# Patient Record
Sex: Female | Born: 1975 | Race: White | Hispanic: No | Marital: Single | Smoking: Never smoker
Health system: Southern US, Community
[De-identification: ages and names within clinical notes are randomized; demographics above are authoritative.]

## PROBLEM LIST (undated history)

## (undated) ENCOUNTER — Inpatient Hospital Stay (HOSPITAL_COMMUNITY): Payer: Self-pay

## (undated) DIAGNOSIS — N200 Calculus of kidney: Secondary | ICD-10-CM

## (undated) DIAGNOSIS — R3915 Urgency of urination: Secondary | ICD-10-CM

## (undated) DIAGNOSIS — E876 Hypokalemia: Secondary | ICD-10-CM

## (undated) DIAGNOSIS — Z8619 Personal history of other infectious and parasitic diseases: Secondary | ICD-10-CM

## (undated) DIAGNOSIS — R112 Nausea with vomiting, unspecified: Secondary | ICD-10-CM

## (undated) DIAGNOSIS — D649 Anemia, unspecified: Secondary | ICD-10-CM

## (undated) DIAGNOSIS — E282 Polycystic ovarian syndrome: Secondary | ICD-10-CM

## (undated) DIAGNOSIS — G43909 Migraine, unspecified, not intractable, without status migrainosus: Secondary | ICD-10-CM

## (undated) DIAGNOSIS — N201 Calculus of ureter: Secondary | ICD-10-CM

## (undated) DIAGNOSIS — F419 Anxiety disorder, unspecified: Secondary | ICD-10-CM

## (undated) DIAGNOSIS — N2589 Other disorders resulting from impaired renal tubular function: Secondary | ICD-10-CM

## (undated) DIAGNOSIS — Z87442 Personal history of urinary calculi: Secondary | ICD-10-CM

## (undated) DIAGNOSIS — R319 Hematuria, unspecified: Secondary | ICD-10-CM

## (undated) DIAGNOSIS — J189 Pneumonia, unspecified organism: Secondary | ICD-10-CM

## (undated) DIAGNOSIS — L309 Dermatitis, unspecified: Secondary | ICD-10-CM

## (undated) DIAGNOSIS — Z9889 Other specified postprocedural states: Secondary | ICD-10-CM

## (undated) DIAGNOSIS — J302 Other seasonal allergic rhinitis: Secondary | ICD-10-CM

## (undated) DIAGNOSIS — F32A Depression, unspecified: Secondary | ICD-10-CM

## (undated) HISTORY — PX: TONSILLECTOMY: SUR1361

## (undated) HISTORY — PX: CYSTOSCOPY/RETROGRADE/URETEROSCOPY/STONE EXTRACTION WITH BASKET: SHX5317

## (undated) HISTORY — DX: Depression, unspecified: F32.A

## (undated) HISTORY — DX: Personal history of other infectious and parasitic diseases: Z86.19

## (undated) HISTORY — PX: URETEROSCOPY: SHX842

---
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
## 1997-10-28 ENCOUNTER — Other Ambulatory Visit: Admission: RE | Admit: 1997-10-28 | Discharge: 1997-10-28 | Payer: Self-pay | Admitting: Obstetrics and Gynecology

## 2000-12-04 ENCOUNTER — Encounter: Admission: RE | Admit: 2000-12-04 | Discharge: 2000-12-04 | Payer: Self-pay | Admitting: Family Medicine

## 2000-12-04 ENCOUNTER — Encounter: Payer: Self-pay | Admitting: Family Medicine

## 2004-03-13 ENCOUNTER — Emergency Department (HOSPITAL_COMMUNITY): Admission: EM | Admit: 2004-03-13 | Discharge: 2004-03-13 | Payer: Self-pay | Admitting: *Deleted

## 2005-08-31 ENCOUNTER — Emergency Department (HOSPITAL_COMMUNITY): Admission: EM | Admit: 2005-08-31 | Discharge: 2005-08-31 | Payer: Self-pay | Admitting: Emergency Medicine

## 2005-09-23 ENCOUNTER — Emergency Department (HOSPITAL_COMMUNITY): Admission: EM | Admit: 2005-09-23 | Discharge: 2005-09-24 | Payer: Self-pay | Admitting: Emergency Medicine

## 2006-04-08 ENCOUNTER — Emergency Department (HOSPITAL_COMMUNITY): Admission: EM | Admit: 2006-04-08 | Discharge: 2006-04-09 | Payer: Self-pay | Admitting: Emergency Medicine

## 2006-10-29 ENCOUNTER — Encounter: Admission: RE | Admit: 2006-10-29 | Discharge: 2006-10-29 | Payer: Self-pay | Admitting: Family Medicine

## 2007-07-24 ENCOUNTER — Emergency Department (HOSPITAL_COMMUNITY): Admission: EM | Admit: 2007-07-24 | Discharge: 2007-07-25 | Payer: Self-pay | Admitting: Emergency Medicine

## 2007-08-10 ENCOUNTER — Emergency Department (HOSPITAL_COMMUNITY): Admission: EM | Admit: 2007-08-10 | Discharge: 2007-08-10 | Payer: Self-pay | Admitting: Family Medicine

## 2009-09-17 ENCOUNTER — Ambulatory Visit (HOSPITAL_COMMUNITY): Admission: RE | Admit: 2009-09-17 | Discharge: 2009-09-17 | Payer: Self-pay | Admitting: Obstetrics

## 2010-11-02 ENCOUNTER — Emergency Department (HOSPITAL_COMMUNITY)
Admission: EM | Admit: 2010-11-02 | Discharge: 2010-11-02 | Disposition: A | Discharge status: Home / Self Care | Payer: Commercial Managed Care - PPO | Attending: Emergency Medicine | Admitting: Emergency Medicine

## 2010-11-02 ENCOUNTER — Emergency Department (HOSPITAL_COMMUNITY): Payer: Commercial Managed Care - PPO

## 2010-11-02 DIAGNOSIS — R3911 Hesitancy of micturition: Secondary | ICD-10-CM | POA: Insufficient documentation

## 2010-11-02 DIAGNOSIS — R109 Unspecified abdominal pain: Secondary | ICD-10-CM | POA: Insufficient documentation

## 2010-11-02 DIAGNOSIS — N201 Calculus of ureter: Secondary | ICD-10-CM | POA: Insufficient documentation

## 2010-11-02 DIAGNOSIS — N200 Calculus of kidney: Secondary | ICD-10-CM | POA: Insufficient documentation

## 2010-11-02 DIAGNOSIS — R112 Nausea with vomiting, unspecified: Secondary | ICD-10-CM | POA: Insufficient documentation

## 2010-11-02 DIAGNOSIS — R319 Hematuria, unspecified: Secondary | ICD-10-CM | POA: Insufficient documentation

## 2010-11-02 DIAGNOSIS — R3915 Urgency of urination: Secondary | ICD-10-CM | POA: Insufficient documentation

## 2010-11-02 LAB — CBC
HCT: 37.9 % (ref 36.0–46.0)
MCV: 80 fL (ref 78.0–100.0)
Platelets: 117 10*3/uL — ABNORMAL LOW (ref 150–400)
RBC: 4.74 MIL/uL (ref 3.87–5.11)
WBC: 3 10*3/uL — ABNORMAL LOW (ref 4.0–10.5)

## 2010-11-02 LAB — URINALYSIS, ROUTINE W REFLEX MICROSCOPIC
Bilirubin Urine: NEGATIVE
Protein, ur: NEGATIVE mg/dL
Urobilinogen, UA: 0.2 mg/dL (ref 0.0–1.0)

## 2010-11-02 LAB — URINE MICROSCOPIC-ADD ON

## 2010-11-02 LAB — DIFFERENTIAL
Basophils Absolute: 0.1 10*3/uL (ref 0.0–0.1)
Eosinophils Absolute: 0.1 10*3/uL (ref 0.0–0.7)
Lymphocytes Relative: 32 % (ref 12–46)
Lymphs Abs: 1 10*3/uL (ref 0.7–4.0)
Neutrophils Relative %: 55 % (ref 43–77)

## 2010-11-02 LAB — BASIC METABOLIC PANEL
CO2: 21 mEq/L (ref 19–32)
Chloride: 110 mEq/L (ref 96–112)
Creatinine, Ser: 0.81 mg/dL (ref 0.50–1.10)
GFR calc Af Amer: >60 mL/min (ref >60)
Potassium: 3.5 mEq/L (ref 3.5–5.1)

## 2011-01-17 ENCOUNTER — Emergency Department (HOSPITAL_COMMUNITY)
Admission: EM | Admit: 2011-01-17 | Discharge: 2011-01-17 | Disposition: A | Discharge status: Home / Self Care | Payer: Commercial Managed Care - PPO | Attending: Emergency Medicine | Admitting: Emergency Medicine

## 2011-01-17 DIAGNOSIS — R319 Hematuria, unspecified: Secondary | ICD-10-CM | POA: Insufficient documentation

## 2011-01-17 DIAGNOSIS — Z87442 Personal history of urinary calculi: Secondary | ICD-10-CM | POA: Insufficient documentation

## 2011-01-17 DIAGNOSIS — N390 Urinary tract infection, site not specified: Secondary | ICD-10-CM | POA: Insufficient documentation

## 2011-01-17 DIAGNOSIS — R109 Unspecified abdominal pain: Secondary | ICD-10-CM | POA: Insufficient documentation

## 2011-01-17 LAB — URINALYSIS, ROUTINE W REFLEX MICROSCOPIC
Glucose, UA: NEGATIVE mg/dL
Ketones, ur: NEGATIVE mg/dL
Nitrite: NEGATIVE
Specific Gravity, Urine: 1.02 (ref 1.005–1.030)
pH: 6.5 (ref 5.0–8.0)

## 2011-01-17 LAB — URINE MICROSCOPIC-ADD ON

## 2011-05-02 NOTE — L&D Delivery Note (Addendum)
Delivery Note At 6:58 PM a viable and healthy female was delivered via Vaginal, Spontaneous Delivery (Presentation: Left Occiput Anterior).  APGAR: 8, 9; weight 7 lbs 9 oz .   Placenta status: Intact, Spontaneous.  Cord: 3 vessels.  Anesthesia: Epidural  Episiotomy: None Lacerations: 1st degree, no sutures necessary Est. Blood Loss (mL): 400  Mom to postpartum.  Baby to nursery-stable.  Phyllis House D 02/12/2012, 7:32 PM

## 2011-06-08 ENCOUNTER — Encounter (HOSPITAL_COMMUNITY): Payer: Self-pay | Admitting: *Deleted

## 2011-06-08 ENCOUNTER — Inpatient Hospital Stay (HOSPITAL_COMMUNITY)
Admission: AD | Admit: 2011-06-08 | Discharge: 2011-06-08 | Disposition: A | Discharge status: Home / Self Care | Payer: Medicaid Other | Source: Ambulatory Visit | Attending: Obstetrics & Gynecology | Admitting: Obstetrics & Gynecology

## 2011-06-08 DIAGNOSIS — R030 Elevated blood-pressure reading, without diagnosis of hypertension: Secondary | ICD-10-CM | POA: Insufficient documentation

## 2011-06-08 DIAGNOSIS — O99891 Other specified diseases and conditions complicating pregnancy: Secondary | ICD-10-CM | POA: Insufficient documentation

## 2011-06-08 NOTE — Progress Notes (Signed)
Needs confirmation of pregnancy, no problems.

## 2011-06-08 NOTE — ED Provider Notes (Signed)
Phyllis House is a 36 y.o. female who presents to MAU for pregnancy verification. She denies vaginal bleeding, discharge or pain.   Assessment: Elevated blood pressure   Pregnancy  Plan:     Patient to start prenatal care   Return here as needed for GYN problems.  [place], [place] 06/08/11 [phone]

## 2011-06-12 NOTE — ED Provider Notes (Signed)
Attestation of Attending Supervision of Advanced Practitioner: Evaluation and management procedures were performed by the PA/NP/CNM/OB Fellow under my supervision/collaboration. Chart reviewed, and agree with management and plan.  Jaynie Collins, M.D. 06/12/2011 1:41 PM

## 2011-06-22 LAB — OB RESULTS CONSOLE HEPATITIS B SURFACE ANTIGEN: Hepatitis B Surface Ag: NEGATIVE

## 2011-06-22 LAB — OB RESULTS CONSOLE ANTIBODY SCREEN: Antibody Screen: NEGATIVE

## 2011-06-22 LAB — OB RESULTS CONSOLE RUBELLA ANTIBODY, IGM: Rubella: IMMUNE

## 2011-06-22 LAB — OB RESULTS CONSOLE ABO/RH: RH Type: POSITIVE

## 2011-08-08 ENCOUNTER — Encounter (HOSPITAL_COMMUNITY): Payer: Self-pay | Admitting: Emergency Medicine

## 2011-08-08 ENCOUNTER — Emergency Department (HOSPITAL_COMMUNITY)
Admission: EM | Admit: 2011-08-08 | Discharge: 2011-08-08 | Disposition: A | Discharge status: Home / Self Care | Payer: Medicaid Other | Attending: Emergency Medicine | Admitting: Emergency Medicine

## 2011-08-08 DIAGNOSIS — R109 Unspecified abdominal pain: Secondary | ICD-10-CM | POA: Insufficient documentation

## 2011-08-08 DIAGNOSIS — R319 Hematuria, unspecified: Secondary | ICD-10-CM | POA: Insufficient documentation

## 2011-08-08 DIAGNOSIS — R112 Nausea with vomiting, unspecified: Secondary | ICD-10-CM | POA: Insufficient documentation

## 2011-08-08 DIAGNOSIS — O9989 Other specified diseases and conditions complicating pregnancy, childbirth and the puerperium: Secondary | ICD-10-CM | POA: Insufficient documentation

## 2011-08-08 LAB — URINALYSIS, ROUTINE W REFLEX MICROSCOPIC
Glucose, UA: NEGATIVE mg/dL
Nitrite: NEGATIVE
Protein, ur: NEGATIVE mg/dL

## 2011-08-08 LAB — CBC
HCT: 35.7 % — ABNORMAL LOW (ref 36.0–46.0)
Hemoglobin: 12.5 g/dL (ref 12.0–15.0)
MCH: 28.3 pg (ref 26.0–34.0)
MCHC: 35 g/dL (ref 30.0–36.0)
MCV: 80.8 fL (ref 78.0–100.0)
RBC: 4.42 MIL/uL (ref 3.87–5.11)

## 2011-08-08 LAB — BASIC METABOLIC PANEL
CO2: 19 mEq/L (ref 19–32)
Calcium: 9.2 mg/dL (ref 8.4–10.5)
Chloride: 105 mEq/L (ref 96–112)
Sodium: 135 mEq/L (ref 135–145)

## 2011-08-08 LAB — DIFFERENTIAL
Basophils Absolute: 0.1 10*3/uL (ref 0.0–0.1)
Eosinophils Relative: 2 % (ref 0–5)
Neutrophils Relative %: 56 % (ref 43–77)

## 2011-08-08 LAB — URINE MICROSCOPIC-ADD ON

## 2011-08-08 MED ORDER — MORPHINE SULFATE 4 MG/ML IJ SOLN
INTRAMUSCULAR | Status: AC
  Administered 2011-08-08: 4 mg via INTRAVENOUS
  Filled 2011-08-08: qty 1

## 2011-08-08 MED ORDER — ONDANSETRON HCL 4 MG/2ML IJ SOLN
INTRAMUSCULAR | Status: AC
  Administered 2011-08-08: 4 mg via INTRAVENOUS
  Filled 2011-08-08: qty 2

## 2011-08-08 MED ORDER — MORPHINE SULFATE 4 MG/ML IJ SOLN
4.0000 mg | Freq: Once | INTRAMUSCULAR | Status: AC
  Administered 2011-08-08: 4 mg via INTRAVENOUS
  Filled 2011-08-08: qty 1

## 2011-08-08 MED ORDER — MORPHINE SULFATE 4 MG/ML IJ SOLN
4.0000 mg | Freq: Once | INTRAMUSCULAR | Status: AC
  Administered 2011-08-08: 4 mg via INTRAVENOUS

## 2011-08-08 MED ORDER — SODIUM CHLORIDE 0.9 % IV SOLN
INTRAVENOUS | Status: DC
  Administered 2011-08-08: 21:00:00 via INTRAVENOUS

## 2011-08-08 MED ORDER — ONDANSETRON HCL 4 MG/2ML IJ SOLN
4.0000 mg | Freq: Once | INTRAMUSCULAR | Status: AC
  Administered 2011-08-08: 4 mg via INTRAVENOUS

## 2011-08-08 MED ORDER — PROMETHAZINE HCL 25 MG PO TABS: 25.0000 mg | ORAL_TABLET | Freq: Four times a day (QID) | ORAL | Status: DC | PRN

## 2011-08-08 MED ORDER — OXYCODONE-ACETAMINOPHEN 5-325 MG PO TABS: 2.0000 | ORAL_TABLET | ORAL | Status: AC | PRN

## 2011-08-08 NOTE — Discharge Instructions (Signed)
Use Percocet for pain, and Phenergan for nausea and vomiting.  Followup with your obstetrician, for reevaluation.  Return for worse or uncontrolled symptoms

## 2011-08-08 NOTE — ED Notes (Signed)
[redacted] weeks pregnant, G2, P1.  Pt reports significant h/o kidney stones, states right flank pain began 1 week ago, can usually pass stone but pain and nausea became untolerable tonight. Pt with several episodes of emesis prior to and after arrival to ED.

## 2011-08-08 NOTE — ED Notes (Signed)
Pt aware of need for urine specimen.  Unable to obtain at this time. 

## 2011-08-08 NOTE — ED Provider Notes (Signed)
History     CSN: [id number]  Arrival date & time 08/08/11  2011   First MD Initiated Contact with Patient 08/08/11 2112      Chief Complaint  Patient presents with  . Flank Pain    (Consider location/radiation/quality/duration/timing/severity/associated sxs/prior treatment) Patient is a 36 y.o. female presenting with flank pain. The history is provided by the patient.  Flank Pain Associated symptoms include abdominal pain. Pertinent negatives include no headaches.   the patient is a 31, female, with a history of multiple kidney stones, who presents to emergency department with right flank pain, radiating into her right lower quadrant, associated with nausea and vomiting.  She is [redacted] weeks pregnant.  She denies fever, dysuria, or hematuria.  She denies any other medical problems.  She called her obstetrician, who told her to the emergency department for further evaluation.  Level V caveat applies for urgent need for intervention, due to severe pain  Past Medical History  Diagnosis Date  . Kidney stones     History reviewed. No pertinent past surgical history.  History reviewed. No pertinent family history.  History  Substance Use Topics  . Smoking status: Never Smoker   . Smokeless tobacco: Not on file  . Alcohol Use: No    OB History    Grav Para Term Preterm Abortions TAB SAB Ect Mult Living   1               Review of Systems  Constitutional: Negative for fever.  Gastrointestinal: Positive for nausea, vomiting and abdominal pain.  Genitourinary: Positive for flank pain. Negative for dysuria and hematuria.  Neurological: Negative for headaches.  Psychiatric/Behavioral: Negative for confusion.  All other systems reviewed and are negative.    Allergies  Review of patient's allergies indicates no known allergies.  Home Medications   Current Outpatient Rx  Name Route Sig Dispense Refill  . DOCUSATE SODIUM 100 MG PO CAPS Oral Take 100 mg by mouth as needed.  Constipation    . ONDANSETRON HCL 4 MG PO TABS Oral Take 4 mg by mouth every 8 (eight) hours as needed. Morning sickness.    Marland Kitchen PRENATAL MULTIVITAMIN CH Oral Take 1 tablet by mouth daily.      BP 143/80  Pulse 70  Temp(Src) 98.3 F (36.8 C) (Oral)  Resp 20  SpO2 100%  LMP 05/04/2011  Physical Exam  Vitals reviewed. Constitutional: She is oriented to person, place, and time. She appears well-developed and well-nourished.       Examination performed after the patient had been medicated with morphine and Zofran  HENT:  Head: Normocephalic and atraumatic.  Eyes: EOM are normal.  Neck: Normal range of motion. Neck supple.  Cardiovascular: Normal rate.   Pulmonary/Chest: Effort normal. No respiratory distress.  Abdominal: Soft. There is tenderness. There is no rebound and no guarding.       Mild right lower quadrant tenderness, with no peritoneal signs  Musculoskeletal: Normal range of motion.  Neurological: She is alert and oriented to person, place, and time.  Skin: Skin is warm and dry.  Psychiatric: She has a normal mood and affect. Thought content normal.    ED Course  Procedures (including critical care time) 36 year old pregnant female, with history of kidney stones, presents to emergency department with symptoms consistent with a recurrent kidney stone.  We will establish an IV treat her symptoms and perform blood and urine tests.  Course a CAT scan cannot be performed because of her pregnancy.  Labs  Reviewed  CBC - Abnormal; Notable for the following:    HCT 35.7 (*)    Platelets 142 (*)    All other components within normal limits  DIFFERENTIAL  BASIC METABOLIC PANEL  URINALYSIS, ROUTINE W REFLEX MICROSCOPIC   No results found.   No diagnosis found.  11:20 PM Pain resolved  MDM  Right flank pain, with nausea and vomiting in the setting of pregnancy, and history of kidney stones prob kidney stone, but can't do ct since pregnant. sxs resolved in  ed        Cheri Guppy, MD 08/08/11 2320

## 2011-08-08 NOTE — ED Notes (Signed)
Pt passed large brown kidney stone, states relief.

## 2012-01-08 LAB — OB RESULTS CONSOLE GBS: GBS: NEGATIVE

## 2012-01-19 ENCOUNTER — Telehealth (HOSPITAL_COMMUNITY): Payer: Self-pay | Admitting: *Deleted

## 2012-01-19 ENCOUNTER — Encounter (HOSPITAL_COMMUNITY): Payer: Self-pay | Admitting: *Deleted

## 2012-01-19 NOTE — Telephone Encounter (Signed)
Preadmission screen  

## 2012-02-03 ENCOUNTER — Inpatient Hospital Stay (HOSPITAL_COMMUNITY)
Admission: AD | Admit: 2012-02-03 | Discharge: 2012-02-04 | DRG: 782 | Disposition: A | Discharge status: Home / Self Care | Payer: Medicaid Other | Source: Ambulatory Visit | Attending: Obstetrics and Gynecology | Admitting: Obstetrics and Gynecology

## 2012-02-03 ENCOUNTER — Encounter (HOSPITAL_COMMUNITY): Payer: Self-pay | Admitting: *Deleted

## 2012-02-03 ENCOUNTER — Inpatient Hospital Stay (HOSPITAL_COMMUNITY): Payer: Medicaid Other

## 2012-02-03 DIAGNOSIS — O09529 Supervision of elderly multigravida, unspecified trimester: Secondary | ICD-10-CM | POA: Diagnosis present

## 2012-02-03 DIAGNOSIS — O368190 Decreased fetal movements, unspecified trimester, not applicable or unspecified: Principal | ICD-10-CM | POA: Diagnosis present

## 2012-02-03 DIAGNOSIS — O479 False labor, unspecified: Secondary | ICD-10-CM | POA: Diagnosis present

## 2012-02-03 NOTE — MAU Note (Signed)
Pt presents for decreased fetal movement throughout the day.  She has been having some "belly tightening" throughout the day, and not felt much movement but when she went to lay down for bed she noticed she hadn't felt the baby move today and could not do anything to stimulate movement.

## 2012-02-04 ENCOUNTER — Encounter (HOSPITAL_COMMUNITY): Payer: Self-pay | Admitting: *Deleted

## 2012-02-04 LAB — COMPREHENSIVE METABOLIC PANEL
ALT: 13 U/L (ref 0–35)
Alkaline Phosphatase: 107 U/L (ref 39–117)
BUN: 11 mg/dL (ref 6–23)
CO2: 11 mEq/L — ABNORMAL LOW (ref 19–32)
Calcium: 9 mg/dL (ref 8.4–10.5)
GFR calc Af Amer: >90 mL/min (ref >90)
GFR calc non Af Amer: >90 mL/min (ref >90)
Glucose, Bld: 88 mg/dL (ref 70–99)
Sodium: 133 mEq/L — ABNORMAL LOW (ref 135–145)

## 2012-02-04 LAB — CBC
HCT: 39.3 % (ref 36.0–46.0)
Hemoglobin: 13.1 g/dL (ref 12.0–15.0)
MCH: 25.7 pg — ABNORMAL LOW (ref 26.0–34.0)
MCV: 77.2 fL — ABNORMAL LOW (ref 78.0–100.0)
MCV: 77 fL — ABNORMAL LOW (ref 78.0–100.0)
Platelets: 139 10*3/uL — ABNORMAL LOW (ref 150–400)
RBC: 5.09 MIL/uL (ref 3.87–5.11)
RBC: 5.17 MIL/uL — ABNORMAL HIGH (ref 3.87–5.11)
WBC: 5.9 10*3/uL (ref 4.0–10.5)

## 2012-02-04 LAB — URINALYSIS, ROUTINE W REFLEX MICROSCOPIC
Bilirubin Urine: NEGATIVE
Ketones, ur: NEGATIVE mg/dL
Nitrite: NEGATIVE
Nitrite: NEGATIVE
Protein, ur: 30 mg/dL — AB
Specific Gravity, Urine: 1.01 (ref 1.005–1.030)
Urobilinogen, UA: 0.2 mg/dL (ref 0.0–1.0)
Urobilinogen, UA: 0.2 mg/dL (ref 0.0–1.0)

## 2012-02-04 LAB — URINE MICROSCOPIC-ADD ON

## 2012-02-04 LAB — RPR: RPR Ser Ql: NONREACTIVE

## 2012-02-04 MED ORDER — OXYTOCIN BOLUS FROM INFUSION
500.0000 mL | Freq: Once | INTRAVENOUS | Status: DC
  Filled 2012-02-04: qty 500

## 2012-02-04 MED ORDER — ACETAMINOPHEN 325 MG PO TABS: 650.0000 mg | ORAL_TABLET | ORAL | Status: DC | PRN

## 2012-02-04 MED ORDER — CITRIC ACID-SODIUM CITRATE 334-500 MG/5ML PO SOLN: 30.0000 mL | ORAL | Status: DC | PRN

## 2012-02-04 MED ORDER — OXYTOCIN 40 UNITS IN LACTATED RINGERS INFUSION - SIMPLE MED: 62.5000 mL/h | Freq: Once | INTRAVENOUS | Status: DC

## 2012-02-04 MED ORDER — OXYCODONE-ACETAMINOPHEN 5-325 MG PO TABS: 1.0000 | ORAL_TABLET | ORAL | Status: DC | PRN

## 2012-02-04 MED ORDER — LACTATED RINGERS IV SOLN: INTRAVENOUS | Status: DC

## 2012-02-04 MED ORDER — ONDANSETRON HCL 4 MG/2ML IJ SOLN: 4.0000 mg | Freq: Four times a day (QID) | INTRAMUSCULAR | Status: DC | PRN

## 2012-02-04 MED ORDER — LIDOCAINE HCL (PF) 1 % IJ SOLN: 30.0000 mL | INTRAMUSCULAR | Status: DC | PRN

## 2012-02-04 MED ORDER — LACTATED RINGERS IV SOLN: 500.0000 mL | INTRAVENOUS | Status: DC | PRN

## 2012-02-04 MED ORDER — IBUPROFEN 600 MG PO TABS: 600.0000 mg | ORAL_TABLET | Freq: Four times a day (QID) | ORAL | Status: DC | PRN

## 2012-02-04 NOTE — MAU Note (Signed)
Informed Dr. Henderson Cloud of lab results, cervix 4/60/-2, received admit orders.

## 2012-02-04 NOTE — Progress Notes (Signed)
Pt comfortable o/n.  Filed Vitals:   02/03/12 2254 02/03/12 2300 02/04/12 0141 02/04/12 0307  BP: 138/92 136/93 120/84 130/79  Pulse: 78 89 94 73  Temp:    97.8 F (36.6 C)  TempSrc:    Oral  Resp:   18 20  Height:    5\' 2"  (1.575 m)  Weight:    85.276 kg (188 lb)   FHTs 120s gstv, NST R  Toco spaced out  UA proteinuria 30- clean catch, appears contaminated.  Labs otherwise normal- plts 139  A/p  Pt admitted at [redacted]w[redacted]d for presumed labor and mild preeclampsia.  BPS have been mostly below threshold.  Will recheck urine with cath specimen since pt currently does not appear to be in labor.

## 2012-02-04 NOTE — ED Provider Notes (Signed)
Seen by Dr Henderson Cloud.  BPP 8/8.  AFI normal.  Plan per Dr Henderson Cloud.

## 2012-02-04 NOTE — Progress Notes (Signed)
Spoke with Dr. Henderson Cloud regarding urine results. Order given to dc patient home.

## 2012-02-04 NOTE — Progress Notes (Signed)
Baby active 

## 2012-02-04 NOTE — MAU Note (Signed)
Notified MD of BP readings received order for [facility]

## 2012-02-04 NOTE — H&P (Signed)
36 y.o. [redacted]w[redacted]d  G2P1001 comes in c/o decreased FM- was unable to get adequate counts earlier.  Initially did not c/o any CTXes but appears to be having them now.  Did not c/o bleeding but bleeding is now occuring after spec exam.  Pt also has BPs elevated to 130s/90s.  She c/o some wetness of her panties on way back to MAU.  Past Medical History  Diagnosis Date  . Kidney stones   . Trichomonas   . Herpesvirus 2   . H/O varicella   . Depression     PP  . Kidney stones   . AMA (advanced maternal age) multigravida 35+     Past Surgical History  Procedure Date  . Kidney stone surgery     OB History    Grav Para Term Preterm Abortions TAB SAB Ect Mult Living   2 1 1       1      # Outc Date GA Lbr Len/2nd Wgt Sex Del Anes PTL Lv   1 TRM 1995 [redacted]w[redacted]d 09:00 7lb1oz(3.204kg) F SVD EPI  Yes   2 CUR               History   Social History  . Marital Status: Divorced    Spouse Name: N/A    Number of Children: N/A  . Years of Education: N/A   Occupational History  . Not on file.   Social History Main Topics  . Smoking status: Never Smoker   . Smokeless tobacco: Never Used  . Alcohol Use: No  . Drug Use: No  . Sexually Active: Yes   Other Topics Concern  . Not on file   Social History Narrative  . No narrative on file   Review of patient's allergies indicates no known allergies.   Prenatal Course:  Uncomplicated except for hx HSV; pt has been on Valtrex for 2 weeks and has no c/o sx or aura.  Filed Vitals:   02/03/12 2300  BP: 136/93  Pulse: 89  Temp:   Resp:      Lungs/Cor:  NAD Abdomen:  soft, gravid Ex:  no cords, erythema SVE:  3/50/-2, VTX, friable cervix with some bleeding from cervix not uterus, Ballotable SSE unable to eval fern, no pooling except for some blood, no lesions of HSV seen in or out of vagina. FHTs:  120s, good STV, NST R Toco:  q3-5 but now irreg  U/S  BPP 8/8, AFI 15.3, VTX   A/P   G2P1001 [redacted]w[redacted]d  1.  Decrease FM- BPP and NST reassuring for  fetal well being.  Pt feels movement now. 2.  No s/s ROM- baby is still ballotable w/o fluid leakage.  AFI was excellent. 3.  Increased BPs- waiting on urine for protein analysis.  Will do labs if proteinuria. 4.  Possible labor- No s/s HSV outbreak, ok for vaginal delivery if changes cervix.  GBS neg.  Deberah Adolf A

## 2012-02-11 ENCOUNTER — Encounter (HOSPITAL_COMMUNITY): Payer: Self-pay | Admitting: *Deleted

## 2012-02-11 ENCOUNTER — Inpatient Hospital Stay (HOSPITAL_COMMUNITY)
Admission: AD | Admit: 2012-02-11 | Discharge: 2012-02-12 | Disposition: A | Discharge status: Hospice | Payer: Medicaid Other | Source: Ambulatory Visit | Attending: Obstetrics and Gynecology | Admitting: Obstetrics and Gynecology

## 2012-02-11 DIAGNOSIS — O479 False labor, unspecified: Secondary | ICD-10-CM | POA: Insufficient documentation

## 2012-02-11 NOTE — Progress Notes (Signed)
Dr Claiborne Billings notified of patients vag exam, contraction pattern, FHR pattern, orders received

## 2012-02-11 NOTE — MAU Note (Signed)
cotractions 

## 2012-02-11 NOTE — MAU Note (Signed)
Pt given the option to ambulate for an hour and recheck cervix or go home until contractions become stronger. Pt chooses to ambulate and recheck cervix.

## 2012-02-12 ENCOUNTER — Inpatient Hospital Stay (HOSPITAL_COMMUNITY)
Admission: AD | Admit: 2012-02-12 | Discharge: 2012-02-14 | DRG: 775 | Disposition: A | Discharge status: Home / Self Care | Payer: Medicaid Other | Source: Ambulatory Visit | Attending: Obstetrics and Gynecology | Admitting: Obstetrics and Gynecology

## 2012-02-12 ENCOUNTER — Inpatient Hospital Stay (HOSPITAL_COMMUNITY): Payer: Medicaid Other | Admitting: Anesthesiology

## 2012-02-12 ENCOUNTER — Encounter (HOSPITAL_COMMUNITY): Payer: Self-pay | Admitting: Anesthesiology

## 2012-02-12 ENCOUNTER — Encounter (HOSPITAL_COMMUNITY): Payer: Self-pay | Admitting: *Deleted

## 2012-02-12 DIAGNOSIS — O09529 Supervision of elderly multigravida, unspecified trimester: Principal | ICD-10-CM | POA: Diagnosis present

## 2012-02-12 LAB — CBC
Hemoglobin: 13.8 g/dL (ref 12.0–15.0)
MCH: 26.1 pg (ref 26.0–34.0)
MCH: 26.2 pg (ref 26.0–34.0)
MCHC: 33.5 g/dL (ref 30.0–36.0)
MCV: 78.1 fL (ref 78.0–100.0)
MCV: 78.3 fL (ref 78.0–100.0)
Platelets: 109 10*3/uL — ABNORMAL LOW (ref 150–400)
RBC: 5.29 MIL/uL — ABNORMAL HIGH (ref 3.87–5.11)
RDW: 15.8 % — ABNORMAL HIGH (ref 11.5–15.5)
WBC: 7.4 10*3/uL (ref 4.0–10.5)

## 2012-02-12 MED ORDER — LIDOCAINE HCL (PF) 1 % IJ SOLN
INTRAMUSCULAR | Status: DC | PRN
  Administered 2012-02-12 (×2): 9 mL

## 2012-02-12 MED ORDER — OXYTOCIN 40 UNITS IN LACTATED RINGERS INFUSION - SIMPLE MED
62.5000 mL/h | Freq: Once | INTRAVENOUS | Status: DC
Start: 2012-02-12 — End: 2012-02-12
  Filled 2012-02-12: qty 1000

## 2012-02-12 MED ORDER — ONDANSETRON HCL 4 MG/2ML IJ SOLN: 4.0000 mg | INTRAMUSCULAR | Status: DC | PRN

## 2012-02-12 MED ORDER — LACTATED RINGERS IV SOLN
INTRAVENOUS | Status: DC
  Administered 2012-02-12: 16:00:00 via INTRAVENOUS

## 2012-02-12 MED ORDER — DIBUCAINE 1 % RE OINT: 1.0000 "application " | TOPICAL_OINTMENT | RECTAL | Status: DC | PRN

## 2012-02-12 MED ORDER — DIPHENHYDRAMINE HCL 25 MG PO CAPS: 25.0000 mg | ORAL_CAPSULE | Freq: Four times a day (QID) | ORAL | Status: DC | PRN

## 2012-02-12 MED ORDER — DIPHENHYDRAMINE HCL 50 MG/ML IJ SOLN: 12.5000 mg | INTRAMUSCULAR | Status: DC | PRN

## 2012-02-12 MED ORDER — IBUPROFEN 600 MG PO TABS
600.0000 mg | ORAL_TABLET | Freq: Four times a day (QID) | ORAL | Status: DC
  Administered 2012-02-13 – 2012-02-14 (×7): 600 mg via ORAL
  Filled 2012-02-12 (×7): qty 1

## 2012-02-12 MED ORDER — FENTANYL 2.5 MCG/ML BUPIVACAINE 1/10 % EPIDURAL INFUSION (WH - ANES)
INTRAMUSCULAR | Status: AC
  Filled 2012-02-12: qty 125

## 2012-02-12 MED ORDER — ONDANSETRON HCL 4 MG/2ML IJ SOLN: 4.0000 mg | Freq: Four times a day (QID) | INTRAMUSCULAR | Status: DC | PRN

## 2012-02-12 MED ORDER — BUTORPHANOL TARTRATE 1 MG/ML IJ SOLN: 1.0000 mg | INTRAMUSCULAR | Status: DC | PRN

## 2012-02-12 MED ORDER — OXYTOCIN BOLUS FROM INFUSION
500.0000 mL | Freq: Once | INTRAVENOUS | Status: AC
  Administered 2012-02-12: 500 mL via INTRAVENOUS
  Filled 2012-02-12: qty 500

## 2012-02-12 MED ORDER — LACTATED RINGERS IV SOLN
500.0000 mL | INTRAVENOUS | Status: DC | PRN
  Administered 2012-02-12: 500 mL via INTRAVENOUS

## 2012-02-12 MED ORDER — SIMETHICONE 80 MG PO CHEW: 80.0000 mg | CHEWABLE_TABLET | ORAL | Status: DC | PRN

## 2012-02-12 MED ORDER — BENZOCAINE-MENTHOL 20-0.5 % EX AERO
1.0000 "application " | INHALATION_SPRAY | CUTANEOUS | Status: DC | PRN
  Administered 2012-02-12: 1 via TOPICAL
  Filled 2012-02-12: qty 56

## 2012-02-12 MED ORDER — EPHEDRINE 5 MG/ML INJ
10.0000 mg | INTRAVENOUS | Status: DC | PRN
  Filled 2012-02-12: qty 4

## 2012-02-12 MED ORDER — EPHEDRINE 5 MG/ML INJ: 10.0000 mg | INTRAVENOUS | Status: DC | PRN

## 2012-02-12 MED ORDER — FENTANYL 2.5 MCG/ML BUPIVACAINE 1/10 % EPIDURAL INFUSION (WH - ANES)
INTRAMUSCULAR | Status: DC | PRN
  Administered 2012-02-12: 14 mL/h via EPIDURAL

## 2012-02-12 MED ORDER — PHENYLEPHRINE 40 MCG/ML (10ML) SYRINGE FOR IV PUSH (FOR BLOOD PRESSURE SUPPORT): 80.0000 ug | PREFILLED_SYRINGE | INTRAVENOUS | Status: DC | PRN

## 2012-02-12 MED ORDER — TETANUS-DIPHTH-ACELL PERTUSSIS 5-2.5-18.5 LF-MCG/0.5 IM SUSP
0.5000 mL | Freq: Once | INTRAMUSCULAR | Status: AC
  Administered 2012-02-13: 0.5 mL via INTRAMUSCULAR
  Filled 2012-02-12: qty 0.5

## 2012-02-12 MED ORDER — FENTANYL 2.5 MCG/ML BUPIVACAINE 1/10 % EPIDURAL INFUSION (WH - ANES): 14.0000 mL/h | INTRAMUSCULAR | Status: DC

## 2012-02-12 MED ORDER — WITCH HAZEL-GLYCERIN EX PADS: 1.0000 "application " | MEDICATED_PAD | CUTANEOUS | Status: DC | PRN

## 2012-02-12 MED ORDER — LACTATED RINGERS IV SOLN: 500.0000 mL | Freq: Once | INTRAVENOUS | Status: DC

## 2012-02-12 MED ORDER — ONDANSETRON HCL 4 MG PO TABS: 4.0000 mg | ORAL_TABLET | ORAL | Status: DC | PRN

## 2012-02-12 MED ORDER — ZOLPIDEM TARTRATE 5 MG PO TABS: 5.0000 mg | ORAL_TABLET | Freq: Every evening | ORAL | Status: DC | PRN

## 2012-02-12 MED ORDER — ACETAMINOPHEN 325 MG PO TABS
650.0000 mg | ORAL_TABLET | ORAL | Status: DC | PRN
Start: 2012-02-12 — End: 2012-02-12

## 2012-02-12 MED ORDER — LANOLIN HYDROUS EX OINT: TOPICAL_OINTMENT | CUTANEOUS | Status: DC | PRN

## 2012-02-12 MED ORDER — SENNOSIDES-DOCUSATE SODIUM 8.6-50 MG PO TABS
2.0000 | ORAL_TABLET | Freq: Every day | ORAL | Status: DC
  Administered 2012-02-12 – 2012-02-13 (×2): 2 via ORAL

## 2012-02-12 MED ORDER — IBUPROFEN 600 MG PO TABS: 600.0000 mg | ORAL_TABLET | Freq: Four times a day (QID) | ORAL | Status: DC | PRN

## 2012-02-12 MED ORDER — OXYCODONE-ACETAMINOPHEN 5-325 MG PO TABS: 1.0000 | ORAL_TABLET | ORAL | Status: DC | PRN

## 2012-02-12 MED ORDER — PRENATAL MULTIVITAMIN CH
1.0000 | ORAL_TABLET | Freq: Every day | ORAL | Status: DC
  Administered 2012-02-13 – 2012-02-14 (×2): 1 via ORAL
  Filled 2012-02-12 (×3): qty 1

## 2012-02-12 MED ORDER — ZOLPIDEM TARTRATE 5 MG PO TABS
5.0000 mg | ORAL_TABLET | Freq: Once | ORAL | Status: AC
  Administered 2012-02-12: 5 mg via ORAL
  Filled 2012-02-12: qty 1

## 2012-02-12 MED ORDER — LIDOCAINE HCL (PF) 1 % IJ SOLN: 30.0000 mL | INTRAMUSCULAR | Status: DC | PRN

## 2012-02-12 MED ORDER — OXYCODONE-ACETAMINOPHEN 5-325 MG PO TABS
1.0000 | ORAL_TABLET | ORAL | Status: DC | PRN
  Administered 2012-02-12 – 2012-02-13 (×4): 1 via ORAL
  Filled 2012-02-12 (×4): qty 1

## 2012-02-12 MED ORDER — CITRIC ACID-SODIUM CITRATE 334-500 MG/5ML PO SOLN: 30.0000 mL | ORAL | Status: DC | PRN

## 2012-02-12 MED ORDER — PHENYLEPHRINE 40 MCG/ML (10ML) SYRINGE FOR IV PUSH (FOR BLOOD PRESSURE SUPPORT)
PREFILLED_SYRINGE | INTRAVENOUS | Status: AC
  Filled 2012-02-12: qty 5

## 2012-02-12 NOTE — H&P (Signed)
36 y.o. G2P1001  Estimated Date of Delivery: 02/16/12 admitted at 39/[redacted] weeks gestation in labor.  Prenatal Transfer Tool  Maternal Diabetes: No Genetic Screening: Normal Maternal Ultrasounds/Referrals: Normal Fetal Ultrasounds or other Referrals:  None Maternal Substance Abuse:  No Significant Maternal Medications: Valtrex for HSV 2 prophylaxis Significant Maternal Lab Results: None Other Significant Pregnancy Complications:  None  Afebrile, VSS Heart and Lungs: No active disease Abdomen: soft, gravid, EFW AGA. Cervical exam:  5/80, vtx -2  Impression: Term pregnancy, early labor  Plan:  TOL

## 2012-02-12 NOTE — Anesthesia Procedure Notes (Signed)
Epidural Patient location during procedure: OB Start time: 02/12/2012 3:57 PM End time: 02/12/2012 4:01 PM  Staffing Anesthesiologist: Sandrea Hughs Performed by: anesthesiologist   Preanesthetic Checklist Completed: patient identified, site marked, surgical consent, pre-op evaluation, timeout performed, IV checked, risks and benefits discussed and monitors and equipment checked  Epidural Patient position: sitting Prep: site prepped and draped and DuraPrep Patient monitoring: continuous pulse ox and blood pressure Approach: midline Injection technique: LOR air  Needle:  Needle type: Tuohy  Needle gauge: 17 G Needle length: 9 cm and 9 Needle insertion depth: 6 cm Catheter type: closed end flexible Catheter size: 19 Gauge Catheter at skin depth: 11 cm Test dose: negative and Other  Assessment Sensory level: T8 Events: blood not aspirated, injection not painful, no injection resistance, negative IV test and no paresthesia  Additional Notes Reason for block:procedure for pain

## 2012-02-12 NOTE — Anesthesia Preprocedure Evaluation (Signed)
Anesthesia Evaluation  Patient identified by MRN, date of birth, ID band Patient awake    Reviewed: Allergy & Precautions, H&P , NPO status , Patient's Chart, lab work & pertinent test results  Airway Mallampati: II TM Distance: >3 FB Neck ROM: full    Dental No notable dental hx.    Pulmonary neg pulmonary ROS,    Pulmonary exam normal       Cardiovascular negative cardio ROS      Neuro/Psych Depression negative neurological ROS     GI/Hepatic Neg liver ROS,   Endo/Other  Morbid obesity  Renal/GU Renal disease  negative genitourinary   Musculoskeletal negative musculoskeletal ROS (+)   Abdominal Normal abdominal exam  (+)   Peds negative pediatric ROS (+)  Hematology negative hematology ROS (+)   Anesthesia Other Findings   Reproductive/Obstetrics (+) Pregnancy                           Anesthesia Physical Anesthesia Plan  ASA: III  Anesthesia Plan: Epidural   Post-op Pain Management:    Induction:   Airway Management Planned:   Additional Equipment:   Intra-op Plan:   Post-operative Plan:   Informed Consent: I have reviewed the patients History and Physical, chart, labs and discussed the procedure including the risks, benefits and alternatives for the proposed anesthesia with the patient or authorized representative who has indicated his/her understanding and acceptance.     Plan Discussed with:   Anesthesia Plan Comments:         Anesthesia Quick Evaluation

## 2012-02-13 LAB — CBC
HCT: 32.9 % — ABNORMAL LOW (ref 36.0–46.0)
MCV: 78.3 fL (ref 78.0–100.0)
RBC: 4.2 MIL/uL (ref 3.87–5.11)
WBC: 7.8 10*3/uL (ref 4.0–10.5)

## 2012-02-13 MED ORDER — INFLUENZA VIRUS VACC SPLIT PF IM SUSP
0.5000 mL | INTRAMUSCULAR | Status: AC
  Administered 2012-02-14: 0.5 mL via INTRAMUSCULAR
  Filled 2012-02-13: qty 0.5

## 2012-02-13 NOTE — Anesthesia Postprocedure Evaluation (Signed)
  Anesthesia Post-op Note  Patient: Phyllis House  Procedure(s) Performed: * No procedures listed *  Patient Location: Mother/Baby  Anesthesia Type: Epidural  Level of Consciousness: awake  Airway and Oxygen Therapy: Patient Spontanous Breathing  Post-op Pain: none  Post-op Assessment: Patient's Cardiovascular Status Stable, Respiratory Function Stable, Patent Airway, No signs of Nausea or vomiting, Adequate PO intake, Pain level controlled, No headache, No backache, No residual numbness and No residual motor weakness  Post-op Vital Signs: Reviewed and stable  Complications: No apparent anesthesia complications

## 2012-02-13 NOTE — Progress Notes (Signed)
Patient is eating, ambulating, voiding.  Pain control is good. Appropriate lochia.  Filed Vitals:   02/12/12 2157 02/13/12 0207 02/13/12 0550 02/13/12 0945  BP: 119/72 108/58 97/63 108/67  Pulse: 83 70 58 67  Temp: 100 F (37.8 C) 99.3 F (37.4 C) 97.7 F (36.5 C) 98.2 F (36.8 C)  TempSrc: Oral Oral Oral Oral  Resp: 18 18 18 18   Height:      Weight:      SpO2:    97%    Fundus firm No CT  Lab Results  Component Value Date   WBC 7.8 02/13/2012   HGB 11.0* 02/13/2012   HCT 32.9* 02/13/2012   MCV 78.3 02/13/2012   PLT 94* 02/13/2012    --/--/O POS, O POS (10/14 1530)  A/P Post partum day 1.  Routine care.  Expect d/c 10/16    [place], [place]

## 2012-02-14 NOTE — Discharge Summary (Signed)
Obstetric Discharge Summary Reason for Admission: onset of labor Prenatal Procedures: none Intrapartum Procedures: spontaneous vaginal delivery Postpartum Procedures: none Complications-Operative and Postpartum: none Hemoglobin  Date Value Range Status  02/13/2012 11.0* 12.0 - 15.0 g/dL Final     HCT  Date Value Range Status  02/13/2012 32.9* 36.0 - 46.0 % Final    Physical Exam:  General: alert Lochia: appropriate Uterine Fundus: firm  Discharge Diagnoses: Term Pregnancy-delivered  Discharge Information: Date: 02/14/2012 Activity: pelvic rest Diet: routine Medications: PNV and Ibuprofen Condition: stable Instructions: refer to practice specific booklet Discharge to: home Follow-up Information    Follow up with Mickel Baas, MD. Schedule an appointment as soon as possible for a visit in 4 weeks.   Contact information:   [address] [place] [address] [phone]          Newborn Data: Live born female  Birth Weight: 7 lb 9.2 oz (3435 g) APGAR: 8, 9  Home with mother.  Kavir Savoca D 02/14/2012, 9:11 AM

## 2012-02-16 LAB — TYPE AND SCREEN
ABO/RH(D): O POS
Unit division: 0

## 2012-02-20 NOTE — Discharge Summary (Signed)
Obstetric Discharge Summary Reason for Admission: possible labor, elevated BP Prenatal Procedures: NST Intrapartum Procedures: observation Hemoglobin  Date Value Range Status  02/13/2012 11.0* 12.0 - 15.0 g/dL Final     HCT  Date Value Range Status  02/13/2012 32.9* 36.0 - 46.0 % Final    Discharge Diagnoses: false labor  Discharge Information: Date: 02/20/2012 Activity: unrestricted Diet: routine Medications: None Condition: stable Instructions: refer to practice specific booklet Discharge to: home Follow-up Information    Follow up with Hikaru Delorenzo A, MD. Schedule an appointment as soon as possible for a visit in 3 days. (Make appointment for a Blood Pressure check mid week )    Contact information:   [address] [place] [address] [phone]         Kaliel Bolds A 02/20/2012, 8:48 AM

## 2012-06-30 ENCOUNTER — Emergency Department (HOSPITAL_COMMUNITY): Payer: Medicaid Other

## 2012-06-30 ENCOUNTER — Emergency Department (HOSPITAL_COMMUNITY)
Admission: EM | Admit: 2012-06-30 | Discharge: 2012-07-01 | Disposition: A | Discharge status: Home / Self Care | Payer: Medicaid Other | Source: Home / Self Care | Attending: Emergency Medicine | Admitting: Emergency Medicine

## 2012-06-30 ENCOUNTER — Encounter (HOSPITAL_COMMUNITY): Payer: Self-pay | Admitting: *Deleted

## 2012-06-30 DIAGNOSIS — F329 Major depressive disorder, single episode, unspecified: Secondary | ICD-10-CM | POA: Insufficient documentation

## 2012-06-30 DIAGNOSIS — N200 Calculus of kidney: Secondary | ICD-10-CM | POA: Insufficient documentation

## 2012-06-30 DIAGNOSIS — Z79899 Other long term (current) drug therapy: Secondary | ICD-10-CM | POA: Insufficient documentation

## 2012-06-30 DIAGNOSIS — R112 Nausea with vomiting, unspecified: Secondary | ICD-10-CM | POA: Insufficient documentation

## 2012-06-30 DIAGNOSIS — Z8619 Personal history of other infectious and parasitic diseases: Secondary | ICD-10-CM | POA: Insufficient documentation

## 2012-06-30 DIAGNOSIS — Z9889 Other specified postprocedural states: Secondary | ICD-10-CM | POA: Insufficient documentation

## 2012-06-30 DIAGNOSIS — F3289 Other specified depressive episodes: Secondary | ICD-10-CM | POA: Insufficient documentation

## 2012-06-30 LAB — CBC WITH DIFFERENTIAL/PLATELET
Basophils Relative: 1 % (ref 0–1)
Eosinophils Absolute: 0.1 10*3/uL (ref 0.0–0.7)
HCT: 38.9 % (ref 36.0–46.0)
Hemoglobin: 13.8 g/dL (ref 12.0–15.0)
Lymphs Abs: 1.4 10*3/uL (ref 0.7–4.0)
MCH: 27.8 pg (ref 26.0–34.0)
MCHC: 35.5 g/dL (ref 30.0–36.0)
Monocytes Absolute: 0.2 10*3/uL (ref 0.1–1.0)
Monocytes Relative: 5 % (ref 3–12)
Neutro Abs: 3.3 10*3/uL (ref 1.7–7.7)
Neutrophils Relative %: 65 % (ref 43–77)
RBC: 4.96 MIL/uL (ref 3.87–5.11)

## 2012-06-30 LAB — COMPREHENSIVE METABOLIC PANEL
Albumin: 4 g/dL (ref 3.5–5.2)
Alkaline Phosphatase: 62 U/L (ref 39–117)
BUN: 13 mg/dL (ref 6–23)
Chloride: 112 mEq/L (ref 96–112)
Creatinine, Ser: 1.04 mg/dL (ref 0.50–1.10)
GFR calc Af Amer: 79 mL/min — ABNORMAL LOW (ref >90)
Glucose, Bld: 78 mg/dL (ref 70–99)
Potassium: 3.5 mEq/L (ref 3.5–5.1)
Total Bilirubin: 0.2 mg/dL — ABNORMAL LOW (ref 0.3–1.2)

## 2012-06-30 LAB — URINALYSIS, MICROSCOPIC ONLY
Ketones, ur: NEGATIVE mg/dL
Nitrite: NEGATIVE
Protein, ur: NEGATIVE mg/dL
Urobilinogen, UA: 0.2 mg/dL (ref 0.0–1.0)

## 2012-06-30 LAB — POCT PREGNANCY, URINE: Preg Test, Ur: NEGATIVE

## 2012-06-30 MED ORDER — SODIUM CHLORIDE 0.9 % IV SOLN
Freq: Once | INTRAVENOUS | Status: AC
  Administered 2012-06-30: 21:00:00 via INTRAVENOUS

## 2012-06-30 MED ORDER — ONDANSETRON HCL 4 MG/2ML IJ SOLN
4.0000 mg | Freq: Once | INTRAMUSCULAR | Status: AC
  Administered 2012-06-30: 4 mg via INTRAVENOUS
  Filled 2012-06-30: qty 2

## 2012-06-30 MED ORDER — HYDROMORPHONE HCL PF 1 MG/ML IJ SOLN
1.0000 mg | Freq: Once | INTRAMUSCULAR | Status: AC
  Administered 2012-06-30: 1 mg via INTRAVENOUS
  Filled 2012-06-30: qty 1

## 2012-06-30 NOTE — ED Notes (Signed)
Pt returned from CT °

## 2012-06-30 NOTE — ED Notes (Addendum)
Pt began having left sided flank pain around 12:00pm, pt states the pain got progressively worse. Pt has hx of kidney stones, states "this feels like a kidney stone". Pt also has n/v. Rates pain 8/10. Pt took advil around 2pm for pain.

## 2012-06-30 NOTE — ED Notes (Signed)
Pt transported to CT ?

## 2012-06-30 NOTE — ED Notes (Signed)
Pt reports nausea, left side back and abd pain x 4-5 hours, hx of kidney stone.

## 2012-06-30 NOTE — ED Provider Notes (Signed)
History     CSN: [id number]  Arrival date & time 06/30/12  1548   None     Chief Complaint  Patient presents with  . Flank Pain    (Consider location/radiation/quality/duration/timing/severity/associated sxs/prior treatment) The history is provided by the patient and medical records. No language interpreter was used.    Phyllis House is a 37 y.o. female  with a significant hx of multiple kidney stones presents to the Emergency Department complaining of gradual, persistent, progressively worsening L flank pain onset 12pm. Associated symptoms include nausea, vomiting, LLQ abdominal pain.  Pt took advil without relief.  Nothing makes it better and nothing makes it worse.  Pt denies fever, chills, headache, neck pain, chest pain, shortness of breath, diarrhea, weakness, dizziness, syncope.  Pt describes the pain as sharp, rated at an 8/10 located in the L flank and radiating to the LLQ.  Pt denies dysuria, hematuria, frequency, and urgency.  Pt states she often gets these symptoms, but this came without any warning.      Past Medical History  Diagnosis Date  . Kidney stones   . Trichomonas   . Herpesvirus 2   . H/O varicella   . Depression     PP  . Kidney stones   . AMA (advanced maternal age) multigravida 35+     Past Surgical History  Procedure Laterality Date  . Kidney stone surgery      Family History  Problem Relation Age of Onset  . Thyroid disease Mother   . Depression Mother   . Cancer Father     leaukemia  . Thyroid disease Brother   . Diabetes Maternal Aunt   . Cancer Maternal Grandmother     GI  . Diabetes Maternal Grandfather   . Heart disease Paternal Grandmother     History  Substance Use Topics  . Smoking status: Never Smoker   . Smokeless tobacco: Never Used  . Alcohol Use: No    OB History   Grav Para Term Preterm Abortions TAB SAB Ect Mult Living   2 2 2       2       Review of Systems  Constitutional: Negative for fever, diaphoresis,  appetite change and fatigue.  Respiratory: Negative for shortness of breath.   Cardiovascular: Negative for chest pain.  Gastrointestinal: Positive for nausea, vomiting and abdominal pain. Negative for diarrhea, constipation and blood in stool.  Genitourinary: Positive for flank pain. Negative for dysuria, urgency, frequency, hematuria and difficulty urinating.  Musculoskeletal: Negative for back pain.  Skin: Negative for rash.  Neurological: Negative for headaches.  All other systems reviewed and are negative.    Allergies  Review of patient's allergies indicates no known allergies.  Home Medications   Current Outpatient Rx  Name  Route  Sig  Dispense  Refill  . ibuprofen (ADVIL,MOTRIN) 200 MG tablet   Oral   Take 400 mg by mouth 3 (three) times daily as needed for pain.         Marland Kitchen sertraline (ZOLOFT) 50 MG tablet   Oral   Take 50 mg by mouth every evening.           BP 100/61  Pulse 61  Temp(Src) 99 F (37.2 C) (Oral)  Resp 18  SpO2 96%  Physical Exam  Nursing note and vitals reviewed. Constitutional: She is oriented to person, place, and time. She appears well-developed and well-nourished. No distress.  HENT:  Head: Normocephalic and atraumatic.  Mouth/Throat: Oropharynx is clear and  moist. No oropharyngeal exudate.  Eyes: Pupils are equal, round, and reactive to light.  Neck: Normal range of motion.  Cardiovascular: Normal rate, regular rhythm, normal heart sounds and intact distal pulses.  Exam reveals no gallop and no friction rub.   No murmur heard. Pulmonary/Chest: Effort normal and breath sounds normal. No respiratory distress. She has no wheezes. She exhibits no tenderness.  Abdominal: Soft. Normal appearance and bowel sounds are normal. She exhibits no distension and no mass. There is tenderness in the left lower quadrant. There is CVA tenderness (L). There is no rebound and no guarding.  Pt actively vomiting  Musculoskeletal: Normal range of motion. She  exhibits no edema.  Lymphadenopathy:    She has no cervical adenopathy.  Neurological: She is alert and oriented to person, place, and time. She exhibits normal muscle tone. Coordination normal.  Skin: Skin is warm and dry. No rash noted. She is not diaphoretic.  Psychiatric: She has a normal mood and affect.    ED Course  Procedures (including critical care time)  Labs Reviewed  COMPREHENSIVE METABOLIC PANEL - Abnormal; Notable for the following:    Total Bilirubin 0.2 (*)    GFR calc non Af Amer 68 (*)    GFR calc Af Amer 79 (*)    All other components within normal limits  URINALYSIS, MICROSCOPIC ONLY - Abnormal; Notable for the following:    APPearance CLOUDY (*)    Hgb urine dipstick LARGE (*)    Leukocytes, UA LARGE (*)    Bacteria, UA MANY (*)    All other components within normal limits  URINE CULTURE  CBC WITH DIFFERENTIAL  POCT PREGNANCY, URINE   Ct Abdomen Pelvis Wo Contrast  06/30/2012  *RADIOLOGY REPORT*  Clinical Data: Left-sided pain,nausea  CT ABDOMEN AND PELVIS WITHOUT CONTRAST  Technique:  Multidetector CT imaging of the abdomen and pelvis was performed following the standard protocol without intravenous contrast.  Comparison: CT 11/02/2010  Findings:  Renal:  There is hydronephrosis of the left renal collecting system.  This is secondary to a partially obstructing calculus measuring 8 mm at the left ureteral pelvic junction (image 42). This calculus is evident on the CT scout at the L3-L4 vertebral body level.  There are multiple fine calcifications  at medullary junction with the collecting system within the left kidney.  Similar calcifications within the right kidney associated  with the medullary pyramids.  No hydroureter or ureterolithiasis on the right.  Lung bases are clear.  No pericardial fluid.  No focal hepatic lesion.  The gallbladder, pancreas, spleen, adrenal glands are normal.  The stomach, small bowel, appendix, and cecum are normal. The colon rectosigmoid  colon are normal.  Abdominal aorta normal caliber.  No retroperitoneal periportal lymphadenopathy.  There is no free fluid the pelvis.  The uterus and ovaries are normal.  Bladder is normal.  No pelvic lymphadenopathy. Review of bone windows demonstrates no aggressive osseous lesions.  IMPRESSION: 1.  Large calculus at the left ureteral pelvic junction with moderate hydronephrosis of the left renal collecting system. 2.  Bilateral nephrolithiasis has increased compared to prior.   Original Report Authenticated By: Genevive Bi, M.D.      1. Kidney stone       MDM  Shirl Harris presents with Hx of kidney stones and flank pain.  Ct with There is hydronephrosis of the left renal collecting system. This is secondary to a partially obstructing calculus measuring 8 mm at the left ureteral pelvic junction.  Pt  also with large leukocytes, many bacteria and 11-20 WBCs in her urine. Pt with low grade fever.  Concern for infected stone.  Pt has been seen by Dr Isabel Caprice in the past.  Pt is without Leukocytosis on her CBC.    Discussed with Dr Mena Goes of [facility].  He recommends toradol IV with re-evaluation.  If pt pain is controlled she may be d/c home with f/u in the AM.  If pain remains uncontrolled after torradol I will reconsult for admission.    Pt given Toradol with significant decrease in pain.  Pt tolerating small amounts of PO liquids.  Will d/c home with pain control and antiemetics.  Discussed the need to f/u with urology in the morning.  I have also discussed reasons to return immediately to the ER.  Patient expresses understanding and agrees with plan.  1. Medications: Toradol, percocet, phenergan, usual home medications  2. Treatment: rest, drink plenty of fluids,  3. Follow Up: Please followup with your primary doctor for discussion of your diagnoses and further evaluation after today's visit; if you do not have a primary care doctor use the resource guide provided to find one;  call the urologist in the morning and make and appointment with Dr Mena Goes or Dr Isabel Caprice for tomorrow.         Dahlia Client Muthersbaugh, PA-C 07/01/12 0246

## 2012-07-01 ENCOUNTER — Encounter (HOSPITAL_COMMUNITY): Payer: Self-pay | Admitting: *Deleted

## 2012-07-01 ENCOUNTER — Emergency Department (HOSPITAL_COMMUNITY): Payer: Medicaid Other | Admitting: Anesthesiology

## 2012-07-01 ENCOUNTER — Inpatient Hospital Stay (HOSPITAL_COMMUNITY)
Admission: EM | Admit: 2012-07-01 | Discharge: 2012-07-05 | DRG: 694 | Disposition: A | Discharge status: Home / Self Care | Payer: Medicaid Other | Attending: Urology | Admitting: Urology

## 2012-07-01 ENCOUNTER — Encounter (HOSPITAL_COMMUNITY)
Admission: EM | Disposition: A | Discharge status: Home / Self Care | Payer: Self-pay | Source: Home / Self Care | Attending: Urology

## 2012-07-01 ENCOUNTER — Emergency Department (HOSPITAL_COMMUNITY): Payer: Medicaid Other

## 2012-07-01 ENCOUNTER — Encounter (HOSPITAL_COMMUNITY): Payer: Self-pay | Admitting: Anesthesiology

## 2012-07-01 DIAGNOSIS — N201 Calculus of ureter: Principal | ICD-10-CM

## 2012-07-01 DIAGNOSIS — R51 Headache: Secondary | ICD-10-CM | POA: Diagnosis not present

## 2012-07-01 DIAGNOSIS — D72819 Decreased white blood cell count, unspecified: Secondary | ICD-10-CM | POA: Diagnosis present

## 2012-07-01 DIAGNOSIS — R509 Fever, unspecified: Secondary | ICD-10-CM

## 2012-07-01 DIAGNOSIS — D649 Anemia, unspecified: Secondary | ICD-10-CM | POA: Diagnosis present

## 2012-07-01 DIAGNOSIS — E876 Hypokalemia: Secondary | ICD-10-CM | POA: Diagnosis present

## 2012-07-01 DIAGNOSIS — D696 Thrombocytopenia, unspecified: Secondary | ICD-10-CM | POA: Diagnosis present

## 2012-07-01 DIAGNOSIS — A498 Other bacterial infections of unspecified site: Secondary | ICD-10-CM | POA: Diagnosis present

## 2012-07-01 DIAGNOSIS — N390 Urinary tract infection, site not specified: Secondary | ICD-10-CM

## 2012-07-01 HISTORY — PX: CYSTOSCOPY W/ URETERAL STENT PLACEMENT: SHX1429

## 2012-07-01 LAB — BASIC METABOLIC PANEL
BUN: 16 mg/dL (ref 6–23)
Chloride: 109 mEq/L (ref 96–112)
Creatinine, Ser: 1.35 mg/dL — ABNORMAL HIGH (ref 0.50–1.10)
Glucose, Bld: 105 mg/dL — ABNORMAL HIGH (ref 70–99)
Potassium: 2.5 mEq/L — CL (ref 3.5–5.1)

## 2012-07-01 LAB — CBC
HCT: 35.3 % — ABNORMAL LOW (ref 36.0–46.0)
Hemoglobin: 12.4 g/dL (ref 12.0–15.0)
MCV: 79.1 fL (ref 78.0–100.0)
RDW: 13.3 % (ref 11.5–15.5)
WBC: 15 10*3/uL — ABNORMAL HIGH (ref 4.0–10.5)

## 2012-07-01 SURGERY — CYSTOSCOPY, WITH RETROGRADE PYELOGRAM AND URETERAL STENT INSERTION
Anesthesia: General | Site: Ureter | Laterality: Left | Wound class: Clean Contaminated

## 2012-07-01 MED ORDER — BELLADONNA ALKALOIDS-OPIUM 16.2-60 MG RE SUPP: 1.0000 | Freq: Four times a day (QID) | RECTAL | Status: DC | PRN

## 2012-07-01 MED ORDER — MIDAZOLAM HCL 5 MG/5ML IJ SOLN
INTRAMUSCULAR | Status: DC | PRN
  Administered 2012-07-01: 0.5 mg via INTRAVENOUS

## 2012-07-01 MED ORDER — SENNOSIDES-DOCUSATE SODIUM 8.6-50 MG PO TABS
1.0000 | ORAL_TABLET | Freq: Two times a day (BID) | ORAL | Status: DC
  Administered 2012-07-01 – 2012-07-05 (×8): 1 via ORAL
  Filled 2012-07-01 (×9): qty 1

## 2012-07-01 MED ORDER — FENTANYL CITRATE 0.05 MG/ML IJ SOLN
INTRAMUSCULAR | Status: AC
  Filled 2012-07-01: qty 2

## 2012-07-01 MED ORDER — PROMETHAZINE HCL 25 MG PO TABS
25.0000 mg | ORAL_TABLET | Freq: Four times a day (QID) | ORAL | Status: DC | PRN
  Administered 2012-07-03: 25 mg via ORAL
  Filled 2012-07-01: qty 1

## 2012-07-01 MED ORDER — KETOROLAC TROMETHAMINE 30 MG/ML IJ SOLN
15.0000 mg | Freq: Once | INTRAMUSCULAR | Status: AC | PRN
Start: 2012-07-01 — End: 2012-07-01
  Administered 2012-07-01: 30 mg via INTRAVENOUS

## 2012-07-01 MED ORDER — ONDANSETRON HCL 4 MG/2ML IJ SOLN
4.0000 mg | INTRAMUSCULAR | Status: DC | PRN
  Administered 2012-07-01 – 2012-07-05 (×4): 4 mg via INTRAVENOUS
  Filled 2012-07-01 (×4): qty 2

## 2012-07-01 MED ORDER — LIDOCAINE HCL (CARDIAC) 20 MG/ML IV SOLN
INTRAVENOUS | Status: DC | PRN
  Administered 2012-07-01: 30 mg via INTRAVENOUS

## 2012-07-01 MED ORDER — SODIUM CHLORIDE 0.9 % IV SOLN: Freq: Once | INTRAVENOUS | Status: DC

## 2012-07-01 MED ORDER — IOHEXOL 300 MG/ML  SOLN
INTRAMUSCULAR | Status: AC
  Filled 2012-07-01: qty 1

## 2012-07-01 MED ORDER — HYDROMORPHONE HCL PF 1 MG/ML IJ SOLN
1.0000 mg | Freq: Once | INTRAMUSCULAR | Status: AC
  Administered 2012-07-01: 1 mg via INTRAVENOUS
  Filled 2012-07-01: qty 1

## 2012-07-01 MED ORDER — POTASSIUM CHLORIDE CRYS ER 20 MEQ PO TBCR
20.0000 meq | EXTENDED_RELEASE_TABLET | Freq: Three times a day (TID) | ORAL | Status: DC
  Administered 2012-07-01 (×2): 20 meq via ORAL
  Filled 2012-07-01 (×5): qty 1

## 2012-07-01 MED ORDER — SODIUM CHLORIDE 0.9 % IV BOLUS (SEPSIS)
1000.0000 mL | Freq: Once | INTRAVENOUS | Status: AC
  Administered 2012-07-01: 1000 mL via INTRAVENOUS

## 2012-07-01 MED ORDER — KETOROLAC TROMETHAMINE 30 MG/ML IJ SOLN
INTRAMUSCULAR | Status: AC
  Filled 2012-07-01: qty 1

## 2012-07-01 MED ORDER — DEXTROSE 5 % IV SOLN
1.0000 g | INTRAVENOUS | Status: DC
  Administered 2012-07-02: 1 g via INTRAVENOUS
  Filled 2012-07-01 (×2): qty 10

## 2012-07-01 MED ORDER — MORPHINE SULFATE 2 MG/ML IJ SOLN
2.0000 mg | INTRAMUSCULAR | Status: DC | PRN
  Administered 2012-07-02: 2 mg via INTRAVENOUS
  Filled 2012-07-01: qty 1

## 2012-07-01 MED ORDER — FENTANYL CITRATE 0.05 MG/ML IJ SOLN
25.0000 ug | INTRAMUSCULAR | Status: DC | PRN
  Administered 2012-07-01: 50 ug via INTRAVENOUS

## 2012-07-01 MED ORDER — LIDOCAINE HCL 2 % EX GEL
CUTANEOUS | Status: AC
  Filled 2012-07-01: qty 10

## 2012-07-01 MED ORDER — STERILE WATER FOR IRRIGATION IR SOLN
Status: DC | PRN
  Administered 2012-07-01: 500 mL

## 2012-07-01 MED ORDER — SODIUM CHLORIDE 0.9 % IV SOLN
INTRAVENOUS | Status: DC
  Administered 2012-07-01 – 2012-07-04 (×7): via INTRAVENOUS

## 2012-07-01 MED ORDER — DEXTROSE 5 % IV SOLN
1.0000 g | Freq: Once | INTRAVENOUS | Status: DC
  Filled 2012-07-01: qty 10

## 2012-07-01 MED ORDER — SODIUM CHLORIDE 0.9 % IV SOLN
INTRAVENOUS | Status: DC | PRN
  Administered 2012-07-01: 12:00:00 via INTRAVENOUS

## 2012-07-01 MED ORDER — ACETAMINOPHEN 10 MG/ML IV SOLN
INTRAVENOUS | Status: DC | PRN
  Administered 2012-07-01: 1000 mg via INTRAVENOUS

## 2012-07-01 MED ORDER — ENOXAPARIN SODIUM 40 MG/0.4ML ~~LOC~~ SOLN
40.0000 mg | SUBCUTANEOUS | Status: DC
  Filled 2012-07-01 (×2): qty 0.4

## 2012-07-01 MED ORDER — BELLADONNA ALKALOIDS-OPIUM 16.2-60 MG RE SUPP
RECTAL | Status: DC | PRN
  Administered 2012-07-01: 1 via RECTAL

## 2012-07-01 MED ORDER — OXYCODONE HCL 5 MG PO TABS
5.0000 mg | ORAL_TABLET | ORAL | Status: DC | PRN
  Administered 2012-07-02 (×2): 5 mg via ORAL
  Administered 2012-07-03 (×2): 10 mg via ORAL
  Administered 2012-07-03: 5 mg via ORAL
  Administered 2012-07-04: 10 mg via ORAL
  Administered 2012-07-04: 5 mg via ORAL
  Filled 2012-07-01: qty 1
  Filled 2012-07-01: qty 2
  Filled 2012-07-01: qty 1
  Filled 2012-07-01: qty 2
  Filled 2012-07-01: qty 1
  Filled 2012-07-01: qty 2
  Filled 2012-07-01: qty 1

## 2012-07-01 MED ORDER — LACTATED RINGERS IV SOLN
INTRAVENOUS | Status: DC | PRN
  Administered 2012-07-01: 12:00:00 via INTRAVENOUS

## 2012-07-01 MED ORDER — PROPOFOL 10 MG/ML IV EMUL
INTRAVENOUS | Status: DC | PRN
  Administered 2012-07-01: 170 mg via INTRAVENOUS

## 2012-07-01 MED ORDER — SERTRALINE HCL 50 MG PO TABS
50.0000 mg | ORAL_TABLET | Freq: Every evening | ORAL | Status: DC
  Administered 2012-07-01 – 2012-07-05 (×5): 50 mg via ORAL
  Filled 2012-07-01 (×5): qty 1

## 2012-07-01 MED ORDER — ONDANSETRON HCL 4 MG/2ML IJ SOLN
4.0000 mg | Freq: Once | INTRAMUSCULAR | Status: AC
  Administered 2012-07-01: 4 mg via INTRAVENOUS
  Filled 2012-07-01: qty 2

## 2012-07-01 MED ORDER — LIDOCAINE HCL 2 % EX GEL
CUTANEOUS | Status: DC | PRN
  Administered 2012-07-01: 1 via URETHRAL

## 2012-07-01 MED ORDER — ACETAMINOPHEN 10 MG/ML IV SOLN
INTRAVENOUS | Status: AC
  Filled 2012-07-01: qty 100

## 2012-07-01 MED ORDER — FENTANYL CITRATE 0.05 MG/ML IJ SOLN
INTRAMUSCULAR | Status: DC | PRN
  Administered 2012-07-01: 75 ug via INTRAVENOUS
  Administered 2012-07-01: 50 ug via INTRAVENOUS
  Administered 2012-07-01: 25 ug via INTRAVENOUS

## 2012-07-01 MED ORDER — KETOROLAC TROMETHAMINE 30 MG/ML IJ SOLN
30.0000 mg | Freq: Once | INTRAMUSCULAR | Status: AC
  Administered 2012-07-01: 30 mg via INTRAVENOUS
  Filled 2012-07-01: qty 1

## 2012-07-01 MED ORDER — PROMETHAZINE HCL 25 MG/ML IJ SOLN: 6.2500 mg | INTRAMUSCULAR | Status: DC | PRN

## 2012-07-01 MED ORDER — LACTATED RINGERS IV SOLN
INTRAVENOUS | Status: DC
  Administered 2012-07-01: 1000 mL via INTRAVENOUS

## 2012-07-01 MED ORDER — PROMETHAZINE HCL 25 MG PO TABS: 25.0000 mg | ORAL_TABLET | Freq: Four times a day (QID) | ORAL | Status: DC | PRN

## 2012-07-01 MED ORDER — BELLADONNA ALKALOIDS-OPIUM 16.2-60 MG RE SUPP
RECTAL | Status: AC
  Filled 2012-07-01: qty 1

## 2012-07-01 MED ORDER — OXYCODONE-ACETAMINOPHEN 5-325 MG PO TABS: 1.0000 | ORAL_TABLET | ORAL | Status: DC | PRN

## 2012-07-01 MED ORDER — KETOROLAC TROMETHAMINE 10 MG PO TABS: 10.0000 mg | ORAL_TABLET | Freq: Four times a day (QID) | ORAL | Status: DC | PRN

## 2012-07-01 MED ORDER — METOCLOPRAMIDE HCL 5 MG/ML IJ SOLN
10.0000 mg | Freq: Once | INTRAMUSCULAR | Status: AC
  Administered 2012-07-01: 10 mg via INTRAVENOUS
  Filled 2012-07-01: qty 2

## 2012-07-01 MED ORDER — DEXTROSE 5 % IV SOLN
1.0000 g | INTRAVENOUS | Status: AC
  Administered 2012-07-01: 1 g via INTRAVENOUS
  Filled 2012-07-01: qty 10

## 2012-07-01 MED ORDER — ONDANSETRON HCL 4 MG/2ML IJ SOLN
INTRAMUSCULAR | Status: DC | PRN
  Administered 2012-07-01 (×2): 2 mg via INTRAVENOUS

## 2012-07-01 MED ORDER — IOHEXOL 300 MG/ML  SOLN
INTRAMUSCULAR | Status: DC | PRN
  Administered 2012-07-01: 10 mL via URETHRAL

## 2012-07-01 MED ORDER — POTASSIUM CHLORIDE 10 MEQ/100ML IV SOLN
10.0000 meq | INTRAVENOUS | Status: DC
  Administered 2012-07-01 (×4): 10 meq via INTRAVENOUS
  Filled 2012-07-01 (×4): qty 100

## 2012-07-01 SURGICAL SUPPLY — 19 items
ADAPTER CATH URET PLST 4-6FR (CATHETERS) ×2 IMPLANT
ADPR CATH URET STRL DISP 4-6FR (CATHETERS) ×1
BAG URO CATCHER STRL LF (DRAPE) ×2 IMPLANT
CATH FOLEY 2WAY SLVR  5CC 16FR (CATHETERS) ×1
CATH FOLEY 2WAY SLVR 5CC 16FR (CATHETERS) IMPLANT
CATH INTERMIT  6FR 70CM (CATHETERS) ×1 IMPLANT
CATH URET 5FR 28IN OPEN ENDED (CATHETERS) ×1 IMPLANT
CLOTH BEACON ORANGE TIMEOUT ST (SAFETY) ×2 IMPLANT
DRAPE CAMERA CLOSED 9X96 (DRAPES) ×2 IMPLANT
GLOVE BIOGEL M 7.0 STRL (GLOVE) ×2 IMPLANT
GOWN PREVENTION PLUS XLARGE (GOWN DISPOSABLE) ×2 IMPLANT
GOWN STRL NON-REIN LRG LVL3 (GOWN DISPOSABLE) ×2 IMPLANT
GUIDEWIRE STR DUAL SENSOR (WIRE) ×2 IMPLANT
MANIFOLD NEPTUNE II (INSTRUMENTS) ×2 IMPLANT
PACK CYSTO (CUSTOM PROCEDURE TRAY) ×2 IMPLANT
SCRUB PCMX 4 OZ (MISCELLANEOUS) ×2 IMPLANT
STENT CONTOUR 6FRX24X.038 (STENTS) ×1 IMPLANT
SYR 20CC LL (SYRINGE) ×1 IMPLANT
TUBING CONNECTING 10 (TUBING) ×1 IMPLANT

## 2012-07-01 NOTE — Discharge Instructions (Signed)
1. Medications: Toradol, percocet, phenergan, usual home medications 2. Treatment: rest, drink plenty of fluids, 3. Follow Up: Please followup with your primary doctor for discussion of your diagnoses and further evaluation after today's visit; if you do not have a primary care doctor use the resource guide provided to find one; call the urologist in the morning and make and appointment with Dr Mena Goes or Dr Isabel Caprice for tomorrow.  You have been diagnosed with kidney stones.  Use your pain medication as prescribed and do not operate heavy machinery while on this medication. Note that your pain medication contains Acetaminophen (Tylenol), therefore it is not recommended to take additional Tylenol while on your pain medication. Continue to drink fluids to help you pass the stones.  Use Zofran for nausea as directed.  Followup with your primary care doctor in regards to your hospital visit.  Return to the ED immediately if you develop fever that persists > 101, uncontrolled pain or vomiting, or other concerns.  Read the instructions below to learn more about kidney stones.  If you do not have a primary care doctor to followup with, use the resource guide attached to help you find one.   Kidney Stones Kidney stones (ureteral lithiasis) are solid masses that form inside your kidneys. The intense pain is caused by the stone moving through the kidney, ureter, bladder, and urethra (urinary tract). When the stone moves, the ureter starts to spasm around the stone. The stone is usually passed in the urine.  HOME CARE  Drink enough fluids to keep your pee (urine) clear or pale yellow. This helps to get the stone out.   Only take medicine as told by your doctor.   Follow up with your doctor as told.  GET HELP RIGHT AWAY IF:   Your pain does not get better with medicine.   You have a fever.   Your pain increases and gets worse over 18 hours.   You have new belly (abdominal) pain.   You feel faint or pass  out.  MAKE SURE YOU:   Understand these instructions.   Will watch your condition.   Will get help right away if you are not doing well or get worse.   RESOURCE GUIDE  Dental Problems  Patients with Medicaid: [facility] [phone] [address]                                           [phone] [address] Phone:  [phone]                                                  Phone:  [phone]  If unable to pay or uninsured, contact:  [facility] or [facility]. to become qualified for the adult dental clinic.  Chronic Pain Problems Contact [facility]  [phone] Patients need to be referred by their primary care doctor.  Insufficient Money for Medicine Contact [employer] Way:  call "211" or [facility] 4631167024.  No Primary Care Doctor Call [facility]  [phone] Other agencies that provide inexpensive medical care  [facility]  [phone]    [facility]  [phone]    [facility]  [phone]    [facility]  [phone]    [facility]  [phone]    [facility]  [phone]  Psychological Services [facility]  [phone] [facility]  [phone] [facility] Mental Health   [phone] (emergency services [phone])  Substance Abuse Resources [facility]  [phone] [facility] [phone] [facility] [phone] Floydene Flock [phone] Residential & Outpatient Substance Abuse Program  [phone]  Abuse/Neglect [facility] Child Abuse Hotline (206)141-3925 [facility] Child Abuse Hotline (629)802-0558 (After Hours)  Emergency Shelter [facility] [phone]  Maternity Homes [facility] [place] [facility] [phone] [facility] 5312275759  MRSA Hotline #:   (774)787-2692    [facility] Resources  [facility] [place]     [employer]                          [facility] Dept. [address] [place]                       [address]      [address]  Blondell Reveal Phone:  [phone]                                   Phone:  [phone]                 Phone:  [phone]  [facility] Phone:  [phone]  [facility] Child Abuse Hotline [phone] (After Hours)

## 2012-07-01 NOTE — Anesthesia Preprocedure Evaluation (Addendum)
Anesthesia Evaluation  Patient identified by MRN, date of birth, ID band Patient awake    Reviewed: Allergy & Precautions, H&P , NPO status , Patient's Chart, lab work & pertinent test results  Airway Mallampati: II TM Distance: >3 FB Neck ROM: Full    Dental no notable dental hx.    Pulmonary neg pulmonary ROS,  breath sounds clear to auscultation  Pulmonary exam normal       Cardiovascular negative cardio ROS  Rhythm:Regular Rate:Normal     Neuro/Psych negative neurological ROS  negative psych ROS   GI/Hepatic negative GI ROS, Neg liver ROS,   Endo/Other  negative endocrine ROS  Renal/GU negative Renal ROS  negative genitourinary   Musculoskeletal negative musculoskeletal ROS (+)   Abdominal   Peds negative pediatric ROS (+)  Hematology negative hematology ROS (+)   Anesthesia Other Findings   Reproductive/Obstetrics negative OB ROS                           Anesthesia Physical Anesthesia Plan  ASA: I and emergent  Anesthesia Plan: General   Post-op Pain Management:    Induction: Intravenous  Airway Management Planned: LMA  Additional Equipment:   Intra-op Plan:   Post-operative Plan:   Informed Consent: I have reviewed the patients History and Physical, chart, labs and discussed the procedure including the risks, benefits and alternatives for the proposed anesthesia with the patient or authorized representative who has indicated his/her understanding and acceptance.   Dental advisory given  Plan Discussed with: CRNA and Surgeon  Anesthesia Plan Comments:         Anesthesia Quick Evaluation

## 2012-07-01 NOTE — Consult Note (Addendum)
Urology Consult  Requesting provider:  Dr. Dana Allan  CC: Left ureter stone. Fever  HPI: 37 year old female presents to the ER with a left ureter stone.  She was having symptoms of left-sided abd on 06/30/12. She presented to [facility] room where she had a CT scan on that same day which revealed multiple bilateral nephrolithiasis. She also had a left ureter stone. This was 8 mm in size. It was located in the left proximal ureter. It was associated with hydronephrosis. Urinalysis was concerning for infection and a urine culture was sent. The patient was discharged home from the ER after her pain was controlled. She was discharged home with an antibiotic, but she never filled the prescription. She presented back to the ER today with continued flank pain and new onset of fever.   Her fever was 101. This began this morning at approximately 7 AM. It is not associated with nausea or emesis. She has had nausea and emesis with her stone. She denies lightheadedness or dizziness. Nothing seems to make it better or worse.  I reviewed her imaging and her laboratory values. She had a negative urine pregnancy test yesterday. I explained to the patient that I'm concerned that her stone may be obstructing resulting in infection. I explained to her that this infection could get into her blood system very quickly if the stone continues to block her kidney. I explained that bacteria in the blood can be very serious and even deadly We discussed management options which include antibiotics, percutaneous nephrostomy tube placement, or left ureter stent placement. We discussed the risks, benefits, alternatives, and likelihood of achieving goals with each option. I do not recommend antibiotics alone. We discussed cystoscopy, left retrograde pyelogram, and left ureter stent placement. We discussed the risks, which include, but are not limited to, bleeding, infection, pneumonia, heart attack, stroke, death, inability  to place stent, postoperative stent discomfort, and damage to contiguous structures. I explained to the patient that this would not treat her stone and that she would have to return to the operating room or return for different type of treatment in the future.  PMH: Past Medical History  Diagnosis Date  . Kidney stones   . Trichomonas   . Herpesvirus 2   . H/O varicella   . Depression     PP  . Kidney stones   . AMA (advanced maternal age) multigravida 35+     PSH: Past Surgical History  Procedure Laterality Date  . Kidney stone surgery      Allergies: No Known Allergies  Medications: Prescriptions prior to admission  Medication Sig Dispense Refill  . sertraline (ZOLOFT) 50 MG tablet Take 50 mg by mouth every evening.      Marland Kitchen ibuprofen (ADVIL,MOTRIN) 200 MG tablet Take 400 mg by mouth 3 (three) times daily as needed for pain.      Marland Kitchen ketorolac (TORADOL) 10 MG tablet Take 1 tablet (10 mg total) by mouth every 6 (six) hours as needed for pain.  20 tablet  0  . oxyCODONE-acetaminophen (PERCOCET) 5-325 MG per tablet Take 1 tablet by mouth every 4 (four) hours as needed for pain.  20 tablet  0  . promethazine (PHENERGAN) 25 MG tablet Take 1 tablet (25 mg total) by mouth every 6 (six) hours as needed for nausea.  12 tablet  0     Social History: History   Social History  . Marital Status: Divorced    Spouse Name: N/A  Number of Children: N/A  . Years of Education: N/A   Occupational History  . Not on file.   Social History Main Topics  . Smoking status: Never Smoker   . Smokeless tobacco: Never Used  . Alcohol Use: No  . Drug Use: No  . Sexually Active: Yes   Other Topics Concern  . Not on file   Social History Narrative  . No narrative on file    Family History: Family History  Problem Relation Age of Onset  . Thyroid disease Mother   . Depression Mother   . Cancer Father     leaukemia  . Thyroid disease Brother   . Diabetes Maternal Aunt   . Cancer  Maternal Grandmother     GI  . Diabetes Maternal Grandfather   . Heart disease Paternal Grandmother     Review of Systems: Positive: Nausea, emesis, left flank pain, left abdominal pain. Negative: SOB, chest pain, or dizziness..  A further 10 point review of systems was negative except what is listed in the HPI.  Physical Exam: Filed Vitals:   07/01/12 1044  BP: 113/61  Pulse: 86  Temp: 99 F (37.2 C)    General: No acute distress.  Awake. Head:  Normocephalic.  Atraumatic. ENT:  EOMI.  Mucous membranes moist Neck:  Supple.  No lymphadenopathy. CV:  S1 present. S2 present. Regular rate. Pulmonary: Equal effort bilaterally.  Clear to auscultation bilaterally. Abdomen: Soft.  Mildly tender to palpation in LLQ only. Negative rebound TTP. Skin:  Normal turgor.  No visible rash. Extremity: No gross deformity of bilateral upper extremities.  No gross deformity of    bilateral lower extremities. Neurologic: Alert. Appropriate mood.  .  Studies:  Recent Labs     06/30/12  1601  07/01/12  0950  HGB  13.8  12.4  WBC  5.1  15.0*  PLT  185  120*    Recent Labs     06/30/12  1601  07/01/12  0950  NA  141  134*  K  3.5  2.5*  CL  112  109  CO2  19  13*  BUN  13  16  CREATININE  1.04  1.35*  CALCIUM  9.1  8.0*  GFRNONAA  68*  49*  GFRAA  79*  57*     No results found for this basename: PT, INR, APTT,  in the last 72 hours   No components found with this basename: ABG,     Assessment:  Left proximal obstructing ureter stone. Fever.  Plan: She wishes to proceed to the operating room for cystoscopy, left retrograde pyelogram, and left ureter stent placement.  She has been n.p.o. since yesterday.  Urine pregnancy test yesterday negative.   Urine cultures pending; I have asked for blood cultures to be drawn, but she has already started IV antibiotics.  Potassium is low at 2.5. Replace potassium to appropriate levels before returning to the operating  room.  Informed consent obtained.    Pager: [phone]    CC: Dr. Lorenso Courier

## 2012-07-01 NOTE — Transfer of Care (Signed)
Immediate Anesthesia Transfer of Care Note  Patient: Phyllis House  Procedure(s) Performed: Procedure(s): CYSTOSCOPY WITH RETROGRADE PYELOGRAM/URETERAL STENT PLACEMENT (Left)  Patient Location: PACU  Anesthesia Type:General  Level of Consciousness: awake, alert , oriented and patient cooperative  Airway & Oxygen Therapy: Patient Spontanous Breathing, Patient connected to face mask oxygen and Patient connected to face mask  Post-op Assessment: Report given to PACU RN and Post -op Vital signs reviewed and stable  Post vital signs: stable  Complications: No apparent anesthesia complications

## 2012-07-01 NOTE — ED Notes (Signed)
Patient went to radiology- will do EKG when she returns

## 2012-07-01 NOTE — ED Notes (Signed)
I spoke with Dr.Powers and let him know about the potassium and also called the OR and spoke with [facility] and gave her the results of the potassium, she will let the surgeon know.

## 2012-07-01 NOTE — ED Notes (Signed)
Pt vomiting clear emesis.

## 2012-07-01 NOTE — ED Notes (Signed)
Critical potassium value of 2.5 called from the lab. Will notify MD. Pt has left the floor to go to OR.

## 2012-07-01 NOTE — ED Provider Notes (Signed)
Medical screening examination/treatment/procedure(s) were performed by non-physician practitioner and as supervising physician I was immediately available for consultation/collaboration.   Nelia Shi, MD 07/01/12 [phone]

## 2012-07-01 NOTE — ED Provider Notes (Addendum)
History     CSN: [id number]  Arrival date & time 07/01/12  2130   First MD Initiated Contact with Patient 07/01/12 [phone]      Chief Complaint  Patient presents with  . Nephrolithiasis    (Consider location/radiation/quality/duration/timing/severity/associated sxs/prior treatment) HPI Comments: Phyllis House was seen in the ER overnight and eventually discharged home with instructions to follow-up closely with her urologist for further evaluation of a left ureteral stone and a UTI.  She reports the pain has worsened and she has now developed a fever.  She was unable to fill the prescriptions provided when she left the ER secondary to problems with the computers at her pharmacy.  Her symptoms currently include fever, left lower abdominal, flank, and back pain, nausea, and dysuria.    The history is provided by the patient. No language interpreter was used.    Past Medical History  Diagnosis Date  . Kidney stones   . Trichomonas   . Herpesvirus 2   . H/O varicella   . Depression     PP  . Kidney stones   . AMA (advanced maternal age) multigravida 35+     Past Surgical History  Procedure Laterality Date  . Kidney stone surgery      Family History  Problem Relation Age of Onset  . Thyroid disease Mother   . Depression Mother   . Cancer Father     leaukemia  . Thyroid disease Brother   . Diabetes Maternal Aunt   . Cancer Maternal Grandmother     GI  . Diabetes Maternal Grandfather   . Heart disease Paternal Grandmother     History  Substance Use Topics  . Smoking status: Never Smoker   . Smokeless tobacco: Never Used  . Alcohol Use: No    OB History   Grav Para Term Preterm Abortions TAB SAB Ect Mult Living   2 2 2       2       Review of Systems  Constitutional: Positive for fever, chills, appetite change and fatigue. Negative for activity change.  HENT: Negative.   Eyes: Negative.   Respiratory: Negative.   Cardiovascular: Negative.   Gastrointestinal:  Positive for abdominal pain. Negative for nausea, vomiting and diarrhea.  Endocrine: Negative for polyuria.  Genitourinary: Positive for dysuria, urgency, hematuria and flank pain.  Musculoskeletal: Positive for back pain.  Skin: Negative.   Neurological: Negative.   Hematological: Negative.   Psychiatric/Behavioral: Negative.     Allergies  Review of patient's allergies indicates no known allergies.  Home Medications   Current Outpatient Rx  Name  Route  Sig  Dispense  Refill  . sertraline (ZOLOFT) 50 MG tablet   Oral   Take 50 mg by mouth every evening.         Marland Kitchen ibuprofen (ADVIL,MOTRIN) 200 MG tablet   Oral   Take 400 mg by mouth 3 (three) times daily as needed for pain.         Marland Kitchen ketorolac (TORADOL) 10 MG tablet   Oral   Take 1 tablet (10 mg total) by mouth every 6 (six) hours as needed for pain.   20 tablet   0   . oxyCODONE-acetaminophen (PERCOCET) 5-325 MG per tablet   Oral   Take 1 tablet by mouth every 4 (four) hours as needed for pain.   20 tablet   0   . promethazine (PHENERGAN) 25 MG tablet   Oral   Take 1 tablet (25 mg total)  by mouth every 6 (six) hours as needed for nausea.   12 tablet   0     BP 95/54  Pulse 87  Temp(Src) 100.9 F (38.3 C) (Oral)  SpO2 97%  Physical Exam  Nursing note and vitals reviewed. Constitutional: She appears well-developed and well-nourished. No distress.  Pt appears uncomfortable.   HENT:  Head: Normocephalic and atraumatic.  Right Ear: External ear normal.  Left Ear: External ear normal.  Nose: Nose normal.  Mouth/Throat: Oropharynx is clear and moist. No oropharyngeal exudate.  Eyes: Conjunctivae are normal. Pupils are equal, round, and reactive to light. Right eye exhibits no discharge. Left eye exhibits no discharge. No scleral icterus.  Neck: Normal range of motion. Neck supple. No JVD present. No tracheal deviation present.  Cardiovascular: Normal rate, regular rhythm, normal heart sounds and intact  distal pulses.  Exam reveals no gallop and no friction rub.   No murmur heard. Pulmonary/Chest: Effort normal and breath sounds normal. No stridor. No respiratory distress. She has no wheezes. She has no rales. She exhibits no tenderness.  Abdominal: Soft. Normal appearance and bowel sounds are normal. She exhibits no distension, no ascites and no mass. There is no hepatosplenomegaly. There is tenderness (left flank and LLQ) in the left lower quadrant. There is guarding and CVA tenderness. There is no rigidity, no rebound and negative Murphy's sign. No hernia.  Musculoskeletal: Normal range of motion. She exhibits no edema and no tenderness.  + left CVAT  Lymphadenopathy:    She has no cervical adenopathy.  Skin: Skin is warm and dry. No rash noted. She is not diaphoretic. No erythema. No pallor.  Psychiatric: She has a normal mood and affect. Her behavior is normal.    ED Course  Procedures (including critical care time)  Labs Reviewed - No data to display Ct Abdomen Pelvis Wo Contrast  06/30/2012  *RADIOLOGY REPORT*  Clinical Data: Left-sided pain,nausea  CT ABDOMEN AND PELVIS WITHOUT CONTRAST  Technique:  Multidetector CT imaging of the abdomen and pelvis was performed following the standard protocol without intravenous contrast.  Comparison: CT 11/02/2010  Findings:  Renal:  There is hydronephrosis of the left renal collecting system.  This is secondary to a partially obstructing calculus measuring 8 mm at the left ureteral pelvic junction (image 42). This calculus is evident on the CT scout at the L3-L4 vertebral body level.  There are multiple fine calcifications  at medullary junction with the collecting system within the left kidney.  Similar calcifications within the right kidney associated  with the medullary pyramids.  No hydroureter or ureterolithiasis on the right.  Lung bases are clear.  No pericardial fluid.  No focal hepatic lesion.  The gallbladder, pancreas, spleen, adrenal glands  are normal.  The stomach, small bowel, appendix, and cecum are normal. The colon rectosigmoid colon are normal.  Abdominal aorta normal caliber.  No retroperitoneal periportal lymphadenopathy.  There is no free fluid the pelvis.  The uterus and ovaries are normal.  Bladder is normal.  No pelvic lymphadenopathy. Review of bone windows demonstrates no aggressive osseous lesions.  IMPRESSION: 1.  Large calculus at the left ureteral pelvic junction with moderate hydronephrosis of the left renal collecting system. 2.  Bilateral nephrolithiasis has increased compared to prior.   Original Report Authenticated By: Genevive Bi, M.D.      No diagnosis found.   Date: 07/01/2012 @ 1027  Rate: 77 bpm  Rhythm: sinus  QRS Axis: normal  Intervals: normal  ST/T Wave abnormalities:  normal  Conduction Disutrbances:poor R progression  Narrative Interpretation:   Old EKG Reviewed: none available      MDM  Pt returns to the ER with severe left flank pain.  She was seen yesterday and treated for an 8 mm ureteral stone and UTI.  She is febrile, appears acutely uncomfortable, note borderline low BP, NAD.  Will obtain basic labs.  She has not had antibiotics since leaving the ER.  Will administer zosyn, dilaudid, toradol, and zofran.  Will place a consult to her urologist for further recommendations.  I anticipate she will be admitted.  1610.  Discussed her evaluation with Dr. Margarita Grizzle (urology).  He will review her labs and imaging and is making preparations for removing the stone in the OR.  She has been made NPO and routine preoperative labs, EKG, and CXR have been ordered.      Tobin Chad, MD 07/01/12 9604   Tobin Chad, MD 07/01/12 1044

## 2012-07-01 NOTE — Op Note (Signed)
Urology Operative Report  Date of Procedure: 07/01/12  Surgeon: Natalia Leatherwood, MD Assistant: None  Preoperative Diagnosis: Left ureter stone, fever. Postoperative Diagnosis:  Same  Procedure(s): Cystoscopy Left retrograde pyelogram Left ureter stent placement Foley catheter placement  Estimated blood loss: None  Specimen: None  Drains: Foley catheter (16Fr)  Complications: None  Findings: Left proximal ureter stone visible on fluoroscopy. Return of urinary sediment after placement of ureter stent on the left. Failure of contrast to pass left proximal ureter stone on retrograde pyelogram.  History of present illness: 37 year old female presented to the ER yesterday with acute left flank pain. She was found to have left proximal ureter stone and there was also concern for urinary tract infection. She was sent home with antibiotics oliguric cultures were obtained. She returned to the ER today with fever to 101. After reviewing risks and benefits she elected to proceed with left ureter stent placement in the operating room.   Procedure in detail: After informed consent was obtained, the patient was taken to the operating room. They were placed in the supine position. SCDs were turned on and in place. IV antibiotics were infused, and general anesthesia was induced. A timeout was performed in which the correct patient, surgical site, and procedure were identified and agreed upon by the team.  The patient was placed in a dorsolithotomy position, making sure to pad all pertinent neurovascular pressure points. The genitals were prepped and draped in the usual sterile fashion.  A 12 cystoscope was advanced through the urethra and the bladder was drained. Her bladder was then fully distended and evaluated in a systematic fashion with a 12 and 70 lens; there were no lesions or tumors noted. The right ureter was seen to be effluxing clearly urine. Attention was turned to the left ureter  orifice which was cannulated with a 5 French ureter catheter. I injected Omnipaque to obtain a retrograde pyelogram. It is noted that the left proximal ureter stone was visible on fluoroscopy. Contrast was able to pass up the ureter but not beyond the left proximal ureter stone. The ureter catheter was removed and a sensor tip wire was placed up the left ureter and this was able to pass the stone with ease. A good curl was noted in the left renal pelvis on fluoroscopy. This actually appeared to not the stone back into the left renal pelvis. I then placed a 6 x 24 double-J ureter stent over the wire without the tethering string in place. This was deployed in the left renal pelvis under fluoroscopy and a good curl in the bladder as well. I then placed 10 cc of lidocaine jelly followed by a 16 French Foley catheter with 10 cc of sterile water into the balloon into her bladder. A belladonna and opium suppository was placed into her rectum.  Anesthesia was reversed and she was placed into a supine position. She was taken to the [facility] in stable condition. She will be admitted to the hospital for her fever, IV hydration, and ongoing antibiotics.

## 2012-07-01 NOTE — ED Notes (Signed)
Pt was seen in the [facility] ED 06/30/2012 was dx with left kidney stone. Pt was to f/u with urology today but pain became unbearable at home and so she came back to the ED this morning.

## 2012-07-02 ENCOUNTER — Encounter (HOSPITAL_COMMUNITY): Payer: Self-pay | Admitting: Urology

## 2012-07-02 ENCOUNTER — Other Ambulatory Visit: Payer: Self-pay | Admitting: Urology

## 2012-07-02 DIAGNOSIS — N390 Urinary tract infection, site not specified: Secondary | ICD-10-CM | POA: Diagnosis present

## 2012-07-02 LAB — URINE CULTURE: Colony Count: >=100000

## 2012-07-02 LAB — CBC
Hemoglobin: 10.7 g/dL — ABNORMAL LOW (ref 12.0–15.0)
MCH: 27.6 pg (ref 26.0–34.0)
RBC: 3.87 MIL/uL (ref 3.87–5.11)

## 2012-07-02 LAB — BASIC METABOLIC PANEL
CO2: 12 mEq/L — ABNORMAL LOW (ref 19–32)
Calcium: 7.6 mg/dL — ABNORMAL LOW (ref 8.4–10.5)
Glucose, Bld: 136 mg/dL — ABNORMAL HIGH (ref 70–99)
Potassium: 3.7 mEq/L (ref 3.5–5.1)
Sodium: 138 mEq/L (ref 135–145)

## 2012-07-02 MED ORDER — PHENOL 1.4 % MT LIQD
1.0000 | OROMUCOSAL | Status: DC | PRN
  Filled 2012-07-02: qty 177

## 2012-07-02 MED ORDER — HYOSCYAMINE SULFATE 0.125 MG PO TABS
0.1250 mg | ORAL_TABLET | ORAL | Status: DC | PRN
  Administered 2012-07-02 – 2012-07-04 (×3): 0.125 mg via ORAL
  Filled 2012-07-02: qty 1

## 2012-07-02 MED ORDER — MENTHOL 3 MG MT LOZG
1.0000 | LOZENGE | OROMUCOSAL | Status: DC | PRN
  Filled 2012-07-02: qty 9

## 2012-07-02 MED ORDER — BACITRACIN-NEOMYCIN-POLYMYXIN 400-5-5000 EX OINT: 1.0000 "application " | TOPICAL_OINTMENT | Freq: Three times a day (TID) | CUTANEOUS | Status: DC | PRN

## 2012-07-02 MED ORDER — ZOLPIDEM TARTRATE 5 MG PO TABS: 5.0000 mg | ORAL_TABLET | Freq: Every evening | ORAL | Status: DC | PRN

## 2012-07-02 MED ORDER — ACETAMINOPHEN 325 MG PO TABS: 650.0000 mg | ORAL_TABLET | ORAL | Status: DC | PRN

## 2012-07-02 MED ORDER — HYOSCYAMINE SULFATE 0.125 MG SL SUBL: 0.1250 mg | SUBLINGUAL_TABLET | SUBLINGUAL | Status: DC | PRN

## 2012-07-02 MED ORDER — OXYBUTYNIN CHLORIDE 5 MG PO TABS
5.0000 mg | ORAL_TABLET | Freq: Four times a day (QID) | ORAL | Status: DC | PRN
  Administered 2012-07-02 – 2012-07-05 (×5): 5 mg via ORAL
  Filled 2012-07-02 (×3): qty 1

## 2012-07-02 MED ORDER — POTASSIUM CHLORIDE CRYS ER 20 MEQ PO TBCR
20.0000 meq | EXTENDED_RELEASE_TABLET | Freq: Two times a day (BID) | ORAL | Status: DC
  Administered 2012-07-02 (×2): 20 meq via ORAL
  Filled 2012-07-02 (×4): qty 1

## 2012-07-02 NOTE — Anesthesia Postprocedure Evaluation (Signed)
  Anesthesia Post-op Note  Patient: Phyllis House  Procedure(s) Performed: Procedure(s) (LRB): CYSTOSCOPY WITH RETROGRADE PYELOGRAM/URETERAL STENT PLACEMENT (Left)  Patient Location: PACU  Anesthesia Type: General  Level of Consciousness: awake and alert   Airway and Oxygen Therapy: Patient Spontanous Breathing  Post-op Pain: mild  Post-op Assessment: Post-op Vital signs reviewed, Patient's Cardiovascular Status Stable, Respiratory Function Stable, Patent Airway and No signs of Nausea or vomiting  Last Vitals:  Filed Vitals:   07/02/12 1030  BP: 107/70  Pulse: 75  Temp: 36.2 C  Resp: 18    Post-op Vital Signs: stable   Complications: No apparent anesthesia complications

## 2012-07-02 NOTE — Care Management Note (Addendum)
    Page 1 of 1   07/02/2012     12:12:39 PM   CARE MANAGEMENT NOTE 07/02/2012  Patient:  Phyllis House, Phyllis House   Account Number:  [id number]  Date Initiated:  07/02/2012  Documentation initiated by:  Lanier Clam  Subjective/Objective Assessment:   ADMITTED W/ L URETERAL CALCULUS.     Action/Plan:   FROM HOME.WILL SOON GET A PCP.HAS UROLOGIST.HAS PHARMACY.   Anticipated DC Date:  07/03/2012   Anticipated DC Plan:  HOME/SELF CARE      DC Planning Services  CM consult      Choice offered to / List presented to:             Status of service:  In process, will continue to follow Medicare Important Message given?   (If response is "NO", the following Medicare IM given date fields will be blank) Date Medicare IM given:   Date Additional Medicare IM given:    Discharge Disposition:    Per UR Regulation:  Reviewed for med. necessity/level of care/duration of stay  If discussed at Long Length of Stay Meetings, dates discussed:    Comments:  07/02/12 [facility] RN,BSN NCM [phone]

## 2012-07-02 NOTE — Progress Notes (Signed)
Urology Progress Note  Subjective:     No acute urologic events overnight. Positive early ambulation. Patient able to void after catheter discontinued this morning. Positive bladder spasms and left flank spasms.  I explained to the patient that her platelets are slightly low this morning. We discussed that Lovenox can cause this, alternatively this could be due to another cause. We discussed anticoagulation and the risk of DVT and PE. The patient states that she's been ambulating yesterday. I explained the risk of DVT or discussed prophylaxis including SCDs and early ambulation. At this point, I do not see that she is at increased risk for DVT and she is ambulating and wearing her SCDs while in bed. After discussing the risk and benefit she wishes to discontinue Lovenox.  Today we discussed the management of urinary stones. I showed her her CT scan. This revealed the known left ureter stone which is 8 mm in size. It also revealed bilateral calcifications in her kidneys. I explained that these could be calcifications within the parenchyma of her kidneys versus actual stones in the kidney collecting system. 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 These options include observation, ureteroscopy, shockwave lithotripsy, and PCNL. We discussed which options are relevant to these particular stones. We discussed the natural history of stones as well as the complications of untreated stones and the impact on quality of life without treatment as well as with each of the above listed treatments. We also discussed the efficacy of each treatment in its ability to clear the stone burden. With any of these management options I discussed the signs and symptoms of infection and the need for emergent treatment should these be experienced. For each option we discussed the ability of each procedure to clear the patient of their stone burden.  For observation I described the risks which  include but are not limited to silent renal damage, life-threatening infection, need for emergent surgery, failure to pass stone, and pain.  For ureteroscopy I described the risks which include, but are not limited to, heart attack, stroke, pulmonary embolus, death, bleeding, infection, damage to contiguous structures, positioning injury, ureteral stricture, ureteral avulsion, ureteral injury, need for ureteral stent, inability to perform ureteroscopy, need for an interval procedure, inability to clear stone burden, stent discomfort and pain.  For shockwave lithotripsy I described the risks which include arrhythmia, kidney contusion, kidney hemorrhage, need for transfusion, long-term risk of diabetes or hypertension, back discomfort, flank ecchymosis, flank abrasion, inability to break up stone, inability to pass stone fragments, Steinstrasse, infection associated with obstructing stones, need for different surgical procedure, need for repeat shockwave lithotripsy, and death.  For PCNL I described the risks including heart attack, sure, pulmonary embolus, death, positioning injury, pneumothorax, hydrothorax, need for chest tube, inability to clear stone burden, renal laceration, arterial venous fistula or malformation, need for embolization of kidney, loss of kidney or renal function, need for repeat procedure, need for prolonged nephrostomy tube, ureteral avulsion, fistula.  ROS: Negative: chest pain, SOB  Objective:  Patient Vitals for the past 24 hrs:  BP Temp Temp src Pulse Resp SpO2 Height Weight  07/02/12 0520 98/54 mmHg 98.3 F (36.8 C) Oral 66 18 100 % - -  07/02/12 0147 89/54 mmHg 98 F (36.7 C) - 64 18 98 % - -  07/01/12 2029 - 98 F (36.7 C) Oral 88 - 99 % - -  07/01/12 1543 100/61 mmHg 98.8 F (37.1 C) Oral 92 18 98 % 5'  2" (1.575 m) 83.7 kg (184 lb 8.4 oz)  07/01/12 1515 92/57 mmHg 99.8 F (37.7 C) - 106 21 98 % - -  07/01/12 1500 106/60 mmHg - - 101 20 99 % - -  07/01/12 1445  108/62 mmHg - - 101 18 100 % - -  07/01/12 1430 - 100.7 F (38.2 C) - 105 18 99 % - -  07/01/12 1044 113/61 mmHg 99 F (37.2 C) Oral 86 - 99 % - -  07/01/12 0902 95/54 mmHg 100.9 F (38.3 C) Oral 87 - 97 % - -  07/01/12 0857 - - - - - 96 % - -    Physical Exam: General:  No acute distress, awake Cardiovascular:    [x]   S1/S2 present, RRR  []   Irregularly irregular Chest:  CTA-B Abdomen:               []  Soft, appropriately TTP  [x]  Soft, NTTP  []  Soft, appropriately TTP, incision(s) clean/dry/intact  Genitourinary: No foley     I/O last 3 completed shifts: In: 3690 [I.V.:3590; IV Piggyback:100] Out: 2850 [Urine:2850]  Recent Labs     07/01/12  0950  07/02/12  0435  HGB  12.4  10.7*  WBC  15.0*  20.5*  PLT  120*  93*    Recent Labs     07/01/12  0950   07/01/12  1930  07/02/12  0435  NA  134*   --    --   138  K  2.5*   < >  3.1*  3.7  CL  109   --    --   117*  CO2  13*   --    --   12*  BUN  16   --    --   18  CREATININE  1.35*   --    --   1.11*  CALCIUM  8.0*   --    --   7.6*  GFRNONAA  49*   --    --   63*  GFRAA  57*   --    --   73*   < > = values in this interval not displayed.     No results found for this basename: PT, INR, APTT,  in the last 72 hours   No components found with this basename: ABG,     Length of stay: 1 days.  Assessment: Left proximal ureter stone. UTI. POD#1 Left ureter stent placement.   Plan: Urine culture growing E coli; sensitivities pending. Continue IV rocephin. Continue IV fluids.  Add levsin & oxybutynin PRN bladder spasms.  D/c lovenox; continue ambulation and SCD's.  Schedule for cystoscopy, left retrograde pyelogram, left ureteroscopy, laser lithotripsy, and left ureter stent exchange as an outpatient.  Discontinue telemetry as her vitals are stable and her clinical picture has improved.  Disposition: possible discharge home tomorrow.  Natalia Leatherwood, MD [phone]

## 2012-07-03 LAB — BASIC METABOLIC PANEL
BUN: 14 mg/dL (ref 6–23)
BUN: 13 mg/dL (ref 6–23)
CO2: 10 mEq/L — CL (ref 19–32)
GFR calc non Af Amer: 63 mL/min — ABNORMAL LOW (ref >90)
Glucose, Bld: 88 mg/dL (ref 70–99)
Glucose, Bld: 79 mg/dL (ref 70–99)
Potassium: 3.2 mEq/L — ABNORMAL LOW (ref 3.5–5.1)
Potassium: 3.5 mEq/L (ref 3.5–5.1)
Sodium: 139 mEq/L (ref 135–145)

## 2012-07-03 LAB — CBC
Hemoglobin: 10.3 g/dL — ABNORMAL LOW (ref 12.0–15.0)
MCH: 27.4 pg (ref 26.0–34.0)
MCV: 81.1 fL (ref 78.0–100.0)
RBC: 3.76 MIL/uL — ABNORMAL LOW (ref 3.87–5.11)

## 2012-07-03 LAB — INFLUENZA PANEL BY PCR (TYPE A & B): Influenza A By PCR: NEGATIVE

## 2012-07-03 MED ORDER — IBUPROFEN 800 MG PO TABS
400.0000 mg | ORAL_TABLET | Freq: Four times a day (QID) | ORAL | Status: DC | PRN
  Administered 2012-07-03 – 2012-07-05 (×5): 400 mg via ORAL
  Filled 2012-07-03 (×5): qty 1

## 2012-07-03 MED ORDER — POTASSIUM CHLORIDE 10 MEQ/100ML IV SOLN
10.0000 meq | INTRAVENOUS | Status: AC
  Administered 2012-07-03 (×2): 10 meq via INTRAVENOUS
  Filled 2012-07-03 (×2): qty 100

## 2012-07-03 MED ORDER — POTASSIUM CHLORIDE CRYS ER 20 MEQ PO TBCR
20.0000 meq | EXTENDED_RELEASE_TABLET | Freq: Three times a day (TID) | ORAL | Status: DC
  Administered 2012-07-03 – 2012-07-05 (×9): 20 meq via ORAL
  Filled 2012-07-03 (×9): qty 1

## 2012-07-03 MED ORDER — MAGNESIUM OXIDE 400 (241.3 MG) MG PO TABS
400.0000 mg | ORAL_TABLET | Freq: Two times a day (BID) | ORAL | Status: DC
  Administered 2012-07-03 – 2012-07-05 (×6): 400 mg via ORAL
  Filled 2012-07-03 (×6): qty 1

## 2012-07-03 MED ORDER — SODIUM BICARBONATE 650 MG PO TABS
650.0000 mg | ORAL_TABLET | Freq: Two times a day (BID) | ORAL | Status: DC
  Administered 2012-07-03 – 2012-07-05 (×6): 650 mg via ORAL
  Filled 2012-07-03 (×6): qty 1

## 2012-07-03 MED ORDER — CIPROFLOXACIN HCL 500 MG PO TABS
500.0000 mg | ORAL_TABLET | Freq: Two times a day (BID) | ORAL | Status: DC
  Administered 2012-07-03 – 2012-07-05 (×6): 500 mg via ORAL
  Filled 2012-07-03 (×7): qty 1

## 2012-07-03 MED ORDER — ACETAMINOPHEN 500 MG PO TABS
1000.0000 mg | ORAL_TABLET | Freq: Four times a day (QID) | ORAL | Status: DC | PRN
  Administered 2012-07-04 – 2012-07-05 (×2): 1000 mg via ORAL
  Filled 2012-07-03 (×2): qty 2

## 2012-07-03 NOTE — Progress Notes (Signed)
Urology Progress Note  Subjective:     No acute urologic events overnight. Has new onset of body aches and headache. Had flu shot this past October.  Headache frontal and top of head to back of head. It began yesterday morning. It is sharp in nature; she describes it like tension. Worse w/ bright light.  ROS: Negative: chest pain, SOB  Objective:  Patient Vitals for the past 24 hrs:  BP Temp Temp src Pulse Resp SpO2  07/03/12 0555 112/70 mmHg 99.3 F (37.4 C) Oral 71 18 98 %  07/02/12 2333 - 98.4 F (36.9 C) Oral - - -  07/02/12 2038 114/58 mmHg 100.1 F (37.8 C) Oral 84 20 98 %  07/02/12 1514 112/67 mmHg 99.3 F (37.4 C) Oral 85 18 100 %  07/02/12 1030 107/70 mmHg 97.1 F (36.2 C) Oral 75 18 100 %    Physical Exam: General:  No acute distress, awake Cardiovascular:    [x]   S1/S2 present, RRR  []   Irregularly irregular Chest:  CTA-B Abdomen:               []  Soft, appropriately TTP  [x]  Soft, NTTP  []  Soft, appropriately TTP, incision(s) clean/dry/intact  Genitourinary: No foley     I/O last 3 completed shifts: In: 5695 [P.O.:240; I.V.:5355; IV Piggyback:100] Out: 3950 [Urine:3950]  Recent Labs     07/02/12  0435  07/03/12  0438  HGB  10.7*  10.3*  WBC  20.5*  9.2  PLT  93*  88*    Recent Labs     07/02/12  0435  07/03/12  0438  NA  138  139  K  3.7  3.2*  CL  117*  116*  CO2  12*  10*  BUN  18  14  CREATININE  1.11*  1.10  CALCIUM  7.6*  7.3*  GFRNONAA  63*  63*  GFRAA  73*  73*     No results found for this basename: PT, INR, APTT,  in the last 72 hours   No components found with this basename: ABG,     Length of stay: 2 days.  Assessment: Left proximal ureter stone. UTI. POD#2 Left ureter stent placement.   Plan: Urine culture growing E coli; sensitive to cipro; will transition to PO cipro (we discussed the risks, benefits, alternatives, & side effects of this antibiotic)  Replace potassium IV & PO. Will also give magnesium and  sodium bicarb for low CO2. Recheck BMP today at 14:00.  Continue IV fluids.  Add acetaminophen & ibuprofen for headache.  Will test for flu.  Disposition: possible discharge home later today or tomorrw; difficult to determine at this time.  Natalia Leatherwood, MD [phone]

## 2012-07-03 NOTE — Progress Notes (Signed)
Patient has had resolution of her headache with ibuprofen. Body aches greatly improved.  Filed Vitals:   07/03/12 0555  BP: 112/70  Pulse: 71  Temp: 99.3 F (37.4 C)  Resp: 18     A/P: UTI. Left ureter stone. -Flu PCR pending. -I recommend continued hospitalization for IV fluids. -Will obtain BMP at 14:00 as planned.

## 2012-07-03 NOTE — Progress Notes (Signed)
Patient ambulated in hallway approximately 180 feet. Patient tolerated this activity well.

## 2012-07-03 NOTE — Progress Notes (Signed)
RN was notified by the lab. that CO2 level was 10; Potassium 3.2;Calcium 7.3;Platelets 16  RN notified PCP on call. No new orders at this time.

## 2012-07-03 NOTE — Progress Notes (Signed)
Patient ambulated in hallway approximately 180 feet. Patient had a mild headache but tolerated this activity well.

## 2012-07-04 ENCOUNTER — Inpatient Hospital Stay (HOSPITAL_COMMUNITY): Payer: Medicaid Other

## 2012-07-04 LAB — BASIC METABOLIC PANEL
BUN: 11 mg/dL (ref 6–23)
Chloride: 118 mEq/L — ABNORMAL HIGH (ref 96–112)
Creatinine, Ser: 0.96 mg/dL (ref 0.50–1.10)
GFR calc Af Amer: 87 mL/min — ABNORMAL LOW (ref >90)
Glucose, Bld: 93 mg/dL (ref 70–99)

## 2012-07-04 LAB — CBC
HCT: 29.2 % — ABNORMAL LOW (ref 36.0–46.0)
MCH: 27.3 pg (ref 26.0–34.0)
MCHC: 33.9 g/dL (ref 30.0–36.0)
MCV: 80.4 fL (ref 78.0–100.0)
RDW: 14.4 % (ref 11.5–15.5)

## 2012-07-04 NOTE — Progress Notes (Signed)
Urology Progress Note  Subjective:     No acute urologic events overnight. Headaches resolved. Has poor appetite. Negative nausea or emesis. Able to tolerate regular diet. States she feels much better today than the past several days.  I explained the findings of thromobcytopenia (which is stable), anemia, and leukopenia. I believe some of this is dilutional given her large diuresis. I explained that some of this could be due to bacteremia. There is no evidence of bacteremia based on blood cultures at this time.    ROS: Negative: chest pain, SOB  Objective:  Patient Vitals for the past 24 hrs:  BP Temp Temp src Pulse Resp SpO2  07/04/12 0531 113/67 mmHg 97.7 F (36.5 C) Oral 56 18 100 %  07/04/12 0140 108/66 mmHg 97.7 F (36.5 C) Oral 58 18 99 %  07/03/12 2109 108/65 mmHg 98.5 F (36.9 C) Oral 63 20 99 %  07/03/12 1806 119/62 mmHg - - - - -  07/03/12 1402 95/52 mmHg 98 F (36.7 C) Oral 68 20 99 %    Physical Exam: General:  No acute distress, awake Cardiovascular:    [x]   S1/S2 present, RRR  []   Irregularly irregular Chest:  CTA-B Abdomen:               []  Soft, appropriately TTP  [x]  Soft, NTTP  []  Soft, appropriately TTP, incision(s) clean/dry/intact  Genitourinary: No foley     I/O last 3 completed shifts: In: 5165 [P.O.:1080; I.V.:4085] Out: 9550 [Urine:9550]  Recent Labs     07/03/12  0438  07/04/12  0447  HGB  10.3*  9.9*  WBC  9.2  3.8*  PLT  88*  84*    Recent Labs     07/03/12  1334  07/04/12  0447  NA  138  137  K  3.5  3.8  CL  118*  118*  CO2  12*  12*  BUN  13  11  CREATININE  1.11*  0.96  CALCIUM  7.7*  7.7*  GFRNONAA  63*  75*  GFRAA  73*  87*     No results found for this basename: PT, INR, APTT,  in the last 72 hours   No components found with this basename: ABG,     Length of stay: 3 days.  Assessment: Left proximal ureter stone. UTI. Possible post-obstructive diuresis. POD#3 Left ureter stent placement.   Plan: Doing  well on PO cipro for UTI.  Will transition to PO fluids and see how she tolerates saline lock IV.  Flu test yesterday was negative.  Disposition: I recommend she remain in the hospital until we get further results on her blood cultures.  Natalia Leatherwood, MD [phone]

## 2012-07-04 NOTE — Progress Notes (Signed)
Patient complains of right flank pain. She states it is sharp in nature. It is greatly improved with oxycodone. She states it is similar to passage of a stone. KUB this afternoon shows no evidence of right side stone. Right renal US pending.  Filed Vitals:   07/04/12 1400  BP: 113/75  Pulse: 59  Temp: 98.2 F (36.8 C)  Resp: 16   Gen: NAD, well nourished CV: RRR, regular pulse Pulm: equal effort bilaterally Abd: soft, ND, mildly TTP RUQ   A/P: right flank pain -NPO for now. Await results of right renal US. -I will check out to Dr. Uvaldo Rising she needs a stent. If her [place] is negative, then she can have a regular diet.

## 2012-07-04 NOTE — Progress Notes (Signed)
Renal US results called to MD on call. No new orders received. Diet resumed. Will continue to monitor pt.   Arta Bruce [facility] 07/04/2012 7:00 PM

## 2012-07-05 LAB — BASIC METABOLIC PANEL
Calcium: 8.4 mg/dL (ref 8.4–10.5)
Creatinine, Ser: 0.97 mg/dL (ref 0.50–1.10)
GFR calc Af Amer: 85 mL/min — ABNORMAL LOW (ref >90)
GFR calc non Af Amer: 74 mL/min — ABNORMAL LOW (ref >90)
Sodium: 137 mEq/L (ref 135–145)

## 2012-07-05 LAB — CBC
MCH: 27 pg (ref 26.0–34.0)
MCHC: 34.1 g/dL (ref 30.0–36.0)
Platelets: 114 10*3/uL — ABNORMAL LOW (ref 150–400)
RBC: 4.23 MIL/uL (ref 3.87–5.11)
RDW: 13.9 % (ref 11.5–15.5)

## 2012-07-05 MED ORDER — SENNOSIDES-DOCUSATE SODIUM 8.6-50 MG PO TABS: 1.0000 | ORAL_TABLET | Freq: Two times a day (BID) | ORAL | Status: DC

## 2012-07-05 MED ORDER — CIPROFLOXACIN HCL 500 MG PO TABS: 500.0000 mg | ORAL_TABLET | Freq: Two times a day (BID) | ORAL | Status: DC

## 2012-07-05 MED ORDER — OXYCODONE-ACETAMINOPHEN 5-325 MG PO TABS: 1.0000 | ORAL_TABLET | ORAL | Status: DC | PRN

## 2012-07-05 MED ORDER — HYOSCYAMINE SULFATE 0.125 MG PO TABS: 0.1250 mg | ORAL_TABLET | ORAL | Status: DC | PRN

## 2012-07-05 MED ORDER — MAGNESIUM OXIDE 400 (241.3 MG) MG PO TABS: 400.0000 mg | ORAL_TABLET | Freq: Every day | ORAL | Status: DC

## 2012-07-05 MED ORDER — SODIUM BICARBONATE 650 MG PO TABS: 650.0000 mg | ORAL_TABLET | Freq: Two times a day (BID) | ORAL | Status: DC

## 2012-07-05 MED ORDER — OXYBUTYNIN CHLORIDE 5 MG PO TABS: 5.0000 mg | ORAL_TABLET | Freq: Four times a day (QID) | ORAL | Status: DC | PRN

## 2012-07-05 MED ORDER — POTASSIUM CHLORIDE CRYS ER 20 MEQ PO TBCR: 20.0000 meq | EXTENDED_RELEASE_TABLET | Freq: Two times a day (BID) | ORAL | Status: DC

## 2012-07-05 MED ORDER — ONDANSETRON HCL 4 MG PO TABS: 4.0000 mg | ORAL_TABLET | Freq: Four times a day (QID) | ORAL | Status: DC | PRN

## 2012-07-05 NOTE — Progress Notes (Signed)
The patient was given discharge orders. Patient understood orders as evidenced by asking the nurse questions about discharge. The PIVs were discontinued. The patient is going to a friend's house for the night and then back to residence in morning. Patient being transported by car to friend's house.

## 2012-07-05 NOTE — Progress Notes (Signed)
Urology Progress Note  Subjective:     No acute urologic events overnight. No more headaches. Right flank pain resolved. Right renal US negative for hydronephrosis.  Negative nausea or emesis. Eating better. Able to take in good PO fluids.       ROS: Negative: chest pain, SOB  Objective:  Patient Vitals for the past 24 hrs:  BP Temp Temp src Pulse Resp SpO2  07/05/12 0740 97/57 mmHg - - - - -  07/05/12 0500 93/56 mmHg 97.8 F (36.6 C) Oral 53 20 98 %  07/04/12 2201 100/52 mmHg 98.6 F (37 C) Oral 61 16 100 %  07/04/12 1400 113/75 mmHg 98.2 F (36.8 C) Oral 59 16 100 %    Physical Exam: General:  No acute distress, awake Cardiovascular:    [x]   S1/S2 present, RRR  []   Irregularly irregular Chest:  CTA-B Abdomen:               []  Soft, appropriately TTP  [x]  Soft, NTTP  []  Soft, appropriately TTP, incision(s) clean/dry/intact  Genitourinary: No foley     I/O last 3 completed shifts: In: 4287.5 [P.O.:740; I.V.:3547.5] Out: 8675 [Urine:8675]  Recent Labs     07/04/12  0447  07/05/12  0433  HGB  9.9*  11.4*  WBC  3.8*  3.3*  PLT  84*  114*    Recent Labs     07/04/12  0447  07/05/12  0433  NA  137  137  K  3.8  3.8  CL  118*  114*  CO2  12*  13*  BUN  11  12  CREATININE  0.96  0.97  CALCIUM  7.7*  8.4  GFRNONAA  75*  74*  GFRAA  87*  85*     No results found for this basename: PT, INR, APTT,  in the last 72 hours   No components found with this basename: ABG,   Blood cultures negative to date.     Length of stay: 4 days.  Assessment: Left proximal ureter stone. UTI. Possible post-obstructive diuresis. POD#4 Left ureter stent placement.   Plan: Doing well on PO cipro for UTI.  Leukopenia stable. Anemia and thrombocytopenia improved. Electrolytes stable.  Right renal US shows no evidence of obstruction.  Patient scheduled for left ureteroscopy on 07/15/12.  Disposition: likely discharge home this afternoon.  I reviewed the discharge  plan and instructions as well as medications with the patient.  Natalia Leatherwood, MD [phone]

## 2012-07-05 NOTE — Discharge Summary (Signed)
Physician Discharge Summary  Patient ID: Phyllis House MRN: [id number] DOB/AGE: 1975-09-16 37 y.o.  Admit date: 07/01/2012 Discharge date: 07/05/2012  Admission Diagnoses: Left ureter stone. Fever.   Discharge Diagnoses:  Principal Problem:   Ureteral calculus, left Active Problems:   Fever, unspecified   Infection of urinary tract   Discharged Condition: good  Hospital Course:  37 year old female presented to the ER with a known obstructing left ureter stone and fever. She had been in the ER the day prior or CT scan showed a left proximal ureter stone. Her UA was concerning for infection and she was started on antibiotic. She returned to the ER with fever and I was consulted to see the patient. I explained to her that I was concerned about her fever and possible urinary tract infection and we discussed management options for her obstructing stone. She elected to have cystoscopy, left retrograde pyelogram, and left ureter stent placement which was performed on the day of admission, 07/01/12. She was initially started on IV Rocephin and remained afebrile. Ligature returned Escherichia coli sensitive to ciprofloxacin and she was transitioned to oral antibiotic. She remained afebrile on the oral antibiotic. She did have what appeared to be some postobstructive diuresis as well as leukopenia, thrombocytopenia, and mild anemia. He stabilized, and improved over time. She also had several electrolyte abnormalities indicating acidosis with a low carbon dioxide, and hypokalemia. Her magnesium, potassium, and bicarbonate were replaced orally. The screw it did and the patient improved clinically. Her IV was saline locked and she was able to take adequate oral fluids. She did have right-sided flank pain and the stone was ruled out with KUB and renal ultrasound. Her blood cultures remained negative to date on 07/05/12. It was felt that she could be discharged home.  Consults: None  Significant Diagnostic  Studies: labs: CBC & BMP, and radiology: KUB: Negative right ureter stones. and Ultrasound: Negative right hydronephrosis.  Treatments: IV hydration, antibiotics: Cipro and ceftriaxone and surgery: Left ureter stent placement.  Discharge Exam: Blood pressure 97/57, pulse 53, temperature 97.8 F (36.6 C), temperature source Oral, resp. rate 20, height 5\' 2"  (1.575 m), weight 83.7 kg (184 lb 8.4 oz), last menstrual period 05/13/2012, SpO2 98.00%, not currently breastfeeding. See PE from progress note on date of discharge.  Disposition: 01-Home or Self Care     Medication List    STOP taking these medications       ketorolac 10 MG tablet  Commonly known as:  TORADOL      TAKE these medications       ciprofloxacin 500 MG tablet  Commonly known as:  CIPRO  Take 1 tablet (500 mg total) by mouth 2 (two) times daily.     hyoscyamine 0.125 MG tablet  Commonly known as:  LEVSIN, ANASPAZ  Take 1 tablet (0.125 mg total) by mouth every 4 (four) hours as needed for cramping (bladder spasms).     ibuprofen 200 MG tablet  Commonly known as:  ADVIL,MOTRIN  Take 400 mg by mouth 3 (three) times daily as needed for pain.     magnesium oxide 400 (241.3 MG) MG tablet  Commonly known as:  MAG-OX  Take 1 tablet (400 mg total) by mouth daily.     ondansetron 4 MG tablet  Commonly known as:  ZOFRAN  Take 1 tablet (4 mg total) by mouth every 6 (six) hours as needed for nausea.     oxybutynin 5 MG tablet  Commonly known as:  DITROPAN  Take 1  tablet (5 mg total) by mouth every 6 (six) hours as needed.     oxyCODONE-acetaminophen 5-325 MG per tablet  Commonly known as:  PERCOCET  Take 1-2 tablets by mouth every 4 (four) hours as needed for pain.     potassium chloride SA 20 MEQ tablet  Commonly known as:  K-DUR,KLOR-CON  Take 1 tablet (20 mEq total) by mouth 2 (two) times daily.     promethazine 25 MG tablet  Commonly known as:  PHENERGAN  Take 1 tablet (25 mg total) by mouth every 6 (six)  hours as needed for nausea.     senna-docusate 8.6-50 MG per tablet  Commonly known as:  Senokot-S  Take 1 tablet by mouth 2 (two) times daily.     sertraline 50 MG tablet  Commonly known as:  ZOLOFT  Take 50 mg by mouth every evening.     sodium bicarbonate 650 MG tablet  Take 1 tablet (650 mg total) by mouth 2 (two) times daily.           Follow-up Information   Follow up with Milford Cage, MD. (07/15/12 for surgery.)    Contact information:   [address] [place] FLOOR [address] [place] [address] [phone]       Signed: Milford Cage 07/05/2012, 8:51 AM

## 2012-07-08 LAB — CULTURE, BLOOD (ROUTINE X 2)
Culture: NO GROWTH
Culture: NO GROWTH

## 2012-07-09 ENCOUNTER — Encounter (HOSPITAL_BASED_OUTPATIENT_CLINIC_OR_DEPARTMENT_OTHER): Payer: Self-pay | Admitting: *Deleted

## 2012-07-10 ENCOUNTER — Encounter (HOSPITAL_BASED_OUTPATIENT_CLINIC_OR_DEPARTMENT_OTHER): Payer: Self-pay | Admitting: *Deleted

## 2012-07-10 ENCOUNTER — Other Ambulatory Visit: Payer: Self-pay | Admitting: Urology

## 2012-07-10 NOTE — Progress Notes (Signed)
NPO AFTER MN WITH EXCEPTION CLEAR LIQUIDS UNTIL 0830 (NO CREAM/ MILK PRODUCTS). ARRIVES AT 1330. NEEDS ISTAT AND URINE PREG.

## 2012-07-15 ENCOUNTER — Encounter (HOSPITAL_BASED_OUTPATIENT_CLINIC_OR_DEPARTMENT_OTHER)
Admission: RE | Disposition: A | Discharge status: Home / Self Care | Payer: Self-pay | Source: Ambulatory Visit | Attending: Urology

## 2012-07-15 ENCOUNTER — Ambulatory Visit (HOSPITAL_BASED_OUTPATIENT_CLINIC_OR_DEPARTMENT_OTHER): Payer: Medicaid Other | Admitting: Anesthesiology

## 2012-07-15 ENCOUNTER — Encounter (HOSPITAL_BASED_OUTPATIENT_CLINIC_OR_DEPARTMENT_OTHER): Payer: Self-pay | Admitting: Anesthesiology

## 2012-07-15 ENCOUNTER — Encounter (HOSPITAL_BASED_OUTPATIENT_CLINIC_OR_DEPARTMENT_OTHER): Payer: Self-pay | Admitting: *Deleted

## 2012-07-15 ENCOUNTER — Ambulatory Visit (HOSPITAL_BASED_OUTPATIENT_CLINIC_OR_DEPARTMENT_OTHER)
Admission: RE | Admit: 2012-07-15 | Discharge: 2012-07-15 | Disposition: A | Discharge status: Home / Self Care | Payer: Medicaid Other | Source: Ambulatory Visit | Attending: Urology | Admitting: Urology

## 2012-07-15 DIAGNOSIS — E282 Polycystic ovarian syndrome: Secondary | ICD-10-CM | POA: Insufficient documentation

## 2012-07-15 DIAGNOSIS — R509 Fever, unspecified: Secondary | ICD-10-CM | POA: Insufficient documentation

## 2012-07-15 DIAGNOSIS — Z79899 Other long term (current) drug therapy: Secondary | ICD-10-CM | POA: Insufficient documentation

## 2012-07-15 DIAGNOSIS — N201 Calculus of ureter: Secondary | ICD-10-CM

## 2012-07-15 HISTORY — PX: CYSTOSCOPY WITH RETROGRADE PYELOGRAM, URETEROSCOPY AND STENT PLACEMENT: SHX5789

## 2012-07-15 HISTORY — DX: Migraine, unspecified, not intractable, without status migrainosus: G43.909

## 2012-07-15 HISTORY — PX: HOLMIUM LASER APPLICATION: SHX5852

## 2012-07-15 HISTORY — DX: Personal history of other infectious and parasitic diseases: Z86.19

## 2012-07-15 HISTORY — DX: Polycystic ovarian syndrome: E28.2

## 2012-07-15 HISTORY — DX: Personal history of urinary calculi: Z87.442

## 2012-07-15 LAB — POCT I-STAT 4, (NA,K, GLUC, HGB,HCT)
HCT: 40 % (ref 36.0–46.0)
Hemoglobin: 13.6 g/dL (ref 12.0–15.0)

## 2012-07-15 LAB — POCT PREGNANCY, URINE: Preg Test, Ur: NEGATIVE

## 2012-07-15 SURGERY — CYSTOURETEROSCOPY, WITH RETROGRADE PYELOGRAM AND STENT INSERTION
Anesthesia: General | Site: Ureter | Laterality: Left | Wound class: Clean Contaminated

## 2012-07-15 MED ORDER — HYOSCYAMINE SULFATE 0.125 MG PO TABS
0.1250 mg | ORAL_TABLET | ORAL | Status: DC | PRN
  Filled 2012-07-15: qty 1

## 2012-07-15 MED ORDER — PROMETHAZINE HCL 25 MG/ML IJ SOLN
6.2500 mg | INTRAMUSCULAR | Status: DC | PRN
  Filled 2012-07-15: qty 1

## 2012-07-15 MED ORDER — EPHEDRINE SULFATE 50 MG/ML IJ SOLN
INTRAMUSCULAR | Status: DC | PRN
  Administered 2012-07-15: 5 mg via INTRAVENOUS
  Administered 2012-07-15: 10 mg via INTRAVENOUS

## 2012-07-15 MED ORDER — HYOSCYAMINE SULFATE 0.125 MG SL SUBL
0.1250 mg | SUBLINGUAL_TABLET | SUBLINGUAL | Status: DC | PRN
  Administered 2012-07-15: 0.125 mg via SUBLINGUAL
  Filled 2012-07-15: qty 1

## 2012-07-15 MED ORDER — HYDROMORPHONE HCL PF 1 MG/ML IJ SOLN
0.2500 mg | INTRAMUSCULAR | Status: DC | PRN
  Filled 2012-07-15: qty 1

## 2012-07-15 MED ORDER — PROPOFOL 10 MG/ML IV BOLUS
INTRAVENOUS | Status: DC | PRN
  Administered 2012-07-15: 200 mg via INTRAVENOUS

## 2012-07-15 MED ORDER — MIDAZOLAM HCL 5 MG/5ML IJ SOLN
INTRAMUSCULAR | Status: DC | PRN
  Administered 2012-07-15 (×2): 1 mg via INTRAVENOUS

## 2012-07-15 MED ORDER — ONDANSETRON HCL 4 MG/2ML IJ SOLN
INTRAMUSCULAR | Status: DC | PRN
  Administered 2012-07-15: 4 mg via INTRAVENOUS

## 2012-07-15 MED ORDER — IOHEXOL 350 MG/ML SOLN
INTRAVENOUS | Status: DC | PRN
  Administered 2012-07-15: 20 mL

## 2012-07-15 MED ORDER — OXYCODONE HCL 5 MG/5ML PO SOLN
5.0000 mg | Freq: Once | ORAL | Status: AC | PRN
  Filled 2012-07-15: qty 5

## 2012-07-15 MED ORDER — SODIUM CHLORIDE 0.9 % IR SOLN
Status: DC | PRN
  Administered 2012-07-15: 6000 mL

## 2012-07-15 MED ORDER — LIDOCAINE HCL 2 % EX GEL
CUTANEOUS | Status: DC | PRN
  Administered 2012-07-15: 1

## 2012-07-15 MED ORDER — CEPHALEXIN 500 MG PO CAPS: 500.0000 mg | ORAL_CAPSULE | Freq: Three times a day (TID) | ORAL | Status: DC

## 2012-07-15 MED ORDER — CEFAZOLIN SODIUM-DEXTROSE 2-3 GM-% IV SOLR
2.0000 g | INTRAVENOUS | Status: AC
  Administered 2012-07-15: 2 g via INTRAVENOUS
  Filled 2012-07-15: qty 50

## 2012-07-15 MED ORDER — DEXAMETHASONE SODIUM PHOSPHATE 4 MG/ML IJ SOLN
INTRAMUSCULAR | Status: DC | PRN
  Administered 2012-07-15: 8 mg via INTRAVENOUS

## 2012-07-15 MED ORDER — LACTATED RINGERS IV SOLN
INTRAVENOUS | Status: DC
  Administered 2012-07-15: 100 mL/h via INTRAVENOUS
  Administered 2012-07-15 (×2): via INTRAVENOUS
  Filled 2012-07-15: qty 1000

## 2012-07-15 MED ORDER — FENTANYL CITRATE 0.05 MG/ML IJ SOLN
INTRAMUSCULAR | Status: DC | PRN
  Administered 2012-07-15: 50 ug via INTRAVENOUS
  Administered 2012-07-15 (×3): 25 ug via INTRAVENOUS

## 2012-07-15 MED ORDER — OXYCODONE HCL 5 MG PO TABS
5.0000 mg | ORAL_TABLET | Freq: Once | ORAL | Status: AC | PRN
  Administered 2012-07-15: 5 mg via ORAL
  Filled 2012-07-15: qty 1

## 2012-07-15 MED ORDER — ACETAMINOPHEN 10 MG/ML IV SOLN
1000.0000 mg | Freq: Once | INTRAVENOUS | Status: DC | PRN
  Filled 2012-07-15: qty 100

## 2012-07-15 MED ORDER — LIDOCAINE HCL (CARDIAC) 20 MG/ML IV SOLN
INTRAVENOUS | Status: DC | PRN
  Administered 2012-07-15: 75 mg via INTRAVENOUS

## 2012-07-15 MED ORDER — MEPERIDINE HCL 25 MG/ML IJ SOLN
6.2500 mg | INTRAMUSCULAR | Status: DC | PRN
  Filled 2012-07-15: qty 1

## 2012-07-15 MED ORDER — BELLADONNA ALKALOIDS-OPIUM 16.2-60 MG RE SUPP
RECTAL | Status: DC | PRN
  Administered 2012-07-15: 1 via RECTAL

## 2012-07-15 SURGICAL SUPPLY — 46 items
ADAPTER CATH URET PLST 4-6FR (CATHETERS) IMPLANT
ADPR CATH URET STRL DISP 4-6FR (CATHETERS)
BAG DRAIN URO-CYSTO SKYTR STRL (DRAIN) ×2 IMPLANT
BAG DRN UROCATH (DRAIN) ×1
BASKET LASER NITINOL 1.9FR (BASKET) IMPLANT
BASKET STNLS GEMINI 4WIRE 3FR (BASKET) IMPLANT
BASKET ZERO TIP NITINOL 2.4FR (BASKET) ×1 IMPLANT
BRUSH URET BIOPSY 3F (UROLOGICAL SUPPLIES) IMPLANT
BSKT STON RTRVL 120 1.9FR (BASKET)
BSKT STON RTRVL GEM 120X11 3FR (BASKET)
BSKT STON RTRVL ZERO TP 2.4FR (BASKET) ×1
CANISTER SUCT LVC 12 LTR MEDI- (MISCELLANEOUS) ×1 IMPLANT
CATH CLEAR GEL 3F BACKSTOP (CATHETERS) IMPLANT
CATH INTERMIT  6FR 70CM (CATHETERS) IMPLANT
CATH URET 5FR 28IN CONE TIP (BALLOONS)
CATH URET 5FR 28IN OPEN ENDED (CATHETERS) ×1 IMPLANT
CATH URET 5FR 70CM CONE TIP (BALLOONS) IMPLANT
CATH URET DUAL LUMEN 6-10FR 50 (CATHETERS) IMPLANT
CLOTH BEACON ORANGE TIMEOUT ST (SAFETY) ×2 IMPLANT
DRAPE CAMERA CLOSED 9X96 (DRAPES) ×2 IMPLANT
ELECT REM PT RETURN 9FT ADLT (ELECTROSURGICAL)
ELECTRODE REM PT RTRN 9FT ADLT (ELECTROSURGICAL) IMPLANT
GLOVE BIO SURGEON STRL SZ7 (GLOVE) ×2 IMPLANT
GLOVE ECLIPSE 7.0 STRL STRAW (GLOVE) ×3 IMPLANT
GLOVE INDICATOR 7.5 STRL GRN (GLOVE) ×2 IMPLANT
GLOVE SKINSENSE NS SZ7.0 (GLOVE) ×1
GLOVE SKINSENSE STRL SZ7.0 (GLOVE) IMPLANT
GOWN PREVENTION PLUS LG XLONG (DISPOSABLE) ×3 IMPLANT
GUIDEWIRE 0.038 PTFE COATED (WIRE) IMPLANT
GUIDEWIRE ANG ZIPWIRE 038X150 (WIRE) IMPLANT
GUIDEWIRE STR DUAL SENSOR (WIRE) ×3 IMPLANT
IV NS IRRIG 3000ML ARTHROMATIC (IV SOLUTION) ×3 IMPLANT
KIT BALLIN UROMAX 15FX10 (LABEL) IMPLANT
KIT BALLN UROMAX 15FX4 (MISCELLANEOUS) IMPLANT
KIT BALLN UROMAX 26 75X4 (MISCELLANEOUS)
LASER FIBER DISP (UROLOGICAL SUPPLIES) ×1 IMPLANT
NS IRRIG 500ML POUR BTL (IV SOLUTION) ×1 IMPLANT
PACK CYSTOSCOPY (CUSTOM PROCEDURE TRAY) ×2 IMPLANT
SET HIGH PRES BAL DIL (LABEL)
SHEATH ACCESS URETERAL 38CM (SHEATH) ×1 IMPLANT
SHEATH ACCESS URETERAL 54CM (SHEATH) IMPLANT
SHEATH URET ACCESS 12FR/35CM (UROLOGICAL SUPPLIES) IMPLANT
SHEATH URET ACCESS 12FR/55CM (UROLOGICAL SUPPLIES) IMPLANT
STENT URET 6FRX24 CONTOUR (STENTS) ×1 IMPLANT
SYRINGE IRR TOOMEY STRL 70CC (SYRINGE) IMPLANT
WATER STERILE IRR 500ML POUR (IV SOLUTION) ×1 IMPLANT

## 2012-07-15 NOTE — H&P (Signed)
Urology History and Physical Exam  CC: Left ureter stone  HPI: 37 year old female presents with left ureter stone. This is a chronic problem. She presented to the ER 07/01/12 with fever and a left ureter stone. It is 8 mm in size. It is located in the left proximal ureter. Nothing makes it better or worse. This was associated with UTI and high fever. She had a left ureter stent placed 07/01/12 and was admitted post-op for evaluation . Her blood cultures were negative and her UTI was treated appropriately. We discussed management options and she has elected for ureteroscopy. We discussed the risks/benefits/alternatives/likelihood of achieving goals. We will proceed with cystoscopy, left retrograde pyelogram, left ureteroscopy, laser lithotripsy, and left ureter stent removal, possible left ureter stent placement.   PMH: Past Medical History  Diagnosis Date  . H/O varicella   . Depression     PP  . Left ureteral calculus   . Migraine   . History of herpes genitalis     LAST BREAK OUT 6 YRS AGO  . History of kidney stones   . Renal calculi     BILATERAL  . Polycystic ovarian syndrome     PSH: Past Surgical History  Procedure Laterality Date  . Cystoscopy w/ ureteral stent placement Left 07/01/2012    Procedure: CYSTOSCOPY WITH RETROGRADE PYELOGRAM/URETERAL STENT PLACEMENT;  Surgeon: Milford Cage, MD;  Location: [facility];  Service: Urology;  Laterality: Left;  . Cystoscopy/retrograde/ureteroscopy/stone extraction with basket  AGE 89  . Tonsillectomy  AS CHILD    Allergies: Allergies  Allergen Reactions  . Adhesive (Tape) Rash    Medications: No prescriptions prior to admission     Social History: History   Social History  . Marital Status: Divorced    Spouse Name: N/A    Number of Children: N/A  . Years of Education: N/A   Occupational History  . Not on file.   Social History Main Topics  . Smoking status: Never Smoker   . Smokeless tobacco: Never Used  .  Alcohol Use: No  . Drug Use: No  . Sexually Active: Not on file   Other Topics Concern  . Not on file   Social History Narrative  . No narrative on file    Family History: Family History  Problem Relation Age of Onset  . Thyroid disease Mother   . Depression Mother   . Cancer Father     leaukemia  . Thyroid disease Brother   . Diabetes Maternal Aunt   . Cancer Maternal Grandmother     GI  . Diabetes Maternal Grandfather   . Heart disease Paternal Grandmother     Review of Systems: Positive: None Negative: Fever, Chest pain, or SOB.  A further 10 point review of systems was negative except what is listed in the HPI.  Physical Exam: Filed Vitals:   07/15/12 1344  BP: 104/65  Pulse: 57  Temp: 97.3 F (36.3 C)  Resp: 16    General: No acute distress.  Awake. Head:  Normocephalic.  Atraumatic. ENT:  EOMI.  Mucous membranes moist Neck:  Supple.  No lymphadenopathy. CV:  S1 present. S2 present. Regular rate. Pulmonary: Equal effort bilaterally.  Clear to auscultation bilaterally. Abdomen: Soft.  Non- tender to palpation. Skin:  Normal turgor.  No visible rash. Extremity: No gross deformity of bilateral upper extremities.  No gross deformity of    bilateral lower extremities. Neurologic: Alert. Appropriate mood.    Studies:  No results found  for this basename: HGB, WBC, PLT,  in the last 72 hours  No results found for this basename: NA, K, CL, CO2, BUN, CREATININE, CALCIUM, MAGNESIUM, GFRNONAA, GFRAA,  in the last 72 hours   No results found for this basename: PT, INR, APTT,  in the last 72 hours   No components found with this basename: ABG,     Assessment:  Left ureter stone.  Plan: Urine pregnancy test negative today.  To OR for cystoscopy, left retrograde pyelogram, left ureteroscopy, laser lithotripsy, and left ureter stent removal, possible left ureter stent placement.

## 2012-07-15 NOTE — Anesthesia Postprocedure Evaluation (Signed)
Anesthesia Post Note  Patient: Phyllis House  Procedure(s) Performed: Procedure(s) (LRB): CYSTOSCOPY WITH left RETROGRADE PYELOGRAM,left  URETEROSCOPY AND left STENT exchange  (Left) HOLMIUM LASER APPLICATION (Left)  Anesthesia type: General  Patient location: PACU  Post pain: Pain level controlled  Post assessment: Post-op Vital signs reviewed  Last Vitals: BP 110/69  Pulse 64  Temp(Src) 36.1 C (Oral)  Resp 16  Ht 5\' 2"  (1.575 m)  Wt 178 lb (80.74 kg)  BMI 32.55 kg/m2  SpO2 98%  LMP 05/13/2012  Breastfeeding? No  Post vital signs: Reviewed  Level of consciousness: sedated  Complications: No apparent anesthesia complications

## 2012-07-15 NOTE — Op Note (Signed)
Urology Operative Report  Date of Procedure: 07/15/12  Surgeon: Natalia Leatherwood, MD Assistant: None  Preoperative Diagnosis: Left ureter stone Postoperative Diagnosis:  Same  Procedure(s): Left ureteroscopy Laser lithotripsy Left retrograde pyelogram with interpretation  Left ureter stent removal   left ureter stent placement  Estimated blood loss: Minimal  Specimen: Stones sent to [facility] lab for chemical analysis.  Drains: None  Complications: None  Findings: Left urolithiasis. Multiple Randall's plaques.   History of present illness: 37 year old female presents today for a left ureter stone which was associated with urinary tract infection. She presents today having had that infection treated and with a left ureter stent in place. She presents for left ureteroscopy.   Procedure in detail: After informed consent was obtained, the patient was taken to the operating room. They were placed in the supine position. SCDs were turned on and in place. IV antibiotics were infused, and general anesthesia was induced. A timeout was performed in which the correct patient, surgical site, and procedure were identified and agreed upon by the team. A belladonna opium suppository was placed into her rectum.   The patient was placed in a dorsolithotomy position, making sure to pad all pertinent neurovascular pressure points. The genitals were prepped and draped in the usual sterile fashion.  A rigid cystoscope was advanced through the urethra and into the bladder. The bladder was fully distended and evaluated in a systematic fashion; there were no lesions or tumors noted. Attention was turned to the left ureter orifice. A stent grasper was used to grasp the left ureter stent and pulled to the urethral meatus. This was cannulated with a sensor tip wire which placed through the stent and into the left renal pelvis on fluoroscopy with good curl. I then cannulated the ureter with a 5 [place] ureter  catheter and injected contrast to obtain a left retrograde pyelogram. There was no transition and no stricturing noted. It appeared that the left ureter stone had been pushed back into the left kidney. I then placed a second sensor tip wire and secured 1 wires the safety wire and used the other as a working wire. I then placed a 12-14 ureter access sheath under fluoroscopy over the working wire. This was placed up into the kidney with ease. Obturator and working wire were removed. A flexible digital ureteroscope was placed up the access sheath with ease into the left renal pelvis. All the calyces were then evaluated in a systematic fashion. There were noted to be multiple Randall's plaques. The large stone which had been in the proximal ureter was identified and lithotripsy was carried out with a holmium laser. I used a 200  holmium laser fiber at the settings of 0.5 J and 20 Hz. The stone broke very easily and the fragments were removed with a 0 tip Nitinol basket. There were several fragments which were too small to grasp with a 0 tip Nitinol basket which should washed down the ureter. I then removed the ureter access sheath and the ureter scope and visualized the entirety of the ureter. There was no injury or mucosal disruption noted. I then catheterized the left ureter with a 5 [place] ureter catheter and obtained another retrograde pyelogram which showed no extravasation of contrast. The safety wire was then loaded back through the cystoscope and a 6 x 24 double-J ureter stent with the tethering strings in place was inserted over the wire into the left renal pelvis. There was a good curl noted in the left renal pelvis  and a good curl in the bladder under direct visualization. The bladder was drained and I placed 10 cc of lidocaine jelly into the patient's urethra. The string was secured to the patient's body.  She will be instructed to remove the string this Thursday, 07/18/12. She will be given antibiotics to  cover for the instrumentation (keflex 500 mg).

## 2012-07-15 NOTE — Anesthesia Procedure Notes (Signed)
Procedure Name: LMA Insertion Date/Time: 07/15/2012 2:34 PM Performed by: Fran Lowes Pre-anesthesia Checklist: Patient identified, Emergency Drugs available, Suction available and Patient being monitored Patient Re-evaluated:Patient Re-evaluated prior to inductionOxygen Delivery Method: Circle System Utilized Preoxygenation: Pre-oxygenation with 100% oxygen Intubation Type: IV induction Ventilation: Mask ventilation without difficulty LMA: LMA inserted LMA Size: 4.0 Number of attempts: 1 Airway Equipment and Method: bite block Placement Confirmation: positive ETCO2 Tube secured with: Tape Dental Injury: Teeth and Oropharynx as per pre-operative assessment

## 2012-07-15 NOTE — Transfer of Care (Signed)
Immediate Anesthesia Transfer of Care Note  Patient: Phyllis House  Procedure(s) Performed: Procedure(s) (LRB): CYSTOSCOPY WITH left RETROGRADE PYELOGRAM,left  URETEROSCOPY AND left STENT exchange  (Left) HOLMIUM LASER APPLICATION (Left)  Patient Location: Patient transported to PACU with oxygen via face mask at 4 Liters / Min  Anesthesia Type: General  Level of Consciousness: awake and alert   Airway & Oxygen Therapy: Patient Spontanous Breathing and Patient connected to face mask oxygen  Post-op Assessment: Report given to PACU RN and Post -op Vital signs reviewed and stable  Post vital signs: Reviewed and stable  Dentition: Teeth and oropharynx remain in pre-op condition  Complications: No apparent anesthesia complications

## 2012-07-15 NOTE — Anesthesia Preprocedure Evaluation (Addendum)
Anesthesia Evaluation  Patient identified by MRN, date of birth, ID band Patient awake    Reviewed: Allergy & Precautions, H&P , NPO status , Patient's Chart, lab work & pertinent test results  Airway Mallampati: II TM Distance: >3 FB Neck ROM: Full    Dental no notable dental hx. (+) Dental Advisory Given   Pulmonary neg pulmonary ROS,  breath sounds clear to auscultation  Pulmonary exam normal       Cardiovascular negative cardio ROS  Rhythm:Regular Rate:Normal     Neuro/Psych  Headaches, PSYCHIATRIC DISORDERS Depression negative psych ROS   GI/Hepatic negative GI ROS, Neg liver ROS,   Endo/Other  negative endocrine ROS  Renal/GU negative Renal ROS     Musculoskeletal negative musculoskeletal ROS (+)   Abdominal   Peds  Hematology negative hematology ROS (+)   Anesthesia Other Findings   Reproductive/Obstetrics negative OB ROS                           Anesthesia Physical  Anesthesia Plan  ASA: I and emergent  Anesthesia Plan: General   Post-op Pain Management:    Induction: Intravenous  Airway Management Planned: LMA  Additional Equipment:   Intra-op Plan:   Post-operative Plan: Extubation in OR  Informed Consent: I have reviewed the patients History and Physical, chart, labs and discussed the procedure including the risks, benefits and alternatives for the proposed anesthesia with the patient or authorized representative who has indicated his/her understanding and acceptance.   Dental advisory given  Plan Discussed with: CRNA  Anesthesia Plan Comments:         Anesthesia Quick Evaluation

## 2012-07-16 ENCOUNTER — Encounter (HOSPITAL_BASED_OUTPATIENT_CLINIC_OR_DEPARTMENT_OTHER): Payer: Self-pay | Admitting: Urology

## 2012-12-26 ENCOUNTER — Encounter (HOSPITAL_COMMUNITY): Payer: Self-pay | Admitting: Emergency Medicine

## 2012-12-26 ENCOUNTER — Emergency Department (HOSPITAL_COMMUNITY)
Admission: EM | Admit: 2012-12-26 | Discharge: 2012-12-26 | Disposition: A | Discharge status: Home / Self Care | Payer: Medicaid Other | Attending: Emergency Medicine | Admitting: Emergency Medicine

## 2012-12-26 DIAGNOSIS — E876 Hypokalemia: Secondary | ICD-10-CM | POA: Diagnosis present

## 2012-12-26 DIAGNOSIS — Z8619 Personal history of other infectious and parasitic diseases: Secondary | ICD-10-CM | POA: Insufficient documentation

## 2012-12-26 DIAGNOSIS — F329 Major depressive disorder, single episode, unspecified: Secondary | ICD-10-CM | POA: Insufficient documentation

## 2012-12-26 DIAGNOSIS — Z9889 Other specified postprocedural states: Secondary | ICD-10-CM | POA: Insufficient documentation

## 2012-12-26 DIAGNOSIS — Z87442 Personal history of urinary calculi: Secondary | ICD-10-CM | POA: Insufficient documentation

## 2012-12-26 DIAGNOSIS — IMO0001 Reserved for inherently not codable concepts without codable children: Secondary | ICD-10-CM | POA: Insufficient documentation

## 2012-12-26 DIAGNOSIS — Z79899 Other long term (current) drug therapy: Secondary | ICD-10-CM | POA: Insufficient documentation

## 2012-12-26 DIAGNOSIS — F3289 Other specified depressive episodes: Secondary | ICD-10-CM | POA: Insufficient documentation

## 2012-12-26 DIAGNOSIS — R002 Palpitations: Secondary | ICD-10-CM | POA: Insufficient documentation

## 2012-12-26 DIAGNOSIS — Z8742 Personal history of other diseases of the female genital tract: Secondary | ICD-10-CM | POA: Insufficient documentation

## 2012-12-26 DIAGNOSIS — Z8679 Personal history of other diseases of the circulatory system: Secondary | ICD-10-CM | POA: Insufficient documentation

## 2012-12-26 LAB — POCT I-STAT, CHEM 8
BUN: 7 mg/dL (ref 6–23)
Calcium, Ion: 1.07 mmol/L — ABNORMAL LOW (ref 1.12–1.23)
Chloride: 100 mEq/L (ref 96–112)
Creatinine, Ser: 1 mg/dL (ref 0.50–1.10)

## 2012-12-26 LAB — COMPREHENSIVE METABOLIC PANEL
ALT: 20 U/L (ref 0–35)
Alkaline Phosphatase: 55 U/L (ref 39–117)
BUN: 9 mg/dL (ref 6–23)
CO2: 27 mEq/L (ref 19–32)
Chloride: 101 mEq/L (ref 96–112)
GFR calc Af Amer: >90 mL/min (ref >90)
GFR calc non Af Amer: 83 mL/min — ABNORMAL LOW (ref >90)
Glucose, Bld: 85 mg/dL (ref 70–99)
Potassium: 2.9 mEq/L — ABNORMAL LOW (ref 3.5–5.1)
Sodium: 135 mEq/L (ref 135–145)
Total Bilirubin: 0.2 mg/dL — ABNORMAL LOW (ref 0.3–1.2)

## 2012-12-26 LAB — CBC WITH DIFFERENTIAL/PLATELET
Hemoglobin: 14.2 g/dL (ref 12.0–15.0)
Lymphocytes Relative: 37 % (ref 12–46)
Lymphs Abs: 1.7 10*3/uL (ref 0.7–4.0)
MCV: 79.8 fL (ref 78.0–100.0)
Monocytes Relative: 8 % (ref 3–12)
Neutrophils Relative %: 50 % (ref 43–77)
Platelets: 171 10*3/uL (ref 150–400)
RBC: 5.14 MIL/uL — ABNORMAL HIGH (ref 3.87–5.11)
WBC: 4.7 10*3/uL (ref 4.0–10.5)

## 2012-12-26 LAB — URINALYSIS W MICROSCOPIC + REFLEX CULTURE
Bilirubin Urine: NEGATIVE
Ketones, ur: NEGATIVE mg/dL
Nitrite: NEGATIVE
Protein, ur: NEGATIVE mg/dL
Urobilinogen, UA: 0.2 mg/dL (ref 0.0–1.0)
pH: 7.5 (ref 5.0–8.0)

## 2012-12-26 MED ORDER — POTASSIUM CHLORIDE CRYS ER 20 MEQ PO TBCR
40.0000 meq | EXTENDED_RELEASE_TABLET | Freq: Once | ORAL | Status: AC
  Administered 2012-12-26: 40 meq via ORAL
  Filled 2012-12-26: qty 2

## 2012-12-26 MED ORDER — POTASSIUM CHLORIDE 10 MEQ/100ML IV SOLN
10.0000 meq | Freq: Once | INTRAVENOUS | Status: AC
  Administered 2012-12-26: 10 meq via INTRAVENOUS
  Filled 2012-12-26: qty 100

## 2012-12-26 NOTE — ED Provider Notes (Signed)
CSN: [id number]     Arrival date & time 12/26/12  1316 History   First MD Initiated Contact with Patient 12/26/12 1353     Chief Complaint  Patient presents with  . Abnormal Lab   (Consider location/radiation/quality/duration/timing/severity/associated sxs/prior Treatment) The history is provided by the patient.   the patient is a 37 year old female who presents with diffuse body cramps for one week. The patient was seen by her urologist today and found to have a low potassium, she was here for evaluation and supplementation. The patient states that she has had similar symptoms in the past with low potassium. The duration of her symptoms it is intermittent. The severity is noted to be mild to moderate. She has had occasional palpitations but denies fever, nausea, vomiting, diarrhea or other associated symptoms. Nothing has relieved her symptoms thus far. She has doubled up on her by mouth potassium supplementation for the last few days.  Past Medical History  Diagnosis Date  . H/O varicella   . Depression     PP  . Left ureteral calculus   . Migraine   . History of herpes genitalis     LAST BREAK OUT 6 YRS AGO  . History of kidney stones   . Renal calculi     BILATERAL  . Polycystic ovarian syndrome    Past Surgical History  Procedure Laterality Date  . Cystoscopy w/ ureteral stent placement Left 07/01/2012    Procedure: CYSTOSCOPY WITH RETROGRADE PYELOGRAM/URETERAL STENT PLACEMENT;  Surgeon: Milford Cage, MD;  Location: [facility];  Service: Urology;  Laterality: Left;  . Cystoscopy/retrograde/ureteroscopy/stone extraction with basket  AGE 56  . Tonsillectomy  AS CHILD  . Cystoscopy with retrograde pyelogram, ureteroscopy and stent placement Left 07/15/2012    Procedure: CYSTOSCOPY WITH left RETROGRADE PYELOGRAM,left  URETEROSCOPY AND left STENT exchange ;  Surgeon: Milford Cage, MD;  Location: [facility];  Service: Urology;  Laterality: Left;  LEFT URETER  STENT EXCHANGE    . Holmium laser application Left 07/15/2012    Procedure: HOLMIUM LASER APPLICATION;  Surgeon: Milford Cage, MD;  Location: [facility];  Service: Urology;  Laterality: Left;   Family History  Problem Relation Age of Onset  . Thyroid disease Mother   . Depression Mother   . Cancer Father     leaukemia  . Thyroid disease Brother   . Diabetes Maternal Aunt   . Cancer Maternal Grandmother     GI  . Diabetes Maternal Grandfather   . Heart disease Paternal Grandmother    History  Substance Use Topics  . Smoking status: Never Smoker   . Smokeless tobacco: Never Used  . Alcohol Use: No   OB History   Grav Para Term Preterm Abortions TAB SAB Ect Mult Living   2 2 2       2      Review of Systems  Constitutional: Negative for fever and fatigue.  HENT: Negative for congestion, drooling and neck pain.   Eyes: Negative for pain.  Respiratory: Negative for cough and shortness of breath.   Cardiovascular: Positive for palpitations (occasional). Negative for chest pain.  Gastrointestinal: Negative for nausea, vomiting, abdominal pain and diarrhea.  Genitourinary: Negative for dysuria and hematuria.  Musculoskeletal: Positive for myalgias. Negative for back pain and gait problem.  Skin: Negative for color change.  Neurological: Negative for dizziness and headaches.  Hematological: Negative for adenopathy.  Psychiatric/Behavioral: Negative for behavioral problems.  All other systems reviewed and are negative.  Allergies  Adhesive  Home Medications   Current Outpatient Rx  Name  Route  Sig  Dispense  Refill  . acetaminophen (TYLENOL) 500 MG tablet   Oral   Take 1,000 mg by mouth every 6 (six) hours as needed for pain.         Marland Kitchen ibuprofen (ADVIL,MOTRIN) 200 MG tablet   Oral   Take 400 mg by mouth every 8 (eight) hours as needed for pain.         . naproxen sodium (ANAPROX) 220 MG tablet   Oral   Take 220 mg by mouth 2 (two)  times daily as needed (for pain).         . potassium citrate (UROCIT-K) 10 MEQ (1080 MG) SR tablet   Oral   Take 20 mEq by mouth 2 (two) times daily.         . sertraline (ZOLOFT) 50 MG tablet   Oral   Take 50 mg by mouth every evening.          BP 133/83  Pulse 82  Temp(Src) 98.1 F (36.7 C) (Oral)  SpO2 99% Physical Exam  Nursing note and vitals reviewed. Constitutional: She is oriented to person, place, and time. She appears well-developed and well-nourished.  HENT:  Head: Normocephalic.  Mouth/Throat: No oropharyngeal exudate.  Eyes: Conjunctivae and EOM are normal. Pupils are equal, round, and reactive to light.  Neck: Normal range of motion. Neck supple.  Cardiovascular: Normal rate, regular rhythm, normal heart sounds and intact distal pulses.  Exam reveals no gallop and no friction rub.   No murmur heard. Pulmonary/Chest: Effort normal and breath sounds normal. No respiratory distress. She has no wheezes.  Abdominal: Soft. Bowel sounds are normal. There is no tenderness. There is no rebound and no guarding.  Musculoskeletal: Normal range of motion. She exhibits no edema and no tenderness.  Neurological: She is alert and oriented to person, place, and time.  Skin: Skin is warm and dry.  Psychiatric: She has a normal mood and affect. Her behavior is normal.    ED Course  Procedures (including critical care time) Labs Review Labs Reviewed  CBC WITH DIFFERENTIAL - Abnormal; Notable for the following:    RBC 5.14 (*)    Basophils Relative 2 (*)    All other components within normal limits  COMPREHENSIVE METABOLIC PANEL - Abnormal; Notable for the following:    Potassium 2.9 (*)    Total Bilirubin 0.2 (*)    GFR calc non Af Amer 83 (*)    All other components within normal limits  URINALYSIS W MICROSCOPIC + REFLEX CULTURE - Abnormal; Notable for the following:    Leukocytes, UA SMALL (*)    All other components within normal limits  POCT I-STAT, CHEM 8 -  Abnormal; Notable for the following:    Potassium 3.0 (*)    Calcium, Ion 1.07 (*)    All other components within normal limits   Imaging Review No results found.   Date: 12/26/2012  Rate: 56  Rhythm: sinus bradycardia  QRS Axis: normal  Intervals: normal  ST/T Wave abnormalities: nonspecific ST changes  Conduction Disutrbances:none  Narrative Interpretation: Mild non-spec ST dep's in inf leads unchanged  Old EKG Reviewed: unchanged   MDM   1. Hypokalemia    2:05 PM 37 y.o. female pw diffuse body cramping x 1 week. Found to have low potassium today and sent by her urologist for eval. Pt AFVSS here. C/o body cramps. K found to  be 3.0, pt on supplementation at home already. Will give po and IV supplement here.   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   8:34 PM: Will have pt continue to take 20 meq of potasium bid for the next 5 days and f/u w/ her urologist next Tuesday for repeat labs and referral to nephrologist. I have discussed the diagnosis/risks/treatment options with the patient and believe the pt to be eligible for discharge home to follow-up with her urologist next Tuesday for reck of her potassium. We also discussed returning to the ED immediately if new or worsening sx occur. We discussed the sx which are most concerning (e.g., worsening cramping, vomiting, diarrhea, fever) that necessitate immediate return. Any new prescriptions provided to the patient are listed below.  Discharge Medication List as of 12/26/2012  4:41 PM         Randa Spike Mort Sawyers, MD 12/26/12 2037

## 2012-12-26 NOTE — ED Notes (Signed)
Pt woodroff sent pt over due to he drawn labs on pt and she has low potassium and has cramping to hands and feet. Pt said she low potasssium before and is on 40 meq potassium at home

## 2013-02-26 ENCOUNTER — Encounter (HOSPITAL_COMMUNITY): Payer: Self-pay | Admitting: Emergency Medicine

## 2013-02-26 ENCOUNTER — Emergency Department (HOSPITAL_COMMUNITY)
Admission: EM | Admit: 2013-02-26 | Discharge: 2013-02-26 | Disposition: A | Discharge status: Home / Self Care | Payer: BC Managed Care – HMO | Source: Home / Self Care | Attending: Family Medicine | Admitting: Family Medicine

## 2013-02-26 DIAGNOSIS — R5382 Chronic fatigue, unspecified: Secondary | ICD-10-CM

## 2013-02-26 LAB — COMPREHENSIVE METABOLIC PANEL
CO2: 20 mEq/L (ref 19–32)
Calcium: 8.8 mg/dL (ref 8.4–10.5)
Creatinine, Ser: 0.75 mg/dL (ref 0.50–1.10)
GFR calc Af Amer: >90 mL/min (ref >90)
GFR calc non Af Amer: >90 mL/min (ref >90)
Glucose, Bld: 85 mg/dL (ref 70–99)
Total Protein: 7.2 g/dL (ref 6.0–8.3)

## 2013-02-26 LAB — CBC WITH DIFFERENTIAL/PLATELET
Basophils Absolute: 0.1 10*3/uL (ref 0.0–0.1)
Basophils Relative: 2 % — ABNORMAL HIGH (ref 0–1)
Eosinophils Absolute: 0.1 10*3/uL (ref 0.0–0.7)
Hemoglobin: 14.3 g/dL (ref 12.0–15.0)
MCH: 27.6 pg (ref 26.0–34.0)
MCHC: 34.8 g/dL (ref 30.0–36.0)
Monocytes Absolute: 0.4 10*3/uL (ref 0.1–1.0)
Monocytes Relative: 8 % (ref 3–12)
Neutro Abs: 2.6 10*3/uL (ref 1.7–7.7)
Neutrophils Relative %: 53 % (ref 43–77)
RDW: 13.7 % (ref 11.5–15.5)

## 2013-02-26 LAB — CK: Total CK: 95 U/L (ref 7–177)

## 2013-02-26 NOTE — ED Notes (Addendum)
C/o muscle ache and fatigue for six months now.  No treatments tried.  Patient is concerned since dad died at the age 38 to leukemia.  Patient states she has bruises on abd area.

## 2013-02-26 NOTE — ED Provider Notes (Signed)
CSN: [id number]     Arrival date & time 02/26/13  1209 History   First MD Initiated Contact with Patient 02/26/13 1408     Chief Complaint  Patient presents with  . Fatigue  . Muscle Pain   (Consider location/radiation/quality/duration/timing/severity/associated sxs/prior Treatment) Patient is a 37 y.o. female presenting with musculoskeletal pain. The history is provided by the patient.  Muscle Pain This is a chronic problem. The current episode started more than 1 week ago (53mo of myalgias, fatigue, bruising , kidney stones ,low potassium. concerned about mult sx and father died of leukemia.). The problem occurs constantly. The problem has been gradually worsening. Pertinent negatives include no chest pain and no abdominal pain.    Past Medical History  Diagnosis Date  . H/O varicella   . Depression     PP  . Left ureteral calculus   . Migraine   . History of herpes genitalis     LAST BREAK OUT 6 YRS AGO  . History of kidney stones   . Renal calculi     BILATERAL  . Polycystic ovarian syndrome    Past Surgical History  Procedure Laterality Date  . Cystoscopy w/ ureteral stent placement Left 07/01/2012    Procedure: CYSTOSCOPY WITH RETROGRADE PYELOGRAM/URETERAL STENT PLACEMENT;  Surgeon: Milford Cage, MD;  Location: [facility];  Service: Urology;  Laterality: Left;  . Cystoscopy/retrograde/ureteroscopy/stone extraction with basket  AGE 40  . Tonsillectomy  AS CHILD  . Cystoscopy with retrograde pyelogram, ureteroscopy and stent placement Left 07/15/2012    Procedure: CYSTOSCOPY WITH left RETROGRADE PYELOGRAM,left  URETEROSCOPY AND left STENT exchange ;  Surgeon: Milford Cage, MD;  Location: [facility];  Service: Urology;  Laterality: Left;  LEFT URETER STENT EXCHANGE    . Holmium laser application Left 07/15/2012    Procedure: HOLMIUM LASER APPLICATION;  Surgeon: Milford Cage, MD;  Location: [facility];  Service: Urology;   Laterality: Left;   Family History  Problem Relation Age of Onset  . Thyroid disease Mother   . Depression Mother   . Cancer Father     leaukemia  . Thyroid disease Brother   . Diabetes Maternal Aunt   . Cancer Maternal Grandmother     GI  . Diabetes Maternal Grandfather   . Heart disease Paternal Grandmother    History  Substance Use Topics  . Smoking status: Never Smoker   . Smokeless tobacco: Never Used  . Alcohol Use: No   OB History   Grav Para Term Preterm Abortions TAB SAB Ect Mult Living   2 2 2       2      Review of Systems  Constitutional: Positive for fatigue.  Cardiovascular: Negative for chest pain.  Gastrointestinal: Negative for abdominal pain.  Musculoskeletal: Positive for myalgias.  Skin: Negative.   Hematological: Bruises/bleeds easily.    Allergies  Adhesive  Home Medications   Current Outpatient Rx  Name  Route  Sig  Dispense  Refill  . acetaminophen (TYLENOL) 500 MG tablet   Oral   Take 1,000 mg by mouth every 6 (six) hours as needed for pain.         Marland Kitchen ibuprofen (ADVIL,MOTRIN) 200 MG tablet   Oral   Take 400 mg by mouth every 8 (eight) hours as needed for pain.         . naproxen sodium (ANAPROX) 220 MG tablet   Oral   Take 220 mg by mouth 2 (two) times daily  as needed (for pain).         . potassium citrate (UROCIT-K) 10 MEQ (1080 MG) SR tablet   Oral   Take 20 mEq by mouth 2 (two) times daily.         . sertraline (ZOLOFT) 50 MG tablet   Oral   Take 50 mg by mouth every evening.          BP 147/73  Pulse 61  Temp(Src) 98.5 F (36.9 C) (Oral)  Resp 20  SpO2 100% Physical Exam  Nursing note and vitals reviewed. Constitutional: She is oriented to person, place, and time. She appears well-developed and well-nourished. She appears distressed.  HENT:  Head: Normocephalic.  Eyes: Conjunctivae are normal. Pupils are equal, round, and reactive to light.  Neck: Normal range of motion. Neck supple. No thyromegaly  present.  Cardiovascular: Regular rhythm.   Pulmonary/Chest: Breath sounds normal.  Musculoskeletal: She exhibits no edema.  Lymphadenopathy:    She has no cervical adenopathy.  Neurological: She is alert and oriented to person, place, and time.  Skin: Skin is warm and dry.  Mult areas of ecchymosis on trunk and ext.    ED Course  Procedures (including critical care time) Labs Review Labs Reviewed  CBC WITH DIFFERENTIAL - Abnormal; Notable for the following:    RBC 5.18 (*)    Basophils Relative 2 (*)    All other components within normal limits  COMPREHENSIVE METABOLIC PANEL - Abnormal; Notable for the following:    Potassium 3.4 (*)    All other components within normal limits  CK  SEDIMENTATION RATE   Imaging Review No results found.    MDM  Labs wnl.    Linna Hoff, MD 02/26/13 [phone]

## 2013-03-05 ENCOUNTER — Ambulatory Visit: Payer: Medicaid Other

## 2013-03-14 ENCOUNTER — Encounter: Payer: Self-pay | Admitting: Internal Medicine

## 2013-03-14 ENCOUNTER — Ambulatory Visit: Payer: Medicaid Other | Attending: Internal Medicine | Admitting: Internal Medicine

## 2013-03-14 VITALS — BP 131/81 | HR 65 | Temp 98.1°F | Resp 16 | Ht 62.6 in | Wt 188.0 lb

## 2013-03-14 DIAGNOSIS — E876 Hypokalemia: Secondary | ICD-10-CM | POA: Insufficient documentation

## 2013-03-14 DIAGNOSIS — R5381 Other malaise: Secondary | ICD-10-CM | POA: Insufficient documentation

## 2013-03-14 LAB — COMPLETE METABOLIC PANEL WITH GFR
ALT: 15 U/L (ref 0–35)
AST: 17 U/L (ref 0–37)
Albumin: 4.4 g/dL (ref 3.5–5.2)
Alkaline Phosphatase: 52 U/L (ref 39–117)
Chloride: 106 mEq/L (ref 96–112)
Potassium: 3.8 mEq/L (ref 3.5–5.3)
Sodium: 135 mEq/L (ref 135–145)
Total Protein: 6.9 g/dL (ref 6.0–8.3)

## 2013-03-14 LAB — CK: Total CK: 77 U/L (ref 7–177)

## 2013-03-14 NOTE — Progress Notes (Signed)
Patient ID: Phyllis House, female   DOB: [birth year], 37 y.o.   MRN: [id number] Patient Demographics  Phyllis House, is a 37 y.o. female  CSN: [id number]  MRN: [id number]  DOB - [birth year]  Outpatient Primary MD for the patient is Default, Provider, MD   With History of -  Past Medical History  Diagnosis Date  . H/O varicella   . Depression     PP  . Left ureteral calculus   . Migraine   . History of herpes genitalis     LAST BREAK OUT 6 YRS AGO  . History of kidney stones   . Renal calculi     BILATERAL  . Polycystic ovarian syndrome       Past Surgical History  Procedure Laterality Date  . Cystoscopy w/ ureteral stent placement Left 07/01/2012    Procedure: CYSTOSCOPY WITH RETROGRADE PYELOGRAM/URETERAL STENT PLACEMENT;  Surgeon: Milford Cage, MD;  Location: [facility];  Service: Urology;  Laterality: Left;  . Cystoscopy/retrograde/ureteroscopy/stone extraction with basket  AGE 58  . Tonsillectomy  AS CHILD  . Cystoscopy with retrograde pyelogram, ureteroscopy and stent placement Left 07/15/2012    Procedure: CYSTOSCOPY WITH left RETROGRADE PYELOGRAM,left  URETEROSCOPY AND left STENT exchange ;  Surgeon: Milford Cage, MD;  Location: [facility];  Service: Urology;  Laterality: Left;  LEFT URETER STENT EXCHANGE    . Holmium laser application Left 07/15/2012    Procedure: HOLMIUM LASER APPLICATION;  Surgeon: Milford Cage, MD;  Location: [facility];  Service: Urology;  Laterality: Left;    in for   Chief Complaint  Patient presents with  . Establish Care     HPI  Phyllis House  is a 37 y.o. female, history of recurrent hyperkalemia and renal stones for which she is following with the local nephrologist Dr Allena Katz and her local urologist Dr Margarita Grizzle, she presents to the clinic to establish care, she wants to be evaluated for chronic fatigue and muscle aches which she's been having for the last 8-9 months. Denies  any fever chills, denies any joint effusions or joint pains, no oral ulceration, no diarrhea, no family history of lupus or rheumatoid arthritis. She denies any focal weakness. She does except that she snores quite a lot at night when she sleeps and between her 51-year-old son, college and her job she does not get good rest.    Review of Systems    In addition to the HPI above,  No Fever-chills, No Headache, No changes with Vision or hearing, No problems swallowing food or Liquids, No Chest pain, Cough or Shortness of Breath, No Abdominal pain, No Nausea or Vommitting, Bowel movements are regular, No Blood in stool or Urine, No dysuria, No new skin rashes or bruises, No new joints pains-aches,  No new weakness, tingling, numbness in any extremity, but has generalized fatigue and muscle aches No recent weight gain or loss, No polyuria, polydypsia or polyphagia, No significant Mental Stressors.  A full 10 point Review of Systems was done, except as stated above, all other Review of Systems were negative.   Social History History  Substance Use Topics  . Smoking status: Never Smoker   . Smokeless tobacco: Never Used  . Alcohol Use: No      Family History Family History  Problem Relation Age of Onset  . Thyroid disease Mother   . Depression Mother   . Cancer Father     leaukemia  . Thyroid disease Brother   .  Diabetes Maternal Aunt   . Cancer Maternal Grandmother     GI  . Diabetes Maternal Grandfather   . Heart disease Paternal Grandmother       Prior to Admission medications   Medication Sig Start Date End Date Taking? Authorizing Provider  acetaminophen (TYLENOL) 500 MG tablet Take 1,000 mg by mouth every 6 (six) hours as needed for pain.   Yes Historical Provider, MD  ibuprofen (ADVIL,MOTRIN) 200 MG tablet Take 400 mg by mouth every 8 (eight) hours as needed for pain.   Yes Historical Provider, MD  naproxen sodium (ANAPROX) 220 MG tablet Take 220 mg by mouth 2  (two) times daily as needed (for pain).   Yes Historical Provider, MD  potassium citrate (UROCIT-K) 10 MEQ (1080 MG) SR tablet Take 20 mEq by mouth 2 (two) times daily.   Yes Historical Provider, MD  sertraline (ZOLOFT) 50 MG tablet Take 50 mg by mouth every evening.   Yes Historical Provider, MD    Allergies  Allergen Reactions  . Adhesive [Tape] Rash    Physical Exam  Vitals  Blood pressure 131/81, pulse 65, temperature 98.1 F (36.7 C), temperature source Oral, resp. rate 16, height 5' 2.6" (1.59 m), weight 188 lb (85.276 kg), SpO2 100.00%, not currently breastfeeding.   1. General middle-aged obese Caucasian female sitting on clinic examination table in no apparent distress,    2. Normal affect and insight, Not Suicidal or Homicidal, Awake Alert, Oriented X 3.  3. No F.N deficits, ALL C.Nerves Intact, Strength 5/5 all 4 extremities, Sensation intact all 4 extremities, Plantars down going.  4. Ears and Eyes appear Normal, Conjunctivae clear, PERRLA. Moist Oral Mucosa.  5. Supple Neck, No JVD, No cervical lymphadenopathy appriciated, No Carotid Bruits.  6. Symmetrical Chest wall movement, Good air movement bilaterally, CTAB.  7. RRR, No Gallops, Rubs or Murmurs, No Parasternal Heave.  8. Positive Bowel Sounds, Abdomen Soft, Non tender, No organomegaly appriciated,No rebound -guarding or rigidity.  9.  No Cyanosis, Normal Skin Turgor, No Skin Rash or Bruise.  10. Good muscle tone,  joints appear normal , no effusions, Normal ROM.  11. No Palpable Lymph Nodes in Neck or Axillae     Data Review  Lab Results  Component Value Date   WBC 4.8 02/26/2013   HGB 14.3 02/26/2013   HCT 41.1 02/26/2013   MCV 79.3 02/26/2013   PLT 159 02/26/2013      Chemistry      Component Value Date/Time   NA 136 02/26/2013 1458   K 3.4* 02/26/2013 1458   CL 104 02/26/2013 1458   CO2 20 02/26/2013 1458   BUN 9 02/26/2013 1458   CREATININE 0.75 02/26/2013 1458      Component Value  Date/Time   CALCIUM 8.8 02/26/2013 1458   ALKPHOS 53 02/26/2013 1458   AST 21 02/26/2013 1458   ALT 22 02/26/2013 1458   BILITOT 0.3 02/26/2013 1458       No results found for this basename: HGBA1C    No results found for this basename: CHOL, HDL, LDLCALC, LDLDIRECT, TRIG, CHOLHDL    No results found for this basename: TSH        Assessment and plan  Chronic fatigue and malaise with some body aches ongoing for at least 8-9 months. 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 For now will order CMP, Magnesium, Ionized calcium, TSH, A1c, CK, B12, ANA, recent CBC stable as above,  Will get her back in 2 weeks' time. If all tests are unremarkable will order a sleep study.   Persistent hypokalemia and renal stones. She is on potassium supplementation and follows with nephrologist Dr. Allena Katz and local urologist for renal stones. We'll defer these problems to the specialist.      Routine health maintenance.  She is up-to-date on flu shot and Pap smear    Leroy Sea M.D on 03/14/2013 at 2:31 PM    Patient was given clear instructions to go to ER or return to the clinic if symptoms don't improve, worsen or new problems develop. Patient verbalized understanding. Patient was told to call to get lab results if hasn't heard anything in the next week.

## 2013-03-14 NOTE — Addendum Note (Signed)
Addended by: Leroy Sea on: 03/14/2013 02:44 PM   Modules accepted: Orders

## 2013-03-14 NOTE — Progress Notes (Signed)
Pt is here to est care C/o generalized body pain onset 9 months Hx of kidney stones and seeing a urologist and nephrologist Pain is constant and increases w/activity... Unable to p/u child due to pain Ambulated to exam room w/NAD... Alert w/no signs of acute distress.

## 2013-03-15 LAB — VITAMIN D 25 HYDROXY (VIT D DEFICIENCY, FRACTURES)
Vit D, 25-Hydroxy: 32 ng/mL (ref 30–89)
Vit D, 25-Hydroxy: 33 ng/mL (ref 30–89)

## 2013-03-17 LAB — ANA: Anti Nuclear Antibody(ANA): NEGATIVE

## 2013-03-31 ENCOUNTER — Ambulatory Visit: Payer: Medicaid Other | Attending: Internal Medicine | Admitting: Internal Medicine

## 2013-03-31 VITALS — BP 112/76 | HR 77 | Temp 98.9°F | Resp 16 | Ht 63.0 in | Wt 194.0 lb

## 2013-03-31 DIAGNOSIS — F329 Major depressive disorder, single episode, unspecified: Secondary | ICD-10-CM

## 2013-03-31 DIAGNOSIS — R5381 Other malaise: Secondary | ICD-10-CM | POA: Insufficient documentation

## 2013-03-31 MED ORDER — CITALOPRAM HYDROBROMIDE 40 MG PO TABS: 40.0000 mg | ORAL_TABLET | Freq: Every day | ORAL | Status: DC

## 2013-03-31 MED ORDER — SERTRALINE HCL 25 MG PO TABS: 25.0000 mg | ORAL_TABLET | Freq: Every day | ORAL | Status: AC

## 2013-03-31 MED ORDER — VITAMIN D 50 MCG (2000 UT) PO TABS: 2000.0000 [IU] | ORAL_TABLET | Freq: Every day | ORAL | Status: DC

## 2013-03-31 MED ORDER — MAGNESIUM OXIDE 400 MG PO CAPS: ORAL_CAPSULE | ORAL | Status: DC

## 2013-03-31 MED ORDER — SERTRALINE HCL 25 MG PO TABS: 12.5000 mg | ORAL_TABLET | Freq: Every day | ORAL | Status: AC

## 2013-03-31 NOTE — Progress Notes (Signed)
Patient ID: Phyllis House, female   DOB: Oct 08, 1975, 37 y.o.   MRN: [id number]   CC:  HPI:  37 year old female here for followup. Patient complains of bilateral upper extremity and lower extremity cramping. She states that she has had difficulty going up the stairs. She feels that she has difficulty combing her hair. Denies any fever chills, denies any joint effusions or joint pains, no oral ulceration, no diarrhea, no family history of lupus or rheumatoid arthritis. She denies any focal weakness. She does except that she snores quite a lot at night when she sleeps and between her 42-year-old son, college and her job she does not get good rest. She is also emotional and complains of dealing with depression. 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 The Zoloft does not seem to be working She wants the change in medication  Her labs were reviewed with her Her labs are within normal limits with the exception of borderline low vitamin D.   Allergies  Allergen Reactions  . Adhesive [Tape] Rash   Past Medical History  Diagnosis Date  . H/O varicella   . Depression     PP  . Left ureteral calculus   . Migraine   . History of herpes genitalis     LAST BREAK OUT 6 YRS AGO  . History of kidney stones   . Renal calculi     BILATERAL  . Polycystic ovarian syndrome    Current Outpatient Prescriptions on File Prior to Visit  Medication Sig Dispense Refill  . ibuprofen (ADVIL,MOTRIN) 200 MG tablet Take 400 mg by mouth every 8 (eight) hours as needed for pain.      . potassium citrate (UROCIT-K) 10 MEQ (1080 MG) SR tablet Take 20 mEq by mouth 2 (two) times daily.      Marland Kitchen acetaminophen (TYLENOL) 500 MG tablet Take 1,000 mg by mouth every 6 (six) hours as needed for pain.      . naproxen sodium (ANAPROX) 220 MG tablet Take 220 mg by mouth 2 (two) times daily as needed (for pain).       No current  facility-administered medications on file prior to visit.   Family History  Problem Relation Age of Onset  . Thyroid disease Mother   . Depression Mother   . Cancer Father     leaukemia  . Thyroid disease Brother   . Diabetes Maternal Aunt   . Cancer Maternal Grandmother     GI  . Diabetes Maternal Grandfather   . Heart disease Paternal Grandmother    History   Social History  . Marital Status: Divorced    Spouse Name: N/A    Number of Children: N/A  . Years of Education: N/A   Occupational History  . Not on file.   Social History Main Topics  . Smoking status: Never Smoker   . Smokeless tobacco: Never Used  . Alcohol Use: No  . Drug Use: No  . Sexual Activity: Not on file   Other Topics Concern  . Not on file   Social History Narrative  . No narrative on file    Review of Systems  Constitutional: Negative for fever, chills, diaphoresis, activity change, appetite change and fatigue.  HENT: Negative for ear pain, nosebleeds, congestion, facial swelling, rhinorrhea, neck pain, neck stiffness and ear discharge.   Eyes: Negative for pain, discharge, redness, itching and visual disturbance.  Respiratory: Negative for cough, choking, chest tightness,  shortness of breath, wheezing and stridor.   Cardiovascular: Negative for chest pain, palpitations and leg swelling.  Gastrointestinal: Negative for abdominal distention.  Genitourinary: Negative for dysuria, urgency, frequency, hematuria, flank pain, decreased urine volume, difficulty urinating and dyspareunia.  Musculoskeletal: Negative for back pain, joint swelling, arthralgias and gait problem.  Neurological: Negative for dizziness, tremors, seizures, syncope, facial asymmetry, speech difficulty, weakness, light-headedness, numbness and headaches.  Hematological: Negative for adenopathy. Does not bruise/bleed easily.  Psychiatric/Behavioral: Negative for hallucinations, behavioral problems, confusion, dysphoric mood,  decreased concentration and agitation.    Objective:   Filed Vitals:   03/31/13 1621  BP: 112/76  Pulse: 77  Temp: 98.9 F (37.2 C)  Resp: 16    Physical Exam  Constitutional: Appears well-developed and well-nourished. No distress.  HENT: Normocephalic. External right and left ear normal. Oropharynx is clear and moist.  Eyes: Conjunctivae and EOM are normal. PERRLA, no scleral icterus.  Neck: Normal ROM. Neck supple. No JVD. No tracheal deviation. No thyromegaly.  CVS: RRR, S1/S2 +, no murmurs, no gallops, no carotid bruit.  Pulmonary: Effort and breath sounds normal, no stridor, rhonchi, wheezes, rales.  Abdominal: Soft. BS +,  no distension, tenderness, rebound or guarding.  Musculoskeletal: Normal range of motion. No edema and no tenderness.  Lymphadenopathy: No lymphadenopathy noted, cervical, inguinal. Neuro: Alert. Normal reflexes, muscle tone coordination. No cranial nerve deficit. Skin: Skin is warm and dry. No rash noted. Not diaphoretic. No erythema. No pallor.  Psychiatric: Normal mood and affect. Behavior, judgment, thought content normal.   Lab Results  Component Value Date   WBC 4.8 02/26/2013   HGB 14.3 02/26/2013   HCT 41.1 02/26/2013   MCV 79.3 02/26/2013   PLT 159 02/26/2013   Lab Results  Component Value Date   CREATININE 1.04 03/14/2013   BUN 13 03/14/2013   NA 135 03/14/2013   K 3.8 03/14/2013   CL 106 03/14/2013   CO2 21 03/14/2013    No results found for this basename: HGBA1C   Lipid Panel  No results found for this basename: chol, trig, hdl, cholhdl, vldl, ldlcalc       Assessment and plan:   Patient Active Problem List   Diagnosis Date Noted  . Other malaise and fatigue 03/14/2013  . Hypokalemia 12/26/2012  . Infection of urinary tract 07/02/2012  . Ureteral calculus, left 07/01/2012  . Fever, unspecified 07/01/2012       Chronic fatigue and malaise Borderline low magnesium and vitamin D. Will replace both Rheumatology  referral Patient not interested in a sleep study today Given signs and symptoms of depression will also refer to psychiatry Recommended patient to taper of Zoloft, and start Celexa after 2 weeks    The patient was given clear instructions to go to ER or return to medical center if symptoms don't improve, worsen or new problems develop. The patient verbalized understanding. The patient was told to call to get any lab results if not heard anything in the next week.

## 2013-03-31 NOTE — Progress Notes (Signed)
Pt here to f/u lab results 03/14/13 C/o generalized achy pain all over. Taking ibuprofen as needed

## 2013-06-02 ENCOUNTER — Ambulatory Visit: Payer: Medicaid Other | Admitting: Internal Medicine

## 2013-07-02 ENCOUNTER — Ambulatory Visit: Payer: Medicaid Other | Attending: Internal Medicine | Admitting: *Deleted

## 2013-07-02 DIAGNOSIS — F329 Major depressive disorder, single episode, unspecified: Secondary | ICD-10-CM

## 2013-07-02 DIAGNOSIS — F32A Depression, unspecified: Secondary | ICD-10-CM

## 2013-07-02 NOTE — Progress Notes (Signed)
Patient identified that she has had challenges with depression for years and had been on medication for a while as well, but lately has ben having a difficult time being productive with school and work and life.  Patient was prescribed a new antidepressant that has been helping since December but she continues to experience some challenging symptoms. LCS[facility] They will likely require psychotherapy as well.  LCSW encouraged patient to follow up with this LCSW for further support as needed.  Beverly Sessionsywan J Khalif Stender MSW, LCSW Duration - 30 minutes

## 2013-07-07 ENCOUNTER — Encounter: Payer: Self-pay | Admitting: Internal Medicine

## 2013-07-07 ENCOUNTER — Ambulatory Visit: Payer: Medicaid Other | Attending: Internal Medicine | Admitting: Internal Medicine

## 2013-07-07 VITALS — BP 137/85 | HR 88 | Temp 98.6°F | Resp 16 | Wt 199.0 lb

## 2013-07-07 DIAGNOSIS — F32A Depression, unspecified: Secondary | ICD-10-CM

## 2013-07-07 DIAGNOSIS — G4710 Hypersomnia, unspecified: Secondary | ICD-10-CM

## 2013-07-07 DIAGNOSIS — F329 Major depressive disorder, single episode, unspecified: Secondary | ICD-10-CM

## 2013-07-07 DIAGNOSIS — F3289 Other specified depressive episodes: Secondary | ICD-10-CM

## 2013-07-07 DIAGNOSIS — G479 Sleep disorder, unspecified: Secondary | ICD-10-CM

## 2013-07-07 DIAGNOSIS — R400 Somnolence: Secondary | ICD-10-CM

## 2013-07-07 DIAGNOSIS — F411 Generalized anxiety disorder: Secondary | ICD-10-CM | POA: Insufficient documentation

## 2013-07-07 NOTE — Progress Notes (Signed)
MRN: [id number] Name: Phyllis House  Sex: female Age: 38 y.o. DOB: 04/12/1976  Allergies: Adhesive  Chief Complaint  Patient presents with  . Follow-up    HPI: Patient is 38 y.o. female who has is she of anxiety/depression, patient has been taking Celexa 40 mg daily, she has already been referred to see a psychiatrist, she reported to have lot of daytime sleepiness as well as she snores at night, she is anxious and tearful, she gets overwhelmed by all these  symptoms, she is seeing our Child psychotherapistsocial worker, she denies any SI or HI.  Past Medical History  Diagnosis Date  . H/O varicella   . Depression     PP  . Left ureteral calculus   . Migraine   . History of herpes genitalis     LAST BREAK OUT 6 YRS AGO  . History of kidney stones   . Renal calculi     BILATERAL  . Polycystic ovarian syndrome     Past Surgical History  Procedure Laterality Date  . Cystoscopy w/ ureteral stent placement Left 07/01/2012    Procedure: CYSTOSCOPY WITH RETROGRADE PYELOGRAM/URETERAL STENT PLACEMENT;  Surgeon: Milford Cageaniel Young Woodruff, MD;  Location: [facility];  Service: Urology;  Laterality: Left;  . Cystoscopy/retrograde/ureteroscopy/stone extraction with basket  AGE 85  . Tonsillectomy  AS CHILD  . Cystoscopy with retrograde pyelogram, ureteroscopy and stent placement Left 07/15/2012    Procedure: CYSTOSCOPY WITH left RETROGRADE PYELOGRAM,left  URETEROSCOPY AND left STENT exchange ;  Surgeon: Milford Cageaniel Young Woodruff, MD;  [facility] [place];  Service: Urology;  Laterality: Left;  LEFT URETER STENT EXCHANGE    . Holmium laser application Left 07/15/2012    Procedure: HOLMIUM LASER APPLICATION;  Surgeon: Milford Cageaniel Young Woodruff, MD;  [facility] [place];  Service: Urology;  Laterality: Left;      Medication List       This list is accurate as of: 07/07/13 10:25 AM.  Always use your most recent med list.               acetaminophen 500 MG tablet  Commonly known  as:  TYLENOL  Take 1,000 mg by mouth every 6 (six) hours as needed for pain.     citalopram 40 MG tablet  Commonly known as:  CELEXA  Take 1 tablet (40 mg total) by mouth daily.     ibuprofen 200 MG tablet  Commonly known as:  ADVIL,MOTRIN  Take 400 mg by mouth every 8 (eight) hours as needed for pain.     Magnesium Oxide 400 MG Caps  1 tablet daily,     naproxen sodium 220 MG tablet  Commonly known as:  ANAPROX  Take 220 mg by mouth 2 (two) times daily as needed (for pain).     potassium citrate 10 MEQ (1080 MG) SR tablet  Commonly known as:  UROCIT-K  Take 20 mEq by mouth 2 (two) times daily.     Vitamin D 2000 UNITS tablet  Take 1 tablet (2,000 Units total) by mouth daily.        No orders of the defined types were placed in this encounter.    Immunization History  Administered Date(s) Administered  . Influenza Split 02/14/2012  . Tdap 02/13/2012    Family History  Problem Relation Age of Onset  . Thyroid disease Mother   . Depression Mother   . Cancer Father     leaukemia  . Thyroid disease Brother   . Diabetes Maternal  Aunt   . Cancer Maternal Grandmother     GI  . Diabetes Maternal Grandfather   . Heart disease Paternal Grandmother     History  Substance Use Topics  . Smoking status: Never Smoker   . Smokeless tobacco: Never Used  . Alcohol Use: No    Review of Systems   As noted in HPI  Filed Vitals:   07/07/13 1003  BP: 137/85  Pulse: 88  Temp: 98.6 F (37 C)  Resp: 16    Physical Exam  Physical Exam  Constitutional: No distress.  Eyes: EOM are normal. Pupils are equal, round, and reactive to light.  Cardiovascular: Normal rate and regular rhythm.   Pulmonary/Chest: Breath sounds normal. No respiratory distress. She has no wheezes. She has no rales.  Psychiatric:  Anxious tearfull    CBC    Component Value Date/Time   WBC 4.8 02/26/2013 1458   RBC 5.18* 02/26/2013 1458   HGB 14.3 02/26/2013 1458   HCT 41.1 02/26/2013  1458   PLT 159 02/26/2013 1458   MCV 79.3 02/26/2013 1458   LYMPHSABS 1.7 02/26/2013 1458   MONOABS 0.4 02/26/2013 1458   EOSABS 0.1 02/26/2013 1458   BASOSABS 0.1 02/26/2013 1458    CMP     Component Value Date/Time   NA 135 03/14/2013 1450   K 3.8 03/14/2013 1450   CL 106 03/14/2013 1450   CO2 21 03/14/2013 1450   GLUCOSE 81 03/14/2013 1450   BUN 13 03/14/2013 1450   CREATININE 1.04 03/14/2013 1450   CREATININE 0.75 02/26/2013 1458   CALCIUM 8.9 03/14/2013 1450   PROT 6.9 03/14/2013 1450   ALBUMIN 4.4 03/14/2013 1450   AST 17 03/14/2013 1450   ALT 15 03/14/2013 1450   ALKPHOS 52 03/14/2013 1450   BILITOT 0.4 03/14/2013 1450   GFRNONAA >90 02/26/2013 1458   GFRAA >90 02/26/2013 1458    No results found for this basename: chol, tri, ldl    No components found with this basename: hga1c    Lab Results  Component Value Date/Time   AST 17 03/14/2013  2:50 PM    Assessment and Plan  Depression/anxiety Patient is on Celexa 40 mg daily, she is going to follow with psychiatrist and is already seeing our Child psychotherapist.  Sleep disorder/Daytime somnolence  - Plan: Split night study  I have ordered sleep study.   Return in about 3 months (around 10/07/2013) for anxiety, depression.  Doris Cheadle, MD

## 2013-07-07 NOTE — Progress Notes (Signed)
Patient here for follow up on her depression Anxiety issues muscle aches and headache

## 2013-08-11 ENCOUNTER — Ambulatory Visit (HOSPITAL_BASED_OUTPATIENT_CLINIC_OR_DEPARTMENT_OTHER): Payer: Medicaid Other | Attending: Internal Medicine

## 2013-08-11 DIAGNOSIS — G479 Sleep disorder, unspecified: Secondary | ICD-10-CM

## 2013-08-11 DIAGNOSIS — G4730 Sleep apnea, unspecified: Principal | ICD-10-CM

## 2013-08-11 DIAGNOSIS — G4710 Hypersomnia, unspecified: Secondary | ICD-10-CM | POA: Insufficient documentation

## 2013-08-11 DIAGNOSIS — R400 Somnolence: Secondary | ICD-10-CM

## 2013-08-16 DIAGNOSIS — G4710 Hypersomnia, unspecified: Secondary | ICD-10-CM

## 2013-08-16 DIAGNOSIS — G4730 Sleep apnea, unspecified: Secondary | ICD-10-CM

## 2013-08-16 NOTE — Sleep Study (Signed)
   NAME: Phyllis HarrisJennifer Sevin DATE OF BIRTH:  04/15/1976 MEDICAL RECORD NUMBER [id number]  LOCATION: [facility]  PHYSICIAN: Mickal Meno D Edem Tiegs  DATE OF STUDY: 08/11/2013  SLEEP STUDY TYPE: Nocturnal Polysomnogram               REFERRING PHYSICIAN: Doris CheadleAdvani, Deepak, MD  INDICATION FOR STUDY: Hypersomnia with sleep apnea  EPWORTH SLEEPINESS SCORE:   3/24 HEIGHT:   5 feet 3 inches WEIGHT:    190 pounds   BMI 34 NECK SIZE: 14.5 in.  MEDICATIONS: Charted for review  SLEEP ARCHITECTURE: Total sleep time 239.5 minutes with sleep efficiency 65.7%. Stage I was 10.9%, stage II 89.1%, stage III absent, REM absent. Sleep latency 58 minutes, awake after sleep onset 66 minutes, arousal index 19, bedtime medication: Citalopram. Sleep was fragmented by frequent spontaneous awakenings.  RESPIRATORY DATA: Apnea hypopnea index (AHI) 0 per hour. There were no events needing scoring criteria.  OXYGEN DATA: Moderate snoring with oxygen desaturation to a nadir of 93% and mean oxygen saturation through the study of 95.1% on room air.  CARDIAC DATA: Normal sinus rhythm  MOVEMENT/PARASOMNIA: 41 limb jerks were counted of which 7 were associated with arousal or awakening for a periodic limb movement with arousal index of 1.8 per hour. No bathroom trips  IMPRESSION/ RECOMMENDATION:  1) Sleep pattern was significant for frequent spontaneous awakenings and absence of "deeper" stage III and REM. She may benefit from management for insomnia. 2) There were no respiratory events meeting scoring criteria, within normal limits, AHI 0 per hour. Moderate snoring with oxygen desaturation to a nadir of 93% and mean oxygen saturation through the study of 95.1% on room air. 3) Minimal Periodic Limb Movement in Sleep Syndrome. 41 limb jerks were counted of which 7 were associated with arousal or awakening for a periodic limb movement with arousal index of 1.8 per hour. If this pattern is important while sleeping  at home, then a therapeutic trial of a specific medication such as Requip or Mirapex might be considered.   Signed Jetty Duhamellinton Graeson Nouri M.D. Waymon Budgelinton D Svetlana Bagby Diplomate, Biomedical engineerAmerican Board of Sleep Medicine  ELECTRONICALLY SIGNED ON:  08/16/2013, 3:17 PM [facility] PH: [phone]   FX: [phone] ACCREDITED BY THE AMERICAN ACADEMY OF SLEEP MEDICINE

## 2013-08-29 ENCOUNTER — Telehealth: Payer: Self-pay | Admitting: Internal Medicine

## 2013-08-29 NOTE — Telephone Encounter (Signed)
Pt is calling in today to request the results from her sleep study; please f/u with pt

## 2013-09-01 ENCOUNTER — Telehealth: Payer: Self-pay

## 2013-09-01 NOTE — Telephone Encounter (Signed)
Patient is calling for results of her sleep  Study

## 2013-09-12 ENCOUNTER — Telehealth: Payer: Self-pay | Admitting: *Deleted

## 2013-09-12 NOTE — Telephone Encounter (Signed)
Patient calling wanting results of sleep study. Please follow up with patient. Phyllis LaurenceJamie R Diamante Truszkowski, RN

## 2013-09-13 ENCOUNTER — Other Ambulatory Visit: Payer: Self-pay | Admitting: Internal Medicine

## 2013-09-13 DIAGNOSIS — F329 Major depressive disorder, single episode, unspecified: Secondary | ICD-10-CM

## 2013-09-13 DIAGNOSIS — F32A Depression, unspecified: Secondary | ICD-10-CM

## 2013-09-15 ENCOUNTER — Telehealth: Payer: Self-pay | Admitting: *Deleted

## 2013-09-15 NOTE — Telephone Encounter (Signed)
Patient calling in to get results of sleep study. Reather LaurenceJamie R Kagan Hietpas, RN

## 2013-09-15 NOTE — Telephone Encounter (Signed)
Called patient in regards to sleep study results. Left a message for patient to return call. Reather LaurenceJamie R Faithanne Verret, RN

## 2013-09-15 NOTE — Telephone Encounter (Signed)
Patient returned call. Patient given results of sleep study. Patient states she would like to talk to Dr. Orpah CobbAdvani about medication management of her insomnia. Patient has a follow up appointment  October 07, 2013. Patient states she does not want to wait that long. Patient transferred to scheduling. Reather LaurenceJamie R Marijane Trower, RN

## 2013-10-07 ENCOUNTER — Ambulatory Visit: Payer: Medicaid Other | Admitting: Internal Medicine

## 2013-11-19 ENCOUNTER — Encounter: Payer: Self-pay | Admitting: Internal Medicine

## 2013-11-19 ENCOUNTER — Ambulatory Visit: Payer: Medicaid Other | Attending: Internal Medicine | Admitting: Internal Medicine

## 2013-11-19 VITALS — BP 136/80 | HR 79 | Temp 98.9°F | Resp 16 | Wt 205.0 lb

## 2013-11-19 DIAGNOSIS — F329 Major depressive disorder, single episode, unspecified: Secondary | ICD-10-CM | POA: Insufficient documentation

## 2013-11-19 DIAGNOSIS — G43009 Migraine without aura, not intractable, without status migrainosus: Secondary | ICD-10-CM | POA: Diagnosis not present

## 2013-11-19 DIAGNOSIS — F3289 Other specified depressive episodes: Secondary | ICD-10-CM | POA: Diagnosis not present

## 2013-11-19 DIAGNOSIS — F32A Depression, unspecified: Secondary | ICD-10-CM

## 2013-11-19 DIAGNOSIS — R51 Headache: Secondary | ICD-10-CM | POA: Diagnosis present

## 2013-11-19 MED ORDER — TOPIRAMATE 50 MG PO TABS: 50.0000 mg | ORAL_TABLET | Freq: Every day | ORAL | Status: DC

## 2013-11-19 NOTE — Progress Notes (Signed)
MRN: [id number] Name: Phyllis HarrisJennifer House  Sex: female Age: 38 y.o. DOB: 10/22/1975  Allergies: Adhesive  Chief Complaint  Patient presents with  . Headache    HPI: Patient is 38 y.o. female who history of headaches for several years  as per patient she tried several medications in the past and used to follow with her neurologist her she used to take Topamax but few years ago patient wanted to become pregnant and the medication was discontinued, recently she has been having similar symptoms and wants to resume back on this medication currently she's not breast-feeding or pregnant, patient also history of mood disorder/depression and is following up with the psychiatrist and is on latuda, she denies any SI or HI.   Past Medical History  Diagnosis Date  . H/O varicella   . Depression     PP  . Left ureteral calculus   . Migraine   . History of herpes genitalis     LAST BREAK OUT 6 YRS AGO  . History of kidney stones   . Renal calculi     BILATERAL  . Polycystic ovarian syndrome     Past Surgical History  Procedure Laterality Date  . Cystoscopy w/ ureteral stent placement Left 07/01/2012    Procedure: CYSTOSCOPY WITH RETROGRADE PYELOGRAM/URETERAL STENT PLACEMENT;  Surgeon: Milford Cageaniel Young Woodruff, MD;  Location: [facility];  Service: Urology;  Laterality: Left;  . Cystoscopy/retrograde/ureteroscopy/stone extraction with basket  AGE 38  . Tonsillectomy  AS CHILD  . Cystoscopy with retrograde pyelogram, ureteroscopy and stent placement Left 07/15/2012    Procedure: CYSTOSCOPY WITH left RETROGRADE PYELOGRAM,left  URETEROSCOPY AND left STENT exchange ;  Surgeon: Milford Cageaniel Young Woodruff, MD;  [facility] [place];  Service: Urology;  Laterality: Left;  LEFT URETER STENT EXCHANGE    . Holmium laser application Left 07/15/2012    Procedure: HOLMIUM LASER APPLICATION;  Surgeon: Milford Cageaniel Young Woodruff, MD;  [facility] [place];  Service: Urology;  Laterality: Left;       Medication List       This list is accurate as of: 11/19/13  2:36 PM.  Always use your most recent med list.               acetaminophen 500 MG tablet  Commonly known as:  TYLENOL  Take 1,000 mg by mouth every 6 (six) hours as needed for pain.     citalopram 40 MG tablet  Commonly known as:  CELEXA  TAKE 1 TABLET (40 MG TOTAL) BY MOUTH DAILY.     ibuprofen 200 MG tablet  Commonly known as:  ADVIL,MOTRIN  Take 400 mg by mouth every 8 (eight) hours as needed for pain.     Magnesium Oxide 400 MG Caps  1 tablet daily,     naproxen sodium 220 MG tablet  Commonly known as:  ANAPROX  Take 220 mg by mouth 2 (two) times daily as needed (for pain).     potassium citrate 10 MEQ (1080 MG) SR tablet  Commonly known as:  UROCIT-K  Take 20 mEq by mouth 2 (two) times daily.     topiramate 50 MG tablet  Commonly known as:  TOPAMAX  Take 1 tablet (50 mg total) by mouth at bedtime.     Vitamin D 2000 UNITS tablet  Take 1 tablet (2,000 Units total) by mouth daily.        Meds ordered this encounter  Medications  . topiramate (TOPAMAX) 50 MG tablet    Sig:  Take 1 tablet (50 mg total) by mouth at bedtime.    Dispense:  30 tablet    Refill:  3    Immunization History  Administered Date(s) Administered  . Influenza Split 02/14/2012  . Tdap 02/13/2012    Family History  Problem Relation Age of Onset  . Thyroid disease Mother   . Depression Mother   . Cancer Father     leaukemia  . Thyroid disease Brother   . Diabetes Maternal Aunt   . Cancer Maternal Grandmother     GI  . Diabetes Maternal Grandfather   . Heart disease Paternal Grandmother     History  Substance Use Topics  . Smoking status: Never Smoker   . Smokeless tobacco: Never Used  . Alcohol Use: No    Review of Systems   As noted in HPI  Filed Vitals:   11/19/13 1416  BP: 136/80  Pulse: 79  Temp: 98.9 F (37.2 C)  Resp: 16    Physical Exam  Physical Exam  Constitutional: She is  oriented to person, place, and time. No distress.  Eyes: EOM are normal. Pupils are equal, round, and reactive to light.  Cardiovascular: Normal rate and regular rhythm.   Pulmonary/Chest: Breath sounds normal. No respiratory distress. She has no wheezes. She has no rales.  Neurological: She is oriented to person, place, and time. She has normal reflexes. No cranial nerve deficit. Coordination normal.  Psychiatric: Affect normal.    CBC    Component Value Date/Time   WBC 4.8 02/26/2013 1458   RBC 5.18* 02/26/2013 1458   HGB 14.3 02/26/2013 1458   HCT 41.1 02/26/2013 1458   PLT 159 02/26/2013 1458   MCV 79.3 02/26/2013 1458   LYMPHSABS 1.7 02/26/2013 1458   MONOABS 0.4 02/26/2013 1458   EOSABS 0.1 02/26/2013 1458   BASOSABS 0.1 02/26/2013 1458    CMP     Component Value Date/Time   NA 135 03/14/2013 1450   K 3.8 03/14/2013 1450   CL 106 03/14/2013 1450   CO2 21 03/14/2013 1450   GLUCOSE 81 03/14/2013 1450   BUN 13 03/14/2013 1450   CREATININE 1.04 03/14/2013 1450   CREATININE 0.75 02/26/2013 1458   CALCIUM 8.9 03/14/2013 1450   PROT 6.9 03/14/2013 1450   ALBUMIN 4.4 03/14/2013 1450   AST 17 03/14/2013 1450   ALT 15 03/14/2013 1450   ALKPHOS 52 03/14/2013 1450   BILITOT 0.4 03/14/2013 1450   GFRNONAA 68 03/14/2013 1450   GFRNONAA >90 02/26/2013 1458   GFRAA 79 03/14/2013 1450   GFRAA >90 02/26/2013 1458    No results found for this basename: chol,  tri,  ldl    No components found with this basename: hga1c    Lab Results  Component Value Date/Time   AST 17 03/14/2013  2:50 PM    Assessment and Plan  Migraine without aura and without status migrainosus, not intractable - Plan: Resume back on topiramate (TOPAMAX) 50 MG tablet each bedtime  Depression  patient following up with her psychiatrist.    Return in about 3 months (around 02/19/2014) for migraine.  Doris Cheadle, MD

## 2013-11-19 NOTE — Progress Notes (Signed)
Patient complains of having headaches and some Increased anxiety. She states she does see a therapist But not taking any medication for it

## 2014-03-02 ENCOUNTER — Encounter: Payer: Self-pay | Admitting: Internal Medicine

## 2014-04-02 ENCOUNTER — Emergency Department (HOSPITAL_COMMUNITY): Payer: BC Managed Care – HMO

## 2014-04-02 ENCOUNTER — Encounter (HOSPITAL_COMMUNITY): Payer: Self-pay | Admitting: Emergency Medicine

## 2014-04-02 ENCOUNTER — Emergency Department (HOSPITAL_COMMUNITY)
Admission: EM | Admit: 2014-04-02 | Discharge: 2014-04-02 | Disposition: A | Discharge status: Home / Self Care | Payer: BC Managed Care – HMO | Attending: Emergency Medicine | Admitting: Emergency Medicine

## 2014-04-02 DIAGNOSIS — Z3202 Encounter for pregnancy test, result negative: Secondary | ICD-10-CM | POA: Diagnosis not present

## 2014-04-02 DIAGNOSIS — N390 Urinary tract infection, site not specified: Secondary | ICD-10-CM | POA: Insufficient documentation

## 2014-04-02 DIAGNOSIS — Z792 Long term (current) use of antibiotics: Secondary | ICD-10-CM | POA: Diagnosis not present

## 2014-04-02 DIAGNOSIS — Z8639 Personal history of other endocrine, nutritional and metabolic disease: Secondary | ICD-10-CM | POA: Diagnosis not present

## 2014-04-02 DIAGNOSIS — F329 Major depressive disorder, single episode, unspecified: Secondary | ICD-10-CM | POA: Insufficient documentation

## 2014-04-02 DIAGNOSIS — G43909 Migraine, unspecified, not intractable, without status migrainosus: Secondary | ICD-10-CM | POA: Diagnosis not present

## 2014-04-02 DIAGNOSIS — N201 Calculus of ureter: Secondary | ICD-10-CM | POA: Diagnosis not present

## 2014-04-02 DIAGNOSIS — Z79899 Other long term (current) drug therapy: Secondary | ICD-10-CM | POA: Insufficient documentation

## 2014-04-02 DIAGNOSIS — R1032 Left lower quadrant pain: Secondary | ICD-10-CM | POA: Diagnosis present

## 2014-04-02 DIAGNOSIS — D72829 Elevated white blood cell count, unspecified: Secondary | ICD-10-CM | POA: Diagnosis not present

## 2014-04-02 DIAGNOSIS — Z8619 Personal history of other infectious and parasitic diseases: Secondary | ICD-10-CM | POA: Insufficient documentation

## 2014-04-02 LAB — COMPREHENSIVE METABOLIC PANEL
ALBUMIN: 4.2 g/dL (ref 3.5–5.2)
ALK PHOS: 58 U/L (ref 39–117)
ALT: 23 U/L (ref 0–35)
ANION GAP: 15 (ref 5–15)
AST: 17 U/L (ref 0–37)
BILIRUBIN TOTAL: 0.3 mg/dL (ref 0.3–1.2)
BUN: 12 mg/dL (ref 6–23)
CHLORIDE: 106 meq/L (ref 96–112)
CO2: 17 mEq/L — ABNORMAL LOW (ref 19–32)
CREATININE: 1.05 mg/dL (ref 0.50–1.10)
Calcium: 9.4 mg/dL (ref 8.4–10.5)
GFR calc non Af Amer: 66 mL/min — ABNORMAL LOW (ref >90)
GFR, EST AFRICAN AMERICAN: 76 mL/min — AB (ref >90)
GLUCOSE: 103 mg/dL — AB (ref 70–99)
POTASSIUM: 3.6 meq/L — AB (ref 3.7–5.3)
Sodium: 138 mEq/L (ref 137–147)
Total Protein: 7.2 g/dL (ref 6.0–8.3)

## 2014-04-02 LAB — URINALYSIS, ROUTINE W REFLEX MICROSCOPIC
BILIRUBIN URINE: NEGATIVE
Glucose, UA: NEGATIVE mg/dL
KETONES UR: NEGATIVE mg/dL
NITRITE: POSITIVE — AB
PH: 6.5 (ref 5.0–8.0)
PROTEIN: NEGATIVE mg/dL
Specific Gravity, Urine: 1.019 (ref 1.005–1.030)
UROBILINOGEN UA: 0.2 mg/dL (ref 0.0–1.0)

## 2014-04-02 LAB — CBC WITH DIFFERENTIAL/PLATELET
BASOS PCT: 1 % (ref 0–1)
Basophils Absolute: 0.1 10*3/uL (ref 0.0–0.1)
EOS ABS: 0.1 10*3/uL (ref 0.0–0.7)
Eosinophils Relative: 3 % (ref 0–5)
HEMATOCRIT: 39.5 % (ref 36.0–46.0)
HEMOGLOBIN: 13 g/dL (ref 12.0–15.0)
LYMPHS ABS: 1.6 10*3/uL (ref 0.7–4.0)
Lymphocytes Relative: 40 % (ref 12–46)
MCH: 25.8 pg — AB (ref 26.0–34.0)
MCHC: 32.9 g/dL (ref 30.0–36.0)
MCV: 78.4 fL (ref 78.0–100.0)
MONO ABS: 0.4 10*3/uL (ref 0.1–1.0)
MONOS PCT: 9 % (ref 3–12)
Neutro Abs: 1.9 10*3/uL (ref 1.7–7.7)
Neutrophils Relative %: 47 % (ref 43–77)
Platelets: 157 10*3/uL (ref 150–400)
RBC: 5.04 MIL/uL (ref 3.87–5.11)
RDW: 13.9 % (ref 11.5–15.5)
WBC: 4 10*3/uL (ref 4.0–10.5)

## 2014-04-02 LAB — URINE MICROSCOPIC-ADD ON

## 2014-04-02 LAB — POC URINE PREG, ED: PREG TEST UR: NEGATIVE

## 2014-04-02 MED ORDER — OXYCODONE-ACETAMINOPHEN 5-325 MG PO TABS: 1.0000 | ORAL_TABLET | Freq: Four times a day (QID) | ORAL | Status: DC | PRN

## 2014-04-02 MED ORDER — CIPROFLOXACIN HCL 500 MG PO TABS: 500.0000 mg | ORAL_TABLET | Freq: Two times a day (BID) | ORAL | Status: DC

## 2014-04-02 MED ORDER — ONDANSETRON HCL 4 MG PO TABS: 4.0000 mg | ORAL_TABLET | Freq: Four times a day (QID) | ORAL | Status: DC

## 2014-04-02 MED ORDER — ONDANSETRON HCL 4 MG/2ML IJ SOLN
4.0000 mg | Freq: Once | INTRAMUSCULAR | Status: AC
  Administered 2014-04-02: 4 mg via INTRAVENOUS
  Filled 2014-04-02: qty 2

## 2014-04-02 MED ORDER — SODIUM CHLORIDE 0.9 % IV BOLUS (SEPSIS)
1000.0000 mL | Freq: Once | INTRAVENOUS | Status: AC
  Administered 2014-04-02: 1000 mL via INTRAVENOUS

## 2014-04-02 MED ORDER — ONDANSETRON 8 MG PO TBDP: 8.0000 mg | ORAL_TABLET | Freq: Once | ORAL | Status: DC

## 2014-04-02 MED ORDER — HYDROMORPHONE HCL 1 MG/ML IJ SOLN
1.0000 mg | Freq: Once | INTRAMUSCULAR | Status: AC
  Administered 2014-04-02: 1 mg via INTRAVENOUS
  Filled 2014-04-02: qty 1

## 2014-04-02 MED ORDER — DEXTROSE 5 % IV SOLN
1.0000 g | Freq: Once | INTRAVENOUS | Status: AC
  Administered 2014-04-02: 1 g via INTRAVENOUS
  Filled 2014-04-02: qty 10

## 2014-04-02 NOTE — ED Notes (Addendum)
Pt c/o LLQ pain, states she has had kidney stones in the past and this feels like one but she cannot pass it. Pt also c/o nausea. States she has passed 3 stones since Sept, last one on Monday.

## 2014-04-02 NOTE — ED Provider Notes (Signed)
CSN: [id number]     Arrival date & time 04/02/14  0802 History   First MD Initiated Contact with Patient 04/02/14 0813     Chief Complaint  Patient presents with  . Abdominal Pain    LLQ     (Consider location/radiation/quality/duration/timing/severity/associated sxs/prior Treatment) Patient is a 38 y.o. female presenting with abdominal pain. The history is provided by the patient. No language interpreter was used.  Abdominal Pain Pain location:  LLQ Pain quality: sharp   Pain radiates to:  Does not radiate Pain severity:  Severe Onset quality:  Sudden Duration:  4 hours Timing:  Constant Progression:  Unchanged Chronicity:  Recurrent Relieved by:  Nothing Worsened by:  Nothing tried Ineffective treatments:  None tried Associated symptoms: nausea   Associated symptoms: no chest pain, no chills, no constipation, no cough, no diarrhea, no dysuria, no fatigue, no fever, no flatus, no shortness of breath and no sore throat  Vomiting: dry heaving.   Risk factors comment:  Recurrent kidney stones   Past Medical History  Diagnosis Date  . H/O varicella   . Depression     PP  . Left ureteral calculus   . Migraine   . History of herpes genitalis     LAST BREAK OUT 6 YRS AGO  . History of kidney stones   . Renal calculi     BILATERAL  . Polycystic ovarian syndrome    Past Surgical History  Procedure Laterality Date  . Cystoscopy w/ ureteral stent placement Left 07/01/2012    Procedure: CYSTOSCOPY WITH RETROGRADE PYELOGRAM/URETERAL STENT PLACEMENT;  Surgeon: Milford Cageaniel Young Woodruff, MD;  Location: [facility];  Service: Urology;  Laterality: Left;  . Cystoscopy/retrograde/ureteroscopy/stone extraction with basket  AGE 68  . Tonsillectomy  AS CHILD  . Cystoscopy with retrograde pyelogram, ureteroscopy and stent placement Left 07/15/2012    Procedure: CYSTOSCOPY WITH left RETROGRADE PYELOGRAM,left  URETEROSCOPY AND left STENT exchange ;  Surgeon: Milford Cageaniel Young Woodruff, MD;  Location:  [facility];  Service: Urology;  Laterality: Left;  LEFT URETER STENT EXCHANGE    . Holmium laser application Left 07/15/2012    Procedure: HOLMIUM LASER APPLICATION;  Surgeon: Milford Cageaniel Young Woodruff, MD;  [facility] ;  Service: Urology;  Laterality: Left;   Family History  Problem Relation Age of Onset  . Thyroid disease Mother   . Depression Mother   . Cancer Father     leaukemia  . Thyroid disease Brother   . Diabetes Maternal Aunt   . Cancer Maternal Grandmother     GI  . Diabetes Maternal Grandfather   . Heart disease Paternal Grandmother    History  Substance Use Topics  . Smoking status: Never Smoker   . Smokeless tobacco: Never Used  . Alcohol Use: No   OB History    Gravida Para Term Preterm AB TAB SAB Ectopic Multiple Living   2 2 2       2      Review of Systems  Constitutional: Negative for fever, chills, diaphoresis, activity change, appetite change and fatigue.  HENT: Negative for congestion, facial swelling, rhinorrhea and sore throat.   Eyes: Negative for photophobia and discharge.  Respiratory: Negative for cough, chest tightness and shortness of breath.   Cardiovascular: Negative for chest pain, palpitations and leg swelling.  Gastrointestinal: Positive for nausea and abdominal pain. Negative for diarrhea, constipation and flatus. Vomiting: dry heaving.  Endocrine: Negative for polydipsia and polyuria.  Genitourinary: Negative for dysuria, frequency, difficulty urinating and  pelvic pain.  Musculoskeletal: Negative for back pain, arthralgias, neck pain and neck stiffness.  Skin: Negative for color change and wound.  Allergic/Immunologic: Negative for immunocompromised state.  Neurological: Negative for facial asymmetry, weakness, numbness and headaches.  Hematological: Does not bruise/bleed easily.  Psychiatric/Behavioral: Negative for confusion and agitation.      Allergies  Adhesive  Home Medications   Prior  to Admission medications   Medication Sig Start Date End Date Taking? Authorizing Provider  Acetaminophen-Caffeine (EXCEDRIN QUICKTABS PO) Take 2 tablets by mouth once.   Yes Historical Provider, MD  ibuprofen (ADVIL,MOTRIN) 200 MG tablet Take 400 mg by mouth every 8 (eight) hours as needed for pain.   Yes Historical Provider, MD  influenza vac recombinant HA trivalent (FLUBLOK) injection Inject 0.5 mLs into the muscle once.   Yes Historical Provider, MD  levonorgestrel (MIRENA) 20 MCG/24HR IUD 1 each by Intrauterine route once. February 2014   Yes Historical Provider, MD  Cholecalciferol (VITAMIN D) 2000 UNITS tablet Take 1 tablet (2,000 Units total) by mouth daily. Patient not taking: Reported on 04/02/2014 03/31/13   Richarda Overlie, MD  ciprofloxacin (CIPRO) 500 MG tablet Take 1 tablet (500 mg total) by mouth 2 (two) times daily. One po bid x 7 days 04/02/14   Toy Cookey, MD  citalopram (CELEXA) 40 MG tablet TAKE 1 TABLET (40 MG TOTAL) BY MOUTH DAILY. Patient not taking: Reported on 04/02/2014    Doris Cheadle, MD  Magnesium Oxide 400 MG CAPS 1 tablet daily, Patient not taking: Reported on 04/02/2014 03/31/13   Richarda Overlie, MD  ondansetron (ZOFRAN) 4 MG tablet Take 1 tablet (4 mg total) by mouth every 6 (six) hours. 04/02/14   Toy Cookey, MD  oxyCODONE-acetaminophen (PERCOCET/ROXICET) 5-325 MG per tablet Take 1-2 tablets by mouth every 6 (six) hours as needed for moderate pain or severe pain. 04/02/14   Toy Cookey, MD  topiramate (TOPAMAX) 50 MG tablet Take 1 tablet (50 mg total) by mouth at bedtime. Patient not taking: Reported on 04/02/2014 11/19/13   Doris Cheadle, MD   BP 142/76 mmHg  Pulse 87  Temp(Src) 98.1 F (36.7 C) (Oral)  Resp 18  SpO2 95% Physical Exam  Constitutional: She is oriented to person, place, and time. She appears well-developed and well-nourished. No distress.  HENT:  Head: Normocephalic and atraumatic.  Mouth/Throat: No oropharyngeal exudate.  Eyes: Pupils are  equal, round, and reactive to light.  Neck: Normal range of motion. Neck supple.  Cardiovascular: Normal rate, regular rhythm and normal heart sounds.  Exam reveals no gallop and no friction rub.   No murmur heard. Pulmonary/Chest: Effort normal and breath sounds normal. No respiratory distress. She has no wheezes. She has no rales.  Abdominal: Soft. Bowel sounds are normal. She exhibits no distension and no mass. There is tenderness in the left lower quadrant. There is no rebound and no guarding.  Musculoskeletal: Normal range of motion. She exhibits no edema or tenderness.  Neurological: She is alert and oriented to person, place, and time.  Skin: Skin is warm and dry.  Psychiatric: She has a normal mood and affect.    ED Course  Procedures (including critical care time) Labs Review Labs Reviewed  CBC WITH DIFFERENTIAL - Abnormal; Notable for the following:    MCH 25.8 (*)    All other components within normal limits  COMPREHENSIVE METABOLIC PANEL - Abnormal; Notable for the following:    Potassium 3.6 (*)    CO2 17 (*)    Glucose, Bld  103 (*)    GFR calc non Af Amer 66 (*)    GFR calc Af Amer 76 (*)    All other components within normal limits  URINALYSIS, ROUTINE W REFLEX MICROSCOPIC - Abnormal; Notable for the following:    APPearance CLOUDY (*)    Hgb urine dipstick SMALL (*)    Nitrite POSITIVE (*)    Leukocytes, UA LARGE (*)    All other components within normal limits  URINE MICROSCOPIC-ADD ON - Abnormal; Notable for the following:    Squamous Epithelial / LPF MANY (*)    Bacteria, UA MANY (*)    All other components within normal limits  URINE CULTURE  POC URINE PREG, ED    Imaging Review Ct Renal Stone Study  04/02/2014   CLINICAL DATA:  Left groin and suprapubic pain, passed kidney stone Monday  EXAM: CT ABDOMEN AND PELVIS WITHOUT CONTRAST  TECHNIQUE: Multidetector CT imaging of the abdomen and pelvis was performed following the standard protocol without IV  contrast.  COMPARISON:  06/30/2012  FINDINGS: Lung bases are unremarkable. Sagittal images of the spine shows mild degenerative changes lower thoracic and lumbar spine. There is mild disc space flattening at L5-S1 level.  Unenhanced liver shows no biliary ductal dilatation.  Unenhanced pancreas, spleen and adrenal glands are unremarkable. No calcified gallstones are noted within gallbladder. Extensive caliceal calcifications are noted bilateral kidney. The largest calcification in midpole of the right kidney measures 8 mm. Largest caliceal calcification midpole of the left kidney measures 4.5 mm.  No right hydronephrosis. No right ureteral calculi. There is mild left hydronephrosis and mild left hydroureter.  In axial image 75 There is 4.7 x 4.7 mm calcified calculus in left UVJ. The urinary bladder is under distended. No calcified calculi are noted within urinary bladder. IUD is noted within anteflexed uterus. Bilateral ovary is unremarkable.  Tiny umbilical hernia containing fat without evidence of acute complication. Normal appendix axial image 47. No pericecal inflammation.  No small bowel obstruction.  No ascites or free air.  No adenopathy.  IMPRESSION: 1. Extensive bilateral nephrolithiasis.  No right hydronephrosis. 2. There is mild left hydronephrosis and left hydroureter. There is 4.7 mm calcified obstructive calculus in left UVJ. 3. IUD within anteflexed uterus.  No adnexal mass. 4. Normal retrocecal appendix.  No pericecal inflammation. 5. No small bowel obstruction.   Electronically Signed   By: Natasha MeadLiviu  Pop M.D.   On: 04/02/2014 09:30     EKG Interpretation None      MDM   Final diagnoses:  LLQ pain  Ureterolithiasis  UTI (lower urinary tract infection)    Pt is a 38 y.o. female with Pmhx as above who presents with sharp LLQ pain, constant with around 5am this morning. She reports hx of recurrent kidney stones, and states this feel similar to prior stones. She has hx of multiple ureteral  stent placement. She has having nausea, dry heaves, no fever, chills, dysuria, hematuria or back pain. On PE, VSS, pt uncomfortable, but in NAD. +ttp LLQ q/o rebound or guarding. CT stone study ordered given hx, was positive for a 4mm distal L ureteral obstructing stone. CBC, CMP, grossly nml. UA with large positive leukocytes, nitrites, and many epithelial cells (questionably infected). Spoke w/ Dr. Vernie Ammonsttelin, who rec tx with cipro, office f/u tomorrow. Will d/c home w/ percocet for pain, zofran for nausea. Return precautions given for new or worsening symptoms including worsening or uncontrolled pain, fever, inability to tolerate PO.  Toy Cookey, MD 04/02/14 [phone]

## 2014-04-02 NOTE — Discharge Instructions (Signed)

## 2014-04-02 NOTE — ED Notes (Addendum)
Pt reports passed kidney stone on Monday. Pt reports new onset of left groin pain starting 1700 on 04/02/2014. Pt states "unable to pass this one." Pt reports nausea related to pain.

## 2014-04-04 LAB — URINE CULTURE: Colony Count: >=100000

## 2014-04-07 ENCOUNTER — Telehealth (HOSPITAL_COMMUNITY): Payer: Self-pay

## 2014-04-07 NOTE — Telephone Encounter (Signed)
Post ED Visit - Positive Culture Follow-up  Culture report reviewed by antimicrobial stewardship pharmacist: []  Wes Dulaney, Pharm.D., BCPS []  Celedonio MiyamotoJeremy Frens, Pharm.D., BCPS [x]  Georgina PillionElizabeth Martin, Pharm.D., BCPS []  Pine ValleyMinh Pham, [address]Pharm[address]D., BCPS, AAHIVP []  Estella HuskMichelle Turner, Pharm.D., BCPS, AAHIVP []  Babs BertinHaley Baird, Pharm.D.  Positive Urine culture, >/= 100,000 colonies -> E Coli Treated with Ciprofloxacin, organism sensitive to the same and no further patient follow-up is required at this time.   Phyllis House, Phyllis House 04/07/2014, 5:35 AM

## 2014-09-28 ENCOUNTER — Emergency Department (HOSPITAL_COMMUNITY)
Admission: EM | Admit: 2014-09-28 | Discharge: 2014-09-28 | Disposition: A | Discharge status: Home / Self Care | Payer: BLUE CROSS/BLUE SHIELD | Attending: Emergency Medicine | Admitting: Emergency Medicine

## 2014-09-28 ENCOUNTER — Encounter (HOSPITAL_COMMUNITY): Payer: Self-pay

## 2014-09-28 ENCOUNTER — Emergency Department (HOSPITAL_COMMUNITY): Payer: BLUE CROSS/BLUE SHIELD

## 2014-09-28 DIAGNOSIS — Z8619 Personal history of other infectious and parasitic diseases: Secondary | ICD-10-CM | POA: Diagnosis not present

## 2014-09-28 DIAGNOSIS — Z79899 Other long term (current) drug therapy: Secondary | ICD-10-CM | POA: Diagnosis not present

## 2014-09-28 DIAGNOSIS — Z8639 Personal history of other endocrine, nutritional and metabolic disease: Secondary | ICD-10-CM | POA: Diagnosis not present

## 2014-09-28 DIAGNOSIS — G43909 Migraine, unspecified, not intractable, without status migrainosus: Secondary | ICD-10-CM | POA: Insufficient documentation

## 2014-09-28 DIAGNOSIS — R1032 Left lower quadrant pain: Secondary | ICD-10-CM | POA: Diagnosis present

## 2014-09-28 DIAGNOSIS — F53 Puerperal psychosis: Secondary | ICD-10-CM | POA: Diagnosis not present

## 2014-09-28 DIAGNOSIS — Z792 Long term (current) use of antibiotics: Secondary | ICD-10-CM | POA: Insufficient documentation

## 2014-09-28 DIAGNOSIS — Z3202 Encounter for pregnancy test, result negative: Secondary | ICD-10-CM | POA: Diagnosis not present

## 2014-09-28 DIAGNOSIS — N200 Calculus of kidney: Secondary | ICD-10-CM | POA: Diagnosis not present

## 2014-09-28 LAB — URINE MICROSCOPIC-ADD ON

## 2014-09-28 LAB — COMPREHENSIVE METABOLIC PANEL
ALT: 33 U/L (ref 14–54)
ANION GAP: 8 (ref 5–15)
AST: 25 U/L (ref 15–41)
Albumin: 4.5 g/dL (ref 3.5–5.0)
Alkaline Phosphatase: 56 U/L (ref 38–126)
BUN: 17 mg/dL (ref 6–20)
CO2: 18 mmol/L — AB (ref 22–32)
Calcium: 9.1 mg/dL (ref 8.9–10.3)
Chloride: 111 mmol/L (ref 101–111)
Creatinine, Ser: 1 mg/dL (ref 0.44–1.00)
GFR calc Af Amer: >60 mL/min (ref >60)
GFR calc non Af Amer: >60 mL/min (ref >60)
GLUCOSE: 103 mg/dL — AB (ref 65–99)
Potassium: 3.9 mmol/L (ref 3.5–5.1)
Sodium: 137 mmol/L (ref 135–145)
TOTAL PROTEIN: 7.4 g/dL (ref 6.5–8.1)
Total Bilirubin: 0.4 mg/dL (ref 0.3–1.2)

## 2014-09-28 LAB — CBC WITH DIFFERENTIAL/PLATELET
BASOS PCT: 2 % — AB (ref 0–1)
Basophils Absolute: 0.1 10*3/uL (ref 0.0–0.1)
Eosinophils Absolute: 0.1 10*3/uL (ref 0.0–0.7)
Eosinophils Relative: 3 % (ref 0–5)
HCT: 41.4 % (ref 36.0–46.0)
Hemoglobin: 14 g/dL (ref 12.0–15.0)
LYMPHS ABS: 0.9 10*3/uL (ref 0.7–4.0)
LYMPHS PCT: 22 % (ref 12–46)
MCH: 26.5 pg (ref 26.0–34.0)
MCHC: 33.8 g/dL (ref 30.0–36.0)
MCV: 78.3 fL (ref 78.0–100.0)
MONOS PCT: 8 % (ref 3–12)
Monocytes Absolute: 0.3 10*3/uL (ref 0.1–1.0)
NEUTROS PCT: 65 % (ref 43–77)
Neutro Abs: 2.7 10*3/uL (ref 1.7–7.7)
PLATELETS: 150 10*3/uL (ref 150–400)
RBC: 5.29 MIL/uL — AB (ref 3.87–5.11)
RDW: 13.7 % (ref 11.5–15.5)
WBC: 4 10*3/uL (ref 4.0–10.5)

## 2014-09-28 LAB — URINALYSIS, ROUTINE W REFLEX MICROSCOPIC
Bilirubin Urine: NEGATIVE
GLUCOSE, UA: NEGATIVE mg/dL
Ketones, ur: NEGATIVE mg/dL
Nitrite: NEGATIVE
Protein, ur: NEGATIVE mg/dL
Specific Gravity, Urine: 1.015 (ref 1.005–1.030)
Urobilinogen, UA: 0.2 mg/dL (ref 0.0–1.0)
pH: 7 (ref 5.0–8.0)

## 2014-09-28 LAB — PREGNANCY, URINE: PREG TEST UR: NEGATIVE

## 2014-09-28 LAB — LIPASE, BLOOD: Lipase: 28 U/L (ref 22–51)

## 2014-09-28 MED ORDER — OXYCODONE-ACETAMINOPHEN 5-325 MG PO TABS: 1.0000 | ORAL_TABLET | Freq: Four times a day (QID) | ORAL | Status: DC | PRN

## 2014-09-28 MED ORDER — ONDANSETRON HCL 4 MG PO TABS: 4.0000 mg | ORAL_TABLET | Freq: Four times a day (QID) | ORAL | Status: DC

## 2014-09-28 MED ORDER — OXYCODONE-ACETAMINOPHEN 5-325 MG PO TABS
2.0000 | ORAL_TABLET | Freq: Once | ORAL | Status: AC
  Administered 2014-09-28: 2 via ORAL
  Filled 2014-09-28: qty 2

## 2014-09-28 MED ORDER — HYDROMORPHONE HCL 1 MG/ML IJ SOLN
1.0000 mg | Freq: Once | INTRAMUSCULAR | Status: AC
  Administered 2014-09-28: 1 mg via INTRAVENOUS
  Filled 2014-09-28: qty 1

## 2014-09-28 MED ORDER — SODIUM CHLORIDE 0.9 % IV BOLUS (SEPSIS)
1000.0000 mL | Freq: Once | INTRAVENOUS | Status: AC
  Administered 2014-09-28: 1000 mL via INTRAVENOUS

## 2014-09-28 MED ORDER — ONDANSETRON HCL 4 MG/2ML IJ SOLN
4.0000 mg | Freq: Once | INTRAMUSCULAR | Status: AC
  Administered 2014-09-28: 4 mg via INTRAVENOUS
  Filled 2014-09-28: qty 2

## 2014-09-28 NOTE — Discharge Instructions (Signed)
Follow-up with urology tomorrow. Return to the ER if any worsening symptoms, worsening of your nausea or vomiting, high fever greater than 100.5, severe abdominal pain, inability to urinate.   Kidney Stones Kidney stones (urolithiasis) are deposits that form inside your kidneys. The intense pain is caused by the stone moving through the urinary tract. When the stone moves, the ureter goes into spasm around the stone. The stone is usually passed in the urine.  CAUSES   A disorder that makes certain neck glands produce too much parathyroid hormone (primary hyperparathyroidism).  A buildup of uric acid crystals, similar to gout in your joints.  Narrowing (stricture) of the ureter.  A kidney obstruction present at birth (congenital obstruction).  Previous surgery on the kidney or ureters.  Numerous kidney infections. SYMPTOMS   Feeling sick to your stomach (nauseous).  Throwing up (vomiting).  Blood in the urine (hematuria).  Pain that usually spreads (radiates) to the groin.  Frequency or urgency of urination. DIAGNOSIS   Taking a history and physical exam.  Blood or urine tests.  CT scan.  Occasionally, an examination of the inside of the urinary bladder (cystoscopy) is performed. TREATMENT   Observation.  Increasing your fluid intake.  Extracorporeal shock wave lithotripsy--This is a noninvasive procedure that uses shock waves to break up kidney stones.  Surgery may be needed if you have severe pain or persistent obstruction. There are various surgical procedures. Most of the procedures are performed with the use of small instruments. Only small incisions are needed to accommodate these instruments, so recovery time is minimized. The size, location, and chemical composition are all important variables that will determine the proper choice of action for you. Talk to your health care provider to better understand your situation so that you will minimize the risk of injury  to yourself and your kidney.  HOME CARE INSTRUCTIONS   Drink enough water and fluids to keep your urine clear or pale yellow. This will help you to pass the stone or stone fragments.  Strain all urine through the provided strainer. Keep all particulate matter and stones for your health care provider to see. The stone causing the pain may be as small as a grain of salt. It is very important to use the strainer each and every time you pass your urine. The collection of your stone will allow your health care provider to analyze it and verify that a stone has actually passed. The stone analysis will often identify what you can do to reduce the incidence of recurrences.  Only take over-the-counter or prescription medicines for pain, discomfort, or fever as directed by your health care provider.  Make a follow-up appointment with your health care provider as directed.  Get follow-up X-rays if required. The absence of pain does not always mean that the stone has passed. It may have only stopped moving. If the urine remains completely obstructed, it can cause loss of kidney function or even complete destruction of the kidney. It is your responsibility to make sure X-rays and follow-ups are completed. Ultrasounds of the kidney can show blockages and the status of the kidney. Ultrasounds are not associated with any radiation and can be performed easily in a matter of minutes. SEEK MEDICAL CARE IF:  You experience pain that is progressive and unresponsive to any pain medicine you have been prescribed. SEEK IMMEDIATE MEDICAL CARE IF:   Pain cannot be controlled with the prescribed medicine.  You have a fever or shaking chills.  The severity  or intensity of pain increases over 18 hours and is not relieved by pain medicine.  You develop a new onset of abdominal pain.  You feel faint or pass out.  You are unable to urinate. MAKE SURE YOU:   Understand these instructions.  Will watch your  condition.  Will get help right away if you are not doing well or get worse. Document Released: 04/17/2005 Document Revised: 12/18/2012 Document Reviewed: 09/18/2012 [facility] Patient Information 2015 [place], [place]. This information is not intended to replace advice given to you by your health care provider. Make sure you discuss any questions you have with your health care provider.

## 2014-09-28 NOTE — ED Notes (Signed)
Pt c/o hematuria and decreased urinary output x 3 days and LLQ pain x 1 day.  Pain score 8/10.  Pt sts "I know, I have a kidney stone and it's about ready to pass.  I pass them all the time."  Pt has been taking ibuprofen for pain.

## 2014-09-28 NOTE — ED Provider Notes (Signed)
CSN: [id number]     Arrival date & time 09/28/14  0945 History   First MD Initiated Contact with Patient 09/28/14 [phone]     Chief Complaint  Patient presents with  . Abdominal Pain     (Consider location/radiation/quality/duration/timing/severity/associated sxs/prior Treatment) HPI Phyllis House is a 39 year old female past medical history of renal stones who presents the ER complaining of left lower quadrant abdominal pain. Patient states her pain began gradually approximately 2 days ago, and today has worsened. Patient states she has had multiple kidney stones in the past, and her pain today feels identical. Patient states she typically gets pain when the stone moves close to her bladder, and they typically pass without difficulty. Patient states she is attempted using Aleve and ibuprofen to help her pain, however has not been known to get any relief. Patient reports associated nausea, hematuria, and mild diarrhea. Patient denies fever, chills, chest pain, shortness of breath, dizziness, weakness.  Past Medical History  Diagnosis Date  . H/O varicella   . Depression     PP  . Left ureteral calculus   . Migraine   . History of herpes genitalis     LAST BREAK OUT 6 YRS AGO  . History of kidney stones   . Renal calculi     BILATERAL  . Polycystic ovarian syndrome    Past Surgical History  Procedure Laterality Date  . Cystoscopy w/ ureteral stent placement Left 07/01/2012    Procedure: CYSTOSCOPY WITH RETROGRADE PYELOGRAM/URETERAL STENT PLACEMENT;  Surgeon: Milford Cage, MD;  Location: [facility];  Service: Urology;  Laterality: Left;  . Cystoscopy/retrograde/ureteroscopy/stone extraction with basket  AGE 66  . Tonsillectomy  AS CHILD  . Cystoscopy with retrograde pyelogram, ureteroscopy and stent placement Left 07/15/2012    Procedure: CYSTOSCOPY WITH left RETROGRADE PYELOGRAM,left  URETEROSCOPY AND left STENT exchange ;  Surgeon: Milford Cage, MD;  Location: [facility];  Service: Urology;  Laterality: Left;  LEFT URETER STENT EXCHANGE    . Holmium laser application Left 07/15/2012    Procedure: HOLMIUM LASER APPLICATION;  Surgeon: Milford Cage, MD;  Location: [facility];  Service: Urology;  Laterality: Left;   Family History  Problem Relation Age of Onset  . Thyroid disease Mother   . Depression Mother   . Cancer Father     leaukemia  . Thyroid disease Brother   . Diabetes Maternal Aunt   . Cancer Maternal Grandmother     GI  . Diabetes Maternal Grandfather   . Heart disease Paternal Grandmother    History  Substance Use Topics  . Smoking status: Never Smoker   . Smokeless tobacco: Never Used  . Alcohol Use: No   OB History    Gravida Para Term Preterm AB TAB SAB Ectopic Multiple Living   Review of Systems  Constitutional: Negative for fever.  HENT: Negative for trouble swallowing.   Eyes: Negative for visual disturbance.  Respiratory: Negative for shortness of breath.   Cardiovascular: Negative for chest pain.  Gastrointestinal: Positive for nausea and abdominal pain. Negative for vomiting.  Genitourinary: Negative for dysuria.  Musculoskeletal: Negative for neck pain.  Skin: Negative for rash.  Neurological: Negative for dizziness, weakness and numbness.  Psychiatric/Behavioral: Negative.     Allergies  Adhesive  Home Medications   Prior to Admission medications   Medication Sig Start Date End Date Taking? Authorizing Provider  Acetaminophen-Caffeine (  EXCEDRIN QUICKTABS PO) Take 2 tablets by mouth every 6 (six) hours as needed (headache).    Yes Historical Provider, MD  ibuprofen (ADVIL,MOTRIN) 200 MG tablet Take 400 mg by mouth every 8 (eight) hours as needed for pain.   Yes Historical Provider, MD  levonorgestrel (MIRENA) 20 MCG/24HR IUD 1 each by Intrauterine route once. February 2014   Yes Historical Provider, MD  naproxen sodium (ANAPROX) 220 MG tablet Take  220 mg by mouth every 12 (twelve) hours as needed (pain).   Yes Historical Provider, MD  Cholecalciferol (VITAMIN D) 2000 UNITS tablet Take 1 tablet (2,000 Units total) by mouth daily. Patient not taking: Reported on 04/02/2014 03/31/13   Richarda OverlieNayana Abrol, MD  ciprofloxacin (CIPRO) 500 MG tablet Take 1 tablet (500 mg total) by mouth 2 (two) times daily. One po bid x 7 days Patient not taking: Reported on 09/28/2014 04/02/14   Toy CookeyMegan Docherty, MD  citalopram (CELEXA) 40 MG tablet TAKE 1 TABLET (40 MG TOTAL) BY MOUTH DAILY. Patient not taking: Reported on 04/02/2014    Doris Cheadleeepak Advani, MD  Magnesium Oxide 400 MG CAPS 1 tablet daily, Patient not taking: Reported on 04/02/2014 03/31/13   Richarda OverlieNayana Abrol, MD  ondansetron (ZOFRAN) 4 MG tablet Take 1 tablet (4 mg total) by mouth every 6 (six) hours. Patient not taking: Reported on 09/28/2014 04/02/14   Toy CookeyMegan Docherty, MD  ondansetron (ZOFRAN) 4 MG tablet Take 1 tablet (4 mg total) by mouth every 6 (six) hours. 09/28/14   Ladona MowJoe Shakina Choy, PA-C  oxyCODONE-acetaminophen (PERCOCET) 5-325 MG per tablet Take 1-2 tablets by mouth every 6 (six) hours as needed. 09/28/14   Ladona MowJoe Brionna Romanek, PA-C  topiramate (TOPAMAX) 50 MG tablet Take 1 tablet (50 mg total) by mouth at bedtime. Patient not taking: Reported on 04/02/2014 11/19/13   Doris Cheadleeepak Advani, MD   BP 138/76 mmHg  Pulse 60  Temp(Src) 97.8 F (36.6 C) (Oral)  Resp 17  SpO2 99% Physical Exam  Constitutional: She is oriented to person, place, and time. She appears well-developed and well-nourished. No distress.  HENT:  Head: Normocephalic and atraumatic.  Mouth/Throat: Oropharynx is clear and moist. No oropharyngeal exudate.  Eyes: Right eye exhibits no discharge. Left eye exhibits no discharge. No scleral icterus.  Neck: Normal range of motion.  Cardiovascular: Normal rate, regular rhythm and normal heart sounds.   No murmur heard. Pulmonary/Chest: Effort normal and breath sounds normal. No respiratory distress.  Abdominal: Soft. There  is tenderness in the left lower quadrant. There is no rigidity, no guarding, no tenderness at McBurney's point and negative Murphy's sign.  Musculoskeletal: Normal range of motion. She exhibits no edema or tenderness.  Neurological: She is alert and oriented to person, place, and time. No cranial nerve deficit. Coordination normal.  Skin: Skin is warm and dry. No rash noted. She is not diaphoretic.  Psychiatric: She has a normal mood and affect.  Nursing note and vitals reviewed.   ED Course  Procedures (including critical care time) Labs Review Labs Reviewed  URINALYSIS, ROUTINE W REFLEX MICROSCOPIC (NOT AT [facility]) - Abnormal; Notable for the following:    APPearance CLOUDY (*)    Hgb urine dipstick MODERATE (*)    Leukocytes, UA SMALL (*)    All other components within normal limits  CBC WITH DIFFERENTIAL/PLATELET - Abnormal; Notable for the following:    RBC 5.29 (*)    Basophils Relative 2 (*)    All other components within normal limits  COMPREHENSIVE METABOLIC PANEL - Abnormal; Notable for the  following:    CO2 18 (*)    Glucose, Bld 103 (*)    All other components within normal limits  URINE MICROSCOPIC-ADD ON - Abnormal; Notable for the following:    Bacteria, UA FEW (*)    All other components within normal limits  URINE CULTURE  PREGNANCY, URINE  LIPASE, BLOOD    Imaging Review US Renal  09/28/2014   CLINICAL DATA:  Left lower quadrant pain x1 day  EXAM: RENAL / URINARY TRACT ULTRASOUND COMPLETE  COMPARISON:  CT abdomen pelvis dated 04/02/2014  FINDINGS: Right Kidney:  Length: 11.9 cm. 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 No hydronephrosis.  Left Kidney:  Length: 14.2 cm. 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 Moderate hydronephrosis.  Bladder:  Bladder is within normal limits.  Left bladder jet not visualized.  IMPRESSION: Moderate left hydronephrosis. Left bladder jet  not visualized. This appearance suggests a left ureteral calculus, although this is not visualized on ultrasound.  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   Electronically Signed   By: Charline Bills M.D.   On: 09/28/2014 13:56     EKG Interpretation None      MDM   Final diagnoses:  LLQ abdominal pain  Kidney stone    Patient with signs and symptoms consistent with and identical to previous kidney stones. Patient's renal function within normal limits, few bacteria noted on UA, however no gross infection noted. Labs otherwise unremarkable for acute pathology. After multiple exams, patient's pain is controlled, abdominal exam is benign. No concern for acute abdomen.   Renal ultrasound with impression: Moderate left hydronephrosis. Left bladder jet not visualized. This appearance suggests a left ureteral calculus, although this is not visualized on ultrasound.  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  I discussed these findings with patient, offered CT abdomen pelvis for further evaluation of most likely kidney stone and left UVJ versus sending patient home and have her follow-up with urology as outpatient. Patient states at this point she preferred to go home, as she is currently pain controlled, she states that [facility] typically is able to work her in easily, and she preferred to go home and follow up as outpatient with urology. We'll send urine culture for follow-up on patient's urine. Patient afebrile, hemodynamically stable and in no acute distress. Patient's abdominal exam is benign after multiple examinations. Patient stable for discharge. Patient sent with symptomatically therapy, return precautions discussed, patient verbalizes understanding and agreement of this plan.  BP 138/76 mmHg  Pulse 60  Temp(Src) 97.8 F (36.6 C) (Oral)  Resp 17  SpO2  99%  Signed,  Ladona Mow, PA-C 4:02 PM  Patient discussed with Dr. Mirian Mo, MD  Ladona Mow, PA-C 09/28/14 1610  Mirian Mo, MD 10/03/14 [phone]

## 2014-09-30 LAB — URINE CULTURE: Colony Count: 15000

## 2016-07-24 ENCOUNTER — Encounter (HOSPITAL_COMMUNITY): Payer: Self-pay | Admitting: Emergency Medicine

## 2016-07-24 ENCOUNTER — Emergency Department (HOSPITAL_COMMUNITY)
Admission: EM | Admit: 2016-07-24 | Discharge: 2016-07-24 | Disposition: A | Discharge status: Home / Self Care | Payer: BC Managed Care – PPO | Attending: Emergency Medicine | Admitting: Emergency Medicine

## 2016-07-24 ENCOUNTER — Emergency Department (HOSPITAL_COMMUNITY): Payer: BC Managed Care – PPO

## 2016-07-24 DIAGNOSIS — N132 Hydronephrosis with renal and ureteral calculous obstruction: Secondary | ICD-10-CM | POA: Diagnosis not present

## 2016-07-24 DIAGNOSIS — N200 Calculus of kidney: Secondary | ICD-10-CM

## 2016-07-24 DIAGNOSIS — R1031 Right lower quadrant pain: Secondary | ICD-10-CM | POA: Diagnosis present

## 2016-07-24 LAB — URINALYSIS, ROUTINE W REFLEX MICROSCOPIC
Bacteria, UA: NONE SEEN
Bilirubin Urine: NEGATIVE
GLUCOSE, UA: NEGATIVE mg/dL
Ketones, ur: NEGATIVE mg/dL
Nitrite: NEGATIVE
PH: 8 (ref 5.0–8.0)
Protein, ur: NEGATIVE mg/dL
Specific Gravity, Urine: 1.009 (ref 1.005–1.030)

## 2016-07-24 LAB — POC URINE PREG, ED: Preg Test, Ur: NEGATIVE

## 2016-07-24 MED ORDER — OXYCODONE-ACETAMINOPHEN 5-325 MG PO TABS: 1.0000 | ORAL_TABLET | Freq: Four times a day (QID) | ORAL | 0 refills | Status: DC | PRN

## 2016-07-24 MED ORDER — ONDANSETRON HCL 4 MG PO TABS: 4.0000 mg | ORAL_TABLET | Freq: Three times a day (TID) | ORAL | 0 refills | Status: DC | PRN

## 2016-07-24 MED ORDER — OXYCODONE-ACETAMINOPHEN 5-325 MG PO TABS
1.0000 | ORAL_TABLET | Freq: Once | ORAL | Status: AC
  Administered 2016-07-24: 1 via ORAL
  Filled 2016-07-24: qty 1

## 2016-07-24 MED ORDER — KETOROLAC TROMETHAMINE 30 MG/ML IJ SOLN
30.0000 mg | Freq: Once | INTRAMUSCULAR | Status: AC
  Administered 2016-07-24: 30 mg via INTRAMUSCULAR
  Filled 2016-07-24: qty 1

## 2016-07-24 MED ORDER — ONDANSETRON 4 MG PO TBDP
4.0000 mg | ORAL_TABLET | Freq: Once | ORAL | Status: AC
  Administered 2016-07-24: 4 mg via ORAL
  Filled 2016-07-24: qty 1

## 2016-07-24 NOTE — ED Provider Notes (Signed)
[facility]-EMERGENCY DEPT Provider Note   CSN: [id number] Arrival date & time: 07/24/16  4098     History   Chief Complaint Chief Complaint  Patient presents with  . Flank Pain    HPI Phyllis House is a 41 y.o. female.  HPI   41 year old female with history of recurrent kidney stones, polycystic ovarian syndrome presenting complaining of right groin pain. Patient states she has many kidney stones in the past and usually able to pass it at home. She did require several stenting in the past. She woke up this morning with pain to the right groin and hematuria. Pain is sharp, felt very similar to prior kidney stones. She did endorse nausea when the pain is intense. She denies fever, lightheadedness, dizziness, dysuria, vaginal bleeding or vaginal discharge. She follow-up with a last urology. Her pain is currently moderate in severity and nonradiating.  Past Medical History:  Diagnosis Date  . Depression    PP  . H/O varicella   . History of herpes genitalis    LAST BREAK OUT 6 YRS AGO  . History of kidney stones   . Left ureteral calculus   . Migraine   . Polycystic ovarian syndrome   . Renal calculi    BILATERAL    Patient Active Problem List   Diagnosis Date Noted  . Migraine without aura and without status migrainosus, not intractable 11/19/2013  . Other malaise and fatigue 03/14/2013  . Hypokalemia 12/26/2012  . Infection of urinary tract 07/02/2012  . Ureteral calculus, left 07/01/2012  . Fever, unspecified 07/01/2012    Past Surgical History:  Procedure Laterality Date  . CYSTOSCOPY W/ URETERAL STENT PLACEMENT Left 07/01/2012   Procedure: CYSTOSCOPY WITH RETROGRADE PYELOGRAM/URETERAL STENT PLACEMENT;  Surgeon: Milford Cage, MD;  Location: [facility];  Service: Urology;  Laterality: Left;  . CYSTOSCOPY WITH RETROGRADE PYELOGRAM, URETEROSCOPY AND STENT PLACEMENT Left 07/15/2012   Procedure: CYSTOSCOPY WITH left RETROGRADE PYELOGRAM,left  URETEROSCOPY AND left STENT  exchange ;  Surgeon: Milford Cage, MD;  Location: [facility];  Service: Urology;  Laterality: Left;  LEFT URETER STENT EXCHANGE    . CYSTOSCOPY/RETROGRADE/URETEROSCOPY/STONE EXTRACTION WITH BASKET  AGE 30  . HOLMIUM LASER APPLICATION Left 07/15/2012   Procedure: HOLMIUM LASER APPLICATION;  Surgeon: Milford Cage, MD;  Location: [facility];  Service: Urology;  Laterality: Left;  . TONSILLECTOMY  AS CHILD    OB History    Gravida Para Term Preterm AB Living   2 2 2     2    SAB TAB Ectopic Multiple Live Births           2       Home Medications    Prior to Admission medications   Medication Sig Start Date End Date Taking? Authorizing Provider  Acetaminophen-Caffeine (EXCEDRIN QUICKTABS PO) Take 2 tablets by mouth every 6 (six) hours as needed (headache).     Historical Provider, MD  Cholecalciferol (VITAMIN D) 2000 UNITS tablet Take 1 tablet (2,000 Units total) by mouth daily. Patient not taking: Reported on 04/02/2014 03/31/13   Richarda Overlie, MD  ciprofloxacin (CIPRO) 500 MG tablet Take 1 tablet (500 mg total) by mouth 2 (two) times daily. One po bid x 7 days Patient not taking: Reported on 09/28/2014 04/02/14   Toy Cookey, MD  citalopram (CELEXA) 40 MG tablet TAKE 1 TABLET (40 MG TOTAL) BY MOUTH DAILY. Patient not taking: Reported on 04/02/2014    Doris Cheadle, MD  ibuprofen (ADVIL,MOTRIN) 200 MG  tablet Take 400 mg by mouth every 8 (eight) hours as needed for pain.    Historical Provider, MD  levonorgestrel (MIRENA) 20 MCG/24HR IUD 1 each by Intrauterine route once. February 2014    Historical Provider, MD  Magnesium Oxide 400 MG CAPS 1 tablet daily, Patient not taking: Reported on 04/02/2014 03/31/13   Richarda OverlieNayana Abrol, MD  naproxen sodium (ANAPROX) 220 MG tablet Take 220 mg by mouth every 12 (twelve) hours as needed (pain).    Historical Provider, MD  ondansetron (ZOFRAN) 4 MG tablet Take 1 tablet (4 mg total) by mouth every 6 (six)  hours. Patient not taking: Reported on 09/28/2014 04/02/14   Toy CookeyMegan Docherty, MD  ondansetron (ZOFRAN) 4 MG tablet Take 1 tablet (4 mg total) by mouth every 6 (six) hours. 09/28/14   Ladona MowJoe Mintz, PA-C  oxyCODONE-acetaminophen (PERCOCET) 5-325 MG per tablet Take 1-2 tablets by mouth every 6 (six) hours as needed. 09/28/14   Ladona MowJoe Mintz, PA-C  topiramate (TOPAMAX) 50 MG tablet Take 1 tablet (50 mg total) by mouth at bedtime. Patient not taking: Reported on 04/02/2014 11/19/13   Doris Cheadleeepak Advani, MD    Family History Family History  Problem Relation Age of Onset  . Thyroid disease Mother   . Depression Mother   . Cancer Father     leaukemia  . Thyroid disease Brother   . Diabetes Maternal Aunt   . Cancer Maternal Grandmother     GI  . Diabetes Maternal Grandfather   . Heart disease Paternal Grandmother     Social History Social History  Substance Use Topics  . Smoking status: Never Smoker  . Smokeless tobacco: Never Used  . Alcohol use No     Allergies   Adhesive [tape]   Review of Systems Review of Systems  All other systems reviewed and are negative.    Physical Exam Updated Vital Signs BP 123/68 (BP Location: Left Arm)   Pulse 68   Temp 98.2 F (36.8 C) (Oral)   Resp 16   Wt 81.6 kg   SpO2 100%   BMI 31.89 kg/m   Physical Exam  Constitutional: She appears well-developed and well-nourished. No distress.  HENT:  Head: Atraumatic.  Eyes: Conjunctivae are normal.  Neck: Neck supple.  Cardiovascular: Normal rate and regular rhythm.   Pulmonary/Chest: Effort normal and breath sounds normal.  Abdominal: Soft. There is tenderness (tenderness to R groin on palpation.  no ovlerying skin changes, negative Murphy sign, no pain at McBurney's point.  ).  Genitourinary:  Genitourinary Comments: No CVA tenderness  Neurological: She is alert.  Skin: No rash noted.  Psychiatric: She has a normal mood and affect.  Nursing note and vitals reviewed.    ED Treatments / Results   Labs (all labs ordered are listed, but only abnormal results are displayed) Labs Reviewed  URINALYSIS, ROUTINE W REFLEX MICROSCOPIC - Abnormal; Notable for the following:       Result Value   APPearance HAZY (*)    Hgb urine dipstick LARGE (*)    Leukocytes, UA SMALL (*)    Squamous Epithelial / LPF 6-30 (*)    All other components within normal limits  POC URINE PREG, ED    EKG  EKG Interpretation None       Radiology [place]al  Result Date: 07/24/2016 CLINICAL DATA:  Right flank and groin pain. EXAM: RENAL / URINARY TRACT ULTRASOUND COMPLETE COMPARISON:  Ultrasound 09/28/2014.  CT 04/02/2014 FINDINGS: Right Kidney: Length: 11.8 cm. Echogenicity within normal limits.  No mass. Renal pyramids are again noted to be echodense. Moderate hydronephrosis. Left Kidney: Length: 14.1 cm. Echogenicity within normal limits. Renal pyramids are again noted to be echodense . No mass or hydronephrosis visualized. Bladder: Appears normal for degree of bladder distention. IMPRESSION: 1.  Moderate right hydronephrosis. 2. Echodense renal pyramids again noted bilaterally consistent with nephrocalcinosis and/or stone disease . Electronically Signed   By: Maisie Fus  Register   On: 07/24/2016 11:51    Procedures Procedures (including critical care time)  Medications Ordered in ED Medications  ketorolac (TORADOL) 30 MG/ML injection 30 mg (not administered)  ondansetron (ZOFRAN-ODT) disintegrating tablet 4 mg (not administered)     Initial Impression / Assessment and Plan / ED Course  I have reviewed the triage vital signs and the nursing notes.  Pertinent labs & imaging results that were available during my care of the patient were reviewed by me and considered in my medical decision making (see chart for details).     BP (!) 138/92 (BP Location: Left Arm)   Pulse 78   Temp 98.2 F (36.8 C) (Oral)   Resp 16   Ht 5\' 3"  (1.6 m)   Wt 81.6 kg   SpO2 100%   BMI 31.89 kg/m    Final Clinical  Impressions(s) / ED Diagnoses   Final diagnoses:  Kidney stone on right side    New Prescriptions Current Discharge Medication List     1:37 PM Patient with history of recurrent kidney stones here with acute onset of right groin pain that felt similar to prior kidney stones. She also endorsed blood in the urine. Her urine today did shows large amount of hemoglobin on urine dipsticks but no evidence of urinary tract infection. Her pregnancy test is negative. A renal ultrasound demonstrate moderate right hydronephrosis.  after receiving pain medication in ED, patient reports symptoms much improved and is stable to be discharged. She will follow-up closely with [facility] for further management of her condition. Return precautions discussed. In order to decrease risk of narcotic abuse. Pt's record were checked using the [place] Controlled Substance database.     Fayrene Helper, PA-C 07/24/16 1343    Lorre Nick, MD 07/25/16 1210

## 2016-07-24 NOTE — ED Notes (Signed)
US at bedside

## 2016-07-24 NOTE — ED Triage Notes (Signed)
Pt complaint of right flank pain onset 0500; hematuria; hx of kidney stones.

## 2016-07-27 ENCOUNTER — Other Ambulatory Visit: Payer: Self-pay | Admitting: Urology

## 2016-07-27 ENCOUNTER — Ambulatory Visit (HOSPITAL_COMMUNITY)
Admission: RE | Admit: 2016-07-27 | Discharge: 2016-07-27 | Disposition: A | Discharge status: Home / Self Care | Payer: BC Managed Care – PPO | Source: Ambulatory Visit | Attending: Urology | Admitting: Urology

## 2016-07-27 ENCOUNTER — Ambulatory Visit (HOSPITAL_COMMUNITY): Payer: BC Managed Care – PPO | Admitting: Anesthesiology

## 2016-07-27 ENCOUNTER — Ambulatory Visit (HOSPITAL_COMMUNITY): Payer: BC Managed Care – PPO

## 2016-07-27 ENCOUNTER — Encounter (HOSPITAL_COMMUNITY): Payer: Self-pay | Admitting: General Practice

## 2016-07-27 ENCOUNTER — Encounter (HOSPITAL_COMMUNITY)
Admission: RE | Disposition: A | Discharge status: Home / Self Care | Payer: Self-pay | Source: Ambulatory Visit | Attending: Urology

## 2016-07-27 DIAGNOSIS — N390 Urinary tract infection, site not specified: Secondary | ICD-10-CM | POA: Insufficient documentation

## 2016-07-27 DIAGNOSIS — N201 Calculus of ureter: Secondary | ICD-10-CM | POA: Diagnosis present

## 2016-07-27 DIAGNOSIS — Z87442 Personal history of urinary calculi: Secondary | ICD-10-CM | POA: Insufficient documentation

## 2016-07-27 DIAGNOSIS — Z87440 Personal history of urinary (tract) infections: Secondary | ICD-10-CM | POA: Diagnosis not present

## 2016-07-27 DIAGNOSIS — Z9889 Other specified postprocedural states: Secondary | ICD-10-CM | POA: Insufficient documentation

## 2016-07-27 DIAGNOSIS — Z419 Encounter for procedure for purposes other than remedying health state, unspecified: Secondary | ICD-10-CM

## 2016-07-27 DIAGNOSIS — Z841 Family history of disorders of kidney and ureter: Secondary | ICD-10-CM | POA: Diagnosis not present

## 2016-07-27 DIAGNOSIS — Z79899 Other long term (current) drug therapy: Secondary | ICD-10-CM | POA: Insufficient documentation

## 2016-07-27 HISTORY — PX: CYSTOSCOPY W/ URETERAL STENT PLACEMENT: SHX1429

## 2016-07-27 LAB — BASIC METABOLIC PANEL
Anion gap: 7 (ref 5–15)
BUN: 13 mg/dL (ref 6–20)
CHLORIDE: 113 mmol/L — AB (ref 101–111)
CO2: 20 mmol/L — AB (ref 22–32)
CREATININE: 0.84 mg/dL (ref 0.44–1.00)
Calcium: 9 mg/dL (ref 8.9–10.3)
GFR calc Af Amer: >60 mL/min (ref >60)
GFR calc non Af Amer: >60 mL/min (ref >60)
Glucose, Bld: 81 mg/dL (ref 65–99)
Potassium: 3.7 mmol/L (ref 3.5–5.1)
SODIUM: 140 mmol/L (ref 135–145)

## 2016-07-27 LAB — CBC
HEMATOCRIT: 38.2 % (ref 36.0–46.0)
Hemoglobin: 12.8 g/dL (ref 12.0–15.0)
MCH: 26.1 pg (ref 26.0–34.0)
MCHC: 33.5 g/dL (ref 30.0–36.0)
MCV: 78 fL (ref 78.0–100.0)
PLATELETS: 135 10*3/uL — AB (ref 150–400)
RBC: 4.9 MIL/uL (ref 3.87–5.11)
RDW: 14.8 % (ref 11.5–15.5)
WBC: 4.2 10*3/uL (ref 4.0–10.5)

## 2016-07-27 SURGERY — CYSTOSCOPY, WITH RETROGRADE PYELOGRAM AND URETERAL STENT INSERTION
Anesthesia: General | Laterality: Right

## 2016-07-27 MED ORDER — LIDOCAINE HCL (CARDIAC) 10 MG/ML IV SOLN
INTRAVENOUS | Status: DC | PRN
  Administered 2016-07-27: 100 mg via INTRAVENOUS

## 2016-07-27 MED ORDER — FENTANYL CITRATE (PF) 100 MCG/2ML IJ SOLN
INTRAMUSCULAR | Status: DC | PRN
  Administered 2016-07-27: 100 ug via INTRAVENOUS
  Administered 2016-07-27 (×2): 50 ug via INTRAVENOUS

## 2016-07-27 MED ORDER — ONDANSETRON HCL 4 MG/2ML IJ SOLN
INTRAMUSCULAR | Status: AC
  Filled 2016-07-27: qty 2

## 2016-07-27 MED ORDER — FENTANYL CITRATE (PF) 100 MCG/2ML IJ SOLN
25.0000 ug | INTRAMUSCULAR | Status: DC | PRN
Start: 2016-07-27 — End: 2016-07-27

## 2016-07-27 MED ORDER — PROPOFOL 10 MG/ML IV BOLUS
INTRAVENOUS | Status: AC
  Filled 2016-07-27: qty 20

## 2016-07-27 MED ORDER — FENTANYL CITRATE (PF) 250 MCG/5ML IJ SOLN
INTRAMUSCULAR | Status: AC
  Filled 2016-07-27: qty 5

## 2016-07-27 MED ORDER — MIDAZOLAM HCL 2 MG/2ML IJ SOLN
INTRAMUSCULAR | Status: AC
  Filled 2016-07-27: qty 2

## 2016-07-27 MED ORDER — IOHEXOL 300 MG/ML  SOLN
INTRAMUSCULAR | Status: DC | PRN
  Administered 2016-07-27: 3 mL via URETHRAL

## 2016-07-27 MED ORDER — DEXAMETHASONE SODIUM PHOSPHATE 10 MG/ML IJ SOLN
INTRAMUSCULAR | Status: AC
  Filled 2016-07-27: qty 1

## 2016-07-27 MED ORDER — MEPERIDINE HCL 50 MG/ML IJ SOLN: 6.2500 mg | INTRAMUSCULAR | Status: DC | PRN

## 2016-07-27 MED ORDER — MIDAZOLAM HCL 5 MG/5ML IJ SOLN
INTRAMUSCULAR | Status: DC | PRN
  Administered 2016-07-27: 2 mg via INTRAVENOUS

## 2016-07-27 MED ORDER — SODIUM CHLORIDE 0.9 % IR SOLN
Status: DC | PRN
  Administered 2016-07-27: 3000 mL via INTRAVESICAL

## 2016-07-27 MED ORDER — CEFAZOLIN SODIUM-DEXTROSE 2-4 GM/100ML-% IV SOLN
2.0000 g | INTRAVENOUS | Status: AC
  Administered 2016-07-27: 2 g via INTRAVENOUS
  Filled 2016-07-27: qty 100

## 2016-07-27 MED ORDER — PROPOFOL 10 MG/ML IV BOLUS
INTRAVENOUS | Status: DC | PRN
  Administered 2016-07-27: 200 mg via INTRAVENOUS

## 2016-07-27 MED ORDER — LACTATED RINGERS IV SOLN
INTRAVENOUS | Status: DC
  Administered 2016-07-27: 15:00:00 via INTRAVENOUS

## 2016-07-27 MED ORDER — METOCLOPRAMIDE HCL 5 MG/ML IJ SOLN: 10.0000 mg | Freq: Once | INTRAMUSCULAR | Status: DC | PRN

## 2016-07-27 MED ORDER — ONDANSETRON HCL 4 MG/2ML IJ SOLN
INTRAMUSCULAR | Status: DC | PRN
  Administered 2016-07-27: 4 mg via INTRAVENOUS

## 2016-07-27 MED ORDER — LACTATED RINGERS IV SOLN: INTRAVENOUS | Status: DC

## 2016-07-27 SURGICAL SUPPLY — 12 items
BAG URO CATCHER STRL LF (MISCELLANEOUS) ×3 IMPLANT
CATH INTERMIT  6FR 70CM (CATHETERS) ×3 IMPLANT
CLOTH BEACON ORANGE TIMEOUT ST (SAFETY) ×3 IMPLANT
GLOVE BIOGEL M STRL SZ7.5 (GLOVE) ×7 IMPLANT
GOWN STRL REUS W/TWL LRG LVL3 (GOWN DISPOSABLE) ×6 IMPLANT
GUIDEWIRE ANG ZIPWIRE 038X150 (WIRE) ×2 IMPLANT
GUIDEWIRE STR DUAL SENSOR (WIRE) ×3 IMPLANT
MANIFOLD NEPTUNE II (INSTRUMENTS) ×3 IMPLANT
PACK CYSTO (CUSTOM PROCEDURE TRAY) ×3 IMPLANT
STENT CONTOUR 6FRX24X.038 (STENTS) ×2 IMPLANT
TUBING CONNECTING 10 (TUBING) ×1 IMPLANT
TUBING CONNECTING 10' (TUBING) ×1

## 2016-07-27 NOTE — Op Note (Signed)
Operative diagnosis: Right ureteral stones Postoperative diagnosis: Right ureteral stones Surgery: Cystoscopy right retrograde ureterogram and insertion of right ureteral stent Surgeon: Dr. Lorin PicketScott Braydon Kullman  The patient has the above diagnoses and consented above procedure. She has 3 or 4 distal right ureteral stones. There was question that she might be starting to be infected  The patient was prepped and draped in usual fashion. A 23 [place] scope was utilized. The bladder mucosa and trigone were normal. Having said that the urine arguably was a little bit cloudy. I could see a stone almost crowning the right ureteral orifice.  Under cystoscopic and fluoroscopic guidance forcefully the sensor wire was easily passed up the ureter to the mid ureter. I then placed at 6 [place] open-ended ureteral catheter removing the wire. He did a gentle retrograde with 4 mL of contrast outline a mildly dilated right renal pelvis. The sensor wire was replaced curling in the renal pelvis and upper pole  Under cystoscopic and fluoroscopic guidance I passed a 24 cm x 6 French double-J stent without the string curling in the right renal pelvis and bladder. There was a moderate volcano affect possibly representing infection  The patient's bladder was emptied and she was sent to recovery. She'll be followed as outlined from the clinic

## 2016-07-27 NOTE — Transfer of Care (Signed)
Immediate Anesthesia Transfer of Care Note  Patient: Phyllis HarrisJennifer House  Procedure(s) Performed: Procedure(s): CYSTOSCOPY WITH RETROGRADE PYELOGRAM/URETERAL STENT PLACEMENT (Right)  Patient Location: PACU  Anesthesia Type:General  Level of Consciousness: awake, alert , oriented and patient cooperative  Airway & Oxygen Therapy: Patient Spontanous Breathing and Patient connected to face mask oxygen  Post-op Assessment: Report given to RN, Post -op Vital signs reviewed and stable and Patient moving all extremities X 4  Post vital signs: stable  Last Vitals:  Vitals:   07/27/16 1410 07/27/16 1737  BP: (!) 136/92 (P) 110/69  Pulse: 62 (P) 65  Resp: 16 (P) 14  Temp: 37.4 C (P) 36.9 C    Last Pain:  Vitals:   07/27/16 1436  TempSrc:   PainSc: 4       Patients Stated Pain Goal: 4 (07/27/16 1436)  Complications: No apparent anesthesia complications

## 2016-07-27 NOTE — Interval H&P Note (Signed)
History and Physical Interval Note:  07/27/2016 3:17 PM  [name]  has presented today for surgery, with the diagnosis of right ureteral calculi  The various methods of treatment have been discussed with the patient and family. After consideration of risks, benefits and other options for treatment, the patient has consented to  Procedure(s): CYSTOSCOPY WITH RETROGRADE PYELOGRAM/URETERAL STENT PLACEMENT (Right) as a surgical intervention .  The patient's history has been reviewed, patient examined, no change in status, stable for surgery.  I have reviewed the patient's chart and labs.  Questions were answered to the patient's satisfaction.     Posey Jasmin A

## 2016-07-27 NOTE — Anesthesia Preprocedure Evaluation (Signed)
Anesthesia Evaluation  Patient identified by MRN, date of birth, ID band Patient awake    Reviewed: Allergy & Precautions, H&P , NPO status , Patient's Chart, lab work & pertinent test results  Airway Mallampati: II  TM Distance: >3 FB Neck ROM: Full    Dental no notable dental hx. (+) Dental Advisory Given   Pulmonary neg pulmonary ROS,    Pulmonary exam normal breath sounds clear to auscultation       Cardiovascular negative cardio ROS Normal cardiovascular exam Rhythm:Regular Rate:Normal     Neuro/Psych  Headaches, PSYCHIATRIC DISORDERS Depression negative psych ROS   GI/Hepatic negative GI ROS, Neg liver ROS,   Endo/Other  negative endocrine ROS  Renal/GU negative Renal ROS     Musculoskeletal negative musculoskeletal ROS (+)   Abdominal   Peds  Hematology negative hematology ROS (+)   Anesthesia Other Findings   Reproductive/Obstetrics negative OB ROS                             Anesthesia Physical  Anesthesia Plan  ASA: I  Anesthesia Plan: General   Post-op Pain Management:    Induction: Intravenous  Airway Management Planned: LMA  Additional Equipment:   Intra-op Plan:   Post-operative Plan: Extubation in OR  Informed Consent: I have reviewed the patients History and Physical, chart, labs and discussed the procedure including the risks, benefits and alternatives for the proposed anesthesia with the patient or authorized representative who has indicated his/her understanding and acceptance.   Dental advisory given  Plan Discussed with: CRNA  Anesthesia Plan Comments:        Anesthesia Quick Evaluation

## 2016-07-27 NOTE — Anesthesia Procedure Notes (Signed)
Procedure Name: LMA Insertion Date/Time: 07/27/2016 4:51 PM Performed by: Illene SilverEVANS, Skii Cleland E Pre-anesthesia Checklist: Patient identified, Emergency Drugs available, Suction available and Patient being monitored Patient Re-evaluated:Patient Re-evaluated prior to inductionOxygen Delivery Method: Circle system utilized Preoxygenation: Pre-oxygenation with 100% oxygen Intubation Type: IV induction Ventilation: Mask ventilation without difficulty LMA: LMA inserted LMA Size: 4.0 Tube type: Oral Number of attempts: 1 Airway Equipment and Method: Oral airway Placement Confirmation: ETT inserted through vocal cords under direct vision,  positive ETCO2 and breath sounds checked- equal and bilateral Tube secured with: Tape Dental Injury: Teeth and Oropharynx as per pre-operative assessment

## 2016-07-27 NOTE — Discharge Instructions (Signed)
I have reviewed discharge instructions in detail with the patient. They will follow-up with me or their physician as scheduled. My nurse will also be calling the patients as per protocol.   

## 2016-07-27 NOTE — H&P (Signed)
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 Treated with potassium citrate. She's had a difficult time tolerating medication. H/o Hypocitraturia, hypercalciuria, hypokalemia with only extreme hypocitraturia on her last 24-hour urine in 2014. Prior serum evaluation with PTH, calcium, uric acid and electrolytes was normal.  She has known bilateral renal calculi burden. She states she woke up on Monday morning with RLQ abdominal pain. She was evaluated at the emergency department. Renal ultrasound showed right hydronephrosis. Today she presents with continued RLQ abdominal pain that radiates to her groin. She states that her pain is intermittent and tolerable at this time. She was having gross hematuria at the time, but states that this subsided a few days ago. She also complains of increased urinary urgency and frequency. Currently, she admits to general feelings of malaise, body aches, and chills. She reports fever yesterday, up to 100.3 via temporal measurement. Oral temperature of 99.6 today in the office. She has not taken any medication for her fever. She is on Percocet for pain control and her last dose of that was last night. She has not had any pain medication. She complains of having fairly significant nausea, with some associated vomiting. She last vomited yesterday morning. She has been able to tolerate PO intake of fluids fairly well.   ALLERGIES: No Allergies   MEDICATIONS: Triamcinolone Acetonide    GU PSH: Cysto Uretero Lithotripsy - 2014 Cystoscopy And Treatment - 2008 Cystoscopy Insert Stent - 2014, 2014     PSH Notes: Cystoscopy With Ureteroscopy With Lithotripsy, Cystoscopy With Insertion Of Ureteral Stent Left, Cystoscopy With Insertion Of Ureteral Stent Left, Cystoscopy With Manipulation Of Ureteral Calculus   NON-GU PSH: None   GU PMH: Kidney Stone, Nephrolithiasis - 04/27/2014 Mixed incontinence, Mixed stress and urge urinary incontinence -  04/03/2014 Urinary Tract Inf, Unspec site, Urinary tract infection - 2015, Pyuria, - 2015 Calculus Ureter, Ureteral Stone - 2014 Hydronephrosis Unspec, Hydronephrosis On The Right - 2014   NON-GU PMH: Encounter for general adult medical examination without abnormal findings, Encounter for preventive health examination - 04/27/2014 Hypokalemia, Hypokalemia - 2015 Personal history of other diseases of the nervous system and sense organs, History of migraine headaches - 2014 Personal history of other specified conditions, History of nausea - 2014 Unspecified disorder of calcium metabolism, Hypercalciuria - 2014   FAMILY HISTORY: Family Health Status Number - Runs In Family Father Deceased At 2ge86 ___ - Runs In Family nephrolithiasis - Uncle, Mother, Grandfather, Aunt   SOCIAL HISTORY: Marital Status: Single Current Smoking Status: Patient has never smoked.   Tobacco Use Assessment Completed:  Used Tobacco in last 30 days?  Has never drank.  Drinks 3 caffeinated drinks per day.    Notes: Never A Smoker, Drug Use, Caffeine Use, Marital History - Single, Alcohol Use   REVIEW OF SYSTEMS:    GU Review Female:   Patient reports frequent urination. Patient denies hard to postpone urination, burning /pain with urination, get up at night to urinate, leakage of urine, stream starts and stops, trouble starting your stream, have to strain to urinate, and currently pregnant.  Gastrointestinal (Upper):   Patient reports nausea and vomiting. Patient denies indigestion/ heartburn.  Gastrointestinal (Lower):   Patient denies diarrhea and constipation.  Constitutional:   Patient reports fever. Patient denies night sweats, weight loss, and fatigue.  Skin:   Patient denies skin rash/ lesion and itching.  Eyes:   Patient denies blurred vision and double vision.  Ears/ Nose/ Throat:   Patient denies  sore throat and sinus problems.  Hematologic/Lymphatic:   Patient denies swollen glands and easy bruising.   Cardiovascular:   Patient denies leg swelling and chest pains.  Respiratory:   Patient denies cough and shortness of breath.  Endocrine:   Patient denies excessive thirst.  Musculoskeletal:   Patient denies back pain and joint pain.  Neurological:   Patient denies headaches and dizziness.  Psychologic:   Patient denies depression and anxiety.   VITAL SIGNS:      07/27/2016 09:32 AM  Weight 178 lb / 80.74 kg  Height 63 in / 160.02 cm  BP 125/78 mmHg  Pulse 69 /min  Temperature 99.6 F / 38 C  BMI 31.5 kg/m   MULTI-SYSTEM PHYSICAL EXAMINATION:    Constitutional: Well-nourished. No physical deformities. Normally developed. Good grooming.   Respiratory: No labored breathing, no use of accessory muscles. Clear to auscultation bilaterally.   Cardiovascular: Normal temperature, no swelling, no varicosities. Regular rate and rhythm.   Skin: No paleness, no jaundice, no cyanosis. No lesion, no ulcer, no rash.  Neurologic / Psychiatric: Oriented to time, oriented to place, oriented to person. No depression, no anxiety, no agitation.  Gastrointestinal: RLQ Abdominal tenderness. No mass, no rigidity, non obese abdomen.   Musculoskeletal: Right CVA tenderness spine, ribs, pelvis. Normal gait and station of head and neck.    PAST DATA REVIEWED:  Source Of History:  Patient  Records Review:   Previous Patient Records  Urine Test Review:   Urinalysis  X-Ray Review: KUB: Reviewed Films.  Renal Ultrasound: Reviewed Films.  C.T. Stone Protocol: Reviewed Films.    PROCEDURES:         C.T. Urogram - [id number]             KUB - [id number]  A single view of the abdomen is obtained. Multiple renal calculi visualized within the confines/contours of bilateral renal shadows. There are no obvious opacities noted along the expected anatomical tracts of the left ureter. There are four opacities noted along the expected anatomical tract of the right ureter suspicious for ureteral calculi. Bladder appears free of  obstruction. Due to overlying bowel gas pattern, other small ureteral calculi can not be completely excluded. There are noted stable pelvic phleboiths. Bowel gas pattern appears normal. No boney abnormalities observed.               Urinalysis w/Scope Dipstick Dipstick Cont'd Micro  Color: Yellow Bilirubin: Neg WBC/hpf: 10 - 20/hpf  Appearance: Cloudy Ketones: Neg RBC/hpf: 40 - 60/hpf  Specific Gravity: 1.015 Blood: 3+ Bacteria: Mod (26-50/hpf)  pH: 7.5 Protein: Neg Cystals: NS (Not Seen)  Glucose: Neg Urobilinogen: 0.2 Casts: NS (Not Seen)    Nitrites: Neg Trichomonas: Present    Leukocyte Esterase: 1+ Mucous: Not Present      Epithelial Cells: 0 - 5/hpf      Yeast: NS (Not Seen)      Sperm: Not Present   ASSESSMENT:      ICD-10 Details  1 GU:   Calculus Ureter - N20.1   2   Kidney Stone - N20.0   3 NON-GU:   Bacteriuria - R82.71    PLAN:           Medications New Meds: Cipro 250 mg tablet 1 tablet PO Q 12 H   #14  0 Refill(s)  Percocet 5 mg-325 mg tablet 1 tablet PO Q 6 H PRN   #20  0 Refill(s)  Metronidazole 500 mg tablet 4 tablet PO Once   #  4  0 Refill(s)  Zofran Odt 8 mg tablet,disintegrating 1 tablet PO Q 8 H   #20  0 Refill(s)   Stop Meds: Ondansetron Hcl  Discontinue: 07/27/2016  - Reason: The medication cycle was completed.  Percocet 10 mg-325 mg tablet Oral  Start: 04/03/2014  Discontinue: 07/27/2016  - Reason: The medication cycle was completed.          Orders Labs Urine Culture  X-Rays: C.T. Stone Protocol Without Contrast    KUB         Schedule Return Visit/Planned Activity: ASAP - Schedule Surgery         Document Letter(s):  Created for Patient: Clinical Summary        Notes:   Urinalysis today is somewhat concerning and shows some pyuria and bacteriuria. I will send her urine for culture. Based on her history and symptoms I will proceed with KUB today. KUB shows multiple opacities along the expected anatomical tract of the right ureter suspicious for  ureteral calculi. Based on this, I will proceed with C.T. scan. C.T. imaging shows multiple ureteral calculi in the right ureter. There is mild right hydronephrosis noted. There is a significant stone burden in bilateral kidneys, as well. On call urologist was consulted based on her low grade fever in the office today and general feeling of malaise. He assessed the patient and discussed option with her. Patient elects to proceed with cystoscopy, right retrograde pyelogram, and right ureteral stent placement later today. The indications, risks, benefits including, but not limited to infection, pain, ureteral injury, inability to place stent, and need for additional procedures, and anesthetic complications were explained to the patient in great detail. She voiced understanding. I prescribed additional prescriptions for pain and nausea medication in the office today. I also prescribed Cipro for her to use for 7 days beginning after her procedure. We discussed indications and side effects of these medication. She did have Trichomonas noted on her UA today, so I will give her a one time dosage of Metronidazole for treatment of this. We discussed absolutely no alcohol use with the medication or for 24 hours after. She will follow up in one week with KUB. She may follow up sooner, if needed, with progressive symptoms.    After a thorough review of the management options for the patient's condition the patient  elected to proceed with surgical therapy as noted above. We have discussed the potential benefits and risks of the procedure, side effects of the proposed treatment, the likelihood of the patient achieving the goals of the procedure, and any potential problems that might occur during the procedure or recuperation. Informed consent has been obtained.

## 2016-07-27 NOTE — Anesthesia Postprocedure Evaluation (Signed)
Anesthesia Post Note  Patient: Phyllis House  Procedure(s) Performed: Procedure(s) (LRB): CYSTOSCOPY WITH RETROGRADE PYELOGRAM/URETERAL STENT PLACEMENT (Right)  Patient location during evaluation: PACU Anesthesia Type: General Level of consciousness: awake and alert Pain management: pain level controlled Vital Signs Assessment: post-procedure vital signs reviewed and stable Respiratory status: spontaneous breathing, nonlabored ventilation, respiratory function stable and patient connected to nasal cannula oxygen Cardiovascular status: blood pressure returned to baseline and stable Postop Assessment: no signs of nausea or vomiting Anesthetic complications: no       Last Vitals:  Vitals:   07/27/16 1756 07/27/16 1804  BP:  109/71  Pulse:    Resp: 14 14  Temp: 36.8 C 36.7 C    Last Pain:  Vitals:   07/27/16 1804  TempSrc:   PainSc: 0-No pain                 Phillips Groutarignan, [name]

## 2016-07-28 ENCOUNTER — Encounter (HOSPITAL_COMMUNITY): Payer: Self-pay | Admitting: Urology

## 2016-08-08 ENCOUNTER — Other Ambulatory Visit: Payer: Self-pay | Admitting: Urology

## 2016-08-08 ENCOUNTER — Encounter (HOSPITAL_BASED_OUTPATIENT_CLINIC_OR_DEPARTMENT_OTHER): Payer: Self-pay | Admitting: *Deleted

## 2016-08-09 ENCOUNTER — Encounter (HOSPITAL_BASED_OUTPATIENT_CLINIC_OR_DEPARTMENT_OTHER): Payer: Self-pay | Admitting: *Deleted

## 2016-08-10 ENCOUNTER — Encounter (HOSPITAL_BASED_OUTPATIENT_CLINIC_OR_DEPARTMENT_OTHER): Payer: Self-pay | Admitting: *Deleted

## 2016-08-10 NOTE — Anesthesia Preprocedure Evaluation (Addendum)
Anesthesia Evaluation  Patient identified by MRN, date of birth, ID band Patient awake    Reviewed: Allergy & Precautions, H&P , NPO status , Patient's Chart, lab work & pertinent test results  Airway Mallampati: II  TM Distance: >3 FB Neck ROM: Full    Dental no notable dental hx. (+) Dental Advisory Given, Teeth Intact   Pulmonary neg pulmonary ROS,    Pulmonary exam normal breath sounds clear to auscultation       Cardiovascular Exercise Tolerance: Good negative cardio ROS Normal cardiovascular exam Rhythm:Regular Rate:Normal     Neuro/Psych  Headaches, PSYCHIATRIC DISORDERS Depression negative psych ROS   GI/Hepatic negative GI ROS, Neg liver ROS,   Endo/Other  negative endocrine ROS  Renal/GU Renal diseasenegative Renal ROS     Musculoskeletal negative musculoskeletal ROS (+)   Abdominal   Peds  Hematology negative hematology ROS (+)   Anesthesia Other Findings   Reproductive/Obstetrics negative OB ROS                            Anesthesia Physical  Anesthesia Plan  ASA: I  Anesthesia Plan: General   Post-op Pain Management:    Induction: Intravenous  Airway Management Planned: LMA  Additional Equipment:   Intra-op Plan:   Post-operative Plan: Extubation in OR  Informed Consent: I have reviewed the patients History and Physical, chart, labs and discussed the procedure including the risks, benefits and alternatives for the proposed anesthesia with the patient or authorized representative who has indicated his/her understanding and acceptance.   Dental advisory given  Plan Discussed with: CRNA  Anesthesia Plan Comments:         Anesthesia Quick Evaluation

## 2016-08-10 NOTE — H&P (Signed)
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 Treated with potassium citrate. She's had a difficult time tolerating medication. H/o Hypocitraturia, hypercalciuria, hypokalemia with only extreme hypocitraturia on her last 24-hour urine in 2014. Prior serum evaluation with PTH, calcium, uric acid and electrolytes was normal.  She has known bilateral renal calculi burden. She presented on 3/29 with 3 day history of RLQ abdominal pain that radiated to her groin. She was evaluated at the emergency department and a renal ultrasound showed right hydronephrosis. In the office, she complianed of continue pain, chills, and mailaise. She had a low grade fever of 99.6 along with pyuria and bacteriuria. KUB and C.T imaging revealed multiple distal right ureteric stones, without significant hydroureter. She was taken to the OR later that day for urgent stent placement.   Today she presents for one week follow up s/p cystoscopy with right stent placement. She continues to do well. She denies any dysuria, fever, or suprapubic pain. She denies any significant nausea or vomiting. She does continue to have moderate right flank pain. She has been using Percocet, which does control her pain.     ALLERGIES: No Allergies    MEDICATIONS: Cipro 250 mg tablet 1 tablet PO Q 12 H  Percocet 5 mg-325 mg tablet 1 tablet PO Q 6 H PRN  Metronidazole 500 mg tablet 4 tablet PO Once  Triamcinolone Acetonide  Zofran Odt 8 mg tablet,disintegrating 1 tablet PO Q 8 H     GU PSH: Cysto Uretero Lithotripsy - 2014 Cystoscopy And Treatment - 2008 Cystoscopy Insert Stent - 2014, 2014      PSH Notes: Cystoscopy With Ureteroscopy With Lithotripsy, Cystoscopy With Insertion Of Ureteral Stent Left, Cystoscopy With Insertion Of Ureteral Stent Left, Cystoscopy With Manipulation Of Ureteral Calculus   NON-GU PSH: No Non-GU PSH    GU PMH: Kidney Stone, Nephrolithiasis - 04/27/2014 Mixed incontinence, Mixed  stress and urge urinary incontinence - 04/03/2014 Urinary Tract Inf, Unspec site, Urinary tract infection - 2015, Pyuria, - 2015 Calculus Ureter, Ureteral Stone - 2014 Hydronephrosis Unspec, Hydronephrosis On The Right - 2014    NON-GU PMH: Bacteriuria - 07/27/2016 Encounter for general adult medical examination without abnormal findings, Encounter for preventive health examination - 04/27/2014 Hypokalemia, Hypokalemia - 2015 Personal history of other diseases of the nervous system and sense organs, History of migraine headaches - 2014 Personal history of other specified conditions, History of nausea - 2014 Unspecified disorder of calcium metabolism, Hypercalciuria - 2014    FAMILY HISTORY: Family Health Status Number - Runs In Family Father Deceased At Age16 ___ - Runs In Family nephrolithiasis - Uncle, Mother, Grandfather, Aunt   SOCIAL HISTORY: Marital Status: Single Current Smoking Status: Patient has never smoked.   Tobacco Use Assessment Completed: Used Tobacco in last 30 days? Has never drank.  Drinks 3 caffeinated drinks per day.     Notes: Never A Smoker, Drug Use, Caffeine Use, Marital History - Single, Alcohol Use   REVIEW OF SYSTEMS:    GU Review Female:   Patient denies frequent urination, hard to postpone urination, burning /pain with urination, get up at night to urinate, leakage of urine, stream starts and stops, trouble starting your stream, have to strain to urinate, and currently pregnant.  Gastrointestinal (Upper):   Patient reports nausea. Patient denies vomiting and indigestion/ heartburn.  Gastrointestinal (Lower):   Patient denies diarrhea and constipation.  Constitutional:   Patient denies fever, night sweats, weight loss, and fatigue.  Skin:   Patient denies  skin rash/ lesion and itching.  Eyes:   Patient denies blurred vision and double vision.  Ears/ Nose/ Throat:   Patient denies sore throat and sinus problems.  Hematologic/Lymphatic:   Patient denies  swollen glands and easy bruising.  Cardiovascular:   Patient denies chest pains and leg swelling.  Respiratory:   Patient denies cough and shortness of breath.  Endocrine:   Patient denies excessive thirst.  Musculoskeletal:   Patient denies back pain and joint pain.  Neurological:   Patient denies headaches and dizziness.  Psychologic:   Patient denies depression and anxiety.   VITAL SIGNS:    Weight 178 lb / 80.74 kg  Height 63 in / 160.02 cm  BP 125/78 mmHg  Pulse 69 /min  Temperature 99.6 F / 38 C  BMI 31.5 kg/m    MULTI-SYSTEM PHYSICAL EXAMINATION:    Constitutional: Well-nourished. No physical deformities. Normally developed. Good grooming.  Respiratory: No labored breathing, no use of accessory muscles. CTA bilaterally.   Cardiovascular: Normal temperature, no swelling, no varicosities. Regular rate and rhythm.   Skin: No paleness, no jaundice, no cyanosis. No lesion, no ulcer, no rash.  Neurologic / Psychiatric: Oriented to time, oriented to place, oriented to person. No depression, no anxiety, no agitation.  Gastrointestinal: No mass, no tenderness, no rigidity, non obese abdomen.  Musculoskeletal: Mild right CVA tenderness spine, ribs, pelvis. Normal gait and station of head and neck.     PAST DATA REVIEWED:  Source Of History:  Patient  Records Review:   Previous Patient Records  Urine Test Review:   Urinalysis, Urine Culture  X-Ray Review: KUB: Reviewed Films.  C.T. Stone Protocol: Reviewed Films.     PROCEDURES:         KUB - [id number]  A single view of the abdomen is obtained. Multiple renal calculi visualized within the confines/contours of bilateral renal shadows. There are no obvious opacities noted along the expected anatomical tracts of the left ureter. Ureteral stent visualized on the right side. Superior coil appears to be in the kidney and inferior coil in the bladder. There are two opacities noted along the expected anatomical tract of the right ureter which  likely represent ureteral calculi. Bladder appears free of obstruction. Due to overlying bowel gas pattern, other small ureteral calculi can not be completely excluded. There are noted stable pelvic phleboiths. Bowel gas pattern appears normal. No boney abnormalities observed.                Urinalysis w/Scope Dipstick Dipstick Cont'd Micro  Color: Red Bilirubin: Neg WBC/hpf: 6 - 10/hpf  Appearance: Cloudy Ketones: Neg RBC/hpf: >60/hpf  Specific Gravity: 1.025 Blood: 3+ Bacteria: Few (10-25/hpf)  pH: 7.5 Protein: 3+ Cystals: NS (Not Seen)  Glucose: Neg Urobilinogen: 0.2 Casts: NS (Not Seen)    Nitrites: Neg Trichomonas: Not Present    Leukocyte Esterase: 1+ Mucous: Not Present      Epithelial Cells: 0 - 5/hpf      Yeast: NS (Not Seen)      Sperm: Not Present    ASSESSMENT:      ICD-10 Details  1 GU:   Calculus Right Ureter - N20.1   2   Kidney Stone - N20.0    PLAN:    UA today does not look suspicious for infection. I will send her urine for culture -> Neg. KUB shows that her right ureteral stent remains in good position. Based on imaging, she likely has 2 ureteral calculi still in her distal ureter.  She will most likely need right ureteroscopy with holmium laser lithotripsy. We discussed the indications, risks, and benefits of this procedure in great detail including: bleeding, infections, pain, ureteral injury, kidney injury, inability to perform procedure, need for additional procedure, and anesthetic complications. She voiced understanding.

## 2016-08-10 NOTE — Progress Notes (Signed)
NPO AFTER MN.  ARRIVE AT 1610.  CURRENT LAB RESULTS IN CHART AND EPIC.

## 2016-08-11 ENCOUNTER — Ambulatory Visit (HOSPITAL_COMMUNITY): Payer: BC Managed Care – PPO

## 2016-08-11 ENCOUNTER — Ambulatory Visit (HOSPITAL_BASED_OUTPATIENT_CLINIC_OR_DEPARTMENT_OTHER): Payer: BC Managed Care – PPO | Admitting: Anesthesiology

## 2016-08-11 ENCOUNTER — Encounter (HOSPITAL_BASED_OUTPATIENT_CLINIC_OR_DEPARTMENT_OTHER)
Admission: RE | Disposition: A | Discharge status: Home / Self Care | Payer: Self-pay | Source: Ambulatory Visit | Attending: Urology

## 2016-08-11 ENCOUNTER — Encounter (HOSPITAL_BASED_OUTPATIENT_CLINIC_OR_DEPARTMENT_OTHER): Payer: Self-pay | Admitting: *Deleted

## 2016-08-11 ENCOUNTER — Ambulatory Visit (HOSPITAL_BASED_OUTPATIENT_CLINIC_OR_DEPARTMENT_OTHER)
Admission: RE | Admit: 2016-08-11 | Discharge: 2016-08-11 | Disposition: A | Discharge status: Home / Self Care | Payer: BC Managed Care – PPO | Source: Ambulatory Visit | Attending: Urology | Admitting: Urology

## 2016-08-11 DIAGNOSIS — Z9889 Other specified postprocedural states: Secondary | ICD-10-CM | POA: Diagnosis not present

## 2016-08-11 DIAGNOSIS — G43909 Migraine, unspecified, not intractable, without status migrainosus: Secondary | ICD-10-CM | POA: Insufficient documentation

## 2016-08-11 DIAGNOSIS — Z841 Family history of disorders of kidney and ureter: Secondary | ICD-10-CM | POA: Diagnosis not present

## 2016-08-11 DIAGNOSIS — N201 Calculus of ureter: Secondary | ICD-10-CM | POA: Insufficient documentation

## 2016-08-11 DIAGNOSIS — Z87442 Personal history of urinary calculi: Secondary | ICD-10-CM | POA: Insufficient documentation

## 2016-08-11 DIAGNOSIS — Z79899 Other long term (current) drug therapy: Secondary | ICD-10-CM | POA: Insufficient documentation

## 2016-08-11 HISTORY — DX: Calculus of ureter: N20.1

## 2016-08-11 HISTORY — PX: HOLMIUM LASER APPLICATION: SHX5852

## 2016-08-11 HISTORY — DX: Calculus of kidney: N20.0

## 2016-08-11 HISTORY — PX: CYSTOSCOPY WITH RETROGRADE PYELOGRAM, URETEROSCOPY AND STENT PLACEMENT: SHX5789

## 2016-08-11 HISTORY — DX: Dermatitis, unspecified: L30.9

## 2016-08-11 LAB — POCT PREGNANCY, URINE: Preg Test, Ur: NEGATIVE

## 2016-08-11 SURGERY — CYSTOURETEROSCOPY, WITH RETROGRADE PYELOGRAM AND STENT INSERTION
Anesthesia: General | Laterality: Right

## 2016-08-11 MED ORDER — IOHEXOL 300 MG/ML  SOLN
INTRAMUSCULAR | Status: DC | PRN
  Administered 2016-08-11: 8 mL via URETHRAL

## 2016-08-11 MED ORDER — MIDAZOLAM HCL 2 MG/2ML IJ SOLN
INTRAMUSCULAR | Status: AC
  Filled 2016-08-11: qty 2

## 2016-08-11 MED ORDER — MIDAZOLAM HCL 5 MG/5ML IJ SOLN
INTRAMUSCULAR | Status: DC | PRN
  Administered 2016-08-11: 2 mg via INTRAVENOUS

## 2016-08-11 MED ORDER — HYDROCODONE-ACETAMINOPHEN 10-325 MG PO TABS: 1.0000 | ORAL_TABLET | ORAL | 0 refills | Status: DC | PRN

## 2016-08-11 MED ORDER — DEXAMETHASONE SODIUM PHOSPHATE 10 MG/ML IJ SOLN
INTRAMUSCULAR | Status: DC | PRN
  Administered 2016-08-11: 10 mg via INTRAVENOUS

## 2016-08-11 MED ORDER — PROPOFOL 500 MG/50ML IV EMUL
INTRAVENOUS | Status: AC
  Filled 2016-08-11: qty 50

## 2016-08-11 MED ORDER — PHENAZOPYRIDINE HCL 200 MG PO TABS: 200.0000 mg | ORAL_TABLET | Freq: Three times a day (TID) | ORAL | 0 refills | Status: DC | PRN

## 2016-08-11 MED ORDER — PHENAZOPYRIDINE HCL 200 MG PO TABS
200.0000 mg | ORAL_TABLET | Freq: Once | ORAL | Status: AC
  Administered 2016-08-11: 200 mg via ORAL
  Filled 2016-08-11: qty 1

## 2016-08-11 MED ORDER — CEFAZOLIN SODIUM-DEXTROSE 2-4 GM/100ML-% IV SOLN
2.0000 g | INTRAVENOUS | Status: AC
  Administered 2016-08-11: 2 g via INTRAVENOUS
  Filled 2016-08-11: qty 100

## 2016-08-11 MED ORDER — SODIUM CHLORIDE 0.9 % IJ SOLN
INTRAMUSCULAR | Status: AC
  Filled 2016-08-11: qty 10

## 2016-08-11 MED ORDER — HYDROMORPHONE HCL 1 MG/ML IJ SOLN
0.2500 mg | INTRAMUSCULAR | Status: DC | PRN
Start: 2016-08-11 — End: 2016-08-11
  Administered 2016-08-11: 0.5 mg via INTRAVENOUS
  Filled 2016-08-11: qty 0.5

## 2016-08-11 MED ORDER — PHENAZOPYRIDINE HCL 100 MG PO TABS
ORAL_TABLET | ORAL | Status: AC
  Filled 2016-08-11: qty 2

## 2016-08-11 MED ORDER — LACTATED RINGERS IV SOLN
INTRAVENOUS | Status: DC
  Administered 2016-08-11: 06:00:00 via INTRAVENOUS
  Filled 2016-08-11: qty 1000

## 2016-08-11 MED ORDER — FENTANYL CITRATE (PF) 100 MCG/2ML IJ SOLN
INTRAMUSCULAR | Status: DC | PRN
  Administered 2016-08-11: 25 ug via INTRAVENOUS
  Administered 2016-08-11: 50 ug via INTRAVENOUS
  Administered 2016-08-11: 25 ug via INTRAVENOUS

## 2016-08-11 MED ORDER — SODIUM CHLORIDE 0.9 % IR SOLN
Status: DC | PRN
  Administered 2016-08-11: 6000 mL

## 2016-08-11 MED ORDER — DEXAMETHASONE SODIUM PHOSPHATE 10 MG/ML IJ SOLN
INTRAMUSCULAR | Status: AC
  Filled 2016-08-11: qty 1

## 2016-08-11 MED ORDER — LIDOCAINE 2% (20 MG/ML) 5 ML SYRINGE
INTRAMUSCULAR | Status: AC
  Filled 2016-08-11: qty 5

## 2016-08-11 MED ORDER — ONDANSETRON HCL 4 MG/2ML IJ SOLN
INTRAMUSCULAR | Status: AC
  Filled 2016-08-11: qty 2

## 2016-08-11 MED ORDER — OXYCODONE HCL 5 MG/5ML PO SOLN
5.0000 mg | Freq: Once | ORAL | Status: DC | PRN
  Filled 2016-08-11: qty 5

## 2016-08-11 MED ORDER — LIDOCAINE 2% (20 MG/ML) 5 ML SYRINGE
INTRAMUSCULAR | Status: DC | PRN
  Administered 2016-08-11: 70 mg via INTRAVENOUS

## 2016-08-11 MED ORDER — MEPERIDINE HCL 25 MG/ML IJ SOLN
6.2500 mg | INTRAMUSCULAR | Status: DC | PRN
  Filled 2016-08-11: qty 1

## 2016-08-11 MED ORDER — HYDROMORPHONE HCL 1 MG/ML IJ SOLN
INTRAMUSCULAR | Status: AC
  Filled 2016-08-11: qty 0.5

## 2016-08-11 MED ORDER — FENTANYL CITRATE (PF) 100 MCG/2ML IJ SOLN
INTRAMUSCULAR | Status: AC
  Filled 2016-08-11: qty 4

## 2016-08-11 MED ORDER — CEFAZOLIN SODIUM-DEXTROSE 2-4 GM/100ML-% IV SOLN
INTRAVENOUS | Status: AC
  Filled 2016-08-11: qty 100

## 2016-08-11 MED ORDER — PROPOFOL 500 MG/50ML IV EMUL
INTRAVENOUS | Status: DC | PRN
  Administered 2016-08-11: 30 mL via INTRAVENOUS
  Administered 2016-08-11: 170 mL via INTRAVENOUS

## 2016-08-11 MED ORDER — OXYCODONE HCL 5 MG PO TABS
5.0000 mg | ORAL_TABLET | Freq: Once | ORAL | Status: DC | PRN
  Filled 2016-08-11: qty 1

## 2016-08-11 MED ORDER — PROMETHAZINE HCL 25 MG/ML IJ SOLN
6.2500 mg | INTRAMUSCULAR | Status: DC | PRN
  Filled 2016-08-11: qty 1

## 2016-08-11 SURGICAL SUPPLY — 37 items
BAG DRAIN URO-CYSTO SKYTR STRL (DRAIN) ×2 IMPLANT
BAG DRN UROCATH (DRAIN) ×1
BASKET DAKOTA 1.9FR 11X120 (BASKET) ×1 IMPLANT
BASKET LASER NITINOL 1.9FR (BASKET) IMPLANT
BASKET STNLS GEMINI 4WIRE 3FR (BASKET) IMPLANT
BASKET ZERO TIP NITINOL 2.4FR (BASKET) IMPLANT
BSKT STON RTRVL 120 1.9FR (BASKET)
BSKT STON RTRVL GEM 120X11 3FR (BASKET)
BSKT STON RTRVL ZERO TP 2.4FR (BASKET)
CATH INTERMIT  6FR 70CM (CATHETERS) ×1 IMPLANT
CATH URET 5FR 28IN CONE TIP (BALLOONS)
CATH URET 5FR 70CM CONE TIP (BALLOONS) IMPLANT
CLOTH BEACON ORANGE TIMEOUT ST (SAFETY) ×2 IMPLANT
ELECT REM PT RETURN 9FT ADLT (ELECTROSURGICAL)
ELECTRODE REM PT RTRN 9FT ADLT (ELECTROSURGICAL) IMPLANT
FIBER LASER FLEXIVA 200 (UROLOGICAL SUPPLIES) ×1 IMPLANT
GLOVE BIO SURGEON STRL SZ8 (GLOVE) ×2 IMPLANT
GOWN STRL REUS W/ TWL LRG LVL3 (GOWN DISPOSABLE) ×1 IMPLANT
GOWN STRL REUS W/ TWL XL LVL3 (GOWN DISPOSABLE) ×1 IMPLANT
GOWN STRL REUS W/TWL LRG LVL3 (GOWN DISPOSABLE) ×2
GOWN STRL REUS W/TWL XL LVL3 (GOWN DISPOSABLE) ×2
GUIDEWIRE 0.038 PTFE COATED (WIRE) IMPLANT
GUIDEWIRE ANG ZIPWIRE 038X150 (WIRE) IMPLANT
GUIDEWIRE STR DUAL SENSOR (WIRE) ×3 IMPLANT
IV NS IRRIG 3000ML ARTHROMATIC (IV SOLUTION) ×4 IMPLANT
KIT BALLIN UROMAX 15FX10 (LABEL) IMPLANT
KIT BALLN UROMAX 15FX4 (MISCELLANEOUS) IMPLANT
KIT BALLN UROMAX 26 75X4 (MISCELLANEOUS)
KIT RM TURNOVER CYSTO AR (KITS) ×2 IMPLANT
MANIFOLD NEPTUNE II (INSTRUMENTS) IMPLANT
PACK CYSTO (CUSTOM PROCEDURE TRAY) ×2 IMPLANT
SET HIGH PRES BAL DIL (LABEL)
SHEATH ACCESS URETERAL 38CM (SHEATH) IMPLANT
STENT POLARIS 5FRX24 (STENTS) ×1 IMPLANT
SYRINGE IRR TOOMEY STRL 70CC (SYRINGE) ×1 IMPLANT
TUBE CONNECTING 12X1/4 (SUCTIONS) ×1 IMPLANT
WATER STERILE IRR 3000ML UROMA (IV SOLUTION) IMPLANT

## 2016-08-11 NOTE — Anesthesia Postprocedure Evaluation (Signed)
Anesthesia Post Note  Patient: Phyllis House  Procedure(s) Performed: Procedure(s) (LRB): CYSTOSCOPY WITH RIGHT RETROGRADE PYELOGRAM,RIGHT  URETEROSCOPY AND STENT CHANGE (Right) HOLMIUM LASER APPLICATION (Right)  Patient location during evaluation: PACU Anesthesia Type: General Level of consciousness: sedated and patient cooperative Pain management: pain level controlled Vital Signs Assessment: post-procedure vital signs reviewed and stable Respiratory status: spontaneous breathing Cardiovascular status: stable Anesthetic complications: no       Last Vitals:  Vitals:   08/11/16 0939 08/11/16 1000  BP:  119/76  Pulse: (!) 57 62  Resp: 13 12  Temp:  36.7 C    Last Pain:  Vitals:   08/11/16 1000  TempSrc:   PainSc: 0-No pain                 Lewie Loron

## 2016-08-11 NOTE — Op Note (Signed)
PATIENT:  Phyllis House  PRE-OPERATIVE DIAGNOSIS:  right Ureteral calcului (4)  POST-OPERATIVE DIAGNOSIS: Same  PROCEDURE:  1. Cystoscopy with removal of right ureteral stent. 2.  Right retrograde pyelogram with interpretation. 3. Right ureteroscopy with extraction of 3 stones 4. Right ureteroscopy with laser lithotripsy of a fourth stone. 5. Right ureteral stent placement 6. Fluoroscopy time less than 1 hour  SURGEON: Garnett Farm, MD  INDICATION: Phyllis House is a 41 year old female with a known history of stone disease. She presented to the hospital recently with flank pain and her urine appeared to be possibly infected therefore she underwent stent placement. She returns today for removal of her stones and indicates she has passed some stones but there appeared to be 4 stones still present within the ureter on her KUB. 2 were easily visible in the deep pelvis and there appeared to be one or possibly 2 over the sacrum. We discussed the procedure today and she has elected to proceed.  ANESTHESIA:  General  EBL:  Minimal  DRAINS: 5 French, 24 cm Polaris stent (with string, tucked in the vagina)  SPECIMEN:  Stone given to patient  DESCRIPTION OF PROCEDURE: The patient was taken to the major OR and placed on the table. General anesthesia was administered and then the patient was moved to the dorsal lithotomy position. The genitalia was sterilely prepped and draped. An official timeout was performed.  Initially the 23 French cystoscope with 30 lens was passed under direct vision into the bladder. The bladder was then fully inspected. It was noted be free of any tumors, stones or inflammatory lesions. Ureteral orifices were of normal configuration and position. I noted a stent protruding from the right ureteral orifice. This was grasped with the alligator forceps and a 0.038 inch sensor-tip guidewire was then passed through the lumen of the stent after it had been withdrawn through the  urethral meatus. The guidewire was positioned in the area the renal pelvis and the stent was removed.  I then introduced the 6 French rigid ureteroscope and passed this under direct vision into the right ureteral orifice. A retrograde pyelogram was performed by injecting full-strength contrast up the right ureter under direct fluoroscopic control through the ureteroscope. It revealed a filling defects in the distal and mid ureter consistent with the stones seen on the preoperative KUB. The remainder of the ureter was noted to be normal as was the intrarenal collecting system.  The first, most distal stone was identified and was felt to be of a size and configuration that it could be extracted with the basket and therefore a [place] basket was passed through the ureteroscope and the stone was engaged and was easily removed from the ureter which had dilated due to the presence of the stent. I then proceeded to remove to more distal ureteral stones in an identical fashion. I then passed the ureteroscope on up the ureter and found the fourth stone which was fairly large. I engaged in the basket and withdrew it down into the distal ureter and disengage the stone from the basket.   I then proceeded with laser lithotripsy. The 200  holmium laser fiber was used to fragment the stone. I then used the [place] basket to extract all of the stone fragments and reinspection of the ureter ureteroscopically revealed no further stone fragments and no injury to the ureter. The guidewire, which I had previously removed, was replaced by passing this through the ureteroscope into the area the renal pelvis and then removing  the ureteroscope leaving the guidewire in place. I then backloaded the cystoscope over the guidewire and passed the stent over the guidewire into the area of the renal pelvis. As the guidewire was removed good curl was noted in the renal pelvis. The bladder was drained and the cystoscope was then removed. The  patient tolerated the procedure well no intraoperative complications.  PLAN OF CARE: Discharge to home after PACU  PATIENT DISPOSITION:  PACU - hemodynamically stable.

## 2016-08-11 NOTE — Anesthesia Procedure Notes (Signed)
Procedure Name: LMA Insertion Date/Time: 08/11/2016 7:40 AM Performed by: Tyrone Nine Pre-anesthesia Checklist: Patient identified, Timeout performed, Emergency Drugs available, Suction available and Patient being monitored Patient Re-evaluated:Patient Re-evaluated prior to inductionOxygen Delivery Method: Circle system utilized Preoxygenation: Pre-oxygenation with 100% oxygen Intubation Type: IV induction Ventilation: Mask ventilation without difficulty LMA: LMA inserted LMA Size: 4.0 Number of attempts: 1 Placement Confirmation: breath sounds checked- equal and bilateral and positive ETCO2 Tube secured with: Tape Dental Injury: Teeth and Oropharynx as per pre-operative assessment

## 2016-08-11 NOTE — Discharge Instructions (Signed)

## 2016-08-11 NOTE — Transfer of Care (Signed)
Immediate Anesthesia Transfer of Care Note  Patient: Phyllis House  Procedure(s) Performed: Procedure(s): CYSTOSCOPY WITH RIGHT RETROGRADE PYELOGRAM,RIGHT  URETEROSCOPY AND STENT CHANGE (Right) HOLMIUM LASER APPLICATION (Right)  Patient Location: PACU  Anesthesia Type:General  Level of Consciousness: awake, alert , oriented and patient cooperative  Airway & Oxygen Therapy: Patient Spontanous Breathing and Patient connected to nasal cannula oxygen  Post-op Assessment: Report given to RN and Post -op Vital signs reviewed and stable  Post vital signs: Reviewed and stable  Last Vitals:  Vitals:   08/11/16 0606  BP: 108/71  Pulse: 60  Resp: 17  Temp: 36.9 C    Last Pain:  Vitals:   08/11/16 0606  TempSrc: Oral  PainSc: 3       Patients Stated Pain Goal: 4 (08/11/16 0606)  Complications: No apparent anesthesia complications

## 2016-08-14 ENCOUNTER — Encounter (HOSPITAL_BASED_OUTPATIENT_CLINIC_OR_DEPARTMENT_OTHER): Payer: Self-pay | Admitting: Urology

## 2018-01-03 ENCOUNTER — Other Ambulatory Visit: Payer: Self-pay

## 2018-01-03 ENCOUNTER — Encounter (HOSPITAL_COMMUNITY): Payer: Self-pay

## 2018-01-03 ENCOUNTER — Emergency Department (HOSPITAL_COMMUNITY)
Admission: EM | Admit: 2018-01-03 | Discharge: 2018-01-04 | Disposition: A | Discharge status: Home / Self Care | Payer: BC Managed Care – PPO | Attending: Emergency Medicine | Admitting: Emergency Medicine

## 2018-01-03 ENCOUNTER — Emergency Department (HOSPITAL_COMMUNITY): Payer: BC Managed Care – PPO

## 2018-01-03 DIAGNOSIS — Z87442 Personal history of urinary calculi: Secondary | ICD-10-CM | POA: Insufficient documentation

## 2018-01-03 DIAGNOSIS — Z79899 Other long term (current) drug therapy: Secondary | ICD-10-CM | POA: Insufficient documentation

## 2018-01-03 DIAGNOSIS — N390 Urinary tract infection, site not specified: Secondary | ICD-10-CM | POA: Diagnosis not present

## 2018-01-03 DIAGNOSIS — R319 Hematuria, unspecified: Secondary | ICD-10-CM | POA: Diagnosis present

## 2018-01-03 DIAGNOSIS — N132 Hydronephrosis with renal and ureteral calculous obstruction: Secondary | ICD-10-CM | POA: Diagnosis not present

## 2018-01-03 DIAGNOSIS — N201 Calculus of ureter: Secondary | ICD-10-CM

## 2018-01-03 LAB — URINALYSIS, ROUTINE W REFLEX MICROSCOPIC
BILIRUBIN URINE: NEGATIVE
Glucose, UA: NEGATIVE mg/dL
KETONES UR: NEGATIVE mg/dL
Nitrite: NEGATIVE
Protein, ur: NEGATIVE mg/dL
SPECIFIC GRAVITY, URINE: 1.014 (ref 1.005–1.030)
pH: 7 (ref 5.0–8.0)

## 2018-01-03 LAB — CBC WITH DIFFERENTIAL/PLATELET
Basophils Absolute: 0.1 10*3/uL (ref 0.0–0.1)
Basophils Relative: 1 %
Eosinophils Absolute: 0.1 10*3/uL (ref 0.0–0.7)
Eosinophils Relative: 2 %
HEMATOCRIT: 33.3 % — AB (ref 36.0–46.0)
Hemoglobin: 11.4 g/dL — ABNORMAL LOW (ref 12.0–15.0)
LYMPHS PCT: 37 %
Lymphs Abs: 1.9 10*3/uL (ref 0.7–4.0)
MCH: 27.4 pg (ref 26.0–34.0)
MCHC: 34.2 g/dL (ref 30.0–36.0)
MCV: 80 fL (ref 78.0–100.0)
MONOS PCT: 6 %
Monocytes Absolute: 0.3 10*3/uL (ref 0.1–1.0)
NEUTROS ABS: 2.7 10*3/uL (ref 1.7–7.7)
Neutrophils Relative %: 54 %
Platelets: 157 10*3/uL (ref 150–400)
RBC: 4.16 MIL/uL (ref 3.87–5.11)
RDW: 14.1 % (ref 11.5–15.5)
WBC: 5.1 10*3/uL (ref 4.0–10.5)

## 2018-01-03 LAB — COMPREHENSIVE METABOLIC PANEL
ALT: 14 U/L (ref 0–44)
ANION GAP: 7 (ref 5–15)
AST: 15 U/L (ref 15–41)
Albumin: 3.9 g/dL (ref 3.5–5.0)
Alkaline Phosphatase: 42 U/L (ref 38–126)
BUN: 16 mg/dL (ref 6–20)
CHLORIDE: 110 mmol/L (ref 98–111)
CO2: 23 mmol/L (ref 22–32)
Calcium: 8.9 mg/dL (ref 8.9–10.3)
Creatinine, Ser: 0.91 mg/dL (ref 0.44–1.00)
GFR calc Af Amer: >60 mL/min (ref >60)
Glucose, Bld: 111 mg/dL — ABNORMAL HIGH (ref 70–99)
POTASSIUM: 3.6 mmol/L (ref 3.5–5.1)
Sodium: 140 mmol/L (ref 135–145)
TOTAL PROTEIN: 6.6 g/dL (ref 6.5–8.1)
Total Bilirubin: 0.5 mg/dL (ref 0.3–1.2)

## 2018-01-03 LAB — POC URINE PREG, ED: Preg Test, Ur: NEGATIVE

## 2018-01-03 LAB — LIPASE, BLOOD: LIPASE: 47 U/L (ref 11–51)

## 2018-01-03 LAB — I-STAT BETA HCG BLOOD, ED (MC, WL, AP ONLY): I-stat hCG, quantitative: <5 m[IU]/mL (ref <5)

## 2018-01-03 MED ORDER — OXYCODONE-ACETAMINOPHEN 5-325 MG PO TABS
1.0000 | ORAL_TABLET | Freq: Once | ORAL | Status: AC
  Administered 2018-01-03: 1 via ORAL
  Filled 2018-01-03: qty 1

## 2018-01-03 MED ORDER — TAMSULOSIN HCL 0.4 MG PO CAPS: 0.4000 mg | ORAL_CAPSULE | Freq: Every day | ORAL | 0 refills | Status: AC

## 2018-01-03 MED ORDER — ONDANSETRON HCL 4 MG/2ML IJ SOLN
4.0000 mg | Freq: Once | INTRAMUSCULAR | Status: DC
  Filled 2018-01-03: qty 2

## 2018-01-03 MED ORDER — KETOROLAC TROMETHAMINE 15 MG/ML IJ SOLN
15.0000 mg | Freq: Once | INTRAMUSCULAR | Status: AC
  Administered 2018-01-03: 15 mg via INTRAMUSCULAR
  Filled 2018-01-03: qty 1

## 2018-01-03 MED ORDER — ONDANSETRON HCL 4 MG PO TABS: 4.0000 mg | ORAL_TABLET | Freq: Three times a day (TID) | ORAL | 0 refills | Status: DC | PRN

## 2018-01-03 MED ORDER — CEPHALEXIN 500 MG PO CAPS: 500.0000 mg | ORAL_CAPSULE | Freq: Two times a day (BID) | ORAL | 0 refills | Status: AC

## 2018-01-03 MED ORDER — HYDROCODONE-ACETAMINOPHEN 5-325 MG PO TABS: 2.0000 | ORAL_TABLET | Freq: Four times a day (QID) | ORAL | 0 refills | Status: AC | PRN

## 2018-01-03 MED ORDER — ONDANSETRON 4 MG PO TBDP
4.0000 mg | ORAL_TABLET | Freq: Once | ORAL | Status: AC
  Administered 2018-01-03: 4 mg via ORAL
  Filled 2018-01-03: qty 1

## 2018-01-03 NOTE — ED Triage Notes (Signed)
Patient c/o right groin pain x 2 days and hematuria x 10 days. Patient states she has a history of kidney stones.

## 2018-01-03 NOTE — ED Notes (Signed)
Pt declined discharge vitals. A&Ox4. Ambulatory. NAD.

## 2018-01-03 NOTE — Discharge Instructions (Addendum)
You were given a prescription for antibiotics. Please take the antibiotic prescription fully.   Today you were diagnosed with a kidney stone on your CT scan.  You will be given a prescription for Flomax, pain medication, and nausea medication.  You should not drive, work, or operate machinery while taking the pain medication as it can make you very drowsy.  You will need to follow-up with urology for reevaluation and for further treatment of your kidney stone.  You will need to return to the emergency department for any fevers, persistent pain, persistent vomiting, inability to urinate, or any new or worsening symptoms.  You will also need to follow-up with your OB/GYN in regards to your IUD as will need a pelvic ultrasound to make sure that your IUD is in the correct place.

## 2018-01-03 NOTE — ED Notes (Signed)
Pt able to tolerate PO without vomiting. 

## 2018-01-03 NOTE — ED Provider Notes (Signed)
[facility]-EMERGENCY DEPT Provider Note   CSN: [id number] Arrival date & time: 01/03/18  1834     History   Chief Complaint Chief Complaint  Patient presents with  . Groin Pain  . Hematuria    HPI Phyllis House is a 42 y.o. female.  HPI  Patient is a 42 year old female with history of nephrolithiasis who presents to the emergency department today for evaluation of right groin pain and right flank pain that began 2 days ago.  Patient reports that she has had hematuria for  starting 10 days ago that was painless. This has since resolved, however she later developed the pain that she has currently.  Rates pain as severe and constant in nature.  Pain is temporarily relieved by taking Motrin at home.  States pain feels consistent with prior kidney stones.  States that she has passed "at least 60 "kidney stones in her lifetime.  She also reports dysuria, but denies frequency or urgency.  Reports nausea and vomiting but no fevers or chills.  No diarrhea constipation or other symptoms at this time.  States she tried to contact the urology office and was unable to get an appointment today and was told to come to the ED for evaluation.  Past Medical History:  Diagnosis Date  . Eczema   . H/O varicella   . History of herpes genitalis    LAST BREAK OUT 6 YRS AGO  . History of kidney stones   . Migraine   . Nephrolithiasis    bilateral-- per last renal u/s 07-27-2016  . Polycystic ovarian syndrome   . Right ureteral stone     Patient Active Problem List   Diagnosis Date Noted  . Migraine without aura and without status migrainosus, not intractable 11/19/2013  . Other malaise and fatigue 03/14/2013  . Hypokalemia 12/26/2012  . Infection of urinary tract 07/02/2012  . Ureteral calculus, left 07/01/2012  . Fever, unspecified 07/01/2012    Past Surgical History:  Procedure Laterality Date  . CYSTOSCOPY W/ URETERAL STENT PLACEMENT Left 07/01/2012   Procedure:  CYSTOSCOPY WITH RETROGRADE PYELOGRAM/URETERAL STENT PLACEMENT;  Surgeon: Milford Cage, MD;  Location: [facility];  Service: Urology;  Laterality: Left;  . CYSTOSCOPY W/ URETERAL STENT PLACEMENT Right 07/27/2016   Procedure: CYSTOSCOPY WITH RETROGRADE PYELOGRAM/URETERAL STENT PLACEMENT;  Surgeon: Alfredo Martinez, MD;  Location: [facility];  Service: Urology;  Laterality: Right;  . CYSTOSCOPY WITH RETROGRADE PYELOGRAM, URETEROSCOPY AND STENT PLACEMENT Left 07/15/2012   Procedure: CYSTOSCOPY WITH left RETROGRADE PYELOGRAM,left  URETEROSCOPY AND left STENT exchange ;  Surgeon: Milford Cage, MD;  Location: [facility];  Service: Urology;  Laterality: Left;  LEFT URETER STENT EXCHANGE    . CYSTOSCOPY WITH RETROGRADE PYELOGRAM, URETEROSCOPY AND STENT PLACEMENT Right 08/11/2016   Procedure: CYSTOSCOPY WITH RIGHT RETROGRADE PYELOGRAM,RIGHT  URETEROSCOPY AND STENT CHANGE;  Surgeon: Ihor Gully, MD;  Location: [facility];  Service: Urology;  Laterality: Right;  . CYSTOSCOPY/RETROGRADE/URETEROSCOPY/STONE EXTRACTION WITH BASKET  AGE 26  . HOLMIUM LASER APPLICATION Left 07/15/2012   Procedure: HOLMIUM LASER APPLICATION;  Surgeon: Milford Cage, MD;  Location: [facility];  Service: Urology;  Laterality: Left;  . HOLMIUM LASER APPLICATION Right 08/11/2016   Procedure: HOLMIUM LASER APPLICATION;  Surgeon: Ihor Gully, MD;  Location[facility];  Service: Urology;  Laterality: Right;  . TONSILLECTOMY  AS CHILD     OB History    Gravida  2   Para  2  Term  2   Preterm      AB      Living  2     SAB      TAB      Ectopic      Multiple      Live Births  2            Home Medications    Prior to Admission medications   Medication Sig Start Date End Date Taking? Authorizing Provider  Aspirin-Salicylamide-Caffeine (BC HEADACHE PO) Take 1 packet by mouth daily as needed (pain).   Yes [provider]    ibuprofen (ADVIL,MOTRIN) 200 MG tablet Take 400 mg by mouth every 8 (eight) hours as needed for pain.   Yes [provider]  levonorgestrel (MIRENA) 20 MCG/24HR IUD 1 each by Intrauterine route once. February 2014   Yes [provider]  cephALEXin (KEFLEX) 500 MG capsule Take 1 capsule (500 mg total) by mouth 2 (two) times daily for 7 days. 01/03/18 01/10/18  Juleah Paradise S, PA-C  HYDROcodone-acetaminophen (NORCO/VICODIN) 5-325 MG tablet Take 2 tablets by mouth every 6 (six) hours as needed for up to 8 days. 01/03/18 01/11/18  Raidon Swanner S, PA-C  ondansetron (ZOFRAN) 4 MG tablet Take 1 tablet (4 mg total) by mouth every 8 (eight) hours as needed for nausea or vomiting. 01/03/18   Wyoma Genson S, PA-C  phenazopyridine (PYRIDIUM) 200 MG tablet Take 1 tablet (200 mg total) by mouth 3 (three) times daily as needed for pain. Patient not taking: Reported on 01/03/2018 08/11/16   Ihor Gully, MD  tamsulosin (FLOMAX) 0.4 MG CAPS capsule Take 1 capsule (0.4 mg total) by mouth daily for 14 days. 01/03/18 01/17/18  Koda Defrank S, PA-C    Family History Family History  Problem Relation Age of Onset  . Thyroid disease Mother   . Depression Mother   . Cancer Father        leaukemia  . Thyroid disease Brother   . Diabetes Maternal Aunt   . Cancer Maternal Grandmother        GI  . Diabetes Maternal Grandfather   . Heart disease Paternal Grandmother     Social History Social History   Tobacco Use  . Smoking status: Never Smoker  . Smokeless tobacco: Never Used  Substance Use Topics  . Alcohol use: No  . Drug use: No     Allergies   Adhesive [tape]   Review of Systems Review of Systems  Constitutional: Negative for chills and fever.  HENT: Negative for ear pain and sore throat.   Eyes: Negative for visual disturbance.  Respiratory: Negative for cough and shortness of breath.   Cardiovascular: Negative for chest pain.  Gastrointestinal: Positive for abdominal pain,  nausea and vomiting. Negative for constipation and diarrhea.  Genitourinary: Positive for dysuria, flank pain and hematuria. Negative for frequency and urgency.  Musculoskeletal: Negative for back pain.  Skin: Negative for rash.  Neurological: Negative for headaches.  All other systems reviewed and are negative.  Physical Exam Updated Vital Signs BP 139/87 (BP Location: Right Arm)   Pulse 85   Temp 98.5 F (36.9 C) (Oral)   Resp 14   Ht 5\' 3"  (1.6 m)   Wt 72.6 kg   LMP 06/05/2017   SpO2 100%   BMI 28.34 kg/m   Physical Exam  Constitutional: She appears well-developed and well-nourished. She appears distressed.  Patient standing up in the room and is pacing, states pain feels better when  she walks around.  HENT:  Head: Normocephalic and atraumatic.  Eyes: Conjunctivae are normal.  Neck: Neck supple.  Cardiovascular: Normal rate, regular rhythm and normal heart sounds.  No murmur heard. Pulmonary/Chest: Effort normal and breath sounds normal. No respiratory distress. She has no wheezes.  Abdominal: Soft. Bowel sounds are normal. There is tenderness.  Right lower quadrant and right CVA tenderness.  No rebound guarding or rigidity.  Musculoskeletal: She exhibits no edema.  Neurological: She is alert.  Skin: Skin is warm and dry. She is not diaphoretic.  Psychiatric: She has a normal mood and affect.  Nursing note and vitals reviewed.   ED Treatments / Results  Labs (all labs ordered are listed, but only abnormal results are displayed) Labs Reviewed  URINALYSIS, ROUTINE W REFLEX MICROSCOPIC - Abnormal; Notable for the following components:      Result Value   Hgb urine dipstick LARGE (*)    Leukocytes, UA TRACE (*)    RBC / HPF >50 (*)    Bacteria, UA RARE (*)    Crystals PRESENT (*)    All other components within normal limits  CBC WITH DIFFERENTIAL/PLATELET - Abnormal; Notable for the following components:   Hemoglobin 11.4 (*)    HCT 33.3 (*)    All other  components within normal limits  COMPREHENSIVE METABOLIC PANEL - Abnormal; Notable for the following components:   Glucose, Bld 111 (*)    All other components within normal limits  URINE CULTURE  LIPASE, BLOOD  POC URINE PREG, ED  I-STAT BETA HCG BLOOD, ED ([facility], [facility], [facility] ONLY)    EKG None  Radiology Ct Renal Stone Study  Result Date: 01/03/2018 CLINICAL DATA:  RIGHT groin pain for 2 days. History of kidney stones and multiple cystoscopies. EXAM: CT ABDOMEN AND PELVIS WITHOUT CONTRAST TECHNIQUE: Multidetector CT imaging of the abdomen and pelvis was performed following the standard protocol without IV contrast. COMPARISON:  CT of the abdomen and pelvis on 07/27/2016 FINDINGS: Lower chest: No acute abnormality. Hepatobiliary: No focal liver abnormality is seen. No radiopaque gallstones, biliary dilatation, or pericholecystic inflammatory changes. Pancreas: Unremarkable. No pancreatic ductal dilatation or surrounding inflammatory changes. Spleen: Normal in size without focal abnormality. Adrenals/Urinary Tract: Adrenal glands are normal in appearance. There is moderate RIGHT hydronephrosis and hydroureter secondary to 2 stones in the distal ureter, measuring 5 millimeters and 8 millimeters. Stones are approximately 5 centimeters proximal to the RIGHT ureterovesical junction. Numerous coarse calcifications are identified in the kidneys bilaterally, similar in appearance to the previous exam and consistent with medullary calcinosis and/or collecting system stones. No hydronephrosis on the LEFT. LEFT ureter is unremarkable. The bladder and visualized portion of the urethra are normal. Stomach/Bowel: The stomach and small bowel loops are normal in appearance. Loops of colon are normal in appearance. The appendix is well seen and has a normal appearance. Vascular/Lymphatic: No significant vascular findings are present. No enlarged abdominal or pelvic lymph nodes. Reproductive: The uterus is present and contains  intrauterine device. The LATERAL limbs of the IUD are likely within the mid uterus, slightly low in the endometrial canal compared to prior study from 2015. Consider follow-up pelvic ultrasound to evaluate location of the IUD. No adnexal mass. Other: No free pelvic fluid. Anterior abdominal wall is unremarkable. Musculoskeletal: No acute or significant osseous findings. IMPRESSION: 1. Moderate RIGHT hydronephrosis and hydroureter secondary to 2 stones in the distal RIGHT ureter, measuring 5 and 8 millimeters. 2. 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 3. Intrauterine device appears slightly  low within the endometrial canal. It is possible that the LATERAL limbs are within the myometrium. Recommend follow-up pelvic ultrasound for better characterization. Electronically Signed   By: Norva Pavlov M.D.   On: 01/03/2018 23:14    Procedures Procedures (including critical care time)  Medications Ordered in ED Medications  oxyCODONE-acetaminophen (PERCOCET/ROXICET) 5-325 MG per tablet 1 tablet (has no administration in time range)  ketorolac (TORADOL) 15 MG/ML injection 15 mg (15 mg Intramuscular Given 01/03/18 2144)  ondansetron (ZOFRAN-ODT) disintegrating tablet 4 mg (4 mg Oral Given 01/03/18 2207)     Initial Impression / Assessment and Plan / ED Course  I have reviewed the triage vital signs and the nursing notes.  Pertinent labs & imaging results that were available during my care of the patient were reviewed by me and considered in my medical decision making (see chart for details).  CONSULT with Dr. Mena Goes with urology who agrees with the plan to start patient on antibiotics and have her follow-up in the office tomorrow.  Final Clinical Impressions(s) / ED Diagnoses   Final diagnoses:  Ureteral stone  Urinary tract infection with hematuria, site unspecified   Patient with recurrent history of nephrolithiasis presenting the ED with right groin and right flank pain  that began 2 days ago.  Feels consistent with prior ureteral stones.  Afebrile with normal vital signs.  Initially had nausea and vomiting.  Nausea has improved with administration of ODT Zofran and pain is improved with IM Toradol in the ED.  CBC is without leukocytosis, she does have a mild anemia at 11.4.  CMP with normal kidney function.  Lipase negative.  Beta hCG negative.  UA with leukocytosis, hematuria, greater than 50 RBCs, 6-10 white blood cells, rare bacteria, and crystals are present.  CT renal study shows a 5 mm and 8 mm stone to the right ureter with associated right hydronephrosis and hydroureter.  IUD also appears slightly low in endometrial canal with possible lateral limbs in the myometrium.  Pelvic ultrasound was recommended for follow-up.  Patient able to tolerate p.o. in the ED. Sxs well controlled with Toradol and Zofran.  Discussed findings of lab work and imaging studies.  Advised her to follow-up with her urologist tomorrow morning as already scheduled.  Gave medications for her symptoms as well as antibiotics to treat a potential urinary tract infection.  Less likely infected stone given no fevers, no persistent nausea or vomiting, and no intractable pain.  Case discussed with urology who agrees with plan.  Advised patient to follow-up with her OB/GYN in regards to placement of her IUD and to have pelvic ultrasound completed as an outpatient.  Patient voiced understanding the plan reasons to return immediately to the ED.  All questions answered.   ED Discharge Orders         Ordered    cephALEXin (KEFLEX) 500 MG capsule  2 times daily     01/03/18 2333    ondansetron (ZOFRAN) 4 MG tablet  Every 8 hours PRN,   Status:  Discontinued     01/03/18 2337    HYDROcodone-acetaminophen (NORCO/VICODIN) 5-325 MG tablet  Every 6 hours PRN     01/03/18 2337    tamsulosin (FLOMAX) 0.4 MG CAPS capsule  Daily     01/03/18 2337    ondansetron (ZOFRAN) 4 MG tablet  Every 8 hours PRN      01/03/18 2343           Tiannah Greenly S, PA-C 01/03/18 2351  Melene Plan, DO 01/07/18 [phone]

## 2018-01-05 LAB — URINE CULTURE

## 2018-06-25 ENCOUNTER — Ambulatory Visit (HOSPITAL_COMMUNITY)
Admission: EM | Admit: 2018-06-25 | Discharge: 2018-06-25 | Disposition: A | Discharge status: Home / Self Care | Payer: BC Managed Care – PPO | Attending: Emergency Medicine | Admitting: Emergency Medicine

## 2018-06-25 ENCOUNTER — Other Ambulatory Visit: Payer: Self-pay

## 2018-06-25 ENCOUNTER — Encounter (HOSPITAL_COMMUNITY): Payer: Self-pay | Admitting: Emergency Medicine

## 2018-06-25 DIAGNOSIS — J069 Acute upper respiratory infection, unspecified: Secondary | ICD-10-CM

## 2018-06-25 DIAGNOSIS — J0140 Acute pansinusitis, unspecified: Secondary | ICD-10-CM

## 2018-06-25 MED ORDER — DOXYCYCLINE HYCLATE 100 MG PO CAPS: 100.0000 mg | ORAL_CAPSULE | Freq: Two times a day (BID) | ORAL | 0 refills | Status: AC

## 2018-06-25 MED ORDER — HYDROCOD POLST-CPM POLST ER 10-8 MG/5ML PO SUER: 5.0000 mL | Freq: Two times a day (BID) | ORAL | 0 refills | Status: DC | PRN

## 2018-06-25 MED ORDER — FLUTICASONE PROPIONATE 50 MCG/ACT NA SUSP: 2.0000 | Freq: Every day | NASAL | 0 refills | Status: DC

## 2018-06-25 MED ORDER — IBUPROFEN 600 MG PO TABS: 600.0000 mg | ORAL_TABLET | Freq: Four times a day (QID) | ORAL | 0 refills | Status: DC | PRN

## 2018-06-25 NOTE — Discharge Instructions (Addendum)
Up other cold medications.  Tussionex for the cough.  Start Mucinex-D to keep the mucous thin and to decongest you.  Return to the ER if you get worse, have a fever >100.4, or for any concerns. You may take 600 mg of motrin with 1 gram of tylenol up to 3-4 times a day as needed for pain. This is an effective combination for pain.  Most sinus infections are viral and do not need antibiotics unless you have a high fever, have had this for 10 days, or you get better and then get sick again. Use a NeilMed sinus rinse with distilled water as often as you want to to reduce nasal congestion. Follow the directions on the box.   Go to www.goodrx.com to look up your medications. This will give you a list of where you can find your prescriptions at the most affordable prices. Or you can ask the pharmacist what the cash price is. This is frequently cheaper than going through insurance.

## 2018-06-25 NOTE — ED Provider Notes (Signed)
HPI  SUBJECTIVE:  Phyllis House is a 43 y.o. female who presents with 6 days of a "severe URI".  She reports body aches, thick, green nasal congestion, rhinorrhea, sinus pain and pressure, postnasal drip, sore throat.  She reports a nonproductive cough.  No fevers, facial swelling, upper dental pain, allergy symptoms.  No wheezing, chest pain, shortness of breath.  No antipyretic in the past 4 to 6 hours or antibiotics in the past month.  She is tried rest, NyQuil, DayQuil.  The NyQuil, DayQuil help.  Symptoms are worse with bending forward, lying down.  States that she is unable to sleep at night secondary to the cough.  Past medical history negative for allergies, diabetes, hypertension, frequent sinusitis, pulmonary disease, smoking.  LMP: January.  Has a Mirena.  Denies the possibility of being pregnant.  PMD: Dr. Chilton Si   Past Medical History:  Diagnosis Date  . Eczema   . H/O varicella   . History of herpes genitalis    LAST BREAK OUT 6 YRS AGO  . History of kidney stones   . Migraine   . Nephrolithiasis    bilateral-- per last renal u/s 07-27-2016  . Polycystic ovarian syndrome   . Right ureteral stone     Past Surgical History:  Procedure Laterality Date  . CYSTOSCOPY W/ URETERAL STENT PLACEMENT Left 07/01/2012   Procedure: CYSTOSCOPY WITH RETROGRADE PYELOGRAM/URETERAL STENT PLACEMENT;  Surgeon: Milford Cage, MD;  Location: [facility];  Service: Urology;  Laterality: Left;  . CYSTOSCOPY W/ URETERAL STENT PLACEMENT Right 07/27/2016   Procedure: CYSTOSCOPY WITH RETROGRADE PYELOGRAM/URETERAL STENT PLACEMENT;  Surgeon: Alfredo Martinez, MD;  Location: [facility];  Service: Urology;  Laterality: Right;  . CYSTOSCOPY WITH RETROGRADE PYELOGRAM, URETEROSCOPY AND STENT PLACEMENT Left 07/15/2012   Procedure: CYSTOSCOPY WITH left RETROGRADE PYELOGRAM,left  URETEROSCOPY AND left STENT exchange ;  Surgeon: Milford Cage, MD;  Location: [facility];  Service:  Urology;  Laterality: Left;  LEFT URETER STENT EXCHANGE    . CYSTOSCOPY WITH RETROGRADE PYELOGRAM, URETEROSCOPY AND STENT PLACEMENT Right 08/11/2016   Procedure: CYSTOSCOPY WITH RIGHT RETROGRADE PYELOGRAM,RIGHT  URETEROSCOPY AND STENT CHANGE;  Surgeon: Ihor Gully, MD;  Location: [facility];  Service: Urology;  Laterality: Right;  . CYSTOSCOPY/RETROGRADE/URETEROSCOPY/STONE EXTRACTION WITH BASKET  AGE 53  . HOLMIUM LASER APPLICATION Left 07/15/2012   Procedure: HOLMIUM LASER APPLICATION;  Surgeon: Milford Cage, MD;  Location: [facility];  Service: Urology;  Laterality: Left;  . HOLMIUM LASER APPLICATION Right 08/11/2016   Procedure: HOLMIUM LASER APPLICATION;  Surgeon: Ihor Gully, MD;  Location: [facility];  Service: Urology;  Laterality: Right;  . TONSILLECTOMY  AS CHILD    Family History  Problem Relation Age of Onset  . Thyroid disease Mother   . Depression Mother   . Cancer Father        leaukemia  . Thyroid disease Brother   . Diabetes Maternal Aunt   . Cancer Maternal Grandmother        GI  . Diabetes Maternal Grandfather   . Heart disease Paternal Grandmother     Social History   Tobacco Use  . Smoking status: Never Smoker  . Smokeless tobacco: Never Used  Substance Use Topics  . Alcohol use: No  . Drug use: No    No current facility-administered medications for this encounter.   Current Outpatient Medications:  .  chlorpheniramine-HYDROcodone (TUSSIONEX PENNKINETIC ER) 10-8 MG/5ML SUER, Take 5 mLs by mouth every 12 (  twelve) hours as needed for cough., Disp: 60 mL, Rfl: 0 .  doxycycline (VIBRAMYCIN) 100 MG capsule, Take 1 capsule (100 mg total) by mouth 2 (two) times daily for 7 days., Disp: 14 capsule, Rfl: 0 .  fluticasone (FLONASE) 50 MCG/ACT nasal spray, Place 2 sprays into both nostrils daily., Disp: 16 g, Rfl: 0 .  ibuprofen (ADVIL,MOTRIN) 600 MG tablet, Take 1 tablet (600 mg total) by mouth every 6 (six)  hours as needed., Disp: 30 tablet, Rfl: 0 .  levonorgestrel (MIRENA) 20 MCG/24HR IUD, 1 each by Intrauterine route once. February 2014, Disp: , Rfl:   Allergies  Allergen Reactions  . Adhesive [Tape] Rash    Pt states paper tape causes rash, but "plastic tape" is not problematic.      ROS  As noted in HPI.   Physical Exam  BP 128/78 (BP Location: Right Arm)   Pulse 69   Temp 98.7 F (37.1 C) (Temporal)   Resp 19   SpO2 99%   Constitutional: Well developed, well nourished, no acute distress Eyes:  EOMI, conjunctiva normal bilaterally HENT: Normocephalic, atraumatic,mucus membranes moist.  Erythematous, swollen turbinates with mucoid nasal congestion.  Positive frontal and maxillary sinus tenderness.  Normal oropharynx.  Normal tonsils without exudates.  Uvula midline.  Positive cobblestoning.  No postnasal drip. Neck: No cervical lymphadenopathy Respiratory: Normal inspiratory effort, lungs clear bilaterally Cardiovascular: Normal rate regular rhythm no murmurs rubs or gallops GI: nondistended skin: No rash, skin intact Musculoskeletal: no deformities Neurologic: Alert & oriented x 3, no focal neuro deficits Psychiatric: Speech and behavior appropriate   ED Course   Medications - No data to display  No orders of the defined types were placed in this encounter.   No results found for this or any previous visit (from the past 24 hour(s)). No results found.  ED Clinical Impression  Acute non-recurrent pansinusitis  Acute upper respiratory infection   ED Assessment/Plan  [place] Narcotic database reviewed for this patient, and feel that the risk/benefit ratio today is favorable for proceeding with a prescription for controlled substance.  No opiate prescriptions since 2019.  Patient with a URI/ sinusitis.  Home with saline nasal irrigation, Flonase, Mucinex D, ibuprofen 600 mg combined with 1 g of Tylenol, Tussionex, wait-and-see prescription of doxycycline.  Follow-up  with PMD as needed.  Discussed MDM, treatment plan, and plan for follow-up with patient.  patient agrees with plan.   Meds ordered this encounter  Medications  . fluticasone (FLONASE) 50 MCG/ACT nasal spray    Sig: Place 2 sprays into both nostrils daily.    Dispense:  16 g    Refill:  0  . chlorpheniramine-HYDROcodone (TUSSIONEX PENNKINETIC ER) 10-8 MG/5ML SUER    Sig: Take 5 mLs by mouth every 12 (twelve) hours as needed for cough.    Dispense:  60 mL    Refill:  0  . ibuprofen (ADVIL,MOTRIN) 600 MG tablet    Sig: Take 1 tablet (600 mg total) by mouth every 6 (six) hours as needed.    Dispense:  30 tablet    Refill:  0  . doxycycline (VIBRAMYCIN) 100 MG capsule    Sig: Take 1 capsule (100 mg total) by mouth 2 (two) times daily for 7 days.    Dispense:  14 capsule    Refill:  0    *This clinic note was created using Scientist, clinical (histocompatibility and immunogenetics). Therefore, there may be occasional mistakes despite careful proofreading.   ?   Domenick Gong, MD 06/26/18  0803  

## 2018-06-25 NOTE — ED Triage Notes (Signed)
Symptoms started 5 days ago.  Facial pressure, pain behind eyes, dry cough and thick sinus mucus.

## 2018-09-12 ENCOUNTER — Other Ambulatory Visit: Payer: Self-pay | Admitting: Urology

## 2018-09-13 ENCOUNTER — Encounter (HOSPITAL_COMMUNITY): Payer: Self-pay

## 2018-09-13 NOTE — Patient Instructions (Addendum)
DUE TO COVID-19 NO VISITORS ARE ALLOWED IN THE HOSPITAL AT THIS TIME   COVID SWAB TESTING MUST BE COMPLETED ON: Today, Sep 16, 2018 at 11:15AM   Your procedure is scheduled on: Thursday, Sep 19, 2018   Surgery Time:  7:15AM-8:45AM   Report to [facility] Main  Entrance   Report to Short Stay at 5:30 AM   Call this number if you have problems the morning of surgery [phone]   Do not eat food or drink liquids :After Midnight.   Brush your teeth the morning of surgery.   Do NOT smoke after Midnight   Take these medicines the morning of surgery with A SIP OF WATER: Tamsulosin, Zofran or Phenergan or Dramine if needed                               You may not have any metal on your body including hair pins, jewelry, and body piercings             Do not wear make-up, lotions, powders, perfumes/cologne, or deodorant             Do not wear nail polish.  Do not shave  48 hours prior to surgery.              Do not bring valuables to the hospital. [facility] IS NOT             RESPONSIBLE   FOR VALUABLES.   Contacts, dentures or bridgework may not be worn into surgery.    Patients discharged the day of surgery will not be allowed to drive home.   Special Instructions: Bring a copy of your healthcare power of attorney and living will documents         the day of surgery if you haven't scanned them in before.              Please read over the following fact sheets you were given:   [facility] - Preparing for Surgery Before surgery, you can play an important role.  Because skin is not sterile, your skin needs to be as free of germs as possible.  You can reduce the number of germs on your skin by washing with CHG (chlorahexidine gluconate) soap before surgery.  CHG is an antiseptic cleaner which kills germs and bonds with the skin to continue killing germs even after washing. Please DO NOT use if you have an allergy to CHG or antibacterial soaps.  If your skin becomes  reddened/irritated stop using the CHG and inform your nurse when you arrive at Short Stay. Do not shave (including legs and underarms) for at least 48 hours prior to the first CHG shower.  You may shave your face/neck.  Please follow these instructions carefully:  1.  Shower with CHG Soap the night before surgery and the  morning of surgery.  2.  If you choose to wash your hair, wash your hair first as usual with your normal  shampoo.  3.  After you shampoo, rinse your hair and body thoroughly to remove the shampoo.                             4.  Use CHG as you would any other liquid soap.  You can apply chg directly to the skin and wash.  Gently with a scrungie or clean washcloth.  5.  Apply the CHG Soap to your body ONLY FROM THE NECK DOWN.   Do   not use on face/ open                           Wound or open sores. Avoid contact with eyes, ears mouth and   genitals (private parts).                       Wash face,  Genitals (private parts) with your normal soap.             6.  Wash thoroughly, paying special attention to the area where your    surgery  will be performed.  7.  Thoroughly rinse your body with warm water from the neck down.  8.  DO NOT shower/wash with your normal soap after using and rinsing off the CHG Soap.                9.  Pat yourself dry with a clean towel.            10.  Wear clean pajamas.            11.  Place clean sheets on your bed the night of your first shower and do not  sleep with pets. Day of Surgery : Do not apply any lotions/deodorants the morning of surgery.  Please wear clean clothes to the hospital/surgery center.  FAILURE TO FOLLOW THESE INSTRUCTIONS MAY RESULT IN THE CANCELLATION OF YOUR SURGERY  PATIENT SIGNATURE_________________________________  NURSE SIGNATURE__________________________________  ________________________________________________________________________

## 2018-09-16 ENCOUNTER — Other Ambulatory Visit: Payer: Self-pay

## 2018-09-16 ENCOUNTER — Encounter (HOSPITAL_COMMUNITY)
Admission: RE | Admit: 2018-09-16 | Discharge: 2018-09-16 | Disposition: A | Discharge status: Home / Self Care | Payer: BC Managed Care – PPO | Source: Ambulatory Visit | Attending: Urology | Admitting: Urology

## 2018-09-16 ENCOUNTER — Encounter (HOSPITAL_COMMUNITY): Payer: Self-pay

## 2018-09-16 ENCOUNTER — Other Ambulatory Visit (HOSPITAL_COMMUNITY)
Admission: RE | Admit: 2018-09-16 | Discharge: 2018-09-16 | Disposition: A | Discharge status: Home / Self Care | Payer: BC Managed Care – PPO | Source: Ambulatory Visit | Attending: Urology | Admitting: Urology

## 2018-09-16 DIAGNOSIS — N200 Calculus of kidney: Secondary | ICD-10-CM | POA: Insufficient documentation

## 2018-09-16 DIAGNOSIS — Z1159 Encounter for screening for other viral diseases: Secondary | ICD-10-CM | POA: Diagnosis not present

## 2018-09-16 DIAGNOSIS — Z01812 Encounter for preprocedural laboratory examination: Secondary | ICD-10-CM | POA: Diagnosis present

## 2018-09-16 DIAGNOSIS — N1330 Unspecified hydronephrosis: Secondary | ICD-10-CM | POA: Diagnosis not present

## 2018-09-16 HISTORY — DX: Other seasonal allergic rhinitis: J30.2

## 2018-09-16 HISTORY — DX: Anxiety disorder, unspecified: F41.9

## 2018-09-16 LAB — CBC
HCT: 39.2 % (ref 36.0–46.0)
Hemoglobin: 12 g/dL (ref 12.0–15.0)
MCH: 24.1 pg — ABNORMAL LOW (ref 26.0–34.0)
MCHC: 30.6 g/dL (ref 30.0–36.0)
MCV: 78.9 fL — ABNORMAL LOW (ref 80.0–100.0)
Platelets: 166 10*3/uL (ref 150–400)
RBC: 4.97 MIL/uL (ref 3.87–5.11)
RDW: 15.6 % — ABNORMAL HIGH (ref 11.5–15.5)
WBC: 3.1 10*3/uL — ABNORMAL LOW (ref 4.0–10.5)
nRBC: 0 % (ref 0.0–0.2)

## 2018-09-17 LAB — NOVEL CORONAVIRUS, NAA (HOSP ORDER, SEND-OUT TO REF LAB; TAT 18-24 HRS): SARS-CoV-2, NAA: NOT DETECTED

## 2018-09-18 NOTE — H&P (Signed)
Office Visit Report     09/11/2018   --------------------------------------------------------------------------------   Phyllis House  MRN: [id number]  PRIMARY CARE:  MEDICAID [place] [facility] [place]  DOB: Sep 13, 1975, 43 year old Female  REFERRING:    SSN: [id number]  PROVIDER:  Festus Aloe, M.D.    TREATING:  Azucena Fallen    LOCATION:  [facility] [phone]   --------------------------------------------------------------------------------   CC: I have kidney stones.  HPI: Phyllis House is a 43 year-old female established patient who is here for renal calculi.  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 Prior serum evaluation with PTH, calcium, uric acid and electrolytes was normal. Tried: HCTZ 71m- induced symptomatic hypokalemia despite being on Kcitrate. Also, Kcit 20 meq BID caused GI upset.   Recent rt URS Apr 2018 with Dr. OKarsten Ro Repeat labs were done which included normal serum labs. 24-hour urine revealed very low volume, extreme hypocitraturia. Her urine Ca was normal.   09/11/18: Patient with long history of stones. She underwent bilateral ureteroscopy in 01/2018, with the right side being a staged procedure due to 2 large distal right ureteral stones, which were impacted. She did not follow up, as planned, after that procedure. Today she presents with complaints of gross hematuria and bilateral flank pain. She states that symptoms have been ongoing intermittently for the past 8 weeks, but have progressed over the past 2 weeks. Initially she felt right flank pain which radiated to her abdomen. She continues to complain of this pain today. She describes the pain as intermittent with episodes of more severe pain. She states that one week ago she developed LLQ pain as well, but she denies left flank pain. She has been using Aleve or Ibuprofen  for the pain, which does help somewhat. She does have associated nausea at times. She has been using Zofran for this. She states that she has increased hydration and notices increased frequency and urgency. She will have UUI on occasion. She notes gross hematuria for the past 2 weeks. She denies clots or difficulties voiding. She will have mild dysuria at times. No fevers or chills. She does not feel she has noted decrease in urine output.   The problem is on both sides. She first stated noticing pain on approximately 12/31/2006. This is not her first kidney stone. She is currently having flank pain, groin pain, and nausea. She denies having back pain, vomiting, fever, and chills. She has caught a stone in her urine strainer since her symptoms began.   She has had ureteral stent and ureteroscopy for treatment of her stones in the past.     ALLERGIES: No Allergies    MEDICATIONS: Oxybutynin Chloride 5 mg tablet 1 tablet PO TID PRN  Tamsulosin Hcl 0.4 mg capsule  Advil  Aleve  Cephalexin 500 mg tablet  Hydrocodone-Acetaminophen 5 mg-325 mg tablet  Ibuprofen  Zofran 4 mg tablet 1 tablet PO Q 8 H PRN  Zofran     GU PSH: Cysto Uretero Lithotripsy - 2014 Cystoscopy And Treatment, Right - 02/22/2018, 2008 Cystoscopy Insert Stent, Right - 02/22/2018, Left - 02/08/2018, Right - 2018, 2014, 2014 Ureteroscopic laser litho, Left - 02/22/2018, Right - 02/08/2018, Right - 2018 Ureteroscopic stone removal, Right - 2018      PSH Notes: Cystoscopy With Ureteroscopy With Lithotripsy, Cystoscopy With Insertion Of Ureteral Stent Left, Cystoscopy With Insertion Of Ureteral Stent Left, Cystoscopy With Manipulation Of Ureteral  Calculus   NON-GU PSH: None   GU PMH: Renal calculus - 01/16/2017, Nephrolithiasis, - 2015 Mixed incontinence, Mixed stress and urge urinary incontinence - 2015 Urinary Tract Inf, Unspec site, Urinary tract infection - 2015, Pyuria, - 2015 Hydronephrosis Unspec, Hydronephrosis On The  Right - 2014 Ureteral calculus, Ureteral Stone - 2014    NON-GU PMH: Bacteriuria - 2018 Encounter for general adult medical examination without abnormal findings, Encounter for preventive health examination - 2015 Hypokalemia, Hypokalemia - 2015 Hypercalciuria, Hypercalciuria - 2014 Personal history of other diseases of the nervous system and sense organs, History of migraine headaches - 2014 Personal history of other specified conditions, History of nausea - 2014    FAMILY HISTORY: Family Health Status Number - Runs In Family Father Deceased At Age27 ___ - Runs In Family nephrolithiasis - Uncle, Mother, Grandfather, Aunt   SOCIAL HISTORY: Marital Status: Single Preferred Language: English; Ethnicity: Not Hispanic Or Latino; Race: White Current Smoking Status: Patient has never smoked.   Tobacco Use Assessment Completed: Used Tobacco in last 30 days? Has never drank.  Drinks 3 caffeinated drinks per day. 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     Notes: Never A Smoker, Drug Use, Caffeine Use, Marital History - Single, Alcohol Use   REVIEW OF SYSTEMS:    GU Review Female:   Patient reports frequent urination, hard to postpone urination, and burning /pain with urination. Patient denies get up at night to urinate, leakage of urine, stream starts and stops, trouble starting your stream, have to strain to urinate, and being pregnant.  Gastrointestinal (Upper):   Patient reports vomiting and nausea. Patient denies indigestion/ heartburn.  Gastrointestinal (Lower):   Patient denies diarrhea and constipation.  Constitutional:   Patient denies fever, night sweats, weight loss, and fatigue.  Skin:   Patient denies skin rash/ lesion and itching.  Eyes:   Patient denies blurred vision and double vision.  Ears/ Nose/ Throat:   Patient denies sore throat and sinus problems.  Hematologic/Lymphatic:   Patient denies swollen glands and easy bruising.  Cardiovascular:   Patient denies leg  swelling and chest pains.  Respiratory:   Patient denies cough and shortness of breath.  Endocrine:   Patient denies excessive thirst.  Musculoskeletal:   Patient reports back pain. Patient denies joint pain.  Neurological:   Patient reports headaches. Patient denies dizziness.  Psychologic:   Patient denies depression and anxiety.   Notes: B flank pain and lower abdomen, blood in urine for 18 days     VITAL SIGNS:      09/11/2018 02:51 PM  Weight 180 lb / 81.65 kg  Height 63 in / 160.02 cm  BP 113/76 mmHg  Heart Rate 73 /min  Temperature 98.7 F / 37.0 C  BMI 31.9 kg/m   MULTI-SYSTEM PHYSICAL EXAMINATION:    Constitutional: Well-nourished. No physical deformities. Normally developed. Good grooming.  Respiratory: No labored breathing, no use of accessory muscles.   Cardiovascular: Normal temperature, normal extremity pulses, no swelling, no varicosities.  Neurologic / Psychiatric: Oriented to time, oriented to place, oriented to person. No depression, no anxiety, no agitation.  Gastrointestinal: No mass, no tenderness, no rigidity, non obese abdomen. No CVAT.   Musculoskeletal: Normal gait and station of head and neck.     PAST DATA REVIEWED:  Source Of History:  Patient  Records Review:   Previous Patient Records  Urine Test Review:   Urinalysis, Urine Culture  X-Ray Review: C.T. Abdomen/Pelvis: Reviewed Films. Reviewed Report.  PROCEDURES:         C.T. 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   Moderate right hydronephrosis. Multiple stacked calculi in the  distal right ureter, measuring 2.2 cm in aggregate length.   Suspected 3 mm distal left ureteral calculus, although equivocal. No  hydronephrosis.        Patient confirmed No Neulasta OnPro Device.           Urinalysis w/Scope Dipstick  Dipstick Cont'd Micro  Color: Amber Bilirubin: Invalid mg/dL WBC/hpf: 6 - 10/hpf  Appearance: Turbid Ketones: Invalid mg/dL RBC/hpf: >60/hpf  Specific Gravity: Invalid Blood: Invalid ery/uL Bacteria: Rare (0-9/hpf)  pH: Invalid Protein: Invalid mg/dL Cystals: NS (Not Seen)  Glucose: Invalid mg/dL Urobilinogen: Invalid mg/dL Casts: NS (Not Seen)    Nitrites: Invalid Trichomonas: Not Present    Leukocyte Esterase: Invalid leu/uL Mucous: Not Present      Epithelial Cells: 0 - 5/hpf      Yeast: NS (Not Seen)      Sperm: Not Present    Notes: NO DIP DUE TO DISCOLORATION    ASSESSMENT:      ICD-10 Details  1 GU:   Renal calculus - N20.0   2   Flank Pain - R10.84   3   RLQ pain - R10.31   4   LLQ pain - R10.32   5   Gross hematuria - R31.0    PLAN:            Medications New Meds: Tamsulosin Hcl 0.4 mg capsule 1 capsule PO Daily   #30  0 Refill(s)    Refill Meds: Hydrocodone-Acetaminophen 5 mg-325 mg tablet 1 tablet PO Q 6 H PRN   #20  0 Refill(s)  Promethazine Hcl 12.5 mg tablet 1 tablet PO Q 8 H PRN   #20  0 Refill(s)            Orders Labs Urine Culture  X-Rays: C.T. Stone Protocol Without Contrast  X-Ray Notes: History:  Hematuria: Yes/No  Patient to see MD after exam: Yes/No  Previous exam: CT / IVP/ US/ KUB/ None  When:  Where:  Diabetic: Yes/ No  BUN/ Creatinine:  Date of last BUN Creatinine:  Weight in pounds:  Allergy- IV Contrast: Yes/ No  Conflicting diabetic meds: Yes/ No  Diabetic Meds:  Prior Authorization #: [id number]           Schedule         Document Letter(s):  Created for Patient: Clinical Summary         Notes:   I will send urine for culture today. CT imaging reveals what appears to be multiple caculi along the course of the right distal ureter in the vicinity of the UVJ with moderate hydronephrosis noted. Numerous intrarenal calculi versus medullary nephrocalcinosis also noted bilaterally. No calculus noted along the course of  the left ureter by my interpretation. There is one calcification noted distally that if close to the ureter, but I feel it is likely outside of the ureter as it has been present on prior imaging studies. We reviewed treatment options in detail today. Given stone burden noted in the distal ureter, we reviewed the role of ureteroscopy. For ureteroscopy I described the risks which include general anesthetic complications, bleeding, infection, damage to contiguous structures, positioning injury, ureteral stricture, ureteral avulsion, ureteral injury, need for ureteral stent,  inability to perform ureteroscopy, need for an interval procedure, inability to clear stone burden, stent discomfort and pain. He voiced understanding. She will begin MET in the interim. We reviewed indications and potential side effects of all medications prescribed today. I encouraged that she hydrate well and strain her urine. We will be in touch regarding scheduling. Strict return precuations reviewed in the interim for fevers or progressive pain, nausea, or vomiting. Imaging study reviewed with Dr. Junious Silk today.   ** Radiology impression noted above. Impression reviewed with Dr. Junious Silk on 5/14. Given there is no hydronephrosis noted on the left, we will plan to move forward with cystoscopy, bilateral retrograde pyelogram, and bilateral URS next week if possible. I called and reviewed this with the patient who is in agreement with this plan. Strict return precuations reviewed to the clinic or [facility] ED for fevers, progressive pain, nausea, vomiting, or oliguria. She voiced understanding.     * Signed by Azucena Fallen on 09/12/18 at 11:01 AM (EDT)*     The information contained in this medical record document is considered private and confidential patient information. This information can only be used for the medical diagnosis and/or medical services that are being provided by the patient's selected caregivers. This information can only be  distributed outside of the patient's care if the patient agrees and signs waivers of authorization for this information to be sent to an outside source or route.  Add: I discussed patient with NP Ronal Fear and reviewed the chart, labs and CT. Pt cx + for mixed gram + and Phyllis House started her on cephalexin. She needs Lit-Control pH balance supplement.

## 2018-09-18 NOTE — Anesthesia Preprocedure Evaluation (Addendum)
Anesthesia Evaluation  Patient identified by MRN, date of birth, ID band Patient awake    Reviewed: Allergy & Precautions, NPO status , Patient's Chart, lab work & pertinent test results  History of Anesthesia Complications Negative for: history of anesthetic complications  Airway Mallampati: II  TM Distance: >3 FB Neck ROM: Full    Dental no notable dental hx.    Pulmonary neg pulmonary ROS,    Pulmonary exam normal        Cardiovascular negative cardio ROS Normal cardiovascular exam     Neuro/Psych  Headaches, Anxiety    GI/Hepatic negative GI ROS, Neg liver ROS,   Endo/Other  negative endocrine ROS  Renal/GU Ureteral stones     Musculoskeletal negative musculoskeletal ROS (+)   Abdominal   Peds  Hematology negative hematology ROS (+)   Anesthesia Other Findings Day of surgery medications reviewed with the patient.  Reproductive/Obstetrics                            Anesthesia Physical Anesthesia Plan  ASA: II  Anesthesia Plan: General   Post-op Pain Management:    Induction: Intravenous  PONV Risk Score and Plan: 3 and Treatment may vary due to age or medical condition, Ondansetron, Dexamethasone and Scopolamine patch - Pre-op  Airway Management Planned: LMA  Additional Equipment:   Intra-op Plan:   Post-operative Plan: Extubation in OR  Informed Consent: I have reviewed the patients History and Physical, chart, labs and discussed the procedure including the risks, benefits and alternatives for the proposed anesthesia with the patient or authorized representative who has indicated his/her understanding and acceptance.     Dental advisory given  Plan Discussed with: CRNA  Anesthesia Plan Comments:        Anesthesia Quick Evaluation

## 2018-09-18 NOTE — Progress Notes (Signed)
SPOKE W/   Left voicemail     SCREENING SYMPTOMS OF COVID 19:   COUGH--  RUNNY NOSE---   SORE THROAT---  NASAL CONGESTION----  SNEEZING----  SHORTNESS OF BREATH---  DIFFICULTY BREATHING---  TEMP >100.0 -----  UNEXPLAINED BODY ACHES------  CHILLS --------   HEADACHES ---------  LOSS OF SMELL/ TASTE --------    HAVE YOU OR ANY FAMILY MEMBER TRAVELLED PAST 14 DAYS OUT OF THE   COUNTY--- STATE---- COUNTRY----  HAVE YOU OR ANY FAMILY MEMBER BEEN EXPOSED TO ANYONE WITH COVID 19?     

## 2018-09-19 ENCOUNTER — Ambulatory Visit (HOSPITAL_COMMUNITY): Payer: BC Managed Care – PPO | Admitting: Physician Assistant

## 2018-09-19 ENCOUNTER — Encounter (HOSPITAL_COMMUNITY): Payer: Self-pay

## 2018-09-19 ENCOUNTER — Ambulatory Visit (HOSPITAL_COMMUNITY): Payer: BC Managed Care – PPO | Admitting: Certified Registered Nurse Anesthetist

## 2018-09-19 ENCOUNTER — Ambulatory Visit (HOSPITAL_COMMUNITY): Payer: BC Managed Care – PPO

## 2018-09-19 ENCOUNTER — Encounter (HOSPITAL_COMMUNITY)
Admission: RE | Disposition: A | Discharge status: Home / Self Care | Payer: Self-pay | Source: Home / Self Care | Attending: Urology

## 2018-09-19 ENCOUNTER — Ambulatory Visit (HOSPITAL_COMMUNITY)
Admission: RE | Admit: 2018-09-19 | Discharge: 2018-09-19 | Disposition: A | Discharge status: Home / Self Care | Payer: BC Managed Care – PPO | Attending: Urology | Admitting: Urology

## 2018-09-19 DIAGNOSIS — N132 Hydronephrosis with renal and ureteral calculous obstruction: Secondary | ICD-10-CM | POA: Insufficient documentation

## 2018-09-19 DIAGNOSIS — N201 Calculus of ureter: Secondary | ICD-10-CM

## 2018-09-19 DIAGNOSIS — Z79899 Other long term (current) drug therapy: Secondary | ICD-10-CM | POA: Insufficient documentation

## 2018-09-19 HISTORY — PX: CYSTOSCOPY/URETEROSCOPY/HOLMIUM LASER/STENT PLACEMENT: SHX6546

## 2018-09-19 LAB — PREGNANCY, URINE: Preg Test, Ur: NEGATIVE

## 2018-09-19 SURGERY — CYSTOSCOPY/URETEROSCOPY/HOLMIUM LASER/STENT PLACEMENT
Anesthesia: General | Site: Ureter | Laterality: Bilateral

## 2018-09-19 MED ORDER — PROMETHAZINE HCL 25 MG/ML IJ SOLN
6.2500 mg | INTRAMUSCULAR | Status: DC | PRN
  Administered 2018-09-19: 6.25 mg via INTRAVENOUS

## 2018-09-19 MED ORDER — LACTATED RINGERS IV SOLN
INTRAVENOUS | Status: DC
  Administered 2018-09-19: 06:00:00 via INTRAVENOUS

## 2018-09-19 MED ORDER — ONDANSETRON HCL 4 MG/2ML IJ SOLN
INTRAMUSCULAR | Status: DC | PRN
  Administered 2018-09-19: 4 mg via INTRAVENOUS

## 2018-09-19 MED ORDER — ONDANSETRON HCL 4 MG/2ML IJ SOLN
INTRAMUSCULAR | Status: AC
  Filled 2018-09-19: qty 2

## 2018-09-19 MED ORDER — FENTANYL CITRATE (PF) 100 MCG/2ML IJ SOLN
INTRAMUSCULAR | Status: DC | PRN
  Administered 2018-09-19 (×2): 25 ug via INTRAVENOUS
  Administered 2018-09-19: 50 ug via INTRAVENOUS
  Administered 2018-09-19: 25 ug via INTRAVENOUS
  Administered 2018-09-19: 50 ug via INTRAVENOUS
  Administered 2018-09-19: 25 ug via INTRAVENOUS

## 2018-09-19 MED ORDER — FENTANYL CITRATE (PF) 100 MCG/2ML IJ SOLN: 25.0000 ug | INTRAMUSCULAR | Status: DC | PRN

## 2018-09-19 MED ORDER — PROMETHAZINE HCL 25 MG/ML IJ SOLN
INTRAMUSCULAR | Status: AC
  Filled 2018-09-19: qty 1

## 2018-09-19 MED ORDER — FENTANYL CITRATE (PF) 100 MCG/2ML IJ SOLN
INTRAMUSCULAR | Status: AC
  Filled 2018-09-19: qty 2

## 2018-09-19 MED ORDER — SODIUM CHLORIDE 0.9 % IR SOLN
Status: DC | PRN
  Administered 2018-09-19: 3000 mL

## 2018-09-19 MED ORDER — SCOPOLAMINE 1 MG/3DAYS TD PT72
MEDICATED_PATCH | TRANSDERMAL | Status: AC
  Filled 2018-09-19: qty 1

## 2018-09-19 MED ORDER — CEFAZOLIN SODIUM-DEXTROSE 2-4 GM/100ML-% IV SOLN
INTRAVENOUS | Status: AC
  Filled 2018-09-19: qty 100

## 2018-09-19 MED ORDER — MIDAZOLAM HCL 2 MG/2ML IJ SOLN
INTRAMUSCULAR | Status: DC | PRN
  Administered 2018-09-19: 2 mg via INTRAVENOUS

## 2018-09-19 MED ORDER — IOHEXOL 300 MG/ML  SOLN
INTRAMUSCULAR | Status: DC | PRN
  Administered 2018-09-19: 09:00:00 11 mL via INTRATHECAL

## 2018-09-19 MED ORDER — DEXAMETHASONE SODIUM PHOSPHATE 4 MG/ML IJ SOLN
INTRAMUSCULAR | Status: DC | PRN
  Administered 2018-09-19: 5 mg via INTRAVENOUS

## 2018-09-19 MED ORDER — OXYCODONE HCL 5 MG/5ML PO SOLN: 5.0000 mg | Freq: Once | ORAL | Status: DC | PRN

## 2018-09-19 MED ORDER — PROPOFOL 10 MG/ML IV BOLUS
INTRAVENOUS | Status: DC | PRN
  Administered 2018-09-19: 160 mg via INTRAVENOUS

## 2018-09-19 MED ORDER — SCOPOLAMINE 1 MG/3DAYS TD PT72
1.0000 | MEDICATED_PATCH | TRANSDERMAL | Status: DC
  Administered 2018-09-19: 07:00:00 1.5 mg via TRANSDERMAL

## 2018-09-19 MED ORDER — LIDOCAINE 2% (20 MG/ML) 5 ML SYRINGE
INTRAMUSCULAR | Status: AC
  Filled 2018-09-19: qty 5

## 2018-09-19 MED ORDER — OXYCODONE HCL 5 MG PO TABS: 5.0000 mg | ORAL_TABLET | Freq: Once | ORAL | Status: DC | PRN

## 2018-09-19 MED ORDER — MIDAZOLAM HCL 2 MG/2ML IJ SOLN
INTRAMUSCULAR | Status: AC
  Filled 2018-09-19: qty 2

## 2018-09-19 MED ORDER — PROPOFOL 10 MG/ML IV BOLUS
INTRAVENOUS | Status: AC
  Filled 2018-09-19: qty 40

## 2018-09-19 MED ORDER — CEFAZOLIN SODIUM-DEXTROSE 2-4 GM/100ML-% IV SOLN
2.0000 g | Freq: Once | INTRAVENOUS | Status: AC
  Administered 2018-09-19: 2 g via INTRAVENOUS

## 2018-09-19 MED ORDER — DEXAMETHASONE SODIUM PHOSPHATE 10 MG/ML IJ SOLN
INTRAMUSCULAR | Status: AC
  Filled 2018-09-19: qty 1

## 2018-09-19 MED ORDER — LIDOCAINE 2% (20 MG/ML) 5 ML SYRINGE
INTRAMUSCULAR | Status: DC | PRN
  Administered 2018-09-19: 80 mg via INTRAVENOUS

## 2018-09-19 SURGICAL SUPPLY — 21 items
BAG URO CATCHER STRL LF (MISCELLANEOUS) ×2 IMPLANT
BASKET ZERO TIP NITINOL 2.4FR (BASKET) ×2 IMPLANT
BSKT STON RTRVL ZERO TP 2.4FR (BASKET) ×1
CATH INTERMIT  6FR 70CM (CATHETERS) ×3 IMPLANT
CATH URET 5FR 28IN CONE TIP (BALLOONS) ×1
CATH URET 5FR 70CM CONE TIP (BALLOONS) ×1 IMPLANT
CLOTH BEACON ORANGE TIMEOUT ST (SAFETY) ×2 IMPLANT
FIBER LASER TRAC TIP (UROLOGICAL SUPPLIES) ×2 IMPLANT
GLOVE BIO SURGEON STRL SZ7.5 (GLOVE) ×2 IMPLANT
GOWN STRL REUS W/TWL XL LVL3 (GOWN DISPOSABLE) ×2 IMPLANT
GUIDEWIRE STR DUAL SENSOR (WIRE) ×2 IMPLANT
GUIDEWIRE ZIPWRE .038 STRAIGHT (WIRE) ×1 IMPLANT
KIT TURNOVER KIT A (KITS) ×1 IMPLANT
MANIFOLD NEPTUNE II (INSTRUMENTS) ×2 IMPLANT
PACK CYSTO (CUSTOM PROCEDURE TRAY) ×2 IMPLANT
SHEATH URETERAL 12FRX28CM (UROLOGICAL SUPPLIES) ×1 IMPLANT
SHEATH URETERAL 12FRX35CM (MISCELLANEOUS) ×1 IMPLANT
STENT URET 6FRX24 CONTOUR (STENTS) ×2 IMPLANT
TUBING CONNECTING 10 (TUBING) ×2 IMPLANT
TUBING UROLOGY SET (TUBING) ×1 IMPLANT
WIRE COONS/BENSON .038X145CM (WIRE) ×1 IMPLANT

## 2018-09-19 NOTE — Op Note (Addendum)
Preoperative Diagnosis: Bilateral ureteral stones, bilateral nephrolithiasis  Postoperative Diagnosis:  Same  Procedure(s) Performed:   - Cystourethroscopy - Bilateral ureteroscopic stone extraction with laser lithotripsy - Bilateral retrograde pyelogram - Bilateral ureteral stent placement - Intraoperative fluoroscopy with interpretation <1hr.   Teaching Surgeon:  [name], MD  Resident Surgeon:  Elyn Aquas, MD  Assistant(s):  None  Anesthesia:  General  Fluids:  See anesthesia record  Estimated blood loss:  0cc  Specimens: Stone for analysis  Drains:  Bilateral 6Fr x 24cm JJ ureteral stent without dangler  Complications:  None  Indications: 43 y.o. patient with a history of a long history of calcium phosphate stones who presented with bilateral flank pain, groin pain, and nausea who was previously evaluated and elected to undergo bilateral ureteroscopic stone extraction. Risks & benefits of the procedure discussed with the patient, who wishes to proceed.  Findings:  - Cystoscopy demonstrated normal urethra and bladder. - Right ureteroscopy demonstrated 3 large distal ureteral stones which were impacted into the wall of the ureter with significant edema around the stones. The stones were fragmented using laser lithotripsy and removed with basket extraction. There was mild ureteral dilation proximal to the stone fragments. Due to ureteral edema and impaction, it was felt unsafe to pass a sheath into the kidney to treat non-obstructing renal stone burden. Right ureteral stent was placed.  - Left ureteroscopy demonstrated a small stone in the distal ureter which was fragmented using laser lithotripsy with removal of all stone fragments. Left ureteral stent was placed.   Radiologic Interpretation of Retrograde Pyelogram: - Right retrograde pyelogram demonstrated radio-opacities in the distal ureter consistent with stones on CT with minimal contrast passage proximal to  the stones. There was no contrast extravasation. Mild proximal dilation noted.  - Left retrograde pyelogram demonstrated a filling defect in the distal ureter with mild - moderate ureteral dilation proximal to the filling defect.    Description:  The patient was correctly identified in the preop holding area where written informed consent as well potential risk and complication reviewed. The patient agreed. They were brought back to the operative suite where a preinduction timeout was performed. Once correct information was verified, general anesthesia was induced. They were then gently placed into dorsal lithotomy position with SCDs in place for VTE prophylaxis. They were prepped and draped in the usual sterile fashion and given appropriate preoperative antibiotics. A second timeout was then performed.   We inserted a 70F rigid cystoscope per urethra with copious lubrication and normal saline irrigation running. This demonstrated findings as described above.    We cannulated the right ureteral orifice with a 6Fr open ended catheter and a retrograde pyelogram was performed with findings as noted above. We then placed the sensor wire into the right kidney and the open ended catheter was removed. We then removed the cystoscope leaving our sensor wire in place.  We then passed a semi-rigid ureteroscope alongside the wire and encountered distal ureteral stones which were impacted into the ureteral wall. Wethen obtained a 200 micron holmium laser fiber and performed laser lithotripsy until all of the stone burden was fragmented into multiple small fragments which were basket extracted using a zero-tip nitinol wire basket without complication. The sensor wire was noted to be sub-mucosal at the site of stone impaction and was replaced with an intra-luminal glide wire under direct vision with the scope. The sensor was removed. Stone fragments were sent for chemical analysis. We then re-explored the location of  stone  treatment which demonstrated significant ureteral edema at the site of stone impaction but no injury. We made it to the normal mid-ureter at the iliacs and no other stone or stone fragements noted.   We then turned our attention to the left side and cannulated the left ureteral orifice with the 6Fr open ended catheter and a retrograde pyelogram was performed with findings as noted above. We then replaced the sensor wire into the left kidney and the 6Fr open ended catheter was removed. We then removed the cystoscope leaving our sensor wire in place.  We then passed a semi-rigid ureteroscope alongside the wire and encountered a small left distal ureteral stone. We tried to pull the stone without fragmentation but it held up at the left UVJ. We dropped the stone and performed laser lithotripsy until all of the stone burden was fragmented into several small fragments which were basket extracted using a zero-tip nitinol wire basket without complication. We then re-explored the location of stone treatment which demonstrated  minimal edema without ureteral injury. We again made it up to the iliacs.   At this point we elected to leave bilateral ureteral stents and withdrew our instruments leaving a sensor wire in place. We then advanced a left 6Fr x 24cm JJ ureteral stent without dangler with the assistance of a stent pusher under direct fluoroscopic & visual guidance without difficultly. Sensor wire removal demonstrated satisfactory stent curl proximally in the renal pelvis and distally in the bladder.   We turned our attention to right ureteral stent placement and backloaded the glidewire into the cystoscope. We then advanced a 6Fr x 24cm JJ ureteral stent without dangler with the assistance of a stent pusher under direct fluoroscopic & visual guidance without difficultly. Sensor wire removal demonstrated satisfactory stent curl proximally in the renal pelvis and distally in the bladder.   The bladder  was emptied and all instrumentation was removed. The patient was woken up from anesthesia and taken to the recovery unit for routine postoperative care.   Post Op Plan:   1. Discharge home from PACU when meeting discharge criteria. 2. Follow-up in 1 week for bilateral stent removal in clinic.    Attestation:  I was present and scrubbed for the entirety of the procedure.

## 2018-09-19 NOTE — Discharge Instructions (Signed)
Ureteral Stent Implantation, Care After Refer to this sheet in the next few weeks. These instructions provide you with information about caring for yourself after your procedure. Your health care provider may also give you more specific instructions. Your treatment has been planned according to current medical practices, but problems sometimes occur. Call your health care provider if you have any problems or questions after your procedure.  Be sure to keep follow-up for stent removal. You have a stent on the right and the left.   What can I expect after the procedure? After the procedure, it is common to have:  Nausea.  Mild pain when you urinate. You may feel this pain in your lower back or lower abdomen. Pain should stop within a few minutes after you urinate. This may last for up to 1 week.  A small amount of blood in your urine for several days. Follow these instructions at home:  Medicines  Take over-the-counter and prescription medicines only as told by your health care provider.  If you were prescribed an antibiotic medicine, take it as told by your health care provider. Do not stop taking the antibiotic even if you start to feel better.  Do not drive for 24 hours if you received a sedative.  Do not drive or operate heavy machinery while taking prescription pain medicines. Activity  Return to your normal activities as told by your health care provider. Ask your health care provider what activities are safe for you.  Do not lift anything that is heavier than 10 lb (4.5 kg). Follow this limit for 1 week after your procedure, or for as long as told by your health care provider. General instructions  Watch for any blood in your urine. Call your health care provider if the amount of blood in your urine increases.  If you have a catheter: ? Follow instructions from your health care provider about taking care of your catheter and collection bag. ? Do not take baths, swim, or use a  hot tub until your health care provider approves.  Drink enough fluid to keep your urine clear or pale yellow.  Keep all follow-up visits as told by your health care provider. This is important. Contact a health care provider if:  You have pain that gets worse or does not get better with medicine, especially pain when you urinate.  You have difficulty urinating.  You feel nauseous or you vomit repeatedly during a period of more than 2 days after the procedure. Get help right away if:  Your urine is dark red or has blood clots in it.  You are leaking urine (have incontinence).  The end of the stent comes out of your urethra.  You cannot urinate.  You have sudden, sharp, or severe pain in your abdomen or lower back.  You have a fever. This information is not intended to replace advice given to you by your health care provider. Make sure you discuss any questions you have with your health care provider. Document Released: 12/18/2012 Document Revised: 09/23/2015 Document Reviewed: 10/30/2014 Elsevier Interactive Patient Education  2019 [employer]   General Anesthesia, Adult, Care After This sheet gives you information about how to care for yourself after your procedure. Your health care provider may also give you more specific instructions. If you have problems or questions, contact your health care provider. What can I expect after the procedure? After the procedure, the following side effects are common:  Pain or discomfort at the IV site.  Nausea.  Vomiting.  Sore throat.  Trouble concentrating.  Feeling cold or chills.  Weak or tired.  Sleepiness and fatigue.  Soreness and body aches. These side effects can affect parts of the body that were not involved in surgery. Follow these instructions at home:  For at least 24 hours after the procedure:  Have a responsible adult stay with you. It is important to have someone help care for you until you are awake and  alert.  Rest as needed.  Do not: ? Participate in activities in which you could fall or become injured. ? Drive. ? Use heavy machinery. ? Drink alcohol. ? Take sleeping pills or medicines that cause drowsiness. ? Make important decisions or sign legal documents. ? Take care of children on your own. Eating and drinking  Follow any instructions from your health care provider about eating or drinking restrictions.  When you feel hungry, start by eating small amounts of foods that are soft and easy to digest (bland), such as toast. Gradually return to your regular diet.  Drink enough fluid to keep your urine pale yellow.  If you vomit, rehydrate by drinking water, juice, or clear broth. General instructions  If you have sleep apnea, surgery and certain medicines can increase your risk for breathing problems. Follow instructions from your health care provider about wearing your sleep device: ? Anytime you are sleeping, including during daytime naps. ? While taking prescription pain medicines, sleeping medicines, or medicines that make you drowsy.  Return to your normal activities as told by your health care provider. Ask your health care provider what activities are safe for you.  Take over-the-counter and prescription medicines only as told by your health care provider.  If you smoke, do not smoke without supervision.  Keep all follow-up visits as told by your health care provider. This is important. Contact a health care provider if:  You have nausea or vomiting that does not get better with medicine.  You cannot eat or drink without vomiting.  You have pain that does not get better with medicine.  You are unable to pass urine.  You develop a skin rash.  You have a fever.  You have redness around your IV site that gets worse. Get help right away if:  You have difficulty breathing.  You have chest pain.  You have blood in your urine or stool, or you vomit  blood. Summary  After the procedure, it is common to have a sore throat or nausea. It is also common to feel tired.  Have a responsible adult stay with you for the first 24 hours after general anesthesia. It is important to have someone help care for you until you are awake and alert.  When you feel hungry, start by eating small amounts of foods that are soft and easy to digest (bland), such as toast. Gradually return to your regular diet.  Drink enough fluid to keep your urine pale yellow.  Return to your normal activities as told by your health care provider. Ask your health care provider what activities are safe for you. This information is not intended to replace advice given to you by your health care provider. Make sure you discuss any questions you have with your health care provider. Document Released: 07/24/2000 Document Revised: 12/01/2016 Document Reviewed: 12/01/2016 Elsevier Interactive Patient Education  2019 [employer].

## 2018-09-19 NOTE — Anesthesia Procedure Notes (Signed)
Procedure Name: LMA Insertion Date/Time: 09/19/2018 7:32 AM Performed by: Vanessa , CRNA Pre-anesthesia Checklist: Emergency Drugs available, Patient identified, Suction available and Patient being monitored Patient Re-evaluated:Patient Re-evaluated prior to induction Oxygen Delivery Method: Circle system utilized Preoxygenation: Pre-oxygenation with 100% oxygen Induction Type: IV induction LMA: LMA inserted LMA Size: 4.0 Number of attempts: 1 Placement Confirmation: positive ETCO2 and breath sounds checked- equal and bilateral Tube secured with: Tape Dental Injury: Teeth and Oropharynx as per pre-operative assessment

## 2018-09-19 NOTE — Transfer of Care (Signed)
Immediate Anesthesia Transfer of Care Note  Patient: Phyllis House  Procedure(s) Performed: CYSTOSCOPY/RETROGRADE/URETEROSCOPY/HOLMIUM LASER/STENT PLACEMENT (Bilateral Ureter)  Patient Location: PACU  Anesthesia Type:General  Level of Consciousness: awake, alert , oriented and patient cooperative  Airway & Oxygen Therapy: Patient Spontanous Breathing and Patient connected to face mask  Post-op Assessment: Report given to RN and Post -op Vital signs reviewed and stable  Post vital signs: Reviewed and stable  Last Vitals:  Vitals Value Taken Time  BP 146/59 09/19/2018  9:28 AM  Temp    Pulse 91 09/19/2018  9:29 AM  Resp 19 09/19/2018  9:29 AM  SpO2 100 % 09/19/2018  9:29 AM  Vitals shown include unvalidated device data.  Last Pain:  Vitals:   09/19/18 0551  TempSrc:   PainSc: 0-No pain         Complications: No apparent anesthesia complications

## 2018-09-19 NOTE — Interval H&P Note (Signed)
History and Physical Interval Note:  09/19/2018 7:25 AM  Phyllis House  has presented today for surgery, with the diagnosis of RIGHT URETERAL CALCULUS, POSSIBLE LEFT URETERAL CALCULUS, BILATERAL RENAL STONES, HYDRONEPHROSIS.  The various methods of treatment have been discussed with the patient and family. After consideration of risks, benefits and other options for treatment, the patient has consented to  Procedure(s): CYSTOSCOPY/RETROGRADE/URETEROSCOPY/HOLMIUM LASER/STENT PLACEMENT (Bilateral) as a surgical intervention.  The patient's history has been reviewed, patient examined, no change in status, stable for surgery. She passed one stone since last week and still has some right flank pain. No left. Overall stone burden lower in kidneys compared to 12/2017 CT.   I have reviewed the patient's chart and labs.  Questions were answered to the patient's satisfaction.     [name]

## 2018-09-19 NOTE — Anesthesia Postprocedure Evaluation (Signed)
Anesthesia Post Note  Patient: Phyllis House  Procedure(s) Performed: CYSTOSCOPY/RETROGRADE/URETEROSCOPY/HOLMIUM LASER/STENT PLACEMENT (Bilateral Ureter)     Patient location during evaluation: PACU Anesthesia Type: General Level of consciousness: awake and alert Pain management: pain level controlled Vital Signs Assessment: post-procedure vital signs reviewed and stable Respiratory status: spontaneous breathing, nonlabored ventilation and respiratory function stable Cardiovascular status: blood pressure returned to baseline and stable Postop Assessment: no apparent nausea or vomiting Anesthetic complications: no    Last Vitals:  Vitals:   09/19/18 1015 09/19/18 1030  BP: 134/83 128/73  Pulse: 69 70  Resp: 16 16  Temp:  36.4 C  SpO2: 99% 100%    Last Pain:  Vitals:   09/19/18 1030  TempSrc:   PainSc: 0-No pain                 Kaylyn Layer

## 2018-09-20 ENCOUNTER — Encounter (HOSPITAL_COMMUNITY): Payer: Self-pay | Admitting: Urology

## 2018-09-27 ENCOUNTER — Emergency Department (HOSPITAL_COMMUNITY): Payer: BC Managed Care – PPO

## 2018-09-27 ENCOUNTER — Encounter (HOSPITAL_COMMUNITY): Payer: Self-pay | Admitting: Emergency Medicine

## 2018-09-27 ENCOUNTER — Other Ambulatory Visit: Payer: Self-pay

## 2018-09-27 ENCOUNTER — Emergency Department (HOSPITAL_COMMUNITY)
Admission: EM | Admit: 2018-09-27 | Discharge: 2018-09-28 | Disposition: A | Discharge status: Home / Self Care | Payer: BC Managed Care – PPO | Attending: Emergency Medicine | Admitting: Emergency Medicine

## 2018-09-27 DIAGNOSIS — Z20828 Contact with and (suspected) exposure to other viral communicable diseases: Secondary | ICD-10-CM | POA: Diagnosis not present

## 2018-09-27 DIAGNOSIS — N201 Calculus of ureter: Secondary | ICD-10-CM | POA: Diagnosis not present

## 2018-09-27 DIAGNOSIS — R509 Fever, unspecified: Secondary | ICD-10-CM | POA: Diagnosis present

## 2018-09-27 DIAGNOSIS — Z79899 Other long term (current) drug therapy: Secondary | ICD-10-CM | POA: Insufficient documentation

## 2018-09-27 LAB — COMPREHENSIVE METABOLIC PANEL
ALT: 21 U/L (ref 0–44)
AST: 22 U/L (ref 15–41)
Albumin: 3.7 g/dL (ref 3.5–5.0)
Alkaline Phosphatase: 49 U/L (ref 38–126)
Anion gap: 9 (ref 5–15)
BUN: 10 mg/dL (ref 6–20)
CO2: 18 mmol/L — ABNORMAL LOW (ref 22–32)
Calcium: 8.2 mg/dL — ABNORMAL LOW (ref 8.9–10.3)
Chloride: 110 mmol/L (ref 98–111)
Creatinine, Ser: 0.98 mg/dL (ref 0.44–1.00)
GFR calc Af Amer: >60 mL/min (ref >60)
GFR calc non Af Amer: >60 mL/min (ref >60)
Glucose, Bld: 99 mg/dL (ref 70–99)
Potassium: 3.1 mmol/L — ABNORMAL LOW (ref 3.5–5.1)
Sodium: 137 mmol/L (ref 135–145)
Total Bilirubin: 0.2 mg/dL — ABNORMAL LOW (ref 0.3–1.2)
Total Protein: 6.9 g/dL (ref 6.5–8.1)

## 2018-09-27 LAB — URINALYSIS, ROUTINE W REFLEX MICROSCOPIC
Bilirubin Urine: NEGATIVE
Glucose, UA: NEGATIVE mg/dL
Ketones, ur: NEGATIVE mg/dL
Nitrite: NEGATIVE
Protein, ur: 30 mg/dL — AB
RBC / HPF: >50 RBC/hpf — ABNORMAL HIGH (ref 0–5)
Specific Gravity, Urine: 1.013 (ref 1.005–1.030)
pH: 7 (ref 5.0–8.0)

## 2018-09-27 LAB — CBC WITH DIFFERENTIAL/PLATELET
Abs Immature Granulocytes: 0.02 10*3/uL (ref 0.00–0.07)
Basophils Absolute: 0 10*3/uL (ref 0.0–0.1)
Basophils Relative: 1 %
Eosinophils Absolute: 0 10*3/uL (ref 0.0–0.5)
Eosinophils Relative: 0 %
HCT: 33.1 % — ABNORMAL LOW (ref 36.0–46.0)
Hemoglobin: 10.6 g/dL — ABNORMAL LOW (ref 12.0–15.0)
Immature Granulocytes: 1 %
Lymphocytes Relative: 16 %
Lymphs Abs: 0.7 10*3/uL (ref 0.7–4.0)
MCH: 25.2 pg — ABNORMAL LOW (ref 26.0–34.0)
MCHC: 32 g/dL (ref 30.0–36.0)
MCV: 78.6 fL — ABNORMAL LOW (ref 80.0–100.0)
Monocytes Absolute: 0.5 10*3/uL (ref 0.1–1.0)
Monocytes Relative: 11 %
Neutro Abs: 3 10*3/uL (ref 1.7–7.7)
Neutrophils Relative %: 71 %
Platelets: 113 10*3/uL — ABNORMAL LOW (ref 150–400)
RBC: 4.21 MIL/uL (ref 3.87–5.11)
RDW: 15.8 % — ABNORMAL HIGH (ref 11.5–15.5)
WBC: 4.2 10*3/uL (ref 4.0–10.5)
nRBC: 0 % (ref 0.0–0.2)

## 2018-09-27 LAB — I-STAT BETA HCG BLOOD, ED (MC, WL, AP ONLY): I-stat hCG, quantitative: <5 m[IU]/mL (ref <5)

## 2018-09-27 LAB — LACTIC ACID, PLASMA: Lactic Acid, Venous: 1.1 mmol/L (ref 0.5–1.9)

## 2018-09-27 LAB — SARS CORONAVIRUS 2 BY RT PCR (HOSPITAL ORDER, PERFORMED IN ~~LOC~~ HOSPITAL LAB): SARS Coronavirus 2: NEGATIVE

## 2018-09-27 MED ORDER — IOHEXOL 300 MG/ML  SOLN
100.0000 mL | Freq: Once | INTRAMUSCULAR | Status: AC | PRN
  Administered 2018-09-27: 100 mL via INTRAVENOUS

## 2018-09-27 MED ORDER — ONDANSETRON HCL 4 MG/2ML IJ SOLN
4.0000 mg | Freq: Once | INTRAMUSCULAR | Status: AC
  Administered 2018-09-27: 4 mg via INTRAVENOUS
  Filled 2018-09-27: qty 2

## 2018-09-27 MED ORDER — SODIUM CHLORIDE (PF) 0.9 % IJ SOLN
INTRAMUSCULAR | Status: AC
  Filled 2018-09-27: qty 50

## 2018-09-27 MED ORDER — LACTATED RINGERS IV BOLUS
1000.0000 mL | Freq: Once | INTRAVENOUS | Status: AC
  Administered 2018-09-27: 22:00:00 1000 mL via INTRAVENOUS

## 2018-09-27 NOTE — ED Triage Notes (Signed)
Pt c/o N/V post op bilateral urinary stent removal sent to ED by urologist d/t fever and N/V

## 2018-09-27 NOTE — ED Provider Notes (Signed)
[facility]-EMERGENCY DEPT Provider Note   CSN: [id number] Arrival date & time: 09/27/18  2041    History   Chief Complaint Chief Complaint  Patient presents with  . Fever    sent to ED by Dr Mena GoesEskridge  . Nausea    HPI Phyllis HarrisJennifer House is a 43 y.o. female.     43 year old female with past medical history including kidney stones, anxiety, migraines, PCOS who p/w fevers, nausea, and vomiting.  The patient had cystoscopy and stent placement for kidney stones on 5/21.  She had stents removed 2 days ago.  Since the stents were removed, she has developed high fevers at home that she has been trying to control with Tylenol and Motrin as well as severe nausea and some vomiting.  The vomiting became worse along with lower abdominal soreness w/ radiation to her back and she followed up with her urologist today.  In the clinic, she had nausea medications and several other IM shots.  She was told that she should come to the ED if her fevers did not improve.  She is continued to run high fevers at home.  She reports that her lower abdominal pain is a soreness that is made worse by vomiting episodes.  She denies any problems with urination or hematuria.  No diarrhea, cough, shortness of breath, or sick contacts.  The history is provided by the patient.  Fever    Past Medical History:  Diagnosis Date  . Anxiety   . Bilateral hydronephrosis   . Eczema   . H/O varicella   . History of herpes genitalis    LAST BREAK OUT 6 YRS AGO  . History of kidney stones   . Hypokalemia 2014   body cramping  . IUD (intrauterine device) in place   . Migraine   . Nephrolithiasis    bilateral-- per last renal u/s 07-27-2016  . Obese   . Polycystic ovarian syndrome   . Right ureteral stone   . Seasonal allergies     Patient Active Problem List   Diagnosis Date Noted  . Migraine without aura and without status migrainosus, not intractable 11/19/2013  . Other malaise and fatigue  03/14/2013  . Hypokalemia 12/26/2012  . Infection of urinary tract 07/02/2012  . Ureteral calculus, left 07/01/2012  . Fever, unspecified 07/01/2012    Past Surgical History:  Procedure Laterality Date  . CYSTOSCOPY W/ URETERAL STENT PLACEMENT Left 07/01/2012   Procedure: CYSTOSCOPY WITH RETROGRADE PYELOGRAM/URETERAL STENT PLACEMENT;  Surgeon: Milford Cageaniel Young Woodruff, MD;  Location: [facility];  Service: Urology;  Laterality: Left;  . CYSTOSCOPY W/ URETERAL STENT PLACEMENT Right 07/27/2016   Procedure: CYSTOSCOPY WITH RETROGRADE PYELOGRAM/URETERAL STENT PLACEMENT;  Surgeon: Alfredo MartinezScott MacDiarmid, MD;  Location: [facility];  Service: Urology;  Laterality: Right;  . CYSTOSCOPY WITH RETROGRADE PYELOGRAM, URETEROSCOPY AND STENT PLACEMENT Left 07/15/2012   Procedure: CYSTOSCOPY WITH left RETROGRADE PYELOGRAM,left  URETEROSCOPY AND left STENT exchange ;  Surgeon: Milford Cageaniel Young Woodruff, MD;  [facility] [place];  Service: Urology;  Laterality: Left;  LEFT URETER STENT EXCHANGE    . CYSTOSCOPY WITH RETROGRADE PYELOGRAM, URETEROSCOPY AND STENT PLACEMENT Right 08/11/2016   Procedure: CYSTOSCOPY WITH RIGHT RETROGRADE PYELOGRAM,RIGHT  URETEROSCOPY AND STENT CHANGE;  Surgeon: Ihor GullyMark Ottelin, MD;  [facility] [place];  Service: Urology;  Laterality: Right;  . CYSTOSCOPY/RETROGRADE/URETEROSCOPY/STONE EXTRACTION WITH BASKET  AGE 89  . CYSTOSCOPY/URETEROSCOPY/HOLMIUM LASER/STENT PLACEMENT Bilateral 09/19/2018   Procedure: CYSTOSCOPY/RETROGRADE/URETEROSCOPY/HOLMIUM LASER/STENT PLACEMENT;  Surgeon: Jerilee FieldEskridge, Matthew, MD;  [facility];  Service: Urology;  Laterality: Bilateral;  . HOLMIUM LASER APPLICATION Left 07/15/2012   Procedure: HOLMIUM LASER APPLICATION;  Surgeon: Milford Cage, MD;  Location: [facility];  Service: Urology;  Laterality: Left;  . HOLMIUM LASER APPLICATION Right 08/11/2016   Procedure: HOLMIUM LASER APPLICATION;  Surgeon: Ihor Gully, MD;  Location:  [facility];  Service: Urology;  Laterality: Right;  . TONSILLECTOMY  AS CHILD     OB History    Gravida  2   Para  2   Term  2   Preterm      AB      Living  2     SAB      TAB      Ectopic      Multiple      Live Births  2            Home Medications    Prior to Admission medications   Medication Sig Start Date End Date Taking? Authorizing Provider  acetaminophen (TYLENOL) 500 MG tablet Take 1,000 mg by mouth every 6 (six) hours as needed for moderate pain.   Yes [provider]  ciprofloxacin (CIPRO) 500 MG tablet Take 500 mg by mouth 2 (two) times daily. 09/27/18  Yes [provider]  dimenhyDRINATE (DRAMAMINE) 50 MG tablet Take 50 mg by mouth every 8 (eight) hours as needed for nausea.   Yes [provider]  ibuprofen (ADVIL) 200 MG tablet Take 400 mg by mouth every 6 (six) hours as needed for moderate pain.   Yes [provider]  levonorgestrel (MIRENA) 20 MCG/24HR IUD 1 each by Intrauterine route once. February 2014   Yes [provider]  ondansetron (ZOFRAN) 4 MG tablet Take 4 mg by mouth every 8 (eight) hours as needed for nausea or vomiting.   Yes [provider]  chlorpheniramine-HYDROcodone (TUSSIONEX PENNKINETIC ER) 10-8 MG/5ML SUER Take 5 mLs by mouth every 12 (twelve) hours as needed for cough. Patient not taking: Reported on 09/16/2018 06/25/18   Domenick Gong, MD  fluticasone [facility]) 50 MCG/ACT nasal spray Place 2 sprays into both nostrils daily. Patient not taking: Reported on 09/16/2018 06/25/18   Domenick Gong, MD  ibuprofen (ADVIL,MOTRIN) 600 MG tablet Take 1 tablet (600 mg total) by mouth every 6 (six) hours as needed. Patient not taking: Reported on 09/27/2018 06/25/18   Domenick Gong, MD    Family History Family History  Problem Relation Age of Onset  . Thyroid disease Mother   . Depression Mother   . Cancer Father        leaukemia  . Thyroid disease Brother    . Diabetes Maternal Aunt   . Cancer Maternal Grandmother        GI  . Diabetes Maternal Grandfather   . Heart disease Paternal Grandmother     Social History Social History   Tobacco Use  . Smoking status: Never Smoker  . Smokeless tobacco: Never Used  Substance Use Topics  . Alcohol use: Yes  . Drug use: No     Allergies   Adhesive [tape]   Review of Systems Review of Systems  Constitutional: Positive for fever.   All other systems reviewed and are negative except that which was mentioned in HPI   Physical Exam Updated Vital Signs BP 132/84 (BP Location: Left Arm)   Pulse 87   Temp 99.7 F (37.6 C) (Oral)   Resp 17   SpO2 100%   Physical Exam  Vitals signs and nursing note reviewed.  Constitutional:      General: She is not in acute distress.    Appearance: She is well-developed.  HENT:     Head: Normocephalic and atraumatic.     Nose: Nose normal.  Eyes:     Conjunctiva/sclera: Conjunctivae normal.  Neck:     Musculoskeletal: Neck supple.  Cardiovascular:     Rate and Rhythm: Normal rate and regular rhythm.     Heart sounds: Normal heart sounds. No murmur.  Pulmonary:     Effort: Pulmonary effort is normal.     Breath sounds: Normal breath sounds.  Abdominal:     General: Bowel sounds are normal. There is no distension.     Palpations: Abdomen is soft.     Tenderness: There is abdominal tenderness.     Comments: Tenderness across lower abdomen with no rebound or guarding  Skin:    General: Skin is warm and dry.  Neurological:     Mental Status: She is alert and oriented to person, place, and time.     Comments: Fluent speech  Psychiatric:        Judgment: Judgment normal.      ED Treatments / Results  Labs (all labs ordered are listed, but only abnormal results are displayed) Labs Reviewed  COMPREHENSIVE METABOLIC PANEL - Abnormal; Notable for the following components:      Result Value   Potassium 3.1 (*)    CO2 18 (*)    Calcium 8.2  (*)    Total Bilirubin 0.2 (*)    All other components within normal limits  CBC WITH DIFFERENTIAL/PLATELET - Abnormal; Notable for the following components:   Hemoglobin 10.6 (*)    HCT 33.1 (*)    MCV 78.6 (*)    MCH 25.2 (*)    RDW 15.8 (*)    Platelets 113 (*)    All other components within normal limits  URINALYSIS, ROUTINE W REFLEX MICROSCOPIC - Abnormal; Notable for the following components:   APPearance HAZY (*)    Hgb urine dipstick MODERATE (*)    Protein, ur 30 (*)    Leukocytes,Ua MODERATE (*)    RBC / HPF >50 (*)    Bacteria, UA RARE (*)    All other components within normal limits  SARS CORONAVIRUS 2 (HOSPITAL ORDER, PERFORMED IN [facility])  URINE CULTURE  CULTURE, BLOOD (ROUTINE X 2)  CULTURE, BLOOD (ROUTINE X 2)  LACTIC ACID, PLASMA  LACTIC ACID, PLASMA  I-STAT BETA HCG BLOOD, ED ([facility], [facility], [facility] ONLY)    EKG None  Radiology No results found.  Procedures Procedures (including critical care time)  Medications Ordered in ED Medications  sodium chloride (PF) 0.9 % injection (has no administration in time range)  ondansetron (ZOFRAN) injection 4 mg (4 mg Intravenous Given 09/27/18 2142)  lactated ringers bolus 1,000 mL (0 mLs Intravenous Stopped 09/27/18 2333)  iohexol (OMNIPAQUE) 300 MG/ML solution 100 mL (100 mLs Intravenous Contrast Given 09/27/18 2348)     Initial Impression / Assessment and Plan / ED Course  I have reviewed the triage vital signs and the nursing notes.  Pertinent labs & imaging results that were available during my care of the patient were reviewed by me and considered in my medical decision making (see chart for details).       Non-toxic on exam, hypertensive, T 100, remainder of VS reassuring. Mild tenderness across lower abdomen. DDx includes pyelonephritis, bacteremia from recent instrumentation, post-procedural complication  such as abscess or perforation.  I briefly discussed with urologist on-call, Dr. Liliane Shi, who  agreed with plan to obtain CT and recommended usual pyelonephritis treatment if CT is overall reassuring.  Her lab work shows normal WBC and normal creatinine.  Normal lactate.  CT is pending and I am signing out to the oncoming provider. Final Clinical Impressions(s) / ED Diagnoses   Final diagnoses:  None    ED Discharge Orders    None       Little, Ambrose Finland, MD 09/28/18 0002

## 2018-09-28 MED ORDER — FENTANYL CITRATE (PF) 100 MCG/2ML IJ SOLN
100.0000 ug | Freq: Once | INTRAMUSCULAR | Status: AC
  Administered 2018-09-28: 100 ug via INTRAVENOUS
  Filled 2018-09-28: qty 2

## 2018-09-28 MED ORDER — SODIUM CHLORIDE 0.9 % IV SOLN
1.0000 g | Freq: Once | INTRAVENOUS | Status: AC
  Administered 2018-09-28: 02:00:00 1 g via INTRAVENOUS
  Filled 2018-09-28: qty 10

## 2018-09-28 MED ORDER — HYDROCODONE-ACETAMINOPHEN 5-325 MG PO TABS: 1.0000 | ORAL_TABLET | ORAL | 0 refills | Status: DC | PRN

## 2018-09-28 MED ORDER — PROMETHAZINE HCL 25 MG RE SUPP: 25.0000 mg | Freq: Four times a day (QID) | RECTAL | 1 refills | Status: DC | PRN

## 2018-09-28 MED ORDER — ONDANSETRON HCL 4 MG/2ML IJ SOLN
4.0000 mg | Freq: Once | INTRAMUSCULAR | Status: AC
  Administered 2018-09-28: 4 mg via INTRAVENOUS
  Filled 2018-09-28: qty 2

## 2018-09-28 NOTE — ED Provider Notes (Addendum)
Nursing notes and vitals signs, including pulse oximetry, reviewed.  Summary of this visit's results, reviewed by myself:  EKG:  EKG Interpretation  Date/Time:    Ventricular Rate:    PR Interval:    QRS Duration:   QT Interval:    QTC Calculation:   R Axis:     Text Interpretation:         Labs:  Results for orders placed or performed during the hospital encounter of 09/27/18 (from the past 24 hour(s))  Comprehensive metabolic panel     Status: Abnormal   Collection Time: 09/27/18  9:37 PM  Result Value Ref Range   Sodium 137 135 - 145 mmol/L   Potassium 3.1 (L) 3.5 - 5.1 mmol/L   Chloride 110 98 - 111 mmol/L   CO2 18 (L) 22 - 32 mmol/L   Glucose, Bld 99 70 - 99 mg/dL   BUN 10 6 - 20 mg/dL   Creatinine, Ser 1.610.98 0.44 - 1.00 mg/dL   Calcium 8.2 (L) 8.9 - 10.3 mg/dL   Total Protein 6.9 6.5 - 8.1 g/dL   Albumin 3.7 3.5 - 5.0 g/dL   AST 22 15 - 41 U/L   ALT 21 0 - 44 U/L   Alkaline Phosphatase 49 38 - 126 U/L   Total Bilirubin 0.2 (L) 0.3 - 1.2 mg/dL   GFR calc non Af Amer >60 >60 mL/min   GFR calc Af Amer >60 >60 mL/min   Anion gap 9 5 - 15  CBC with Differential     Status: Abnormal   Collection Time: 09/27/18  9:37 PM  Result Value Ref Range   WBC 4.2 4.0 - 10.5 K/uL   RBC 4.21 3.87 - 5.11 MIL/uL   Hemoglobin 10.6 (L) 12.0 - 15.0 g/dL   HCT 09.633.1 (L) 04.536.0 - 40.946.0 %   MCV 78.6 (L) 80.0 - 100.0 fL   MCH 25.2 (L) 26.0 - 34.0 pg   MCHC 32.0 30.0 - 36.0 g/dL   RDW 81.115.8 (H) 91.411.5 - 78.215.5 %   Platelets 113 (L) 150 - 400 K/uL   nRBC 0.0 0.0 - 0.2 %   Neutrophils Relative % 71 %   Neutro Abs 3.0 1.7 - 7.7 K/uL   Lymphocytes Relative 16 %   Lymphs Abs 0.7 0.7 - 4.0 K/uL   Monocytes Relative 11 %   Monocytes Absolute 0.5 0.1 - 1.0 K/uL   Eosinophils Relative 0 %   Eosinophils Absolute 0.0 0.0 - 0.5 K/uL   Basophils Relative 1 %   Basophils Absolute 0.0 0.0 - 0.1 K/uL   Immature Granulocytes 1 %   Abs Immature Granulocytes 0.02 0.00 - 0.07 K/uL  Lactic acid, plasma      Status: None   Collection Time: 09/27/18  9:37 PM  Result Value Ref Range   Lactic Acid, Venous 1.1 0.5 - 1.9 mmol/L  SARS Coronavirus 2 (CEPHEID - Performed in [facility] lab), Hosp Order     Status: None   Collection Time: 09/27/18  9:40 PM  Result Value Ref Range   SARS Coronavirus 2 NEGATIVE NEGATIVE  I-Stat beta hCG blood, ED     Status: None   Collection Time: 09/27/18  9:43 PM  Result Value Ref Range   I-stat hCG, quantitative <5.0 <5 mIU/mL   Comment 3          Urinalysis, Routine w reflex microscopic     Status: Abnormal   Collection Time: 09/27/18 10:00 PM  Result Value Ref  Range   Color, Urine YELLOW YELLOW   APPearance HAZY (A) CLEAR   Specific Gravity, Urine 1.013 1.005 - 1.030   pH 7.0 5.0 - 8.0   Glucose, UA NEGATIVE NEGATIVE mg/dL   Hgb urine dipstick MODERATE (A) NEGATIVE   Bilirubin Urine NEGATIVE NEGATIVE   Ketones, ur NEGATIVE NEGATIVE mg/dL   Protein, ur 30 (A) NEGATIVE mg/dL   Nitrite NEGATIVE NEGATIVE   Leukocytes,Ua MODERATE (A) NEGATIVE   RBC / HPF >50 (H) 0 - 5 RBC/hpf   WBC, UA 21-50 0 - 5 WBC/hpf   Bacteria, UA RARE (A) NONE SEEN   Squamous Epithelial / LPF 11-20 0 - 5   Mucus PRESENT     Imaging Studies: Ct Abdomen Pelvis W Contrast  Result Date: 09/28/2018 CLINICAL DATA:  Nausea and vomiting. Fever. Nephrolithiasis. Recent bilateral ureteral stent removal. EXAM: CT ABDOMEN AND PELVIS WITH CONTRAST TECHNIQUE: Multidetector CT imaging of the abdomen and pelvis was performed using the standard protocol following bolus administration of intravenous contrast. CONTRAST:  OMNIPAQUE IOHEXOL 300 MG/ML  SOLN COMPARISON:  09/11/2018 FINDINGS: Lower Chest: No acute findings. Hepatobiliary: No hepatic masses identified. Gallbladder is unremarkable. Pancreas:  No mass or inflammatory changes. Spleen: Within normal limits in size and appearance. Adrenals/Urinary Tract: Bilateral renal medullary nephrocalcinosis with numerous small bilateral  intrarenal calculi again seen. A single 5 mm calculus is now seen in the distal right ureter with mild right hydronephrosis. No other ureteral calculi identified. Stomach/Bowel: No evidence of obstruction, inflammatory process or abnormal fluid collections. Vascular/Lymphatic: No pathologically enlarged lymph nodes. No abdominal aortic aneurysm. Reproductive: No mass or other significant abnormality. IUD seen in expected location in the uterus. Other:  None. Musculoskeletal:  No suspicious bone lesions identified. IMPRESSION: 1. Mild right hydroureteronephrosis due to 5 mm distal right ureteral calculus. 2. Bilateral renal medullary nephrocalcinosis, with numerous bilateral intrarenal calculi. Electronically Signed   By: Myles Rosenthal M.D.   On: 09/28/2018 00:40   1:25 AM Patient complaining of return of pain nausea.  Will re-medicate.  Will consult urology given patient's unexpected finding of an obstructing 5 mm right distal ureteral stone in the setting of fever.  Patient does not meet sepsis criteria.  1:37 AM Discussed with Dr. Liliane Shi of urology.  He would like patient to be started on antibiotics.  Patient given Rocephin in the ED and he would like her discharged on ciprofloxacin (patient was started on ciprofloxacin yesterday and has a week's supply).  He wishes to avoid another operation at this time but will have the patient return if she worsens over the weekend otherwise he will arrange for her to be seen Monday (the day after tomorrow) in the office.  2:56 AM Patient feeling better after IV medications.     Lukisha Procida, Jonny Ruiz, MD 09/28/18 1779    Paula Libra, MD 09/28/18 0300

## 2018-09-29 LAB — URINE CULTURE: Culture: <10000 — AB

## 2018-10-02 LAB — CULTURE, BLOOD (ROUTINE X 2): Culture: NO GROWTH

## 2018-10-03 LAB — CULTURE, BLOOD (ROUTINE X 2)
Culture: NO GROWTH
Special Requests: ADEQUATE

## 2018-10-17 ENCOUNTER — Other Ambulatory Visit: Payer: Self-pay | Admitting: Urology

## 2018-10-18 ENCOUNTER — Other Ambulatory Visit: Payer: Self-pay

## 2018-10-18 ENCOUNTER — Other Ambulatory Visit (HOSPITAL_COMMUNITY)
Admission: RE | Admit: 2018-10-18 | Discharge: 2018-10-18 | Disposition: A | Discharge status: Home / Self Care | Payer: BC Managed Care – PPO | Source: Ambulatory Visit | Attending: Urology | Admitting: Urology

## 2018-10-18 ENCOUNTER — Encounter (HOSPITAL_BASED_OUTPATIENT_CLINIC_OR_DEPARTMENT_OTHER): Payer: Self-pay | Admitting: *Deleted

## 2018-10-18 DIAGNOSIS — Z1159 Encounter for screening for other viral diseases: Secondary | ICD-10-CM | POA: Diagnosis present

## 2018-10-18 LAB — SARS CORONAVIRUS 2 (TAT 6-24 HRS): SARS Coronavirus 2: NEGATIVE

## 2018-10-18 NOTE — Progress Notes (Signed)
Spoke w/ pt via phone for pre-op interview.  Npo after mn. Arrive at 0900.  Needs urine preg.  Pt had covid test done today.

## 2018-10-18 NOTE — Progress Notes (Signed)

## 2018-10-21 NOTE — H&P (Signed)
Office Visit Report     10/07/2018   --------------------------------------------------------------------------------   Phyllis House  MRN: [id number]  PRIMARY CARE:  MEDICAID [place] [facility] [place]  DOB: 02-Sep-1975, 43 year old Female  REFERRING:    SSN: [id number]  PROVIDER:  Festus Aloe, M.D.    TREATING:  Azucena Fallen    LOCATION:  [facility] [phone]   --------------------------------------------------------------------------------   CC: I have kidney stones.  HPI: Phyllis House is a 43 year-old female established patient who is here for renal calculi.  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 Prior serum evaluation with PTH, calcium, uric acid and electrolytes was normal. Tried: HCTZ 61m- induced symptomatic hypokalemia despite being on Kcitrate. Also, Kcit 20 meq BID caused GI upset.   Right URS Apr 2018 with Dr. OKarsten Ro Repeat labs were done which included normal serum labs. 24-hour urine revealed very low volume, extreme hypocitraturia. Her urine Ca was normal. Urine pH 6.6 - 7.2.   She underwent bilateral ureteroscopy in 01/2018, with the right side being a staged procedure due to 2 large distal right ureteral stones, which were impacted. She did not follow up, as planned, after that procedure.   09/25/2018: She returned with r>L pain and CT 5/13/202 revealed several right distal stone and a small left distal stone. She was taken for bilateral URS 09/19/2018 and is here for stent removal.   09/27/2018: See phone note from 09/26/2018. Pt developed fever yesterday, Cipro sent in with instructions to take tylenol q 6hours alternating ibuprofen q6h as well. Dr EJunious Silkrequested she present this morning for evaluation with UA and abx administration (Rocephin).   Her last urine c/s was positive for 10k colonies of beta hemolytic strep  as well as mixed growth. Bilateral ureteral stents removed earlier in the week, she was given Cipro at time of procedure to cover prophylactically.   Pt presents with continued f/c, nausea with dry heaving here in clinic. Last took ibuprofen at 07:30. She has not started Cipro only recently picking up from the pharmacy prior to her appointment. No flank or lower back pain, some mlild s/p pressure only. Voiding with a good stream, no dysuria or gross hematuria. Fever did resolve last night when taking tylenol.   09/30/18: She was seen last week with above noted symptoms. She presented to the ED on 5/29 with progressive high fever. Temperature on arrival to the ED was noted to be 99.7. She had been alternating Tylenol and Motrin for fever management. Repeat CT imaging revealed an approximatly 4-5 mm distal right ureteral calculus with mild hydronephrosis. Culture from 5/29 at our office showed no growth and hospital culture showed insignificant growth. WBC- 4.2. lactic acid- 1.1. creatinine- 0.98. She was given Rocephin and d/c with continued use of Cipro. Today she returns for follow up. She states that she is feeling better today. She had a low grade fever yesterday morning, but has been afebrile since. She does continue to alternate Tylenol and Ibuprofen. She complains of intermittent RLQ pain and suprapubic pain. She also continues to have ongoing nausea. She has been using Zofran or Phenergan every 8 hours and this is currently manageable. She did note gross hematuria this morning. No clots or difficulties voiding. She remains on Cipro, as well. She denies seeing a stone pass.   10/07/18: She returns today for follow up. She completed course of Cipro as prescribed. Overall,  she states that symptoms have significantly improved. However, she has not seen a stone pass. She continues to have intermittent RLQ pain, which she notes more at night. This controlled with NSAIDs currently. She last used narcotic pain  medication or an antiemetic about 3 days ago. She denies current nausea or vomiting. She continues to have intermittent hematuria, but denies dysuria. She is voiding well. She denies intermittent fevers.   The problem is on both sides. She first stated noticing pain on approximately 12/31/2006. This is not her first kidney stone. She is currently having flank pain, groin pain, and nausea. She denies having back pain, vomiting, fever, and chills. She has caught a stone in her urine strainer since her symptoms began.   She has had ureteral stent and ureteroscopy for treatment of her stones in the past.     ALLERGIES: No Allergies    MEDICATIONS: Advil  Aleve  Indapamide 1.25 mg tablet 1 tablet PO Daily     GU PSH: Cysto Remove Stent FB Sim - 09/25/2018 Cysto Uretero Lithotripsy - 2014 Cystoscopy And Treatment, Right - 02/22/2018, 2008 Cystoscopy Insert Stent, Right - 02/22/2018, Left - 02/08/2018, Right - 2018, 2014, 2014 Ureteroscopic laser litho, Bilateral - 09/19/2018, Left - 02/22/2018, Right - 02/08/2018, Right - 2018 Ureteroscopic stone removal, Right - 2018      PSH Notes: Cystoscopy With Ureteroscopy With Lithotripsy, Cystoscopy With Insertion Of Ureteral Stent Left, Cystoscopy With Insertion Of Ureteral Stent Left, Cystoscopy With Manipulation Of Ureteral Calculus   NON-GU PSH: None   GU PMH: Renal calculus - 09/30/2018, - 09/27/2018, - 09/25/2018, - 09/11/2018, - 01/16/2017, Nephrolithiasis, - 2015 Acute Cystitis/UTI - 09/27/2018 Flank Pain - 09/11/2018 Gross hematuria - 09/11/2018 LLQ pain - 09/11/2018 RLQ pain - 09/11/2018 Mixed incontinence, Mixed stress and urge urinary incontinence - 2015 Urinary Tract Inf, Unspec site, Urinary tract infection - 2015, Pyuria, - 2015 Hydronephrosis Unspec, Hydronephrosis On The Right - 2014 Ureteral calculus, Ureteral Stone - 2014    NON-GU PMH: Bacteriuria - 2018 Encounter for general adult medical examination without abnormal findings,  Encounter for preventive health examination - 2015 Hypokalemia, Hypokalemia - 2015 Hypercalciuria, Hypercalciuria - 2014 Personal history of other diseases of the nervous system and sense organs, History of migraine headaches - 2014 Personal history of other specified conditions, History of nausea - 2014    FAMILY HISTORY: Family Health Status Number - Runs In Family Father Deceased At [place] ___ - Runs In Family nephrolithiasis - Uncle, Mother, Grandfather, Aunt   SOCIAL HISTORY: Marital Status: Single Preferred Language: English; Ethnicity: Not Hispanic Or Latino; Race: White Current Smoking Status: Patient has never smoked.   Tobacco Use Assessment Completed: Used Tobacco in last 30 days? Has never drank.  Drinks 3 caffeinated drinks per day. 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     Notes: Never A Smoker, Drug Use, Caffeine Use, Marital History - Single, Alcohol Use   REVIEW OF SYSTEMS:    GU Review Female:   Patient denies frequent urination, hard to postpone urination, burning /pain with urination, get up at night to urinate, leakage of urine, stream starts and stops, trouble starting your stream, have to strain to urinate, and being pregnant.  Gastrointestinal (Upper):   Patient denies nausea, indigestion/ heartburn, and vomiting.  Gastrointestinal (Lower):   Patient denies diarrhea and constipation.  Constitutional:   Patient denies fever, night sweats, weight loss, and fatigue.  Skin:   Patient denies skin rash/ lesion and itching.  Eyes:   Patient denies  blurred vision and double vision.  Ears/ Nose/ Throat:   Patient denies sore throat and sinus problems.  Hematologic/Lymphatic:   Patient denies swollen glands and easy bruising.  Cardiovascular:   Patient denies leg swelling and chest pains.  Respiratory:   Patient denies cough and shortness of breath.  Endocrine:   Patient denies excessive thirst.  Musculoskeletal:   Patient denies back pain and joint pain.   Neurological:   Patient denies headaches and dizziness.  Psychologic:   Patient denies depression and anxiety.   VITAL SIGNS:      10/07/2018 09:39 AM  Weight 180 lb / 81.65 kg  BP 115/78 mmHg  Pulse 67 /min  Temperature 98.8 F / 37.1 C   MULTI-SYSTEM PHYSICAL EXAMINATION:    Constitutional: Well-nourished. No physical deformities. Normally developed. Good grooming.   Respiratory: No labored breathing, no use of accessory muscles.   Cardiovascular: Normal temperature, normal extremity pulses, no swelling, no varicosities.  Neurologic / Psychiatric: Oriented to time, oriented to place, oriented to person. No depression, no anxiety, no agitation.  Gastrointestinal: No mass, no tenderness, no rigidity, non obese abdomen. No CVAT.   Musculoskeletal: Normal gait and station of head and neck.     PAST DATA REVIEWED:  Source Of History:  Patient  Records Review:   Previous Patient Records  Urine Test Review:   Urinalysis, Urine Culture  Urodynamics Review:   Review Bladder Scan  X-Ray Review: C.T. Abdomen/Pelvis: Reviewed Films. Reviewed Report.     PROCEDURES:         KUB - [id number]  A single view of the abdomen is obtained. 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 No obvious opacities noted along the expected anatomical course of the left ureter. Along the course of the right distal ureter in the vicinity of the right UVJ there continues to be an approximatly 4 mm calcification. This does not appear to have progressed when compared to recent CT imaging. IUD noted. Normal appearance of bowel gas pattern.       Patient confirmed No Neulasta OnPro Device.            Urinalysis w/Scope Dipstick Dipstick Cont'd Micro  Color: Yellow Bilirubin: Neg mg/dL WBC/hpf: 0 - 5/hpf  Appearance: Cloudy Ketones: Neg mg/dL RBC/hpf: 40 - 60/hpf  Specific Gravity: 1.015 Blood: 3+ ery/uL Bacteria: Rare (0-9/hpf)  pH: 7.0 Protein: Trace  mg/dL Cystals: NS (Not Seen)  Glucose: Neg mg/dL Urobilinogen: 0.2 mg/dL Casts: NS (Not Seen)    Nitrites: Neg Trichomonas: Not Present    Leukocyte Esterase: 1+ leu/uL Mucous: Not Present      Epithelial Cells: NS (Not Seen)      Yeast: NS (Not Seen)      Sperm: Not Present    ASSESSMENT:      ICD-10 Details  1 GU:   Renal calculus - N20.0    PLAN:            Medications Stop Meds: Hydrocodone-Acetaminophen 5 mg-325 mg tablet 1 tablet PO Q 6 H PRN  Start: 09/11/2018  Discontinue: 10/07/2018  - Reason: The medication cycle was completed.            Orders Labs Urine Culture, Urine Preg.          Document Letter(s):  Created for Patient: Clinical Summary         Notes:   Urinalysis continues to show hematuria, but does not look overtly concerning for infectious process. I will repeat urine  culture. 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 No obvious opacities noted along the expected anatomical course of the left ureter. Along the course of the right distal ureter in the vicinity of the right UVJ there continues to be an approximatly 4 mm calcification. This does not appear to have progressed when compared to recent CT imaging. We reviewed treatment options in detail. She would like to consider repeat intervention with URS. We reviewed indications and potential risks in detail today. I will review with her urologist and keep her informed of recommendations moving forward. She will remain on MET at this time. I encouraged that she continue to hydrate well. Strict return precautions reviewed in the interim for fever or progressive pain, nausea, or vomiting.   ** Reviewed today's imaging study with Dr. Junious Silk. Will plan to proceed with right URS.**         Next Appointment:      Next Appointment: 11/04/2018 08:30 AM    Appointment Type: Renal Ultrasound    Location: [facility] [phone]    Provider: Radiology Rm1 Radiology Rm 1    Reason for Visit: 6 week RUS/ESK      * Signed by Azucena Fallen on 10/07/18 at 3:50 PM (EDT)*     The information contained in this medical record document is considered private and confidential patient information. This information can only be used for the medical diagnosis and/or medical services that are being provided by the patient's selected caregivers. This information can only be distributed outside of the patient's care if the patient agrees and signs waivers of authorization for this information to be sent to an outside source or route.   Addendum: Her urine culture was a low count of the cyst and beta hemolytic strep.  I discussed the patient with nurse practitioner Ronal Fear and agree with her assessment and plan.  The patient had a very difficult ureteroscopy last time and if we're able I'll try to get proximally and clear the kidney, but many of those areas may be plaques.

## 2018-10-22 ENCOUNTER — Encounter (HOSPITAL_BASED_OUTPATIENT_CLINIC_OR_DEPARTMENT_OTHER)
Admission: RE | Disposition: A | Discharge status: Home / Self Care | Payer: Self-pay | Source: Home / Self Care | Attending: Urology

## 2018-10-22 ENCOUNTER — Ambulatory Visit (HOSPITAL_BASED_OUTPATIENT_CLINIC_OR_DEPARTMENT_OTHER)
Admission: RE | Admit: 2018-10-22 | Discharge: 2018-10-22 | Disposition: A | Discharge status: Home / Self Care | Payer: BC Managed Care – PPO | Attending: Urology | Admitting: Urology

## 2018-10-22 ENCOUNTER — Encounter (HOSPITAL_BASED_OUTPATIENT_CLINIC_OR_DEPARTMENT_OTHER): Payer: Self-pay

## 2018-10-22 ENCOUNTER — Ambulatory Visit (HOSPITAL_BASED_OUTPATIENT_CLINIC_OR_DEPARTMENT_OTHER): Payer: BC Managed Care – PPO | Admitting: Anesthesiology

## 2018-10-22 DIAGNOSIS — Z791 Long term (current) use of non-steroidal anti-inflammatories (NSAID): Secondary | ICD-10-CM | POA: Diagnosis not present

## 2018-10-22 DIAGNOSIS — Z6830 Body mass index (BMI) 30.0-30.9, adult: Secondary | ICD-10-CM | POA: Insufficient documentation

## 2018-10-22 DIAGNOSIS — E876 Hypokalemia: Secondary | ICD-10-CM | POA: Diagnosis not present

## 2018-10-22 DIAGNOSIS — Z87442 Personal history of urinary calculi: Secondary | ICD-10-CM | POA: Diagnosis not present

## 2018-10-22 DIAGNOSIS — F419 Anxiety disorder, unspecified: Secondary | ICD-10-CM | POA: Diagnosis not present

## 2018-10-22 DIAGNOSIS — E669 Obesity, unspecified: Secondary | ICD-10-CM | POA: Insufficient documentation

## 2018-10-22 DIAGNOSIS — R51 Headache: Secondary | ICD-10-CM | POA: Insufficient documentation

## 2018-10-22 DIAGNOSIS — N132 Hydronephrosis with renal and ureteral calculous obstruction: Secondary | ICD-10-CM | POA: Diagnosis present

## 2018-10-22 DIAGNOSIS — N201 Calculus of ureter: Secondary | ICD-10-CM

## 2018-10-22 DIAGNOSIS — N200 Calculus of kidney: Secondary | ICD-10-CM

## 2018-10-22 HISTORY — PX: CYSTOSCOPY/URETEROSCOPY/HOLMIUM LASER/STENT PLACEMENT: SHX6546

## 2018-10-22 LAB — POCT PREGNANCY, URINE: Preg Test, Ur: NEGATIVE

## 2018-10-22 SURGERY — CYSTOSCOPY/URETEROSCOPY/HOLMIUM LASER/STENT PLACEMENT
Anesthesia: General | Site: Ureter | Laterality: Right

## 2018-10-22 MED ORDER — MIDAZOLAM HCL 2 MG/2ML IJ SOLN
INTRAMUSCULAR | Status: AC
  Filled 2018-10-22: qty 2

## 2018-10-22 MED ORDER — MIDAZOLAM HCL 5 MG/5ML IJ SOLN
INTRAMUSCULAR | Status: DC | PRN
  Administered 2018-10-22: 2 mg via INTRAVENOUS

## 2018-10-22 MED ORDER — SCOPOLAMINE 1 MG/3DAYS TD PT72
1.0000 | MEDICATED_PATCH | TRANSDERMAL | Status: DC
  Administered 2018-10-22: 1.5 mg via TRANSDERMAL
  Filled 2018-10-22: qty 1

## 2018-10-22 MED ORDER — PROPOFOL 10 MG/ML IV BOLUS
INTRAVENOUS | Status: AC
  Filled 2018-10-22: qty 20

## 2018-10-22 MED ORDER — KETOROLAC TROMETHAMINE 30 MG/ML IJ SOLN
INTRAMUSCULAR | Status: AC
  Filled 2018-10-22: qty 1

## 2018-10-22 MED ORDER — KETOROLAC TROMETHAMINE 30 MG/ML IJ SOLN
INTRAMUSCULAR | Status: DC | PRN
  Administered 2018-10-22: 30 mg via INTRAVENOUS

## 2018-10-22 MED ORDER — ONDANSETRON HCL 4 MG/2ML IJ SOLN
INTRAMUSCULAR | Status: DC | PRN
  Administered 2018-10-22: 4 mg via INTRAVENOUS

## 2018-10-22 MED ORDER — ONDANSETRON HCL 4 MG/2ML IJ SOLN
INTRAMUSCULAR | Status: AC
  Filled 2018-10-22: qty 2

## 2018-10-22 MED ORDER — DEXAMETHASONE SODIUM PHOSPHATE 10 MG/ML IJ SOLN
INTRAMUSCULAR | Status: DC | PRN
  Administered 2018-10-22: 5 mg via INTRAVENOUS

## 2018-10-22 MED ORDER — FENTANYL CITRATE (PF) 100 MCG/2ML IJ SOLN
25.0000 ug | INTRAMUSCULAR | Status: DC | PRN
  Filled 2018-10-22: qty 1

## 2018-10-22 MED ORDER — LIDOCAINE 2% (20 MG/ML) 5 ML SYRINGE
INTRAMUSCULAR | Status: AC
  Filled 2018-10-22: qty 5

## 2018-10-22 MED ORDER — PROMETHAZINE HCL 25 MG/ML IJ SOLN
6.2500 mg | INTRAMUSCULAR | Status: DC | PRN
  Filled 2018-10-22: qty 1

## 2018-10-22 MED ORDER — SCOPOLAMINE 1 MG/3DAYS TD PT72
MEDICATED_PATCH | TRANSDERMAL | Status: AC
  Filled 2018-10-22: qty 1

## 2018-10-22 MED ORDER — PROPOFOL 10 MG/ML IV BOLUS
INTRAVENOUS | Status: DC | PRN
  Administered 2018-10-22: 200 mg via INTRAVENOUS

## 2018-10-22 MED ORDER — SODIUM CHLORIDE 0.9 % IR SOLN
Status: DC | PRN
  Administered 2018-10-22: 3000 mL

## 2018-10-22 MED ORDER — FENTANYL CITRATE (PF) 100 MCG/2ML IJ SOLN
INTRAMUSCULAR | Status: DC | PRN
  Administered 2018-10-22 (×2): 50 ug via INTRAVENOUS

## 2018-10-22 MED ORDER — CEFAZOLIN SODIUM-DEXTROSE 2-4 GM/100ML-% IV SOLN
2.0000 g | Freq: Once | INTRAVENOUS | Status: AC
  Administered 2018-10-22: 2 g via INTRAVENOUS
  Filled 2018-10-22: qty 100

## 2018-10-22 MED ORDER — IOHEXOL 300 MG/ML  SOLN
INTRAMUSCULAR | Status: DC | PRN
  Administered 2018-10-22: 8 mL via URETHRAL

## 2018-10-22 MED ORDER — FENTANYL CITRATE (PF) 100 MCG/2ML IJ SOLN
INTRAMUSCULAR | Status: AC
  Filled 2018-10-22: qty 2

## 2018-10-22 MED ORDER — LACTATED RINGERS IV SOLN
INTRAVENOUS | Status: DC
  Administered 2018-10-22: 10:00:00 via INTRAVENOUS
  Filled 2018-10-22: qty 1000

## 2018-10-22 MED ORDER — DEXAMETHASONE SODIUM PHOSPHATE 10 MG/ML IJ SOLN
INTRAMUSCULAR | Status: AC
  Filled 2018-10-22: qty 2

## 2018-10-22 MED ORDER — OXYCODONE HCL 5 MG PO TABS
5.0000 mg | ORAL_TABLET | Freq: Once | ORAL | Status: DC | PRN
  Filled 2018-10-22: qty 1

## 2018-10-22 MED ORDER — OXYCODONE HCL 5 MG/5ML PO SOLN
5.0000 mg | Freq: Once | ORAL | Status: DC | PRN
  Filled 2018-10-22: qty 5

## 2018-10-22 MED ORDER — CEFAZOLIN SODIUM-DEXTROSE 2-4 GM/100ML-% IV SOLN
INTRAVENOUS | Status: AC
  Filled 2018-10-22: qty 100

## 2018-10-22 MED ORDER — LIDOCAINE 2% (20 MG/ML) 5 ML SYRINGE
INTRAMUSCULAR | Status: DC | PRN
  Administered 2018-10-22: 80 mg via INTRAVENOUS

## 2018-10-22 MED ORDER — ONDANSETRON 4 MG PO TBDP: 4.0000 mg | ORAL_TABLET | Freq: Three times a day (TID) | ORAL | 0 refills | Status: DC | PRN

## 2018-10-22 SURGICAL SUPPLY — 24 items
BAG DRAIN URO-CYSTO SKYTR STRL (DRAIN) ×3 IMPLANT
BAG DRN UROCATH (DRAIN) ×1
BASKET ZERO TIP NITINOL 2.4FR (BASKET) ×2 IMPLANT
BSKT STON RTRVL ZERO TP 2.4FR (BASKET) ×1
CATH URET 5FR 28IN CONE TIP (BALLOONS)
CATH URET 5FR 28IN OPEN ENDED (CATHETERS) ×2 IMPLANT
CATH URET 5FR 70CM CONE TIP (BALLOONS) IMPLANT
CATH URET DUAL LUMEN 6-10FR 50 (CATHETERS) IMPLANT
CLOTH BEACON ORANGE TIMEOUT ST (SAFETY) ×3 IMPLANT
FIBER LASER FLEXIVA 365 (UROLOGICAL SUPPLIES) ×2 IMPLANT
GLOVE BIO SURGEON STRL SZ7.5 (GLOVE) ×3 IMPLANT
GOWN STRL REUS W/TWL LRG LVL3 (GOWN DISPOSABLE) ×3 IMPLANT
GUIDEWIRE ANG ZIPWIRE 038X150 (WIRE) ×2 IMPLANT
GUIDEWIRE STR DUAL SENSOR (WIRE) ×3 IMPLANT
IV NS IRRIG 3000ML ARTHROMATIC (IV SOLUTION) ×6 IMPLANT
KIT TURNOVER CYSTO (KITS) ×3 IMPLANT
MANIFOLD NEPTUNE II (INSTRUMENTS) ×2 IMPLANT
NS IRRIG 500ML POUR BTL (IV SOLUTION) ×1 IMPLANT
PACK CYSTO (CUSTOM PROCEDURE TRAY) ×3 IMPLANT
SHEATH URETERAL 12FRX28CM (UROLOGICAL SUPPLIES) ×2 IMPLANT
STENT URET 6FRX24 CONTOUR (STENTS) ×2 IMPLANT
TUBE CONNECTING 12'X1/4 (SUCTIONS) ×1
TUBE CONNECTING 12X1/4 (SUCTIONS) ×1 IMPLANT
TUBING UROLOGY SET (TUBING) ×2 IMPLANT

## 2018-10-22 NOTE — Anesthesia Postprocedure Evaluation (Signed)
Anesthesia Post Note  Patient: Phyllis House  Procedure(s) Performed: CYSTOSCOPY/RETROGRADE/URETEROSCOPY/HOLMIUM LASER/STENT PLACEMENT (Right Ureter)     Patient location during evaluation: PACU Anesthesia Type: General Level of consciousness: awake and alert Pain management: pain level controlled Vital Signs Assessment: post-procedure vital signs reviewed and stable Respiratory status: spontaneous breathing, nonlabored ventilation and respiratory function stable Cardiovascular status: blood pressure returned to baseline and stable Postop Assessment: no apparent nausea or vomiting Anesthetic complications: no    Last Vitals:  Vitals:   10/22/18 1245 10/22/18 1300  BP: 110/70   Pulse: 64   Resp: 15   Temp:  36.8 C  SpO2: 100%     Last Pain:  Vitals:   10/22/18 1300  TempSrc:   PainSc: 0-No pain                 Audry Pili

## 2018-10-22 NOTE — Transfer of Care (Signed)
  Last Vitals:  Vitals Value Taken Time  BP 110/67 10/22/18 1208  Temp    Pulse 69 10/22/18 1211  Resp 15 10/22/18 1211  SpO2 100 % 10/22/18 1211  Vitals shown include unvalidated device data.  Last Pain:  Vitals:   10/22/18 0848  TempSrc: Oral  PainSc: 0-No pain      Patients Stated Pain Goal: 4 (10/22/18 0848) Immediate Anesthesia Transfer of Care Note  Patient: Phyllis House  Procedure(s) Performed: Procedure(s) (LRB): CYSTOSCOPY/RETROGRADE/URETEROSCOPY/HOLMIUM LASER/STENT PLACEMENT (Right)  Patient Location: PACU  Anesthesia Type: General  Level of Consciousness: awake, alert  and oriented  Airway & Oxygen Therapy: Patient Spontanous Breathing and Patient connected to nasal cannula oxygen  Post-op Assessment: Report given to PACU RN and Post -op Vital signs reviewed and stable  Post vital signs: Reviewed and stable  Complications: No apparent anesthesia complications

## 2018-10-22 NOTE — Anesthesia Preprocedure Evaluation (Addendum)
Anesthesia Evaluation  Patient identified by MRN, date of birth, ID band Patient awake    Reviewed: Allergy & Precautions, NPO status , Patient's Chart, lab work & pertinent test results  History of Anesthesia Complications Negative for: history of anesthetic complications  Airway Mallampati: I  TM Distance: >3 FB Neck ROM: Full    Dental  (+) Dental Advisory Given, Chipped,    Pulmonary neg pulmonary ROS,    breath sounds clear to auscultation       Cardiovascular negative cardio ROS   Rhythm:Regular Rate:Normal     Neuro/Psych  Headaches, PSYCHIATRIC DISORDERS Anxiety    GI/Hepatic negative GI ROS, Neg liver ROS,   Endo/Other   Obesity   Renal/GU  Kidney stones      Musculoskeletal negative musculoskeletal ROS (+)   Abdominal   Peds  Hematology negative hematology ROS (+)   Anesthesia Other Findings HSV  Reproductive/Obstetrics  PCOS                             Anesthesia Physical Anesthesia Plan  ASA: II  Anesthesia Plan: General   Post-op Pain Management:    Induction: Intravenous  PONV Risk Score and Plan: 4 or greater and Treatment may vary due to age or medical condition, Ondansetron, Scopolamine patch - Pre-op, Dexamethasone and Midazolam  Airway Management Planned: LMA  Additional Equipment: None  Intra-op Plan:   Post-operative Plan: Extubation in OR  Informed Consent: I have reviewed the patients History and Physical, chart, labs and discussed the procedure including the risks, benefits and alternatives for the proposed anesthesia with the patient or authorized representative who has indicated his/her understanding and acceptance.     Dental advisory given  Plan Discussed with: CRNA and Anesthesiologist  Anesthesia Plan Comments:        Anesthesia Quick Evaluation

## 2018-10-22 NOTE — Op Note (Signed)
Preoperative diagnosis: Right ureteral stone, right renal stones postoperative diagnosis: Same  Procedure: Cystoscopy, right retrograde pyelogram, right ureteroscopy laser lithotripsy, stone basket extraction and ureteral stent placement  Surgeon: Junious Silk  Anesthesia: General  Indication for procedure: 43 year old with symptomatic right distal stone.  She is a chronic stone former and we tried her on hydrochlorothiazide and now indapamide but she cannot tolerate it.  She had nausea and fatigue and stopped indapamide.  Findings: The bladder and urethra were unremarkable no stone or foreign body in the bladder.  Right retrograde pyelogram-this outlined a filling defect in the distal ureter consistent with the stone with very mild proximal dilation.  On right ureteroscopy there was a stone in the right distal ureter, I was also able to laser stones in the right lower pole, right midpole and right upper pole.  These were dusted.  If you were extracted noted only be 1 to 2 mm.  In the kidney the renal papilla have multiple Randall's plaques with deep embedded stones and stone crystals.  Description of procedure: After consent was obtained patient brought to the operating room.  After adequate anesthesia she was placed in lithotomy position and prepped and draped in the usual sterile fashion.  A timeout was performed to confirm the patient and procedure.  The cystoscope was passed per urethra and the bladder inspected.  The right ureteral orifice was cannulated with a 5 [place] open-ended catheter and retrograde injection of contrast was performed.  A sensor wire was then advanced to the collecting system.  The 4.5 French semirigid ureteroscope was advanced into the right ureter where the stone was located.  It was fragmented with a 365 m laser fiber and the pieces sequentially dropped in the bladder with a 0 tip basket.  Further ureteroscopy up into the proximal ureter noted the ureter to be normal  without injury, stone fragment or stricture.  Under direct vision I passed a Glidewire.  The scope was backed out and a medium access sheath advanced.  Dual channel digital ureteroscope was advanced to the kidney where stones were noted in the upper mid and lower pole.  They were fragmented a few of the pieces were removed with a 0 tip basket and noted to be 1 to 2 mm.  The renal papilla are friable from all the calcifications and crystals which made visualization a little bit difficult but I did not feel like there were any other large stones or stone fragments.  The collecting system was inspected one more time, UPJ noted to be normal and then the access sheath was backed out over the scope and the ureter inspected on the way out noted to have no stone fragment, injury or stricture.  The Glidewire was backloaded on the cystoscope and a 624 cm stent advanced.  A good coil was noted in the kidney and a good coil in the bladder with wire removal.  The string was shortened and then tucked in the vagina.  She was awakened and taken to the recovery room in stable condition.  Complications: None  Blood loss: Minimal  Specimens to office lab: Stone fragments  Drains: 6 x 24 cm stent on the right with a string  Disposition: Patient stable to PACU

## 2018-10-22 NOTE — Interval H&P Note (Signed)
History and Physical Interval Note:  10/22/2018 10:56 AM  Phyllis House  has presented today for surgery, with the diagnosis of RIGHT URETERAL CALCULUS.  The various methods of treatment have been discussed with the patient and family. After consideration of risks, benefits and other options for treatment, the patient has consented to  Procedure(s) with comments: CYSTOSCOPY/RETROGRADE/URETEROSCOPY/HOLMIUM LASER/STENT PLACEMENT (Right) - ONLY NEEDS 60 MIN as a surgical intervention.  The patient's history has been reviewed, patient examined, no change in status, stable for surgery.  I have reviewed the patient's chart and labs.  Questions were answered to the patient's satisfaction.  She has not seen a stone pass but had RLQ pain and gross hematuria. No fever. Her right ureter was very tight last month and discussed she may need a staged procedure. If so, we can stent both sides and do bilateral URS. She cannot tolerate indapamide - nausea and weakness.    Festus Aloe

## 2018-10-22 NOTE — Anesthesia Procedure Notes (Addendum)
Procedure Name: LMA Insertion Date/Time: 10/22/2018 11:08 AM Performed by: Audry Pili, MD Pre-anesthesia Checklist: Patient identified, Emergency Drugs available, Suction available and Patient being monitored Patient Re-evaluated:Patient Re-evaluated prior to induction Oxygen Delivery Method: Circle system utilized Preoxygenation: Pre-oxygenation with 100% oxygen Induction Type: IV induction Ventilation: Mask ventilation without difficulty LMA: LMA inserted LMA Size: 4.0 Number of attempts: 1 Airway Equipment and Method: Bite block Placement Confirmation: positive ETCO2 Tube secured with: Tape Dental Injury: Teeth and Oropharynx as per pre-operative assessment

## 2018-10-22 NOTE — Discharge Instructions (Signed)
Ureteral Stent Implantation, Care After Refer to this sheet in the next few weeks. These instructions provide you with information about caring for yourself after your procedure. Your health care provider may also give you more specific instructions. Your treatment has been planned according to current medical practices, but problems sometimes occur. Call your health care provider if you have any problems or questions after your procedure. What can I expect after the procedure? After the procedure, it is common to have:  Nausea.  Mild pain when you urinate. You may feel this pain in your lower back or lower abdomen. Pain should stop within a few minutes after you urinate. This may last for up to 1 week.  A small amount of blood in your urine for several days. Follow these instructions at home:  Medicines  Take over-the-counter and prescription medicines only as told by your health care provider.  If you were prescribed an antibiotic medicine, take it as told by your health care provider. Do not stop taking the antibiotic even if you start to feel better.  Do not drive for 24 hours if you received a sedative.  Do not drive or operate heavy machinery while taking prescription pain medicines. Activity  Return to your normal activities as told by your health care provider. Ask your health care provider what activities are safe for you.  Do not lift anything that is heavier than 10 lb (4.5 kg). Follow this limit for 1 week after your procedure, or for as long as told by your health care provider. General instructions  Watch for any blood in your urine. Call your health care provider if the amount of blood in your urine increases.  If you have a catheter: ? Follow instructions from your health care provider about taking care of your catheter and collection bag. ? Do not take baths, swim, or use a hot tub until your health care provider approves.  Drink enough fluid to keep your urine  clear or pale yellow.  Keep all follow-up visits as told by your health care provider. This is important. Contact a health care provider if:  You have pain that gets worse or does not get better with medicine, especially pain when you urinate.  You have difficulty urinating.  You feel nauseous or you vomit repeatedly during a period of more than 2 days after the procedure. Get help right away if:  Your urine is dark red or has blood clots in it.  You are leaking urine (have incontinence).  The end of the stent comes out of your urethra.  You cannot urinate.  You have sudden, sharp, or severe pain in your abdomen or lower back.  You have a fever. This information is not intended to replace advice given to you by your health care provider. Make sure you discuss any questions you have with your health care provider. Document Released: 12/18/2012 Document Revised: 09/23/2015 Document Reviewed: 10/30/2014 Elsevier Interactive Patient Education  2019 [place] CARE INSTRUCTIONS  Activity: Rest for the remainder of the day.  Do not drive or operate equipment today.  You may resume normal activities in one to two days as instructed by your physician.   Meals: Drink plenty of liquids and eat light foods such as gelatin or soup this evening.  You may return to a normal meal plan tomorrow.  Return to Work: You may return to work in one to two days or as instructed by your physician.  Special  Instructions / Symptoms: Call your physician if any of these symptoms occur:   -persistent or heavy bleeding  -bleeding which continues after first few urination  -large blood clots that are difficult to pass  -urine stream diminishes or stops completely  -fever equal to or higher than 101 degrees Farenheit.  -cloudy urine with a strong, foul odor  -severe pain  Females should always wipe from front to back after elimination.  You may feel some burning pain when you  urinate.  This should disappear with time.  Applying moist heat to the lower abdomen or a hot tub bath may help relieve the pain. \  Follow-Up / Date of Return Visit to Your Physician:  As instructed Call for an appointment to arrange follow-up.  Patient Signature:  ________________________________________________________  Nurse's Signature:  ________________________________________________________  Post Anesthesia Home Care Instructions  Activity: Get plenty of rest for the remainder of the day. A responsible individual must stay with you for 24 hours following the procedure.  For the next 24 hours, DO NOT: -Drive a car -Advertising copywriterperate machinery -Drink alcoholic beverages -Take any medication unless instructed by your physician -Make any legal decisions or sign important papers.  Meals: Start with liquid foods such as gelatin or soup. Progress to regular foods as tolerated. Avoid greasy, spicy, heavy foods. If nausea and/or vomiting occur, drink only clear liquids until the nausea and/or vomiting subsides. Call your physician if vomiting continues.  Special Instructions/Symptoms: Your throat may feel dry or sore from the anesthesia or the breathing tube placed in your throat during surgery. If this causes discomfort, gargle with warm salt water. The discomfort should disappear within 24 hours.  If you had a scopolamine patch placed behind your ear for the management of post- operative nausea and/or vomiting:  1. The medication in the patch is effective for 72 hours, after which it should be removed.  Wrap patch in a tissue and discard in the trash. Wash hands thoroughly with soap and water. 2. You may remove the patch earlier than 72 hours if you experience unpleasant side effects which may include dry mouth, dizziness or visual disturbances. 3. Avoid touching the patch. Wash your hands with soap and water after contact with the patch.

## 2018-10-23 ENCOUNTER — Encounter (HOSPITAL_BASED_OUTPATIENT_CLINIC_OR_DEPARTMENT_OTHER): Payer: Self-pay | Admitting: Urology

## 2019-01-22 ENCOUNTER — Other Ambulatory Visit: Payer: Self-pay | Admitting: Urology

## 2019-02-04 ENCOUNTER — Encounter (HOSPITAL_BASED_OUTPATIENT_CLINIC_OR_DEPARTMENT_OTHER): Payer: Self-pay | Admitting: *Deleted

## 2019-02-04 ENCOUNTER — Other Ambulatory Visit: Payer: Self-pay

## 2019-02-04 NOTE — Progress Notes (Signed)
Spoke w/ via phone for pre-op interview---Phyllis House needs dos----   Urine poct           House results------none COVID test ------02-07-2019 Arrive at -------1030 NPO after ------midnight Medications to take morning of surgery -----hydrocodone prn, onadestron prn Diabetic medication -----n/a Patient Special Instructions ----- Pre-Op special Istructions ----- Patient verbalized understanding of instructions that were given at this phone interview. Patient denies shortness of breath, chest pain, fever, cough a this phone interview.

## 2019-02-07 ENCOUNTER — Other Ambulatory Visit (HOSPITAL_COMMUNITY)
Admission: RE | Admit: 2019-02-07 | Discharge: 2019-02-07 | Disposition: A | Discharge status: Home / Self Care | Payer: BC Managed Care – PPO | Source: Ambulatory Visit | Attending: Urology | Admitting: Urology

## 2019-02-07 DIAGNOSIS — Z01812 Encounter for preprocedural laboratory examination: Secondary | ICD-10-CM | POA: Insufficient documentation

## 2019-02-07 DIAGNOSIS — N201 Calculus of ureter: Secondary | ICD-10-CM | POA: Diagnosis not present

## 2019-02-07 DIAGNOSIS — Z20828 Contact with and (suspected) exposure to other viral communicable diseases: Secondary | ICD-10-CM | POA: Insufficient documentation

## 2019-02-10 LAB — NOVEL CORONAVIRUS, NAA (HOSP ORDER, SEND-OUT TO REF LAB; TAT 18-24 HRS): SARS-CoV-2, NAA: NOT DETECTED

## 2019-02-10 NOTE — H&P (Signed)
Office Visit Report     02/03/2019   --------------------------------------------------------------------------------   Phyllis House  MRN: [id number]  DOB: October 25, 1975, 43 year old Female  SSN: [id number]   PRIMARY CARE:  MEDICAID [place] [facility] [place]  REFERRING:    PROVIDER:  Festus Aloe, M.D.  TREATING:  Azucena Fallen, NP  LOCATION:  [facility] [phone]     --------------------------------------------------------------------------------   CC: Eval of flank pain  HPI: Phyllis House is a 43 year-old female established patient who is here for further eval and management of flank pain.  12/24/18: The patient developed right inguinal pain with associated urgency and frequency several days ago. The pain has since begun to radiate into the right flank. This is classic pain for her. She denies any fevers or chills. She denies any dysuria.   01/17/19: Patient with above-noted history. She is noted to have approximately 2 x 4 mm right ureteral calculus on KUB imaging at prior office visit. She was started on medical expulsive therapy. She returns today for follow-up. Today she states that she has not seen a stone pass. She continues to have intermittent right flank pain. This is worse at night when she is laying down. She has had some intermittent nausea, as well. She continues to have some intermittent irritative voiding symptoms. She denies gross hematuria. She is currently on her menstrual cycle. No complaints of dysuria, fever, or chills.   02/03/19: Patient with above noted hx. She returns today for ongoing follow up. She states that she did see a stone pass in the interim last week. However, she continues to have intermittent episodes of RLQ pain, which can range from mild to severe. She denies gross hematuria, dysuria, or exacerbation of voiding symptoms currently. She denies fevers, chills, nausea, or vomiting.   The problem is on the right side. Her pain  started about approximately 12/17/2018. The pain is dull. The pain is constant. The intensity of her pain is rated as a 3. The pain does radiate.   Pain medications makes the pain better. Nothing causes the pain to become worse. She was treated with the following pain medication(s): Tylenol and Ibuprofen.   She has had this same pain previously. She has had kidney stones. She did not see the blood in her urine. The patient has developed frequency and urgency. She has not had fever and chills.     ALLERGIES: No Allergies    MEDICATIONS: Ultram 50 mg tablet 1-2 tablet PO Q 6 H  Advil  Aleve  Hydrocodone-Acetaminophen 5 mg-325 mg tablet 1 tablet PO Q 6 H PRN  Litholite  Ondansetron Hcl 4 mg tablet 1 tablet PO Q 8 H PRN     GU PSH: Cysto Remove Stent FB Sim - 09/25/2018 Cysto Uretero Lithotripsy, Right - 10/22/2018, 2014 Cystoscopy And Treatment, Right - 02/22/2018, 2008 Cystoscopy Insert Stent, Right - 02/22/2018, Left - 02/08/2018, Right - 2018, 2014, 2014 Ureteroscopic laser litho, Right - 10/22/2018, Bilateral - 09/19/2018, Left - 02/22/2018, Right - 02/08/2018, Right - 2018 Ureteroscopic stone removal, Right - 2018       PSH Notes: Cystoscopy With Ureteroscopy With Lithotripsy, Cystoscopy With Insertion Of Ureteral Stent Left, Cystoscopy With Insertion Of Ureteral Stent Left, Cystoscopy With Manipulation Of Ureteral Calculus   NON-GU PSH: None   GU PMH: Flank Pain - 01/17/2019, - 12/24/2018, - 09/11/2018 Renal calculus - 12/03/2018, - 09/30/2018, - 09/27/2018, - 09/25/2018, - 09/11/2018, - 2018, Nephrolithiasis, - 2015 Acute Cystitis/UTI - 09/27/2018 Gross  hematuria - 09/11/2018 LLQ pain - 09/11/2018 RLQ pain - 09/11/2018 Mixed incontinence, Mixed stress and urge urinary incontinence - 2015 Urinary Tract Inf, Unspec site, Urinary tract infection - 2015, Pyuria, - 2015 Hydronephrosis Unspec, Hydronephrosis On The Right - 2014 Ureteral calculus, Ureteral Stone - 2014    NON-GU PMH: Bacteriuria  - 2018 Encounter for general adult medical examination without abnormal findings, Encounter for preventive health examination - 2015 Hypokalemia, Hypokalemia - 2015 Hypercalciuria, Hypercalciuria - 2014 Personal history of other diseases of the nervous system and sense organs, History of migraine headaches - 2014 Personal history of other specified conditions, History of nausea - 2014    FAMILY HISTORY: Family Health Status Number - Runs In Family Father Deceased At Age80 ___ - Runs In Family nephrolithiasis - Uncle, Mother, Grandfather, Aunt   SOCIAL HISTORY: Marital Status: Single Preferred Language: English; Ethnicity: Not Hispanic Or Latino; Race: White Current Smoking Status: Patient has never smoked.   Tobacco Use Assessment Completed: Used Tobacco in last 30 days? Has never drank.  Drinks 3 caffeinated drinks per day. 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     Notes: Never A Smoker, Drug Use, Caffeine Use, Marital History - Single, Alcohol Use   REVIEW OF SYSTEMS:    GU Review Female:   Patient denies frequent urination, hard to postpone urination, burning /pain with urination, get up at night to urinate, leakage of urine, stream starts and stops, trouble starting your stream, have to strain to urinate, and being pregnant.  Gastrointestinal (Upper):   Patient denies nausea, vomiting, and indigestion/ heartburn.  Gastrointestinal (Lower):   Patient denies diarrhea and constipation.  Constitutional:   Patient denies fever, night sweats, weight loss, and fatigue.  Skin:   Patient denies skin rash/ lesion and itching.  Eyes:   Patient denies blurred vision and double vision.  Ears/ Nose/ Throat:   Patient denies sore throat and sinus problems.  Hematologic/Lymphatic:   Patient denies swollen glands and easy bruising.  Cardiovascular:   Patient denies leg swelling and chest pains.  Respiratory:   Patient denies shortness of breath and cough.  Endocrine:   Patient denies  excessive thirst.  Musculoskeletal:   Patient denies back pain and joint pain.  Neurological:   Patient denies headaches and dizziness.  Psychologic:   Patient denies depression and anxiety.   VITAL SIGNS:      02/03/2019 08:27 AM  Weight 165 lb / 74.84 kg  Height 62 in / 157.48 cm  BP 107/67 mmHg  Pulse 70 /min  Temperature 98.2 F / 36.7 C  BMI 30.2 kg/m   MULTI-SYSTEM PHYSICAL EXAMINATION:    Constitutional: Well-nourished. No physical deformities. Normally developed. Good grooming.   Respiratory: No labored breathing, no use of accessory muscles.   Cardiovascular: Normal temperature, normal extremity pulses, no swelling, no varicosities.  Neurologic / Psychiatric: Oriented to time, oriented to place, oriented to person. No depression, no anxiety, no agitation.  Gastrointestinal: No mass, no tenderness, no rigidity, non obese abdomen. Mild RLQ tenderness.   Musculoskeletal: Normal gait and station of head and neck.     PAST DATA REVIEWED:  Source Of History:  Patient  Records Review:   Previous Patient Records  Urine Test Review:   Urinalysis, Urine Culture  X-Ray Review: KUB: Reviewed Films.  C.T. Abdomen/Pelvis: Reviewed Films. Reviewed Report.     PROCEDURES:         KUB - [id number]  A single view of the abdomen is obtained. Multiple opacities noted within  the confines of bilateral renal shadows, which appear stable. No obvious opacities noted along the expected anatomical course of the left ureter. Along the course of the right distal ureter, there continues to be a questionable opacity in the vicinity of the right UVJ measuring approximately 4 mm. There is overlying bowel gas today which does make interpretation challenging. IUD noted.       Patient confirmed No Neulasta OnPro Device.           Urinalysis w/Scope - 81001 Dipstick Dipstick Cont'd Micro  Color: Yellow Bilirubin: Neg WBC/hpf: 0 - 5/hpf  Appearance: Clear Ketones: Neg RBC/hpf: 10 - 20/hpf  Specific  Gravity: 1.015 Blood: 2+ Bacteria: Rare (0-9/hpf)  pH: 6.5 Protein: Trace Cystals: NS (Not Seen)  Glucose: Neg Urobilinogen: 0.2 Casts: NS (Not Seen)    Nitrites: Neg Trichomonas: Not Present    Leukocyte Esterase: Trace Mucous: Not Present      Epithelial Cells: 0 - 5/hpf      Yeast: NS (Not Seen)      Sperm: Not Present    Notes:      ASSESSMENT:      ICD-10 Details  1 GU:   Flank Pain - R10.84   2   Renal calculus - N20.0   3   RLQ pain - R10.31    PLAN:           Orders Labs Urine Culture          Document Letter(s):  Created for Patient: Clinical Summary         Notes:   Urinalysis sent for culture today. On KUB imaging today, along the course of the right distal ureter, there continues to be a questionable opacity in the vicinity of the right UVJ measuring approximately 4 mm. I will review today's imaging study with her urologist, but will not alter plan for scheduled right URS on 10/13 at this time. I will keep her updated regarding differing recommendations. She will continue MET in the interim. Return precuations reviewed for fevers or progressive pain, nausea, or vomiting.         Next Appointment:      Next Appointment: 02/11/2019 12:45 PM    Appointment Type: Surgery     Location: [facility] [phone]    Provider: Festus Aloe, M.D.    Reason for Visit: NE/OP CYSTO, RT RPG, RT URS HLL, POSS RT UR STENT PLACEMENT      * Signed by Azucena Fallen, NP on 02/03/19 at 11:05 AM (EDT)*     The information contained in this medical record document is considered private and confidential patient information. This information can only be used for the medical diagnosis and/or medical services that are being provided by the patient's selected caregivers. This information can only be distributed outside of the patient's care if the patient agrees and signs waivers of authorization for this information to be sent to an outside source or route.     Addendum:  I spoke a nurse practitioner Ronal Fear and reviewed the patient's chart and imaging.  Urine culture with less than 15,000 CFU mixed growth.

## 2019-02-11 ENCOUNTER — Encounter (HOSPITAL_BASED_OUTPATIENT_CLINIC_OR_DEPARTMENT_OTHER): Payer: Self-pay | Admitting: *Deleted

## 2019-02-11 ENCOUNTER — Encounter (HOSPITAL_BASED_OUTPATIENT_CLINIC_OR_DEPARTMENT_OTHER)
Admission: RE | Disposition: A | Discharge status: Home / Self Care | Payer: Self-pay | Source: Home / Self Care | Attending: Urology

## 2019-02-11 ENCOUNTER — Ambulatory Visit (HOSPITAL_BASED_OUTPATIENT_CLINIC_OR_DEPARTMENT_OTHER): Payer: BC Managed Care – PPO | Admitting: Certified Registered Nurse Anesthetist

## 2019-02-11 ENCOUNTER — Ambulatory Visit (HOSPITAL_BASED_OUTPATIENT_CLINIC_OR_DEPARTMENT_OTHER)
Admission: RE | Admit: 2019-02-11 | Discharge: 2019-02-11 | Disposition: A | Discharge status: Home / Self Care | Payer: BC Managed Care – PPO | Attending: Urology | Admitting: Urology

## 2019-02-11 DIAGNOSIS — N202 Calculus of kidney with calculus of ureter: Secondary | ICD-10-CM | POA: Insufficient documentation

## 2019-02-11 DIAGNOSIS — Z793 Long term (current) use of hormonal contraceptives: Secondary | ICD-10-CM | POA: Insufficient documentation

## 2019-02-11 DIAGNOSIS — N201 Calculus of ureter: Secondary | ICD-10-CM

## 2019-02-11 DIAGNOSIS — Z87442 Personal history of urinary calculi: Secondary | ICD-10-CM | POA: Insufficient documentation

## 2019-02-11 HISTORY — PX: CYSTOSCOPY/URETEROSCOPY/HOLMIUM LASER/STENT PLACEMENT: SHX6546

## 2019-02-11 HISTORY — PX: HOLMIUM LASER APPLICATION: SHX5852

## 2019-02-11 LAB — POCT PREGNANCY, URINE: Preg Test, Ur: NEGATIVE

## 2019-02-11 SURGERY — CYSTOSCOPY/URETEROSCOPY/HOLMIUM LASER/STENT PLACEMENT
Anesthesia: General | Site: Renal | Laterality: Right

## 2019-02-11 MED ORDER — EPHEDRINE SULFATE 50 MG/ML IJ SOLN
INTRAMUSCULAR | Status: DC | PRN
  Administered 2019-02-11: 5 mg via INTRAVENOUS

## 2019-02-11 MED ORDER — MIDAZOLAM HCL 5 MG/5ML IJ SOLN
INTRAMUSCULAR | Status: DC | PRN
  Administered 2019-02-11: 2 mg via INTRAVENOUS

## 2019-02-11 MED ORDER — FENTANYL CITRATE (PF) 100 MCG/2ML IJ SOLN
25.0000 ug | INTRAMUSCULAR | Status: DC | PRN
  Filled 2019-02-11: qty 1

## 2019-02-11 MED ORDER — FENTANYL CITRATE (PF) 100 MCG/2ML IJ SOLN
INTRAMUSCULAR | Status: AC
  Filled 2019-02-11: qty 2

## 2019-02-11 MED ORDER — SODIUM CHLORIDE 0.9 % IR SOLN
Status: DC | PRN
  Administered 2019-02-11: 3000 mL

## 2019-02-11 MED ORDER — DEXAMETHASONE SODIUM PHOSPHATE 10 MG/ML IJ SOLN
INTRAMUSCULAR | Status: AC
  Filled 2019-02-11: qty 1

## 2019-02-11 MED ORDER — CEFAZOLIN SODIUM-DEXTROSE 2-4 GM/100ML-% IV SOLN
INTRAVENOUS | Status: AC
  Filled 2019-02-11: qty 100

## 2019-02-11 MED ORDER — LIDOCAINE 2% (20 MG/ML) 5 ML SYRINGE
INTRAMUSCULAR | Status: AC
  Filled 2019-02-11: qty 5

## 2019-02-11 MED ORDER — SCOPOLAMINE 1 MG/3DAYS TD PT72
1.0000 | MEDICATED_PATCH | TRANSDERMAL | Status: DC
  Administered 2019-02-11: 1.5 mg via TRANSDERMAL
  Filled 2019-02-11: qty 1

## 2019-02-11 MED ORDER — MIDAZOLAM HCL 2 MG/2ML IJ SOLN
INTRAMUSCULAR | Status: AC
  Filled 2019-02-11: qty 2

## 2019-02-11 MED ORDER — OXYCODONE HCL 5 MG PO TABS
5.0000 mg | ORAL_TABLET | Freq: Once | ORAL | Status: DC | PRN
  Filled 2019-02-11: qty 1

## 2019-02-11 MED ORDER — ONDANSETRON HCL 4 MG/2ML IJ SOLN
INTRAMUSCULAR | Status: AC
  Filled 2019-02-11: qty 2

## 2019-02-11 MED ORDER — IOHEXOL 300 MG/ML  SOLN
INTRAMUSCULAR | Status: DC | PRN
  Administered 2019-02-11: 4 mL

## 2019-02-11 MED ORDER — LACTATED RINGERS IV SOLN
INTRAVENOUS | Status: DC
  Administered 2019-02-11: 09:00:00 via INTRAVENOUS
  Filled 2019-02-11: qty 1000

## 2019-02-11 MED ORDER — KETOROLAC TROMETHAMINE 30 MG/ML IJ SOLN
INTRAMUSCULAR | Status: DC | PRN
  Administered 2019-02-11: 30 mg via INTRAVENOUS

## 2019-02-11 MED ORDER — CEFAZOLIN SODIUM-DEXTROSE 2-4 GM/100ML-% IV SOLN
2.0000 g | Freq: Once | INTRAVENOUS | Status: AC
  Administered 2019-02-11: 2 g via INTRAVENOUS
  Filled 2019-02-11: qty 100

## 2019-02-11 MED ORDER — PROPOFOL 10 MG/ML IV BOLUS
INTRAVENOUS | Status: AC
  Filled 2019-02-11: qty 20

## 2019-02-11 MED ORDER — FENTANYL CITRATE (PF) 100 MCG/2ML IJ SOLN
INTRAMUSCULAR | Status: DC | PRN
  Administered 2019-02-11: 50 ug via INTRAVENOUS

## 2019-02-11 MED ORDER — LIDOCAINE 2% (20 MG/ML) 5 ML SYRINGE
INTRAMUSCULAR | Status: DC | PRN
  Administered 2019-02-11: 100 mg via INTRAVENOUS

## 2019-02-11 MED ORDER — DEXAMETHASONE SODIUM PHOSPHATE 4 MG/ML IJ SOLN
INTRAMUSCULAR | Status: DC | PRN
  Administered 2019-02-11: 10 mg via INTRAVENOUS

## 2019-02-11 MED ORDER — ONDANSETRON HCL 4 MG/2ML IJ SOLN
4.0000 mg | Freq: Once | INTRAMUSCULAR | Status: DC | PRN
  Filled 2019-02-11: qty 2

## 2019-02-11 MED ORDER — OXYCODONE HCL 5 MG/5ML PO SOLN
5.0000 mg | Freq: Once | ORAL | Status: DC | PRN
  Filled 2019-02-11: qty 5

## 2019-02-11 MED ORDER — PROPOFOL 10 MG/ML IV BOLUS
INTRAVENOUS | Status: DC | PRN
  Administered 2019-02-11: 200 mg via INTRAVENOUS
  Administered 2019-02-11: 40 mg via INTRAVENOUS

## 2019-02-11 MED ORDER — ONDANSETRON HCL 4 MG/2ML IJ SOLN
INTRAMUSCULAR | Status: DC | PRN
  Administered 2019-02-11: 4 mg via INTRAVENOUS

## 2019-02-11 MED ORDER — SCOPOLAMINE 1 MG/3DAYS TD PT72
MEDICATED_PATCH | TRANSDERMAL | Status: AC
  Filled 2019-02-11: qty 1

## 2019-02-11 SURGICAL SUPPLY — 26 items
BAG DRAIN URO-CYSTO SKYTR STRL (DRAIN) ×3 IMPLANT
BAG DRN UROCATH (DRAIN) ×1
BASKET ZERO TIP NITINOL 2.4FR (BASKET) ×2 IMPLANT
BSKT STON RTRVL ZERO TP 2.4FR (BASKET) ×1
CATH URET 5FR 28IN CONE TIP (BALLOONS)
CATH URET 5FR 28IN OPEN ENDED (CATHETERS) IMPLANT
CATH URET 5FR 70CM CONE TIP (BALLOONS) IMPLANT
CATH URET DUAL LUMEN 6-10FR 50 (CATHETERS) IMPLANT
CLOTH BEACON ORANGE TIMEOUT ST (SAFETY) ×3 IMPLANT
GLOVE BIO SURGEON STRL SZ7.5 (GLOVE) ×3 IMPLANT
GLOVE BIO SURGEON STRL SZ8 (GLOVE) IMPLANT
GLOVE BIOGEL PI IND STRL 6.5 (GLOVE) IMPLANT
GLOVE BIOGEL PI INDICATOR 6.5 (GLOVE) ×4
GOWN STRL REUS W/TWL LRG LVL3 (GOWN DISPOSABLE) ×5 IMPLANT
GUIDEWIRE ANG ZIPWIRE 038X150 (WIRE) ×2 IMPLANT
GUIDEWIRE STR DUAL SENSOR (WIRE) ×3 IMPLANT
IV NS IRRIG 3000ML ARTHROMATIC (IV SOLUTION) ×4 IMPLANT
KIT TURNOVER CYSTO (KITS) ×3 IMPLANT
MANIFOLD NEPTUNE II (INSTRUMENTS) ×3 IMPLANT
NS IRRIG 500ML POUR BTL (IV SOLUTION) ×3 IMPLANT
PACK CYSTO (CUSTOM PROCEDURE TRAY) ×3 IMPLANT
SHEATH URET ACCESS 12FR/35CM (UROLOGICAL SUPPLIES) ×2 IMPLANT
STENT URET 6FRX24 CONTOUR (STENTS) ×2 IMPLANT
TUBE CONNECTING 12'X1/4 (SUCTIONS) ×1
TUBE CONNECTING 12X1/4 (SUCTIONS) ×2 IMPLANT
TUBING UROLOGY SET (TUBING) ×3 IMPLANT

## 2019-02-11 NOTE — Discharge Instructions (Signed)
CYSTOSCOPY HOME CARE INSTRUCTIONS  Stent: Remove the stent on Friday morning, February 14, 2019 by pulling the string  Activity: Rest for the remainder of the day.  Do not drive or operate equipment today.  You may resume normal activities in one to two days as instructed by your physician.   Meals: Drink plenty of liquids and eat light foods such as gelatin or soup this evening.  You may return to a normal meal plan tomorrow.  Return to Work: You may return to work in one to two days or as instructed by your physician.  Special Instructions / Symptoms: Call your physician if any of these symptoms occur:   -persistent or heavy bleeding  -bleeding which continues after first few urination  -large blood clots that are difficult to pass  -urine stream diminishes or stops completely  -fever equal to or higher than 101 degrees Farenheit.  -cloudy urine with a strong, foul odor  -severe pain  Females should always wipe from front to back after elimination.  You may feel some burning pain when you urinate.  This should disappear with time.  Applying moist heat to the lower abdomen or a hot tub bath may help relieve the pain. \  Follow-Up / Date of Return Visit to Your Physician:  As instructed Call for an appointment to arrange follow-up.  Patient Signature:  ________________________________________________________  Nurse's Signature:  ________________________________________________________  Post Anesthesia Home Care Instructions  Activity: Get plenty of rest for the remainder of the day. A responsible individual must stay with you for 24 hours following the procedure.  For the next 24 hours, DO NOT: -Drive a car -Paediatric nurse -Drink alcoholic beverages -Take any medication unless instructed by your physician -Make any legal decisions or sign important papers.  Meals: Start with liquid foods such as gelatin or soup. Progress to regular foods as tolerated. Avoid greasy,  spicy, heavy foods. If nausea and/or vomiting occur, drink only clear liquids until the nausea and/or vomiting subsides. Call your physician if vomiting continues.  Special Instructions/Symptoms: Your throat may feel dry or sore from the anesthesia or the breathing tube placed in your throat during surgery. If this causes discomfort, gargle with warm salt water. The discomfort should disappear within 24 hours.  If you had a scopolamine patch placed behind your ear for the management of post- operative nausea and/or vomiting:  1. The medication in the patch is effective for 72 hours, after which it should be removed.  Wrap patch in a tissue and discard in the trash. Wash hands thoroughly with soap and water. 2. You may remove the patch earlier than 72 hours if you experience unpleasant side effects which may include dry mouth, dizziness or visual disturbances. 3. Avoid touching the patch. Wash your hands with soap and water after contact with the patch.

## 2019-02-11 NOTE — Anesthesia Preprocedure Evaluation (Addendum)
Anesthesia Evaluation  Patient identified by MRN, date of birth, ID band Patient awake    Reviewed: Allergy & Precautions, NPO status , Patient's Chart, lab work & pertinent test results  History of Anesthesia Complications (+) PONVNegative for: history of anesthetic complications  Airway Mallampati: I  TM Distance: >3 FB Neck ROM: Full    Dental no notable dental hx.    Pulmonary neg pulmonary ROS,    Pulmonary exam normal        Cardiovascular negative cardio ROS Normal cardiovascular exam     Neuro/Psych negative neurological ROS  negative psych ROS   GI/Hepatic negative GI ROS, Neg liver ROS,   Endo/Other  negative endocrine ROS  Renal/GU Renal disease (ureteral stone)  negative genitourinary   Musculoskeletal negative musculoskeletal ROS (+)   Abdominal   Peds  Hematology negative hematology ROS (+)   Anesthesia Other Findings   Reproductive/Obstetrics                           Anesthesia Physical Anesthesia Plan  ASA: II  Anesthesia Plan: General   Post-op Pain Management:    Induction: Intravenous  PONV Risk Score and Plan: 4 or greater and Ondansetron, Dexamethasone, Treatment may vary due to age or medical condition, Midazolam and Scopolamine patch - Pre-op  Airway Management Planned: LMA  Additional Equipment: None  Intra-op Plan:   Post-operative Plan: Extubation in OR  Informed Consent: I have reviewed the patients History and Physical, chart, labs and discussed the procedure including the risks, benefits and alternatives for the proposed anesthesia with the patient or authorized representative who has indicated his/her understanding and acceptance.     Dental advisory given  Plan Discussed with:   Anesthesia Plan Comments:       Anesthesia Quick Evaluation

## 2019-02-11 NOTE — Interval H&P Note (Signed)
History and Physical Interval Note:  02/11/2019 9:46 AM  Phyllis House  has presented today for surgery, with the diagnosis of RIGHT URETERAL STONE.  The various methods of treatment have been discussed with the patient and family. After consideration of risks, benefits and other options for treatment, the patient has consented to  Procedure(s) with comments: CYSTOSCOPY/RETROGRADE/URETEROSCOPY/HOLMIUM LASER/STENT PLACEMENT (Right) - ONLY NEEDS 60 MIN as a surgical intervention.  The patient's history has been reviewed, patient examined, no change in status, stable for surgery.  I have reviewed the patient's chart and labs.  She has had no fevers or dysuria but continues to have some colicky right lower quadrant pain and there was concern for right distal stone on recent KUB.  Questions were answered to the patient's satisfaction.  She elects to proceed.   Festus Aloe

## 2019-02-11 NOTE — Op Note (Addendum)
Preoperative diagnosis: Right ureteral stone Postoperative diagnosis: Right ureteral stone, right renal stones  Procedure: Cystoscopy with right retrograde pyelogram, right ureteroscopy, holmium laser lithotripsy, stone basket extraction right ureteral stent placement  Surgeon: Junious Silk  Anesthesia: General  Indication for procedure: Phyllis House is a 43 year old female with chronic stone formation.  She makes stones quickly and has been refractory to most medical therapies.  She passed a stone recently but continued to have colicky right lower quadrant pain and opacities on KUB in the right distal ureter.  Findings: On cystoscopy the urethra and the bladder are unremarkable without stone or foreign body or other lesion.  Right retrograde pyelogram-this outlined to filling defects in the distal ureter at the ureterovesical junction consistent with stones.  Mild dilation proximally.  On ureteroscopy 2 stones were noted in the right distal ureter and 3 stones in the kidney with new stones forming in the lower mid and upper pole calyces.  All stones were dusted.  Description of procedure: After consent was obtained patient was brought to the operating room.  After adequate anesthesia she was placed in lithotomy position and prepped and draped in the usual sterile fashion.  A timeout was performed to confirm the patient and procedure.  The cystoscope was passed per urethra and the bladder inspected.  The right ureteral orifice was cannulated with a 5 [place] open-ended catheter and right retrograde injection of contrast was performed.  A sensor wire was then advanced into the collecting system.  A dual channel semirigid ureteroscope was advanced where the stone was noted in the distal ureter at the UVJ and a 200 m laser fiber advanced.  The stone was fragmented at 0.3 and 40 and 1 and 10.  There was another stone proximal to this and it was fragmented.  I used a 0 tip basket to sequentially drop all  these fragments into the bladder.  There was no ureteral stricture although there was some inflammation around the distal ureter at the UVJ.  The semirigid was advanced into the proximal ureter no other stones were noted and a Glidewire was advanced under direct vision.  The scope was backed out.  I passed a ureteral access sheath without difficulty over the sensor wire.  A dual channel digital ureteroscope was advanced into the collecting system or any new kidney stones were dusted at the same settings.  There were no fragments large enough for basketing after careful inspection of the collecting system.  Renal pelvis and UPJ again normal.  Access sheath backloaded on the cystoscope and the renal pelvis and ureter inspected on the way out noted to have no other stone fragment or injury.  Wire was backloaded on the cystoscope and a 6 x 24 cm stent advanced.  The wire was removed with a good coil seen in the kidney and a good coil in the bladder.  We left a string on the stent and tucked it into the vagina.  She was awakened taken to recovery room in stable condition.  Complications: None  Blood loss: Minimal  Specimens: None  Drains: 6 x 24 cm right ureteral stent with string  Disposition: Patient stable to PACU -I spoke with Anderson Malta in phase 2 and she is doing well.  We went over the procedure.  She is taking litholyte but it tastes like "baking soda".

## 2019-02-11 NOTE — Transfer of Care (Signed)
Immediate Anesthesia Transfer of Care Note  Patient: Phyllis House  Procedure(s) Performed: CYSTOSCOPY/RETROGRADE/URETEROSCOPY/HOLMIUM LASER/STENT PLACEMENT (Right Renal) HOLMIUM LASER APPLICATION (Right Renal)  Patient Location: PACU  Anesthesia Type:General  Level of Consciousness: awake, alert  and oriented  Airway & Oxygen Therapy: Patient Spontanous Breathing and Patient connected to nasal cannula oxygen  Post-op Assessment: Report given to RN and Post -op Vital signs reviewed and stable  Post vital signs: Reviewed and stable  Last Vitals:  Vitals Value Taken Time  BP 122/71 02/11/19 1115  Temp 36.9 C 02/11/19 1112  Pulse 91 02/11/19 1118  Resp 21 02/11/19 1118  SpO2 100 % 02/11/19 1118  Vitals shown include unvalidated device data.  Last Pain:  Vitals:   02/11/19 1112  TempSrc:   PainSc: Asleep      Patients Stated Pain Goal: 2 (10/24/92 8546)  Complications: No apparent anesthesia complications

## 2019-02-11 NOTE — Addendum Note (Signed)
Addendum  created 02/11/19 1239 by Suan Halter, CRNA   Charge Capture section accepted

## 2019-02-11 NOTE — Anesthesia Postprocedure Evaluation (Signed)
Anesthesia Post Note  Patient: Phyllis House  Procedure(s) Performed: CYSTOSCOPY/RETROGRADE/URETEROSCOPY/HOLMIUM LASER/STENT PLACEMENT (Right Renal) HOLMIUM LASER APPLICATION (Right Renal)     Patient location during evaluation: PACU Anesthesia Type: General Level of consciousness: awake and alert Pain management: pain level controlled Vital Signs Assessment: post-procedure vital signs reviewed and stable Respiratory status: spontaneous breathing, nonlabored ventilation and respiratory function stable Cardiovascular status: blood pressure returned to baseline and stable Postop Assessment: no apparent nausea or vomiting Anesthetic complications: no    Last Vitals:  Vitals:   02/11/19 1145 02/11/19 1215  BP: (!) 105/52 (!) 106/54  Pulse: 66 62  Resp: 17 10  Temp:    SpO2: 98% 97%    Last Pain:  Vitals:   02/11/19 1215  TempSrc:   PainSc: 0-No pain                 Lidia Collum

## 2019-02-11 NOTE — Anesthesia Procedure Notes (Signed)
Procedure Name: LMA Insertion Date/Time: 02/11/2019 10:10 AM Performed by: Hewitt Blade, CRNA Pre-anesthesia Checklist: Patient identified, Emergency Drugs available, Suction available and Patient being monitored Patient Re-evaluated:Patient Re-evaluated prior to induction Oxygen Delivery Method: Circle system utilized Preoxygenation: Pre-oxygenation with 100% oxygen Induction Type: IV induction Ventilation: Mask ventilation without difficulty LMA: LMA inserted LMA Size: 4.0 Number of attempts: 1 Placement Confirmation: positive ETCO2 and breath sounds checked- equal and bilateral Tube secured with: Tape Dental Injury: Teeth and Oropharynx as per pre-operative assessment

## 2019-02-12 ENCOUNTER — Encounter (HOSPITAL_BASED_OUTPATIENT_CLINIC_OR_DEPARTMENT_OTHER): Payer: Self-pay | Admitting: Urology

## 2019-05-07 ENCOUNTER — Ambulatory Visit: Payer: BC Managed Care – PPO | Attending: Internal Medicine

## 2019-05-07 DIAGNOSIS — Z20822 Contact with and (suspected) exposure to covid-19: Secondary | ICD-10-CM

## 2019-05-08 ENCOUNTER — Other Ambulatory Visit: Payer: Self-pay | Admitting: Urology

## 2019-05-08 ENCOUNTER — Observation Stay (HOSPITAL_COMMUNITY)
Admission: AD | Admit: 2019-05-08 | Discharge: 2019-05-09 | Disposition: A | Discharge status: Home / Self Care | Payer: BC Managed Care – PPO | Source: Ambulatory Visit | Attending: Urology | Admitting: Urology

## 2019-05-08 ENCOUNTER — Inpatient Hospital Stay (HOSPITAL_COMMUNITY): Payer: BC Managed Care – PPO | Admitting: Anesthesiology

## 2019-05-08 ENCOUNTER — Other Ambulatory Visit: Payer: Self-pay

## 2019-05-08 ENCOUNTER — Inpatient Hospital Stay (HOSPITAL_COMMUNITY): Payer: BC Managed Care – PPO

## 2019-05-08 ENCOUNTER — Encounter (HOSPITAL_COMMUNITY): Payer: Self-pay | Admitting: Urology

## 2019-05-08 ENCOUNTER — Encounter (HOSPITAL_COMMUNITY)
Admission: AD | Disposition: A | Discharge status: Home / Self Care | Payer: Self-pay | Source: Ambulatory Visit | Attending: Urology

## 2019-05-08 DIAGNOSIS — Z23 Encounter for immunization: Secondary | ICD-10-CM | POA: Insufficient documentation

## 2019-05-08 DIAGNOSIS — N132 Hydronephrosis with renal and ureteral calculous obstruction: Secondary | ICD-10-CM | POA: Diagnosis present

## 2019-05-08 DIAGNOSIS — Z20822 Contact with and (suspected) exposure to covid-19: Secondary | ICD-10-CM | POA: Insufficient documentation

## 2019-05-08 HISTORY — PX: CYSTOSCOPY W/ URETERAL STENT PLACEMENT: SHX1429

## 2019-05-08 LAB — CBC
HCT: 35.8 % — ABNORMAL LOW (ref 36.0–46.0)
HCT: 32.7 % — ABNORMAL LOW (ref 36.0–46.0)
Hemoglobin: 11.1 g/dL — ABNORMAL LOW (ref 12.0–15.0)
Hemoglobin: 10.2 g/dL — ABNORMAL LOW (ref 12.0–15.0)
MCH: 23.8 pg — ABNORMAL LOW (ref 26.0–34.0)
MCH: 24.3 pg — ABNORMAL LOW (ref 26.0–34.0)
MCHC: 31 g/dL (ref 30.0–36.0)
MCHC: 31.2 g/dL (ref 30.0–36.0)
MCV: 76.8 fL — ABNORMAL LOW (ref 80.0–100.0)
MCV: 78 fL — ABNORMAL LOW (ref 80.0–100.0)
Platelets: 151 10*3/uL (ref 150–400)
Platelets: 127 10*3/uL — ABNORMAL LOW (ref 150–400)
RBC: 4.66 MIL/uL (ref 3.87–5.11)
RBC: 4.19 MIL/uL (ref 3.87–5.11)
RDW: 16 % — ABNORMAL HIGH (ref 11.5–15.5)
RDW: 16.1 % — ABNORMAL HIGH (ref 11.5–15.5)
WBC: 6.3 10*3/uL (ref 4.0–10.5)
WBC: 5 10*3/uL (ref 4.0–10.5)
nRBC: 0 % (ref 0.0–0.2)
nRBC: 0 % (ref 0.0–0.2)

## 2019-05-08 LAB — BASIC METABOLIC PANEL
Anion gap: 9 (ref 5–15)
BUN: 11 mg/dL (ref 6–20)
CO2: 20 mmol/L — ABNORMAL LOW (ref 22–32)
Calcium: 8.3 mg/dL — ABNORMAL LOW (ref 8.9–10.3)
Chloride: 108 mmol/L (ref 98–111)
Creatinine, Ser: 0.93 mg/dL (ref 0.44–1.00)
GFR calc Af Amer: >60 mL/min (ref >60)
GFR calc non Af Amer: >60 mL/min (ref >60)
Glucose, Bld: 98 mg/dL (ref 70–99)
Potassium: 3.7 mmol/L (ref 3.5–5.1)
Sodium: 137 mmol/L (ref 135–145)

## 2019-05-08 LAB — RESPIRATORY PANEL BY RT PCR (FLU A&B, COVID)
Influenza A by PCR: NEGATIVE
Influenza B by PCR: NEGATIVE
SARS Coronavirus 2 by RT PCR: NEGATIVE

## 2019-05-08 LAB — PREGNANCY, URINE: Preg Test, Ur: NEGATIVE

## 2019-05-08 LAB — NOVEL CORONAVIRUS, NAA: SARS-CoV-2, NAA: NOT DETECTED

## 2019-05-08 SURGERY — CYSTOSCOPY, WITH RETROGRADE PYELOGRAM AND URETERAL STENT INSERTION
Anesthesia: General | Laterality: Right

## 2019-05-08 MED ORDER — SODIUM CHLORIDE 0.9 % IV SOLN
2.0000 g | INTRAVENOUS | Status: AC
  Administered 2019-05-08: 14:00:00 2 g via INTRAVENOUS

## 2019-05-08 MED ORDER — IOHEXOL 300 MG/ML  SOLN
INTRAMUSCULAR | Status: DC | PRN
  Administered 2019-05-08: 10 mL

## 2019-05-08 MED ORDER — INFLUENZA VAC SPLIT QUAD 0.5 ML IM SUSY
0.5000 mL | PREFILLED_SYRINGE | INTRAMUSCULAR | Status: AC
  Administered 2019-05-09: 12:00:00 0.5 mL via INTRAMUSCULAR
  Filled 2019-05-08: qty 0.5

## 2019-05-08 MED ORDER — MIDAZOLAM HCL 2 MG/2ML IJ SOLN
INTRAMUSCULAR | Status: AC
  Filled 2019-05-08: qty 2

## 2019-05-08 MED ORDER — HYDROMORPHONE HCL 1 MG/ML IJ SOLN: 0.5000 mg | INTRAMUSCULAR | Status: DC | PRN

## 2019-05-08 MED ORDER — CHLORHEXIDINE GLUCONATE CLOTH 2 % EX PADS: 6.0000 | MEDICATED_PAD | Freq: Every day | CUTANEOUS | Status: DC

## 2019-05-08 MED ORDER — ONDANSETRON HCL 4 MG/2ML IJ SOLN
INTRAMUSCULAR | Status: DC | PRN
  Administered 2019-05-08: 4 mg via INTRAVENOUS

## 2019-05-08 MED ORDER — SODIUM CHLORIDE 0.9 % IV SOLN: INTRAVENOUS | Status: DC

## 2019-05-08 MED ORDER — MIDAZOLAM HCL 2 MG/2ML IJ SOLN: 0.5000 mg | Freq: Once | INTRAMUSCULAR | Status: DC | PRN

## 2019-05-08 MED ORDER — SODIUM CHLORIDE 0.9 % IR SOLN
Status: DC | PRN
  Administered 2019-05-08: 3000 mL

## 2019-05-08 MED ORDER — FENTANYL CITRATE (PF) 100 MCG/2ML IJ SOLN
INTRAMUSCULAR | Status: AC
  Filled 2019-05-08: qty 2

## 2019-05-08 MED ORDER — PROPOFOL 10 MG/ML IV BOLUS
INTRAVENOUS | Status: DC | PRN
  Administered 2019-05-08: 200 mg via INTRAVENOUS

## 2019-05-08 MED ORDER — ZOLPIDEM TARTRATE 5 MG PO TABS: 5.0000 mg | ORAL_TABLET | Freq: Every evening | ORAL | Status: DC | PRN

## 2019-05-08 MED ORDER — MIDAZOLAM HCL 5 MG/5ML IJ SOLN
INTRAMUSCULAR | Status: DC | PRN
  Administered 2019-05-08: 2 mg via INTRAVENOUS

## 2019-05-08 MED ORDER — SODIUM CHLORIDE 0.9 % IV SOLN
INTRAVENOUS | Status: AC
  Filled 2019-05-08: qty 20

## 2019-05-08 MED ORDER — ACETAMINOPHEN 325 MG PO TABS: 650.0000 mg | ORAL_TABLET | ORAL | Status: DC | PRN

## 2019-05-08 MED ORDER — LIDOCAINE HCL (CARDIAC) PF 100 MG/5ML IV SOSY
PREFILLED_SYRINGE | INTRAVENOUS | Status: DC | PRN
  Administered 2019-05-08: 80 mg via INTRAVENOUS

## 2019-05-08 MED ORDER — LACTATED RINGERS IV SOLN: INTRAVENOUS | Status: DC

## 2019-05-08 MED ORDER — OXYCODONE-ACETAMINOPHEN 5-325 MG PO TABS
1.0000 | ORAL_TABLET | ORAL | Status: DC | PRN
  Administered 2019-05-08 – 2019-05-09 (×3): 1 via ORAL
  Filled 2019-05-08 (×3): qty 1

## 2019-05-08 MED ORDER — SODIUM CHLORIDE 0.9 % IV SOLN
2.0000 g | INTRAVENOUS | Status: DC
  Administered 2019-05-09: 12:00:00 2 g via INTRAVENOUS
  Filled 2019-05-08: qty 2

## 2019-05-08 MED ORDER — SCOPOLAMINE 1 MG/3DAYS TD PT72
MEDICATED_PATCH | TRANSDERMAL | Status: AC
  Filled 2019-05-08: qty 1

## 2019-05-08 MED ORDER — FENTANYL CITRATE (PF) 100 MCG/2ML IJ SOLN
INTRAMUSCULAR | Status: DC | PRN
  Administered 2019-05-08: 100 ug via INTRAVENOUS

## 2019-05-08 MED ORDER — HYOSCYAMINE SULFATE 0.125 MG SL SUBL
0.1250 mg | SUBLINGUAL_TABLET | SUBLINGUAL | Status: DC | PRN
  Filled 2019-05-08: qty 1

## 2019-05-08 MED ORDER — MEPERIDINE HCL 50 MG/ML IJ SOLN: 6.2500 mg | INTRAMUSCULAR | Status: DC | PRN

## 2019-05-08 MED ORDER — DIPHENHYDRAMINE HCL 50 MG/ML IJ SOLN: 12.5000 mg | Freq: Four times a day (QID) | INTRAMUSCULAR | Status: DC | PRN

## 2019-05-08 MED ORDER — PROMETHAZINE HCL 25 MG/ML IJ SOLN: 6.2500 mg | INTRAMUSCULAR | Status: DC | PRN

## 2019-05-08 MED ORDER — DIPHENHYDRAMINE HCL 12.5 MG/5ML PO ELIX: 12.5000 mg | ORAL_SOLUTION | Freq: Four times a day (QID) | ORAL | Status: DC | PRN

## 2019-05-08 MED ORDER — FENTANYL CITRATE (PF) 100 MCG/2ML IJ SOLN: 25.0000 ug | INTRAMUSCULAR | Status: DC | PRN

## 2019-05-08 MED ORDER — DEXAMETHASONE SODIUM PHOSPHATE 4 MG/ML IJ SOLN
INTRAMUSCULAR | Status: DC | PRN
  Administered 2019-05-08: 10 mg via INTRAVENOUS

## 2019-05-08 MED ORDER — SCOPOLAMINE 1 MG/3DAYS TD PT72
1.0000 | MEDICATED_PATCH | TRANSDERMAL | Status: DC
  Administered 2019-05-08: 13:00:00 1.5 mg via TRANSDERMAL

## 2019-05-08 MED ORDER — ONDANSETRON HCL 4 MG/2ML IJ SOLN
4.0000 mg | INTRAMUSCULAR | Status: DC | PRN
  Administered 2019-05-08: 4 mg via INTRAVENOUS
  Filled 2019-05-08: qty 2

## 2019-05-08 SURGICAL SUPPLY — 12 items
BAG URO CATCHER STRL LF (MISCELLANEOUS) ×3 IMPLANT
CATH INTERMIT  6FR 70CM (CATHETERS) ×3 IMPLANT
CLOTH BEACON ORANGE TIMEOUT ST (SAFETY) ×3 IMPLANT
GLOVE BIOGEL M 8.0 STRL (GLOVE) ×3 IMPLANT
GOWN STRL REUS W/TWL XL LVL3 (GOWN DISPOSABLE) ×6 IMPLANT
GUIDEWIRE STR DUAL SENSOR (WIRE) ×3 IMPLANT
KIT TURNOVER KIT A (KITS) IMPLANT
MANIFOLD NEPTUNE II (INSTRUMENTS) ×3 IMPLANT
PACK CYSTO (CUSTOM PROCEDURE TRAY) ×3 IMPLANT
SPONGE TONSIL TAPE 1 RFD (DISPOSABLE) ×2 IMPLANT
TUBING CONNECTING 10 (TUBING) ×2 IMPLANT
TUBING CONNECTING 10' (TUBING) ×1

## 2019-05-08 NOTE — Anesthesia Postprocedure Evaluation (Signed)
Anesthesia Post Note  Patient: [name]  Procedure(s) Performed: CYSTOSCOPY WITH RETROGRADE PYELOGRAM/URETERAL STENT PLACEMENT (Right )     Patient location during evaluation: PACU Anesthesia Type: General Level of consciousness: awake and alert, patient cooperative and oriented Pain management: pain level controlled Vital Signs Assessment: post-procedure vital signs reviewed and stable Respiratory status: spontaneous breathing, nonlabored ventilation and respiratory function stable Cardiovascular status: blood pressure returned to baseline and stable Postop Assessment: no apparent nausea or vomiting Anesthetic complications: no    Last Vitals:  Vitals:   05/08/19 1445 05/08/19 1500  BP: 120/61 115/65  Pulse: 81 80  Resp: 18 18  Temp:  37.7 C  SpO2: 100% 100%    Last Pain:  Vitals:   05/08/19 1500  TempSrc:   PainSc: 0-No pain                 Assia Meanor,E. Zayvon Alicea

## 2019-05-08 NOTE — H&P (Signed)
Urology Admission H&P  Chief Complaint: right flank pain  History of Present Illness: Ms Faley is a 44yo with a hx of nephrolithiasis here for right flank pain. She developed urinary urgency, frequency and dysuria 5 days ago. She developed sharp, constant, moderate to severe non radiating right flank pain 3 days ago and developed fevers to 101 yesterday. No other associated symptoms. She underwet CT stone study this morning at [facility] and was found to have a 4-75mm right UVJ calculus with moderate hydronephrosis. UA consistent with infection. No other exacerbating/alleviaitng events  Past Medical History:  Diagnosis Date  . Anxiety   . Eczema   . H/O varicella   . History of herpes genitalis   . History of kidney stones   . Hypokalemia 2014   body cramping not current issus  . Migraine   . Polycystic ovarian syndrome   . Right ureteral stone   . Seasonal allergies   . Ureteral calculi    right   Past Surgical History:  Procedure Laterality Date  . CYSTOSCOPY W/ URETERAL STENT PLACEMENT Left 07/01/2012   Procedure: CYSTOSCOPY WITH RETROGRADE PYELOGRAM/URETERAL STENT PLACEMENT;  Surgeon: Molli Hazard, MD;  Location: [facility];  Service: Urology;  Laterality: Left;  . CYSTOSCOPY W/ URETERAL STENT PLACEMENT Right 07/27/2016   Procedure: CYSTOSCOPY WITH RETROGRADE PYELOGRAM/URETERAL STENT PLACEMENT;  Surgeon: Bjorn Loser, MD;  Location: [facility];  Service: Urology;  Laterality: Right;  . CYSTOSCOPY WITH RETROGRADE PYELOGRAM, URETEROSCOPY AND STENT PLACEMENT Left 07/15/2012   Procedure: CYSTOSCOPY WITH left RETROGRADE PYELOGRAM,left  URETEROSCOPY AND left STENT exchange ;  Surgeon: Molli Hazard, MD;  Location: [facility];  Service: Urology;  Laterality: Left;  LEFT URETER STENT EXCHANGE    . CYSTOSCOPY WITH RETROGRADE PYELOGRAM, URETEROSCOPY AND STENT PLACEMENT Right 08/11/2016   Procedure: CYSTOSCOPY WITH RIGHT RETROGRADE PYELOGRAM,RIGHT  URETEROSCOPY AND STENT  CHANGE;  Surgeon: Kathie Rhodes, MD;  Location: [facility];  Service: Urology;  Laterality: Right;  . CYSTOSCOPY/RETROGRADE/URETEROSCOPY/STONE EXTRACTION WITH BASKET  AGE 48  . CYSTOSCOPY/URETEROSCOPY/HOLMIUM LASER/STENT PLACEMENT Bilateral 09/19/2018   Procedure: CYSTOSCOPY/RETROGRADE/URETEROSCOPY/HOLMIUM LASER/STENT PLACEMENT;  Surgeon: Festus Aloe, MD;  Location: [facility];  Service: Urology;  Laterality: Bilateral;  . CYSTOSCOPY/URETEROSCOPY/HOLMIUM LASER/STENT PLACEMENT Right 10/22/2018   Procedure: CYSTOSCOPY/RETROGRADE/URETEROSCOPY/HOLMIUM LASER/STENT PLACEMENT;  Surgeon: Festus Aloe, MD;  Location: [facility];  Service: Urology;  Laterality: Right;  ONLY NEEDS 60 MIN  . CYSTOSCOPY/URETEROSCOPY/HOLMIUM LASER/STENT PLACEMENT Right 02/11/2019   Procedure: CYSTOSCOPY/RETROGRADE/URETEROSCOPY/HOLMIUM LASER/STENT PLACEMENT;  Surgeon: Festus Aloe, MD;  Location: [facility];  Service: Urology;  Laterality: Right;  . HOLMIUM LASER APPLICATION Left 08/01/4740   Procedure: HOLMIUM LASER APPLICATION;  Surgeon: Molli Hazard, MD;  Location: [facility];  Service: Urology;  Laterality: Left;  . HOLMIUM LASER APPLICATION Right 5/95/6387   Procedure: HOLMIUM LASER APPLICATION;  Surgeon: Kathie Rhodes, MD;  Location: [facility];  Service: Urology;  Laterality: Right;  . HOLMIUM LASER APPLICATION Right 56/43/3295   Procedure: HOLMIUM LASER APPLICATION;  Surgeon: Festus Aloe, MD;  Location: [facility];  Service: Urology;  Laterality: Right;  . TONSILLECTOMY  AS CHILD    Home Medications:  Current Facility-Administered Medications  Medication Dose Route Frequency Provider Last Rate Last Admin  . lactated ringers infusion   Intravenous Continuous Annye Asa, MD 75 mL/hr at 05/08/19 1122 Restarted at 05/08/19 1213  . sodium chloride 0.9 % with cefTRIAXone (ROCEPHIN) ADS Med  Allergies:  Allergies  Allergen Reactions  . Adhesive [Tape] Rash    Pt states paper tape causes rash, but "plastic tape" is not problematic.     Family History  Problem Relation Age of Onset  . Thyroid disease Mother   . Depression Mother   . Cancer Father        leaukemia  . Thyroid disease Brother   . Diabetes Maternal Aunt   . Cancer Maternal Grandmother        GI  . Diabetes Maternal Grandfather   . Heart disease Paternal Grandmother    Social History:  reports that she has never smoked. She has never used smokeless tobacco. She reports current alcohol use. She reports that she does not use drugs.  Review of Systems  Constitutional: Positive for chills and fever.  Gastrointestinal: Positive for nausea.  Genitourinary: Positive for dysuria, flank pain, frequency and urgency.  All other systems reviewed and are negative.   Physical Exam:  Vital signs in last 24 hours: Temp:  [100.3 F (37.9 C)] 100.3 F (37.9 C) (01/07 1118) Pulse Rate:  [85] 85 (01/07 1118) Resp:  [18] 18 (01/07 1118) BP: (121)/(82) 121/82 (01/07 1118) SpO2:  [99 %] 99 % (01/07 1118) Weight:  [84.3 kg] 84.3 kg (01/07 1118) Physical Exam  Constitutional: She is oriented to person, place, and time. She appears well-developed and well-nourished.  HENT:  Head: Normocephalic and atraumatic.  Eyes: Pupils are equal, round, and reactive to light. EOM are normal.  Neck: No thyromegaly present.  Cardiovascular: Normal rate and regular rhythm.  Respiratory: Effort normal. No respiratory distress.  GI: Soft. She exhibits no distension.  Musculoskeletal:        General: No edema. Normal range of motion.     Cervical back: Normal range of motion.  Neurological: She is alert and oriented to person, place, and time.  Skin: Skin is warm and dry.  Psychiatric: She has a normal mood and affect. Her behavior is normal. Judgment and thought content normal.    Laboratory Data:  Results for orders placed or  performed during the hospital encounter of 05/08/19 (from the past 24 hour(s))  Pregnancy, urine     Status: None   Collection Time: 05/08/19 10:22 AM  Result Value Ref Range   Preg Test, Ur NEGATIVE NEGATIVE  CBC     Status: Abnormal   Collection Time: 05/08/19 10:25 AM  Result Value Ref Range   WBC 6.3 4.0 - 10.5 K/uL   RBC 4.66 3.87 - 5.11 MIL/uL   Hemoglobin 11.1 (L) 12.0 - 15.0 g/dL   HCT 65.9 (L) 93.5 - 70.1 %   MCV 76.8 (L) 80.0 - 100.0 fL   MCH 23.8 (L) 26.0 - 34.0 pg   MCHC 31.0 30.0 - 36.0 g/dL   RDW 77.9 (H) 39.0 - 30.0 %   Platelets 151 150 - 400 K/uL   nRBC 0.0 0.0 - 0.2 %  Respiratory Panel by RT PCR (Flu A&B, Covid) - Nasopharyngeal Swab     Status: None   Collection Time: 05/08/19 10:28 AM   Specimen: Nasopharyngeal Swab  Result Value Ref Range   SARS Coronavirus 2 by RT PCR NEGATIVE NEGATIVE   Influenza A by PCR NEGATIVE NEGATIVE   Influenza B by PCR NEGATIVE NEGATIVE   Recent Results (from the past 240 hour(s))  Respiratory Panel by RT PCR (Flu A&B, Covid) - Nasopharyngeal Swab     Status: None   Collection Time: 05/08/19 10:28 AM  Specimen: Nasopharyngeal Swab  Result Value Ref Range Status   SARS Coronavirus 2 by RT PCR NEGATIVE NEGATIVE Final    Comment: (NOTE) SARS-CoV-2 target nucleic acids are NOT DETECTED. The SARS-CoV-2 RNA is generally detectable in upper respiratoy specimens during the acute phase of infection. The lowest concentration of SARS-CoV-2 viral copies this assay can detect is 131 copies/mL. A negative result does not preclude SARS-Cov-2 infection and should not be used as the sole basis for treatment or other patient management decisions. A negative result may occur with  improper specimen collection/handling, submission of specimen other than nasopharyngeal swab, presence of viral mutation(s) within the areas targeted by this assay, and inadequate number of viral copies (<131 copies/mL). A negative result must be combined with  clinical observations, patient history, and epidemiological information. The expected result is Negative. Fact Sheet for Patients:  https://www.moore.com/ Fact Sheet for Healthcare Providers:  https://www.young.biz/ This test is not yet ap proved or cleared by the [place] FDA and  has been authorized for detection and/or diagnosis of SARS-CoV-2 by FDA under an Emergency Use Authorization (EUA). This EUA will remain  in effect (meaning this test can be used) for the duration of the COVID-19 declaration under Section 564(b)(1) of the Act, 21 U.S.C. section 360bbb-3(b)(1), unless the authorization is terminated or revoked sooner.    Influenza A by PCR NEGATIVE NEGATIVE Final   Influenza B by PCR NEGATIVE NEGATIVE Final    Comment: (NOTE) The Xpert Xpress SARS-CoV-2/FLU/RSV assay is intended as an aid in  the diagnosis of influenza from Nasopharyngeal swab specimens and  should not be used as a sole basis for treatment. Nasal washings and  aspirates are unacceptable for Xpert Xpress SARS-CoV-2/FLU/RSV  testing. Fact Sheet for Patients: https://www.moore.com/ Fact Sheet for Healthcare Providers: https://www.young.biz/ This test is not yet approved or cleared by the [place] FDA and  has been authorized for detection and/or diagnosis of SARS-CoV-2 by  FDA under an Emergency Use Authorization (EUA). This EUA will remain  in effect (meaning this test can be used) for the duration of the  Covid-19 declaration under Section 564(b)(1) of the Act, 21  U.S.C. section 360bbb-3(b)(1), unless the authorization is  terminated or revoked. Performed at [facility], [address], [place], [place] [address]    Creatinine: No results for input(s): CREATININE in the last 168 hours. Baseline Creatinine: unknown  Impression/Assessment:  43yo with right ureteral calculus, concern for  sepsis  Plan:  The risks/benefits/alternatives to right ureteral stent placement was explained to the patient and she understands and wishes to proceed with surgery. She will be admitted after her procedure for IV antibiotics and CV monitoring  Wilkie Aye 05/08/2019, 1:02 PM

## 2019-05-08 NOTE — Transfer of Care (Signed)
Immediate Anesthesia Transfer of Care Note  Patient: Phyllis House  Procedure(s) Performed: CYSTOSCOPY WITH RETROGRADE PYELOGRAM/URETERAL STENT PLACEMENT (Right )  Patient Location: PACU  Anesthesia Type:General  Level of Consciousness: awake, alert  and patient cooperative  Airway & Oxygen Therapy: Patient Spontanous Breathing and Patient connected to face mask oxygen  Post-op Assessment: Report given to RN and Post -op Vital signs reviewed and stable  Post vital signs: Reviewed and stable  Last Vitals:  Vitals Value Taken Time  BP 128/70 05/08/19 1407  Temp    Pulse 89 05/08/19 1408  Resp 22 05/08/19 1408  SpO2 100 % 05/08/19 1408  Vitals shown include unvalidated device data.  Last Pain:  Vitals:   05/08/19 1118  TempSrc: Oral  PainSc: 0-No pain         Complications: No apparent anesthesia complications

## 2019-05-08 NOTE — Anesthesia Preprocedure Evaluation (Addendum)
Anesthesia Evaluation  Patient identified by MRN, date of birth, ID band Patient awake    Reviewed: Allergy & Precautions, NPO status , Patient's Chart, lab work & pertinent test results  History of Anesthesia Complications Negative for: history of anesthetic complications  Airway Mallampati: I  TM Distance: >3 FB Neck ROM: Full    Dental  (+) Chipped, Dental Advisory Given   Pulmonary  05/08/2019 SARS coronavirus NEG   breath sounds clear to auscultation       Cardiovascular negative cardio ROS   Rhythm:Regular Rate:Normal     Neuro/Psych  Headaches, Anxiety    GI/Hepatic Neg liver ROS, N/v with stones   Endo/Other  polycystic ovarian  Renal/GU stones     Musculoskeletal   Abdominal (+) + obese,   Peds  Hematology negative hematology ROS (+)   Anesthesia Other Findings   Reproductive/Obstetrics Mirena                            Anesthesia Physical Anesthesia Plan  ASA: II  Anesthesia Plan: General   Post-op Pain Management:    Induction: Intravenous  PONV Risk Score and Plan: 3 and Ondansetron, Dexamethasone and Scopolamine patch - Pre-op  Airway Management Planned: Oral ETT  Additional Equipment:   Intra-op Plan:   Post-operative Plan: Extubation in OR  Informed Consent: I have reviewed the patients History and Physical, chart, labs and discussed the procedure including the risks, benefits and alternatives for the proposed anesthesia with the patient or authorized representative who has indicated his/her understanding and acceptance.     Dental advisory given  Plan Discussed with: CRNA and Surgeon  Anesthesia Plan Comments:         Anesthesia Quick Evaluation

## 2019-05-08 NOTE — Op Note (Signed)
.  Preoperative diagnosis: right UVJ stone, sepsis  Postoperative diagnosis: Same  Procedure: 1 cystoscopy 2. right retrograde pyelography 3.  Intraoperative fluoroscopy, under one hour, with interpretation 4. right 6 x 24 JJ stent placement  Attending: Wilkie Aye  Anesthesia: General  Estimated blood loss: None  Drains: Right 6 x 24 JJ ureteral stent without tether, 16 French foley catheter  Specimens: none  Antibiotics: Rocephin  Findings:right UVJ stone. Moderate hydronephrosis. No masses/lesions in the bladder. Ureteral orifices in normal anatomic location.  Indications: Patient is a 44 year old female with a history of right ureteral stone and concern for sepsis.  After discussing treatment options, they decided proceed with right stent placement.  Procedure her in detail: The patient was brought to the operating room and a brief timeout was done to ensure correct patient, correct procedure, correct site.  General anesthesia was administered patient was placed in dorsal lithotomy position.  Their genitalia was then prepped and draped in usual sterile fashion.  A rigid 22 French cystoscope was passed in the urethra and the bladder.  Bladder was inspected free masses or lesions.  the ureteral orifices were in the normal orthotopic locations.  a 6 french ureteral catheter was then instilled into the right ureteral orifice.  a gentle retrograde was obtained and findings noted above.  we then placed a zip wire through the ureteral catheter and advanced up to the renal pelvis.    We then placed a 6 x 26 double-j ureteral stent over the original zip wire.  We then removed the wire and good coil was noted in the the renal pelvis under fluoroscopy and the bladder under direct vision.  A foley catheter was then placed. the bladder was then drained and this concluded the procedure which was well tolerated by patient.  Complications: None  Condition: Stable, extubated, transferred to  PACU  Plan: Patient is to be admitted for IV antibiotics. She will have her stone extraction in 2 weeks.

## 2019-05-08 NOTE — Anesthesia Procedure Notes (Signed)
Procedure Name: Intubation Date/Time: 05/08/2019 1:43 PM Performed by: Lavina Hamman, CRNA Pre-anesthesia Checklist: Patient identified, Emergency Drugs available, Suction available and Patient being monitored Patient Re-evaluated:Patient Re-evaluated prior to induction Oxygen Delivery Method: Circle System Utilized Preoxygenation: Pre-oxygenation with 100% oxygen Induction Type: IV induction, Rapid sequence and Cricoid Pressure applied Laryngoscope Size: 3 and Mac Grade View: Grade I Tube type: Oral Tube size: 7.0 mm Number of attempts: 1 Airway Equipment and Method: Stylet and Oral airway Placement Confirmation: ETT inserted through vocal cords under direct vision,  positive ETCO2 and breath sounds checked- equal and bilateral Secured at: 21 cm Tube secured with: Tape Dental Injury: Teeth and Oropharynx as per pre-operative assessment

## 2019-05-09 DIAGNOSIS — N132 Hydronephrosis with renal and ureteral calculous obstruction: Secondary | ICD-10-CM | POA: Diagnosis not present

## 2019-05-09 LAB — BASIC METABOLIC PANEL
Anion gap: 8 (ref 5–15)
BUN: 13 mg/dL (ref 6–20)
CO2: 16 mmol/L — ABNORMAL LOW (ref 22–32)
Calcium: 8.3 mg/dL — ABNORMAL LOW (ref 8.9–10.3)
Chloride: 112 mmol/L — ABNORMAL HIGH (ref 98–111)
Creatinine, Ser: 0.72 mg/dL (ref 0.44–1.00)
GFR calc Af Amer: >60 mL/min (ref >60)
GFR calc non Af Amer: >60 mL/min (ref >60)
Glucose, Bld: 126 mg/dL — ABNORMAL HIGH (ref 70–99)
Potassium: 4.2 mmol/L (ref 3.5–5.1)
Sodium: 136 mmol/L (ref 135–145)

## 2019-05-09 LAB — CBC
HCT: 32.8 % — ABNORMAL LOW (ref 36.0–46.0)
Hemoglobin: 10 g/dL — ABNORMAL LOW (ref 12.0–15.0)
MCH: 23.8 pg — ABNORMAL LOW (ref 26.0–34.0)
MCHC: 30.5 g/dL (ref 30.0–36.0)
MCV: 77.9 fL — ABNORMAL LOW (ref 80.0–100.0)
Platelets: 146 10*3/uL — ABNORMAL LOW (ref 150–400)
RBC: 4.21 MIL/uL (ref 3.87–5.11)
RDW: 15.7 % — ABNORMAL HIGH (ref 11.5–15.5)
WBC: 4.9 10*3/uL (ref 4.0–10.5)
nRBC: 0 % (ref 0.0–0.2)

## 2019-05-09 MED ORDER — ONDANSETRON 4 MG PO TBDP: 4.0000 mg | ORAL_TABLET | Freq: Three times a day (TID) | ORAL | 1 refills | Status: DC | PRN

## 2019-05-09 MED ORDER — OXYCODONE-ACETAMINOPHEN 5-325 MG PO TABS: 1.0000 | ORAL_TABLET | ORAL | 0 refills | Status: DC | PRN

## 2019-05-09 MED ORDER — CEFPODOXIME PROXETIL 200 MG PO TABS: 200.0000 mg | ORAL_TABLET | Freq: Two times a day (BID) | ORAL | 0 refills | Status: DC

## 2019-05-09 NOTE — Discharge Instructions (Signed)

## 2019-05-09 NOTE — Progress Notes (Signed)
Patient's foley catheter removed this morning at 0836.  Will continue to monitor.

## 2019-05-09 NOTE — Progress Notes (Signed)
Nsg Discharge Note  Admit Date:  05/08/2019 Discharge date: 05/09/2019   Phyllis House to be D/C'd Home per MD order.  AVS completed.  Copy for chart, and copy for patient signed, and dated. Patient/caregiver able to verbalize understanding.  Discharge Medication: Allergies as of 05/09/2019      Reactions   Adhesive [tape] Rash   Pt states paper tape causes rash, but "plastic tape" is not problematic.       Medication List    TAKE these medications   acetaminophen 500 MG tablet Commonly known as: TYLENOL Take 1,000 mg by mouth every 6 (six) hours as needed for moderate pain.   cefpodoxime 200 MG tablet Commonly known as: VANTIN Take 1 tablet (200 mg total) by mouth 2 (two) times daily.   HYDROcodone-acetaminophen 5-325 MG tablet Commonly known as: Norco Take 1 tablet by mouth every 4 (four) hours as needed (for pain).   ibuprofen 200 MG tablet Commonly known as: ADVIL Take 400 mg by mouth every 6 (six) hours as needed for fever.   levonorgestrel 20 MCG/24HR IUD Commonly known as: MIRENA 1 each by Intrauterine route once. February 2014   ondansetron 4 MG disintegrating tablet Commonly known as: Zofran ODT Take 1 tablet (4 mg total) by mouth every 8 (eight) hours as needed for nausea or vomiting.   ondansetron 4 MG tablet Commonly known as: ZOFRAN Take 4 mg by mouth every 8 (eight) hours as needed for nausea or vomiting.   oxyCODONE-acetaminophen 5-325 MG tablet Commonly known as: PERCOCET/ROXICET Take 1-2 tablets by mouth every 4 (four) hours as needed for moderate pain.   promethazine 25 MG suppository Commonly known as: PHENERGAN Place 1 suppository (25 mg total) rectally every 6 (six) hours as needed for nausea or vomiting.       Discharge Assessment: Vitals:   05/09/19 1049 05/09/19 1410  BP: 99/65 115/75  Pulse: 65 73  Resp: 19   Temp: 98.3 F (36.8 C) 98.2 F (36.8 C)  SpO2: 100% 100%   Skin clean, dry and intact without evidence of skin break  down, no evidence of skin tears noted. IV catheter discontinued intact. Site without signs and symptoms of complications - no redness or edema noted at insertion site, patient denies c/o pain - only slight tenderness at site.  Dressing with slight pressure applied.  D/c Instructions-Education: Discharge instructions given to patient/family with verbalized understanding. D/c education completed with patient/family including follow up instructions, medication list, d/c activities limitations if indicated, with other d/c instructions as indicated by MD - patient able to verbalize understanding, all questions fully answered. Patient instructed to return to ED, call 911, or call MD for any changes in condition.  Patient escorted via WC, and D/C home via private auto.  Robert Bellow, RN 05/09/2019 2:16 PM

## 2019-05-12 NOTE — Discharge Summary (Signed)
Physician Discharge Summary  Patient ID: Phyllis House MRN: [id number] DOB/AGE: 1976/04/04 44 y.o.  Admit date: 05/08/2019 Discharge date: 05/09/2019 Admission Diagnoses:  Ureteral calculus, sepsis  Discharge Diagnoses:  Active Problems:   Hydronephrosis concurrent with and due to calculi of kidney and ureter   Past Medical History:  Diagnosis Date  . Anxiety   . Eczema   . H/O varicella   . History of herpes genitalis   . History of kidney stones   . Hypokalemia 2014   body cramping not current issus  . Migraine   . Polycystic ovarian syndrome   . Right ureteral stone   . Seasonal allergies   . Ureteral calculi    right    Surgeries: Procedure(s): CYSTOSCOPY WITH RETROGRADE PYELOGRAM/URETERAL STENT PLACEMENT on 05/08/2019   Consultants (if any):   Discharged Condition: Improved  Hospital Course: Phyllis House is an 44 y.o. female who was admitted 05/08/2019 with a diagnosis of <principal problem not specified> and went to the operating room on 05/08/2019 and underwent the above named procedures.    She was given perioperative antibiotics:  Anti-infectives (From admission, onward)   Start     Dose/Rate Route Frequency Ordered Stop   05/09/19 1200  cefTRIAXone (ROCEPHIN) 2 g in sodium chloride 0.9 % 100 mL IVPB  Status:  Discontinued     2 g 200 mL/hr over 30 Minutes Intravenous Every 24 hours 05/08/19 1609 05/09/19 1935   05/09/19 0000  cefpodoxime (VANTIN) 200 MG tablet     200 mg Oral 2 times daily 05/09/19 1344     05/08/19 1310  cefTRIAXone (ROCEPHIN) 2 g in sodium chloride 0.9 % 100 mL IVPB     2 g 200 mL/hr over 30 Minutes Intravenous 30 min pre-op 05/08/19 1310 05/08/19 1336   05/08/19 1038  sodium chloride 0.9 % with cefTRIAXone (ROCEPHIN) ADS Med    Note to Pharmacy: Linard Millers   : cabinet override      05/08/19 1038 05/08/19 1339    .  She was given sequential compression devices, early ambulation for DVT prophylaxis.  She benefited maximally  from the hospital stay and there were no complications.    Recent vital signs:  Vitals:   05/09/19 1049 05/09/19 1410  BP: 99/65 115/75  Pulse: 65 73  Resp: 19   Temp: 98.3 F (36.8 C) 98.2 F (36.8 C)  SpO2: 100% 100%    Recent laboratory studies:  Lab Results  Component Value Date   HGB 10.0 (L) 05/09/2019   HGB 10.2 (L) 05/08/2019   HGB 11.1 (L) 05/08/2019   Lab Results  Component Value Date   WBC 4.9 05/09/2019   PLT 146 (L) 05/09/2019   No results found for: INR Lab Results  Component Value Date   NA 136 05/09/2019   K 4.2 05/09/2019   CL 112 (H) 05/09/2019   CO2 16 (L) 05/09/2019   BUN 13 05/09/2019   CREATININE 0.72 05/09/2019   GLUCOSE 126 (H) 05/09/2019    Discharge Medications:   Allergies as of 05/09/2019      Reactions   Adhesive [tape] Rash   Pt states paper tape causes rash, but "plastic tape" is not problematic.       Medication List    TAKE these medications   acetaminophen 500 MG tablet Commonly known as: TYLENOL Take 1,000 mg by mouth every 6 (six) hours as needed for moderate pain.   cefpodoxime 200 MG tablet Commonly known as: VANTIN Take 1 tablet (200  mg total) by mouth 2 (two) times daily.   HYDROcodone-acetaminophen 5-325 MG tablet Commonly known as: Norco Take 1 tablet by mouth every 4 (four) hours as needed (for pain).   ibuprofen 200 MG tablet Commonly known as: ADVIL Take 400 mg by mouth every 6 (six) hours as needed for fever.   levonorgestrel 20 MCG/24HR IUD Commonly known as: MIRENA 1 each by Intrauterine route once. February 2014   ondansetron 4 MG disintegrating tablet Commonly known as: Zofran ODT Take 1 tablet (4 mg total) by mouth every 8 (eight) hours as needed for nausea or vomiting.   ondansetron 4 MG tablet Commonly known as: ZOFRAN Take 4 mg by mouth every 8 (eight) hours as needed for nausea or vomiting.   oxyCODONE-acetaminophen 5-325 MG tablet Commonly known as: PERCOCET/ROXICET Take 1-2 tablets by  mouth every 4 (four) hours as needed for moderate pain.   promethazine 25 MG suppository Commonly known as: PHENERGAN Place 1 suppository (25 mg total) rectally every 6 (six) hours as needed for nausea or vomiting.       Diagnostic Studies: DG C-Arm 1-60 Min-No Report  Result Date: 05/08/2019 Fluoroscopy was utilized by the requesting physician.  No radiographic interpretation.    Disposition: Discharge disposition: 01-Home or Self Care         Follow-up Information    Cigi Bega, Candee Furbish, MD. Call.   Specialty: Urology Contact information: [place] [id number] [phone]            Signed: Nicolette Bang 05/12/2019, 1:55 PM

## 2019-05-13 ENCOUNTER — Other Ambulatory Visit: Payer: Self-pay | Admitting: Urology

## 2019-05-28 ENCOUNTER — Encounter (HOSPITAL_BASED_OUTPATIENT_CLINIC_OR_DEPARTMENT_OTHER): Payer: Self-pay | Admitting: Urology

## 2019-05-28 ENCOUNTER — Other Ambulatory Visit: Payer: Self-pay

## 2019-05-28 NOTE — Progress Notes (Signed)
Spoke w/ via phone for pre-op interview--- PT Lab needs dos---- Urine preg             Lab results------ bmp, cbc results done 05-09-2019 in epic COVID test ------ 05-30-2019 @ 1510 Arrive at ------- MN NPO after ------ MN Medications to take morning of surgery ----- If needed may take oxycodone/ zofran w/ sips of water Diabetic medication ----- n/a Patient Special Instructions ----- n/a Pre-Op special Istructions ----- n/a Patient verbalized understanding of instructions that were given at this phone interview. Patient denies shortness of breath, chest pain, fever, cough a this phone interview.

## 2019-05-30 ENCOUNTER — Other Ambulatory Visit (HOSPITAL_COMMUNITY)
Admission: RE | Admit: 2019-05-30 | Discharge: 2019-05-30 | Disposition: A | Discharge status: Home / Self Care | Payer: BC Managed Care – PPO | Source: Ambulatory Visit | Attending: Urology | Admitting: Urology

## 2019-05-30 DIAGNOSIS — Z01812 Encounter for preprocedural laboratory examination: Secondary | ICD-10-CM | POA: Diagnosis not present

## 2019-05-30 DIAGNOSIS — Z20822 Contact with and (suspected) exposure to covid-19: Secondary | ICD-10-CM | POA: Diagnosis not present

## 2019-05-30 LAB — SARS CORONAVIRUS 2 (TAT 6-24 HRS): SARS Coronavirus 2: NEGATIVE

## 2019-06-02 NOTE — H&P (Signed)
Office Visit Report     05/08/2019   --------------------------------------------------------------------------------   Phyllis House  MRN: [id number]  DOB: 1975-12-10, 44 year old Female  SSN: [id number]   PRIMARY CARE:  MEDICAID [place] [facility] [place]  REFERRING:    PROVIDER:  Festus Aloe, M.D.  TREATING:  Azucena Fallen, NP  LOCATION:  [facility] [phone]     --------------------------------------------------------------------------------   CC: I have kidney stones.  HPI: Phyllis House is a 44 year-old female established patient who is here for renal calculi.  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 Prior serum evaluation with PTH, calcium, uric acid and electrolytes was normal. Tried: HCTZ 25mg - induced symptomatic hypokalemia despite being on Kcitrate. Also, Kcit 20 meq BID caused GI upset.   Right URS Apr 2018 with Dr. Karsten Ro. Repeat labs were done which included normal serum labs. 24-hour urine revealed very low volume, extreme hypocitraturia. Her urine Ca was normal. Urine pH 6.6 - 7.2.   She underwent bilateral ureteroscopy in 01/2018, with the right side being a staged procedure due to 2 large distal right ureteral stones, which were impacted. She did not follow up, as planned, after that procedure.   03/2019: She returned with r>L pain and CT revealed several right distal stone and a small left distal stone. She was taken for bilateral URS 09/19/2018. She needed another right URS Oct 2020. Limited right [place] today   05/08/19: Patient with significant past stone history. Most recent ureteroscopy was performed in October 2020. She was referred to [facility] for further evaluation and she did have Telehealth visit with Dr. Aleene Davidson. She presents today with complaints of right lower quadrant pain, right flank pain and lower urinary tract  symptoms which began last week. She initially noted increased urinary frequency, urgency, and dysuria. She is now experiencing right flank and right lower quadrant pain, as well. She has been febrile for the past 2 days with highest measurable fever of 101. Her temperature last night per her report was 100.5. She is afebrile in clinic currently, but has been using both Tylenol and NSAIDs for pain management. She complains of nausea, but denies vomiting. She complains of general malaise and lethargy today, as well.   The problem is on both sides. She first stated noticing pain on approximately 12/31/2006. This is not her first kidney stone. She is currently having flank pain, groin pain, and nausea. She denies having back pain, vomiting, fever, and chills. She has caught a stone in her urine strainer since her symptoms began.   She has had ureteral stent and ureteroscopy for treatment of her stones in the past.     ALLERGIES: No Allergies    MEDICATIONS: Advil  Aleve  Hydrocodone-Acetaminophen 5 mg-325 mg tablet 1 tablet PO Q 6 H PRN  Ibuprofen  Litholite  Ondansetron Hcl 4 mg tablet 1 tablet PO Q 8 H PRN  Tylenol     GU PSH: Cysto Remove Stent FB Sim - 09/25/2018 Cysto Uretero Lithotripsy, Right - 10/22/2018, 2014 Cystoscopy And Treatment, Right - 02/22/2018, 2008 Cystoscopy Insert Stent, Right - 02/22/2018, Left - 02/08/2018, Right - 2018, 2014, 2014 Ureteroscopic laser litho, Right - 02/11/2019, Right - 10/22/2018, Bilateral - 09/19/2018, Left - 02/22/2018, Right - 02/08/2018, Right - 2018 Ureteroscopic stone removal, Right - 2018       PSH Notes: Cystoscopy With Ureteroscopy With Lithotripsy, Cystoscopy With Insertion Of Ureteral Stent  Left, Cystoscopy With Insertion Of Ureteral Stent Left, Cystoscopy With Manipulation Of Ureteral Calculus   NON-GU PSH: None   GU PMH: Renal calculus - 03/04/2019, - 12/03/2018, - 09/30/2018, - 09/27/2018, - 09/25/2018, - 09/11/2018, - 01/16/2017, Nephrolithiasis,  - 2015 Flank Pain - 02/03/2019, - 01/17/2019, - 12/24/2018, - 09/11/2018 Acute Cystitis/UTI - 09/27/2018 Gross hematuria - 09/11/2018 LLQ pain - 09/11/2018 RLQ pain - 09/11/2018 Mixed incontinence, Mixed stress and urge urinary incontinence - 2015 Urinary Tract Inf, Unspec site, Urinary tract infection - 2015, Pyuria, - 2015 Hydronephrosis Unspec, Hydronephrosis On The Right - 2014 Ureteral calculus, Ureteral Stone - 2014    NON-GU PMH: Bacteriuria - 2018 Encounter for general adult medical examination without abnormal findings, Encounter for preventive health examination - 2015 Hypokalemia, Hypokalemia - 2015 Hypercalciuria, Hypercalciuria - 2014 Personal history of other diseases of the nervous system and sense organs, History of migraine headaches - 2014 Personal history of other specified conditions, History of nausea - 2014    FAMILY HISTORY: Family Health Status Number - Runs In Family Father Deceased At Age17 ___ - Runs In Family nephrolithiasis - Uncle, Mother, Grandfather, Aunt   SOCIAL HISTORY: Marital Status: Single Preferred Language: English; Ethnicity: Not Hispanic Or Latino; Race: White Current Smoking Status: Patient has never smoked.   Tobacco Use Assessment Completed: Used Tobacco in last 30 days? Has never drank.  Drinks 3 caffeinated drinks per day. 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     Notes: Never A Smoker, Drug Use, Caffeine Use, Marital History - Single, Alcohol Use   REVIEW OF SYSTEMS:    GU Review Female:   Patient reports frequent urination, hard to postpone urination, and burning /pain with urination. Patient denies get up at night to urinate, leakage of urine, stream starts and stops, trouble starting your stream, have to strain to urinate, and being pregnant.  Gastrointestinal (Upper):   Patient reports nausea. Patient denies vomiting and indigestion/ heartburn.  Gastrointestinal (Lower):   Patient denies diarrhea and constipation.   Constitutional:   Patient reports fever. Patient denies night sweats, weight loss, and fatigue.  Skin:   Patient denies skin rash/ lesion and itching.  Eyes:   Patient denies blurred vision and double vision.  Ears/ Nose/ Throat:   Patient denies sore throat and sinus problems.  Hematologic/Lymphatic:   Patient denies swollen glands and easy bruising.  Cardiovascular:   Patient denies leg swelling and chest pains.  Respiratory:   Patient denies cough and shortness of breath.  Endocrine:   Patient denies excessive thirst.  Musculoskeletal:   Patient denies back pain and joint pain.  Neurological:   Patient denies headaches and dizziness.  Psychologic:   Patient denies depression and anxiety.   VITAL SIGNS:      05/08/2019 08:15 AM  BP 135/66 mmHg  Pulse 78 /min  Temperature 98.0 F / 36.6 C   MULTI-SYSTEM PHYSICAL EXAMINATION:    Constitutional: Well-nourished. No physical deformities. Normally developed. Good grooming.  Respiratory: Normal breath sounds. No labored breathing, no use of accessory muscles.   Cardiovascular: Regular rate and rhythm. No murmur, no gallop. Normal temperature, normal extremity pulses, no swelling, no varicosities.   Neurologic / Psychiatric: Oriented to time, oriented to place, oriented to person. No depression, no anxiety, no agitation.  Gastrointestinal: No mass, no tenderness, no rigidity, non obese abdomen. Mild RLQ tenderness.   Musculoskeletal: Normal gait and station of head and neck.     PAST DATA REVIEWED:  Source Of History:  Patient  Records  Review:   Previous Patient Records  Urine Test Review:   Urinalysis, Urine Culture  X-Ray Review: KUB: Reviewed Films.  C.T. Abdomen/Pelvis: Reviewed Films. Reviewed Report.     PROCEDURES:         C.T. Urogram - [id number]  IMPRESSION:  1. In addition to multiple large nonobstructive calculi in the  collecting systems of both kidneys there is a 5 mm calculus in the  distal third of the right ureter  shortly before the right  ureterovesicular junction with moderate proximal  hydroureteronephrosis indicative of obstruction.  2. Additional incidental findings, as above.        Patient confirmed No Neulasta OnPro Device.           Urinalysis w/Scope Dipstick Dipstick Cont'd Micro  Color: Amber Bilirubin: Neg mg/dL WBC/hpf: >78/EUM  Appearance: Turbid Ketones: Neg mg/dL RBC/hpf: 40 - 35/TIR  Specific Gravity: 1.020 Blood: 3+ ery/uL Bacteria: Many (>50/hpf)  pH: 7.0 Protein: 3+ mg/dL Cystals: NS (Not Seen)  Glucose: Neg mg/dL Urobilinogen: 0.2 mg/dL Casts: NS (Not Seen)    Nitrites: Positive Trichomonas: Not Present    Leukocyte Esterase: 3+ leu/uL Mucous: Not Present      Epithelial Cells: 0 - 5/hpf      Yeast: NS (Not Seen)      Sperm: Not Present    Notes: MICROSCOPIC NOT CONCENTRATED          Ketoralac 30mg  - , [id number] The injection site was sterilely prepped with alcohol. Ketoralac was injected (IM) using standard technique. The patient tolerated the procedure well.   Qty: 30 Adm. By: [id number]  Unit: mg Lot No Julien Nordmann  Route: IM Exp. Date 08/28/2020  Freq: None Mfgr.:   Site: Left Buttock   ASSESSMENT:      ICD-10 Details  1 GU:   Renal calculus - N20.0 Right, Chronic, Exacerbation  2   Acute Cystitis/UTI - N30.00 Undiagnosed New Problem   PLAN:            Medications Stop Meds: Hydrocodone-Acetaminophen 5 mg-325 mg tablet 1 tablet PO Q 6 H PRN  Start: 01/28/2019  Discontinue: 05/08/2019  - Reason: The medication cycle was completed.  Ondansetron Hcl 4 mg tablet 1 tablet PO Q 8 H PRN  Start: 01/28/2019  Discontinue: 05/08/2019  - Reason: The medication cycle was completed.            Orders Labs Urine Culture  X-Rays: C.T. Stone Protocol Without Contrast  X-Ray Notes: History:  Hematuria: Yes/No  Patient to see MD after exam: Yes/No  Previous exam: CT / IVP/ US/ KUB/ None  When:  Where:  Diabetic: Yes/ No  BUN/ Creatinine:  Date of  last BUN Creatinine:  Weight in pounds:  Allergy- IV Contrast: Yes/ No  Conflicting diabetic meds: Yes/ No  Diabetic Meds:  Prior Authorization #: [id number]          Schedule Procedure: 05/08/2019 at [facility] - [phone] - Ketoralac 30mg  (Toradol Per 15 Mg) - [id number], [phone]          Document Letter(s):  Created for Patient: Clinical Summary         Notes:   Urinalysis today significant for pyuria and bacteriuria. I will send for urine culture. CT imaging today shows an approximately 4-5 mm calculus in the right distal ureter with moderate right hydroureteronephrosis. Multiple nonobstructing stones noted bilaterally, as well. Case reviewed with the on-call urologist. Will plan to move forward with cystoscopy, right retrograde pyelogram, right ureteral  stent placement later today. I reviewed the procedure and risks with the patient in detail. She was given 30 mg Toradol in clinic for pain. She will remain NPO at this point in report to [facility] for her procedure.        Next Appointment:      Next Appointment: 06/04/2019 07:45 AM    Appointment Type: KUB    Location: [facility] [phone]    Provider: KUB KUB    Reason for Visit: [id number]      * Signed by Vianne Bulls, NP on 05/08/19 at 5:11 PM (EST)*       APPENDED NOTES:  She was seen at [facility] and they will get her back with dietician and nephrology.     * Signed by [name], M.D. on 06/02/19 at 10:13 AM (EST)*  APPENDED NOTES:   Pt also started nightly cephalexin for pansensitive ecoli + cx.      The information contained in this medical record document is considered private and confidential patient information. This information can only be used for the medical diagnosis and/or medical services that are being provided by the patient's selected caregivers. This information can only be distributed outside of the patient's care if the patient agrees and signs  waivers of authorization for this information to be sent to an outside source or route.

## 2019-06-02 NOTE — Anesthesia Preprocedure Evaluation (Addendum)
Anesthesia Evaluation  Patient identified by MRN, date of birth, ID band Patient awake    Reviewed: Allergy & Precautions, NPO status , Patient's Chart, lab work & pertinent test results  Airway Mallampati: II  TM Distance: >3 FB Neck ROM: Full    Dental no notable dental hx. (+) Teeth Intact, Dental Advisory Given   Pulmonary neg pulmonary ROS,    Pulmonary exam normal breath sounds clear to auscultation       Cardiovascular negative cardio ROS Normal cardiovascular exam Rhythm:Regular Rate:Normal     Neuro/Psych  Headaches, negative psych ROS   GI/Hepatic negative GI ROS, Neg liver ROS,   Endo/Other  negative endocrine ROS  Renal/GU R renal calculus K+ 4.2 Cr 0.72     Musculoskeletal negative musculoskeletal ROS (+)   Abdominal (+) + obese,   Peds  Hematology Hgb 10 Plt 146   Anesthesia Other Findings   Reproductive/Obstetrics negative OB ROS                           Anesthesia Physical Anesthesia Plan  ASA: II  Anesthesia Plan: General   Post-op Pain Management:    Induction: Intravenous  PONV Risk Score and Plan: 4 or greater and Treatment may vary due to age or medical condition, Ondansetron, Dexamethasone, Midazolam and Scopolamine patch - Pre-op  Airway Management Planned: LMA  Additional Equipment: None  Intra-op Plan:   Post-operative Plan:   Informed Consent: I have reviewed the patients History and Physical, chart, labs and discussed the procedure including the risks, benefits and alternatives for the proposed anesthesia with the patient or authorized representative who has indicated his/her understanding and acceptance.     Dental advisory given  Plan Discussed with:   Anesthesia Plan Comments:        Anesthesia Quick Evaluation

## 2019-06-03 ENCOUNTER — Ambulatory Visit (HOSPITAL_BASED_OUTPATIENT_CLINIC_OR_DEPARTMENT_OTHER): Payer: BC Managed Care – PPO | Admitting: Anesthesiology

## 2019-06-03 ENCOUNTER — Other Ambulatory Visit: Payer: Self-pay

## 2019-06-03 ENCOUNTER — Ambulatory Visit (HOSPITAL_BASED_OUTPATIENT_CLINIC_OR_DEPARTMENT_OTHER)
Admission: RE | Admit: 2019-06-03 | Discharge: 2019-06-03 | Disposition: A | Discharge status: Home / Self Care | Payer: BC Managed Care – PPO | Attending: Urology | Admitting: Urology

## 2019-06-03 ENCOUNTER — Encounter (HOSPITAL_BASED_OUTPATIENT_CLINIC_OR_DEPARTMENT_OTHER)
Admission: RE | Disposition: A | Discharge status: Home / Self Care | Payer: Self-pay | Source: Home / Self Care | Attending: Urology

## 2019-06-03 ENCOUNTER — Encounter (HOSPITAL_BASED_OUTPATIENT_CLINIC_OR_DEPARTMENT_OTHER): Payer: Self-pay | Admitting: Urology

## 2019-06-03 DIAGNOSIS — N202 Calculus of kidney with calculus of ureter: Secondary | ICD-10-CM | POA: Diagnosis not present

## 2019-06-03 DIAGNOSIS — Z791 Long term (current) use of non-steroidal anti-inflammatories (NSAID): Secondary | ICD-10-CM | POA: Insufficient documentation

## 2019-06-03 DIAGNOSIS — E876 Hypokalemia: Secondary | ICD-10-CM | POA: Diagnosis not present

## 2019-06-03 DIAGNOSIS — N200 Calculus of kidney: Secondary | ICD-10-CM

## 2019-06-03 DIAGNOSIS — Z87442 Personal history of urinary calculi: Secondary | ICD-10-CM | POA: Insufficient documentation

## 2019-06-03 HISTORY — DX: Hematuria, unspecified: R31.9

## 2019-06-03 HISTORY — PX: CYSTOSCOPY/URETEROSCOPY/HOLMIUM LASER/STENT PLACEMENT: SHX6546

## 2019-06-03 HISTORY — DX: Urgency of urination: R39.15

## 2019-06-03 HISTORY — DX: Anemia, unspecified: D64.9

## 2019-06-03 HISTORY — DX: Hypocalcemia: E83.51

## 2019-06-03 LAB — POCT PREGNANCY, URINE: Preg Test, Ur: NEGATIVE

## 2019-06-03 SURGERY — CYSTOSCOPY/URETEROSCOPY/HOLMIUM LASER/STENT PLACEMENT
Anesthesia: General | Site: Ureter | Laterality: Bilateral

## 2019-06-03 MED ORDER — OXYCODONE-ACETAMINOPHEN 5-325 MG PO TABS: 1.0000 | ORAL_TABLET | Freq: Four times a day (QID) | ORAL | 0 refills | Status: DC | PRN

## 2019-06-03 MED ORDER — FENTANYL CITRATE (PF) 100 MCG/2ML IJ SOLN
INTRAMUSCULAR | Status: AC
  Filled 2019-06-03: qty 2

## 2019-06-03 MED ORDER — SCOPOLAMINE 1 MG/3DAYS TD PT72
MEDICATED_PATCH | TRANSDERMAL | Status: DC | PRN
  Administered 2019-06-03: 1 via TRANSDERMAL

## 2019-06-03 MED ORDER — DEXAMETHASONE SODIUM PHOSPHATE 10 MG/ML IJ SOLN
INTRAMUSCULAR | Status: AC
  Filled 2019-06-03: qty 1

## 2019-06-03 MED ORDER — ACETAMINOPHEN 500 MG PO TABS
ORAL_TABLET | ORAL | Status: AC
  Filled 2019-06-03: qty 2

## 2019-06-03 MED ORDER — PROPOFOL 10 MG/ML IV BOLUS
INTRAVENOUS | Status: DC | PRN
  Administered 2019-06-03: 50 mg via INTRAVENOUS
  Administered 2019-06-03: 100 mg via INTRAVENOUS
  Administered 2019-06-03: 150 mg via INTRAVENOUS

## 2019-06-03 MED ORDER — FENTANYL CITRATE (PF) 100 MCG/2ML IJ SOLN
INTRAMUSCULAR | Status: DC | PRN
  Administered 2019-06-03: 25 ug via INTRAVENOUS
  Administered 2019-06-03: 50 ug via INTRAVENOUS
  Administered 2019-06-03: 25 ug via INTRAVENOUS

## 2019-06-03 MED ORDER — IOHEXOL 300 MG/ML  SOLN
INTRAMUSCULAR | Status: DC | PRN
  Administered 2019-06-03: 50 mL

## 2019-06-03 MED ORDER — KETOROLAC TROMETHAMINE 30 MG/ML IJ SOLN
INTRAMUSCULAR | Status: DC | PRN
  Administered 2019-06-03: 30 mg via INTRAVENOUS

## 2019-06-03 MED ORDER — OXYCODONE HCL 5 MG PO TABS
5.0000 mg | ORAL_TABLET | Freq: Once | ORAL | Status: AC | PRN
  Administered 2019-06-03: 5 mg via ORAL
  Filled 2019-06-03: qty 1

## 2019-06-03 MED ORDER — PROPOFOL 10 MG/ML IV BOLUS
INTRAVENOUS | Status: AC
  Filled 2019-06-03: qty 40

## 2019-06-03 MED ORDER — LACTATED RINGERS IV SOLN
INTRAVENOUS | Status: DC
  Filled 2019-06-03 (×2): qty 1000

## 2019-06-03 MED ORDER — LIDOCAINE 2% (20 MG/ML) 5 ML SYRINGE
INTRAMUSCULAR | Status: DC | PRN
  Administered 2019-06-03: 60 mg via INTRAVENOUS

## 2019-06-03 MED ORDER — CEFAZOLIN SODIUM-DEXTROSE 2-4 GM/100ML-% IV SOLN
INTRAVENOUS | Status: AC
  Filled 2019-06-03: qty 100

## 2019-06-03 MED ORDER — MIDAZOLAM HCL 2 MG/2ML IJ SOLN
INTRAMUSCULAR | Status: DC | PRN
  Administered 2019-06-03: 2 mg via INTRAVENOUS

## 2019-06-03 MED ORDER — ONDANSETRON HCL 4 MG/2ML IJ SOLN
4.0000 mg | Freq: Once | INTRAMUSCULAR | Status: DC | PRN
  Filled 2019-06-03: qty 2

## 2019-06-03 MED ORDER — HYDROMORPHONE HCL 1 MG/ML IJ SOLN
0.2500 mg | INTRAMUSCULAR | Status: DC | PRN
  Filled 2019-06-03: qty 0.5

## 2019-06-03 MED ORDER — LIDOCAINE 2% (20 MG/ML) 5 ML SYRINGE
INTRAMUSCULAR | Status: AC
  Filled 2019-06-03: qty 5

## 2019-06-03 MED ORDER — CEFAZOLIN SODIUM-DEXTROSE 2-4 GM/100ML-% IV SOLN
2.0000 g | Freq: Once | INTRAVENOUS | Status: AC
  Administered 2019-06-03: 09:00:00 2 g via INTRAVENOUS
  Filled 2019-06-03: qty 100

## 2019-06-03 MED ORDER — SODIUM CHLORIDE 0.9 % IV SOLN
INTRAVENOUS | Status: AC | PRN
  Administered 2019-06-03: 3000 mL

## 2019-06-03 MED ORDER — OXYCODONE HCL 5 MG PO TABS
ORAL_TABLET | ORAL | Status: AC
  Filled 2019-06-03: qty 1

## 2019-06-03 MED ORDER — DEXAMETHASONE SODIUM PHOSPHATE 10 MG/ML IJ SOLN
INTRAMUSCULAR | Status: DC | PRN
  Administered 2019-06-03: 10 mg via INTRAVENOUS

## 2019-06-03 MED ORDER — KETOROLAC TROMETHAMINE 30 MG/ML IJ SOLN
30.0000 mg | Freq: Once | INTRAMUSCULAR | Status: DC | PRN
  Filled 2019-06-03: qty 1

## 2019-06-03 MED ORDER — ACETAMINOPHEN 500 MG PO TABS
1000.0000 mg | ORAL_TABLET | Freq: Once | ORAL | Status: AC
  Administered 2019-06-03: 1000 mg via ORAL
  Filled 2019-06-03: qty 2

## 2019-06-03 MED ORDER — SCOPOLAMINE 1 MG/3DAYS TD PT72
MEDICATED_PATCH | TRANSDERMAL | Status: AC
  Filled 2019-06-03: qty 1

## 2019-06-03 MED ORDER — OXYCODONE HCL 5 MG/5ML PO SOLN
5.0000 mg | Freq: Once | ORAL | Status: AC | PRN
  Filled 2019-06-03: qty 5

## 2019-06-03 MED ORDER — KETOROLAC TROMETHAMINE 30 MG/ML IJ SOLN
INTRAMUSCULAR | Status: AC
  Filled 2019-06-03: qty 1

## 2019-06-03 MED ORDER — ONDANSETRON HCL 4 MG/2ML IJ SOLN
INTRAMUSCULAR | Status: DC | PRN
  Administered 2019-06-03: 4 mg via INTRAVENOUS

## 2019-06-03 MED ORDER — MIDAZOLAM HCL 2 MG/2ML IJ SOLN
INTRAMUSCULAR | Status: AC
  Filled 2019-06-03: qty 2

## 2019-06-03 MED ORDER — ONDANSETRON HCL 4 MG/2ML IJ SOLN
INTRAMUSCULAR | Status: AC
  Filled 2019-06-03: qty 2

## 2019-06-03 SURGICAL SUPPLY — 25 items
BAG DRAIN URO-CYSTO SKYTR STRL (DRAIN) ×3 IMPLANT
BAG DRN UROCATH (DRAIN) ×1
BASKET ZERO TIP NITINOL 2.4FR (BASKET) ×2 IMPLANT
BSKT STON RTRVL ZERO TP 2.4FR (BASKET) ×1
CATH URET 5FR 28IN CONE TIP (BALLOONS)
CATH URET 5FR 28IN OPEN ENDED (CATHETERS) IMPLANT
CATH URET 5FR 70CM CONE TIP (BALLOONS) IMPLANT
CATH URET DUAL LUMEN 6-10FR 50 (CATHETERS) ×2 IMPLANT
CLOTH BEACON ORANGE TIMEOUT ST (SAFETY) ×3 IMPLANT
FIBER LASER TRAC TIP (UROLOGICAL SUPPLIES) ×2 IMPLANT
GLOVE BIO SURGEON STRL SZ7.5 (GLOVE) ×3 IMPLANT
GLOVE BIO SURGEON STRL SZ8 (GLOVE) IMPLANT
GOWN STRL REUS W/TWL LRG LVL3 (GOWN DISPOSABLE) ×3 IMPLANT
GUIDEWIRE ANG ZIPWIRE 038X150 (WIRE) ×2 IMPLANT
GUIDEWIRE STR DUAL SENSOR (WIRE) ×3 IMPLANT
IV NS IRRIG 3000ML ARTHROMATIC (IV SOLUTION) ×6 IMPLANT
KIT TURNOVER CYSTO (KITS) ×3 IMPLANT
MANIFOLD NEPTUNE II (INSTRUMENTS) ×3 IMPLANT
NS IRRIG 500ML POUR BTL (IV SOLUTION) ×3 IMPLANT
PACK CYSTO (CUSTOM PROCEDURE TRAY) ×3 IMPLANT
SHEATH URETERAL 12FRX35CM (MISCELLANEOUS) ×2 IMPLANT
STENT URET 6FRX24 CONTOUR (STENTS) ×4 IMPLANT
TUBE CONNECTING 12'X1/4 (SUCTIONS) ×1
TUBE CONNECTING 12X1/4 (SUCTIONS) ×2 IMPLANT
TUBING UROLOGY SET (TUBING) ×3 IMPLANT

## 2019-06-03 NOTE — Transfer of Care (Signed)
Immediate Anesthesia Transfer of Care Note  Patient: Phyllis House  Procedure(s) Performed: CYSTOSCOPY/RETROGRADE/URETEROSCOPY/HOLMIUM LASER/STENT PLACEMENT (Bilateral Ureter)  Patient Location: PACU  Anesthesia Type:General  Level of Consciousness: awake, alert , oriented and patient cooperative  Airway & Oxygen Therapy: Patient Spontanous Breathing and Patient connected to nasal cannula oxygen  Post-op Assessment: Report given to RN and Post -op Vital signs reviewed and stable  Post vital signs: Reviewed and stable  Last Vitals:  Vitals Value Taken Time  BP    Temp    Pulse    Resp    SpO2      Last Pain:  Vitals:   06/03/19 0731  TempSrc: Oral  PainSc: 0-No pain      Patients Stated Pain Goal: 3 (06/03/19 0731)  Complications: No apparent anesthesia complications

## 2019-06-03 NOTE — Anesthesia Postprocedure Evaluation (Signed)
Anesthesia Post Note  Patient: Phyllis House  Procedure(s) Performed: CYSTOSCOPY/RETROGRADE/URETEROSCOPY/HOLMIUM LASER/STENT PLACEMENT (Bilateral Ureter)     Patient location during evaluation: PACU Anesthesia Type: General Level of consciousness: awake and alert Pain management: pain level controlled Vital Signs Assessment: post-procedure vital signs reviewed and stable Respiratory status: spontaneous breathing, nonlabored ventilation, respiratory function stable and patient connected to nasal cannula oxygen Cardiovascular status: blood pressure returned to baseline and stable Postop Assessment: no apparent nausea or vomiting Anesthetic complications: no    Last Vitals:  Vitals:   06/03/19 1200 06/03/19 1245  BP: 123/77 134/68  Pulse: (!) 57 67  Resp: 16 16  Temp:  36.7 C  SpO2: 99% 100%    Last Pain:  Vitals:   06/03/19 1245  TempSrc:   PainSc: 3                  Trevor Iha

## 2019-06-03 NOTE — Interval H&P Note (Signed)
History and Physical Interval Note:  06/03/2019 8:56 AM  Phyllis House  has presented today for surgery, with the diagnosis of RIGHT URETERAL CLACULUS, BILATERAL RENAL CALCULI.  The various methods of treatment have been discussed with the patient and family. After consideration of risks, benefits and other options for treatment, the patient has consented to  Procedure(s): CYSTOSCOPY/RETROGRADE/URETEROSCOPY/HOLMIUM LASER/STENT PLACEMENT (Bilateral) as a surgical intervention.  The patient's history has been reviewed, patient examined, no change in status, stable for surgery.  I have reviewed the patient's chart and labs.  On nightly abx. No dysuria but more pain and bleeding with this stent. Requests some pain pills. No fever. Questions were answered to the patient's satisfaction.     [name]

## 2019-06-03 NOTE — Discharge Instructions (Signed)
NO IBUPROFEN, MOTRIN, ALEVE, OR ADVIL UNTIL AFTER 7:00 PM TONIGHT.  Ureteral Stent Implantation, Care After This sheet gives you information about how to care for yourself after your procedure. Your health care provider may also give you more specific instructions. If you have problems or questions, contact your health care provider.  Removal of the stents: You have a right and a left stent - two stents/two strings - remove the stents Monday 06/09/2019 by pulling both strings/stents.   What can I expect after the procedure? After the procedure, it is common to have:  Nausea.  Mild pain when you urinate. You may feel this pain in your lower back or lower abdomen. The pain should stop within a few minutes after you urinate. This may last for up to 1 week.  A small amount of blood in your urine for several days. Follow these instructions at home: Medicines  Take over-the-counter and prescription medicines only as told by your health care provider.  If you were prescribed an antibiotic medicine, take it as told by your health care provider. Do not stop taking the antibiotic even if you start to feel better.  Do not drive for 24 hours if you were given a sedative during your procedure.  Ask your health care provider if the medicine prescribed to you requires you to avoid driving or using heavy machinery. Activity  Rest as told by your health care provider.  Avoid sitting for a long time without moving. Get up to take short walks every 1-2 hours. This is important to improve blood flow and breathing. Ask for help if you feel weak or unsteady.  Return to your normal activities as told by your health care provider. Ask your health care provider what activities are safe for you. General instructions   Watch for any blood in your urine. Call your health care provider if the amount of blood in your urine increases.  If you have a catheter: ? Follow instructions from your health care provider  about taking care of your catheter and collection bag. ? Do not take baths, swim, or use a hot tub until your health care provider approves. Ask your health care provider if you may take showers. You may only be allowed to take sponge baths.  Drink enough fluid to keep your urine pale yellow.  Do not use any products that contain nicotine or tobacco, such as cigarettes, e-cigarettes, and chewing tobacco. These can delay healing after surgery. If you need help quitting, ask your health care provider.  Keep all follow-up visits as told by your health care provider. This is important. Contact a health care provider if:  You have pain that gets worse or does not get better with medicine, especially pain when you urinate.  You have difficulty urinating.  You feel nauseous or you vomit repeatedly during a period of more than 2 days after the procedure. Get help right away if:  Your urine is dark red or has blood clots in it.  You are leaking urine (have incontinence).  The end of the stent comes out of your urethra.  You cannot urinate.  You have sudden, sharp, or severe pain in your abdomen or lower back.  You have a fever.  You have swelling or pain in your legs.  You have difficulty breathing. Summary  After the procedure, it is common to have mild pain when you urinate that goes away within a few minutes after you urinate. This may last for up  to 1 week.  Watch for any blood in your urine. Call your health care provider if the amount of blood in your urine increases.  Take over-the-counter and prescription medicines only as told by your health care provider.  Drink enough fluid to keep your urine pale yellow. This information is not intended to replace advice given to you by your health care provider. Make sure you discuss any questions you have with your health care provider. Document Revised: 01/22/2018 Document Reviewed: 01/23/2018 Elsevier Patient Education  2020  [employer].   Post Anesthesia Home Care Instructions  Activity: Get plenty of rest for the remainder of the day. A responsible individual must stay with you for 24 hours following the procedure.  For the next 24 hours, DO NOT: -Drive a car -Advertising copywriter -Drink alcoholic beverages -Take any medication unless instructed by your physician -Make any legal decisions or sign important papers.  Meals: Start with liquid foods such as gelatin or soup. Progress to regular foods as tolerated. Avoid greasy, spicy, heavy foods. If nausea and/or vomiting occur, drink only clear liquids until the nausea and/or vomiting subsides. Call your physician if vomiting continues.  Special Instructions/Symptoms: Your throat may feel dry or sore from the anesthesia or the breathing tube placed in your throat during surgery. If this causes discomfort, gargle with warm salt water. The discomfort should disappear within 24 hours.  If you had a scopolamine patch placed behind your ear for the management of post- operative nausea and/or vomiting:  1. The medication in the patch is effective for 72 hours, after which it should be removed.  Wrap patch in a tissue and discard in the trash. Wash hands thoroughly with soap and water. 2. You may remove the patch earlier than 72 hours if you experience unpleasant side effects which may include dry mouth, dizziness or visual disturbances. 3. Avoid touching the patch. Wash your hands with soap and water after contact with the patch.

## 2019-06-03 NOTE — Anesthesia Procedure Notes (Signed)
Procedure Name: LMA Insertion Date/Time: 06/03/2019 9:15 AM Performed by: Tyrone Nine, CRNA Pre-anesthesia Checklist: Patient identified, Emergency Drugs available, Suction available, Patient being monitored and Timeout performed Patient Re-evaluated:Patient Re-evaluated prior to induction Oxygen Delivery Method: Circle system utilized Preoxygenation: Pre-oxygenation with 100% oxygen Induction Type: IV induction Ventilation: Mask ventilation without difficulty LMA: LMA inserted LMA Size: 4.0 Number of attempts: 1 Placement Confirmation: breath sounds checked- equal and bilateral,  CO2 detector and positive ETCO2 Tube secured with: Tape Dental Injury: Teeth and Oropharynx as per pre-operative assessment

## 2019-06-03 NOTE — Op Note (Signed)
Pre-operative diagnosis: right ureteral stone, left renal stones   Postoperative diagnosis: Right ureteral stone, left renal stones  Procedure: Cystoscopy with bilateral retrograde pyelogram, bilateral ureteroscopy with laser lithotripsy, stone basket extraction, bilateral ureteral stent placement  Indication for procedure: Phyllis House is a 44 year old female who is a known stone former.  She passed a right distal stone and had fever and was stented urgently earlier this month.  She was brought today for right ureteroscopy and we noted the left kidney to be filling with stone on her most recent CT and she elected to undergo left ureteroscopy as well.  Findings: The urethra and bladder were unremarkable.  Right retrograde pyelogram-this outlined a single ureter single collecting system unit.  There was some dilation of the proximal ureter with a secondary type UPJ with some tortuosity of the proximal ureter and dilation of the collecting system.  Although the ureter drained quickly, the renal pelvis and collecting system retained contrast therefore I replaced the stent on this side.  Left retrograde pyelogram-this outlined a single ureter single collecting system unit without filling defect, stricture or dilation.  The stones were visible in the upper middle and lower pole on scout imaging.  On ureteroscopy stone was noted in the right distal ureter.  Otherwise right ureter clear.  On left ureteroscopy 2 sections of the upper pole were dusted, midpole calyx as well as 2 lower pole calyces.  Although it was a a lot of stone and fragmentation and dusting went on for about 50 minutes, there was excellent fragmentation on the left.  Largest pieces were pulled out noted to be 1 to 2 mm.   Description of procedure: After consent was obtained the patient brought to the operating room.  After adequate anesthesia she was placed lithotomy position and prepped and draped in the usual sterile fashion.  A timeout was  performed to confirm the patient and procedure.  The cystoscope was passed per urethra and I slid a sensor wire adjacent to the right ureteral stent.  The right ureteral stent was then grasped and removed through the urethral meatus without difficulty.  I then took the semirigid ureteroscope adjacent to the wire where the stone was encountered.  It was broken down with a 200 m laser fiber and the largest piece removed with a 0 tip basket.  No other stones were noted.  I passed the semirigid all the way up to the proximal ureter and no other stones were noted.  And then on the way out the ureter was noted to be normal again without stone fragment or injury.  I then removed the sensor wire on the right and took the cystoscope back in with a dual-lumen exchange catheter and shot a right retrograde pyelogram.  Attention was then turned to the left side where the dual-lumen was used to shoot a left retrograde pyelogram.  I then passed the sensor wire to the collecting system and then a Glidewire.  I then passed a medium access sheath over the sensor wire without difficulty up into the proximal ureter.  The dual channel digital ureteroscope was then deployed where significant stones were noted in the left kidney.  I started in the lower pole in the long stone extending from the left lower pole was broken down and fragmented at 1.2 and 20 which ended up working well.  A large piece broke off and I was able to sweep this into the upper pole.  The remainder of the lower pole calyces were dusted  and cleared.  A midpole calyx was dusted and cleared.  The stone from the lower pole was then dusted and cleared in the upper pole infundibulum.  A large upper pole calyx was then cleared with dusting.  Another upper pole calyx was cleared with dusting. Another mid pole calyx was clearly dusting.  I then took some fragments from the lower pole in the upper pole and these were all small at 1 to 2 mm.  Again the collecting system was  carefully inspected no significant fragments were noted.  The collecting system and pelvis were inspected, the UPJ inspected.  All normal.  The access sheath was backed out on the ureteroscope and the ureter was inspected on the way out noted to be normal without stone fragment or injury.  The wire was backloaded on the cystoscope and a 624 cm stent advanced.  The wire was removed with a good coil seen in the kidney and a good coil in the bladder.  There was retained contrast in the right collecting system and therefore I repassed the sensor wire proximally but it coiled in the right proximal ureter.  I passed the dual-lumen to brace it was just able to access the curve in the proximal ureter but the sensor wire would not quite straighten the ureter.  Therefore the Glidewire was advanced which popped the ureter straight and into the collecting system.  I advanced the dual-lumen into the collecting system and shot some more contrast which confirmed placement in the collecting system.  I then left the sensor wire and backed everything out.  The sensor wire was backloaded on the cystoscope and a 6 x 24 cm stent was advanced on the right and the wire removed.  Again a good coil was seen in the kidney and the bladder.  I left strings on both stents and they were tucked into the vagina.  She was then awakened and taken recovery room in stable condition.  Complications: None  Blood loss: Minimal  Specimens: Stone fragments to office lab  Disposition: Patient stable to PACU-I called her brother and went over the procedure and postop instructions.  Discussed with the patient preoperatively and her brother postoperatively importance with follow-up at [facility] so that we can get on top of her metabolic abnormalities.

## 2019-06-04 NOTE — Progress Notes (Signed)
Mailbox was full

## 2019-06-24 DIAGNOSIS — N2589 Other disorders resulting from impaired renal tubular function: Secondary | ICD-10-CM | POA: Insufficient documentation

## 2019-08-05 ENCOUNTER — Other Ambulatory Visit: Payer: Self-pay | Admitting: Urology

## 2019-08-06 ENCOUNTER — Other Ambulatory Visit (HOSPITAL_COMMUNITY)
Admission: RE | Admit: 2019-08-06 | Discharge: 2019-08-06 | Disposition: A | Discharge status: Home / Self Care | Payer: BC Managed Care – PPO | Source: Ambulatory Visit | Attending: Urology | Admitting: Urology

## 2019-08-06 DIAGNOSIS — Z20822 Contact with and (suspected) exposure to covid-19: Secondary | ICD-10-CM | POA: Insufficient documentation

## 2019-08-06 DIAGNOSIS — Z01812 Encounter for preprocedural laboratory examination: Secondary | ICD-10-CM | POA: Diagnosis not present

## 2019-08-06 LAB — SARS CORONAVIRUS 2 (TAT 6-24 HRS): SARS Coronavirus 2: NEGATIVE

## 2019-08-06 NOTE — Progress Notes (Signed)
Attempted to make contact with pt. Mail box full

## 2019-08-06 NOTE — Progress Notes (Signed)
Patient to arrive at 0915 tomorrow morning. History and medication list reviewed. Has not taken Ibuprofen for several days. NPO after midnight. No medications in am. Pre-op instructions given.

## 2019-08-07 ENCOUNTER — Ambulatory Visit (HOSPITAL_BASED_OUTPATIENT_CLINIC_OR_DEPARTMENT_OTHER)
Admission: RE | Admit: 2019-08-07 | Discharge: 2019-08-07 | Disposition: A | Discharge status: Home / Self Care | Payer: BC Managed Care – PPO | Attending: Urology | Admitting: Urology

## 2019-08-07 ENCOUNTER — Ambulatory Visit (HOSPITAL_COMMUNITY): Payer: BC Managed Care – PPO

## 2019-08-07 ENCOUNTER — Encounter (HOSPITAL_BASED_OUTPATIENT_CLINIC_OR_DEPARTMENT_OTHER): Payer: Self-pay | Admitting: Urology

## 2019-08-07 ENCOUNTER — Encounter (HOSPITAL_BASED_OUTPATIENT_CLINIC_OR_DEPARTMENT_OTHER)
Admission: RE | Disposition: A | Discharge status: Home / Self Care | Payer: Self-pay | Source: Home / Self Care | Attending: Urology

## 2019-08-07 ENCOUNTER — Other Ambulatory Visit: Payer: Self-pay

## 2019-08-07 DIAGNOSIS — N202 Calculus of kidney with calculus of ureter: Secondary | ICD-10-CM | POA: Diagnosis not present

## 2019-08-07 DIAGNOSIS — Z87442 Personal history of urinary calculi: Secondary | ICD-10-CM | POA: Diagnosis not present

## 2019-08-07 DIAGNOSIS — N201 Calculus of ureter: Secondary | ICD-10-CM

## 2019-08-07 DIAGNOSIS — Z87440 Personal history of urinary (tract) infections: Secondary | ICD-10-CM | POA: Insufficient documentation

## 2019-08-07 HISTORY — PX: EXTRACORPOREAL SHOCK WAVE LITHOTRIPSY: SHX1557

## 2019-08-07 LAB — POCT PREGNANCY, URINE: Preg Test, Ur: NEGATIVE

## 2019-08-07 SURGERY — LITHOTRIPSY, ESWL
Anesthesia: LOCAL | Laterality: Left

## 2019-08-07 MED ORDER — DIAZEPAM 5 MG PO TABS
ORAL_TABLET | ORAL | Status: AC
  Filled 2019-08-07: qty 2

## 2019-08-07 MED ORDER — DIAZEPAM 5 MG PO TABS
10.0000 mg | ORAL_TABLET | ORAL | Status: AC
  Administered 2019-08-07: 10 mg via ORAL
  Filled 2019-08-07: qty 2

## 2019-08-07 MED ORDER — SODIUM CHLORIDE 0.9 % IV SOLN
INTRAVENOUS | Status: DC
  Filled 2019-08-07: qty 1000

## 2019-08-07 MED ORDER — CIPROFLOXACIN HCL 500 MG PO TABS
500.0000 mg | ORAL_TABLET | ORAL | Status: AC
  Administered 2019-08-07: 500 mg via ORAL
  Filled 2019-08-07: qty 1

## 2019-08-07 MED ORDER — CIPROFLOXACIN HCL 500 MG PO TABS
ORAL_TABLET | ORAL | Status: AC
  Filled 2019-08-07: qty 1

## 2019-08-07 MED ORDER — DIPHENHYDRAMINE HCL 25 MG PO CAPS
25.0000 mg | ORAL_CAPSULE | ORAL | Status: AC
  Administered 2019-08-07: 25 mg via ORAL
  Filled 2019-08-07: qty 1

## 2019-08-07 MED ORDER — DIPHENHYDRAMINE HCL 25 MG PO CAPS
ORAL_CAPSULE | ORAL | Status: AC
  Filled 2019-08-07: qty 1

## 2019-08-07 MED ORDER — SCOPOLAMINE 1 MG/3DAYS TD PT72
MEDICATED_PATCH | TRANSDERMAL | Status: AC
  Filled 2019-08-07: qty 1

## 2019-08-07 MED ORDER — SCOPOLAMINE 1 MG/3DAYS TD PT72
1.0000 | MEDICATED_PATCH | TRANSDERMAL | Status: DC
  Administered 2019-08-07: 1.5 mg via TRANSDERMAL
  Filled 2019-08-07: qty 1

## 2019-08-07 NOTE — Op Note (Signed)
See [facility] OP note scanned into chart. Also because of the size, density, location and other factors that cannot be anticipated I feel this will likely be a staged procedure. This fact supersedes any indication in the scanned [facility] operative note to the contrary.  

## 2019-08-07 NOTE — H&P (Signed)
CC: I have kidney stones.  HPI: Analyah House is a 44 year-old female established patient who is here for renal calculi.  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 Prior serum evaluation with PTH, calcium, uric acid and electrolytes was normal. Tried: HCTZ 25mg - induced symptomatic hypokalemia despite being on Kcitrate. Also, Kcit 20 meq BID caused GI upset.   Right URS Apr 2018 with Dr. Karsten Ro. Repeat labs were done which included normal serum labs. 24-hour urine revealed very low volume, extreme hypocitraturia. Her urine Ca was normal. Urine pH 6.6 - 7.2.   She underwent bilateral ureteroscopy in 01/2018, with the right side being a staged procedure due to 2 large distal right ureteral stones, which were impacted. She did not follow up, as planned, after that procedure.   03/2019: She returned with r>L pain and CT revealed several right distal stone and a small left distal stone. She was taken for bilateral URS 09/19/2018. She needed another right URS Oct 2020. Limited right [place] today   05/08/19: Patient with significant past stone history. Most recent ureteroscopy was performed in October 2020. She was referred to [facility] for further evaluation and she did have Telehealth visit with Dr. Aleene Davidson. She presents today with complaints of right lower quadrant pain, right flank pain and lower urinary tract symptoms which began last week. She initially noted increased urinary frequency, urgency, and dysuria. She is now experiencing right flank and right lower quadrant pain, as well. She has been febrile for the past 2 days with highest measurable fever of 101. Her temperature last night per her report was 100.5. She is afebrile in clinic currently, but has been using both Tylenol and NSAIDs for pain management. She complains of nausea, but denies vomiting. She complains of general malaise and  lethargy today, as well.   08/05/19: Patient with above-noted history. She underwent urgent right ureteral stent placement on 01/07 and definitive stone management on 02/04 with bilateral URS. She presents today with acute onset left flank pain, which began shortly prior to today's visit. She presented with severe pain with associated nausea and vomiting. She states that her pain is more in her left lower quadrant. She describes this as cramping in nature. She denies gross hematuria or exacerbation of lower urinary tract symptoms. No complaints of dysuria. No complaints of fevers or chills. She has been seen for further evaluation of her urolithiasis at [facility]. She tells me today that she has been placed back on potassium citrate however, has had difficulties tolerating this as she has in the past. She is currently using this once daily, as she cannot tolerate b.i.d. dosing. She is also trying to increase her intake of Crystal Light lemonade.   The problem is on both sides. She first stated noticing pain on approximately 12/31/2006. This is not her first kidney stone. She is currently having flank pain, groin pain, and nausea. She denies having back pain, vomiting, fever, and chills. She has caught a stone in her urine strainer since her symptoms began.   She has had ureteral stent and ureteroscopy for treatment of her stones in the past.     ALLERGIES: No Allergies    MEDICATIONS: Advil  Aleve  Ibuprofen  Litholite  Tylenol     GU PSH: Cysto Remove Stent FB Sim - 09/25/2018 Cysto Uretero Lithotripsy, Right - 10/22/2018, 2014 Cystoscopy And Treatment, Right - 02/22/2018, 2008 Cystoscopy Insert Stent, Right -  05/08/2019, Right - 02/22/2018, Left - 02/08/2018, Right - 2018, 2014, 2014 Ureteroscopic laser litho, Bilateral - 06/03/2019, Right - 02/11/2019, Right - 10/22/2018, Bilateral - 09/19/2018, Left - 02/22/2018, Right - 02/08/2018, Right - 2018 Ureteroscopic stone removal, Right - 2018       PSH  Notes: Cystoscopy With Ureteroscopy With Lithotripsy, Cystoscopy With Insertion Of Ureteral Stent Left, Cystoscopy With Insertion Of Ureteral Stent Left, Cystoscopy With Manipulation Of Ureteral Calculus   NON-GU PSH: None   GU PMH: Renal calculus - 05/08/2019, - 03/04/2019, - 12/03/2018, - 09/30/2018, - 09/27/2018, - 09/25/2018, - 09/11/2018, - 2018, Nephrolithiasis, - 2015 Flank Pain - 02/03/2019, - 01/17/2019, - 12/24/2018, - 09/11/2018 Acute Cystitis/UTI - 09/27/2018 Gross hematuria - 09/11/2018 LLQ pain - 09/11/2018 RLQ pain - 09/11/2018 Mixed incontinence, Mixed stress and urge urinary incontinence - 2015 Urinary Tract Inf, Unspec site, Urinary tract infection - 2015, Pyuria, - 2015 Hydronephrosis Unspec, Hydronephrosis On The Right - 2014 Ureteral calculus, Ureteral Stone - 2014    NON-GU PMH: Bacteriuria - 2018 Encounter for general adult medical examination without abnormal findings, Encounter for preventive health examination - 2015 Hypokalemia, Hypokalemia - 2015 Hypercalciuria, Hypercalciuria - 2014 Personal history of other diseases of the nervous system and sense organs, History of migraine headaches - 2014 Personal history of other specified conditions, History of nausea - 2014    FAMILY HISTORY: Family Health Status Number - Runs In Family Father Deceased At Age86 ___ - Runs In Family nephrolithiasis - Uncle, Mother, Grandfather, Aunt   SOCIAL HISTORY: Marital Status: Single Preferred Language: English; Ethnicity: Not Hispanic Or Latino; Race: White Current Smoking Status: Patient has never smoked.   Tobacco Use Assessment Completed: Used Tobacco in last 30 days? Has never drank.  Drinks 3 caffeinated drinks per day. 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     Notes: Never A Smoker, Drug Use, Caffeine Use, Marital History - Single, Alcohol Use   REVIEW OF SYSTEMS:    GU Review Female:   Patient denies frequent urination, hard to postpone urination, burning /pain with  urination, get up at night to urinate, leakage of urine, stream starts and stops, trouble starting your stream, have to strain to urinate, and being pregnant.  Gastrointestinal (Upper):   Patient reports nausea and vomiting. Patient denies indigestion/ heartburn.  Gastrointestinal (Lower):   Patient denies diarrhea and constipation.  Constitutional:   Patient denies fever, night sweats, weight loss, and fatigue.  Skin:   Patient denies skin rash/ lesion and itching.  Eyes:   Patient denies blurred vision and double vision.  Ears/ Nose/ Throat:   Patient denies sore throat and sinus problems.  Hematologic/Lymphatic:   Patient denies swollen glands and easy bruising.  Cardiovascular:   Patient denies leg swelling and chest pains.  Respiratory:   Patient denies cough and shortness of breath.  Endocrine:   Patient denies excessive thirst.  Musculoskeletal:   Patient denies back pain and joint pain.  Neurological:   Patient denies headaches and dizziness.  Psychologic:   Patient denies depression and anxiety.   Notes: Lower abdominal pain     VITAL SIGNS:      08/05/2019 01:54 PM  Weight 175 lb / 79.38 kg  Height 62 in / 157.48 cm  BP 146/91 mmHg  Pulse 79 /min  Temperature 99.3 F / 37.3 C  BMI 32.0 kg/m  Notes: Oral temperature obtained and noted to be 97.8   MULTI-SYSTEM PHYSICAL EXAMINATION:    Constitutional: Well-nourished. No physical deformities. Normally developed. Good  grooming. On intial exam, patient was pacing around the room and doubled over the chair due to pain. Much more comfortable on exam following injections.   Respiratory: No labored breathing, no use of accessory muscles.   Cardiovascular: Normal temperature, normal extremity pulses, no swelling, no varicosities.  Neurologic / Psychiatric: Oriented to time, oriented to place, oriented to person. No depression, no anxiety, no agitation.  Musculoskeletal: Normal gait and station of head and neck.     PAST DATA  REVIEWED:  Source Of History:  Patient  Records Review:   Previous Patient Records  Urine Test Review:   Urinalysis, Urine Culture  X-Ray Review: KUB: Reviewed Films.  C.T. Abdomen/Pelvis: Reviewed Films. Reviewed Report.     PROCEDURES:         KUB - [id number]  A single view of the abdomen is obtained. Likely small opacities noted within the confines of bilateral renal shadows. No obvious opacities noted along the expected anatomical course of the right ureter. Along the anatomical course of the left ureter, there is a new opacity noted just superior to the transverse process of L5. This measures approximately 6 x 7 mm and is concerning for calculus. Prominent, but normal appearance of overlying bowel gas. IUD noted.       . Patient confirmed No Neulasta OnPro Device.           Urinalysis w/Scope Dipstick Dipstick Cont'd Micro  Color: Yellow Bilirubin: Neg mg/dL WBC/hpf: 6 - 43/XVQ  Appearance: Cloudy Ketones: Neg mg/dL RBC/hpf: >00/QQP  Specific Gravity: 1.020 Blood: 3+ ery/uL Bacteria: Few (10-25/hpf)  pH: 6.5 Protein: 1+ mg/dL Cystals: NS (Not Seen)  Glucose: Neg mg/dL Urobilinogen: 0.2 mg/dL Casts: NS (Not Seen)    Nitrites: Neg Trichomonas: Not Present    Leukocyte Esterase: 1+ leu/uL Mucous: Not Present      Epithelial Cells: 0 - 5/hpf      Yeast: NS (Not Seen)      Sperm: Not Present         Morphine 4mg  - , 61950 The left glute was sterilely prepped with alcohol. Morphine was injected IM using standard technique. The patient tolerated the procedure well. A band aid was applied. The site was dry when the patient left the exam room.   Qty: 4 Adm. By: [id number]  Unit: mg Lot No Nell Range  Route: IM Exp. Date 10/28/2020  Freq: None Mfgr.:   Site: Left Buttock         Phenergan 25mg  - 10/30/2020, J2550 The injection site was sterilely prepped with alcohol. Phenergan was injected (IM) using standard technique. The patient tolerated the procedure well. A band aid was applied.  The site was dry when the patient left the exam room.   After treatment was administered, patient was observed to be symptom-free for 10-15 minutes.  EMS   Qty: 25 Adm. By:  Unit: mg Lot No [id number]  Route: IM Exp. Date 06/01/2020  Freq: None Mfgr.:   Site: Left Hip   ASSESSMENT:      ICD-10 Details  1 GU:   Renal calculus - N20.0    PLAN:            Medications New Meds: Percocet 5 mg-325 mg tablet 1-2 tablet PO Q 6 H PRN   #20  0 Refill(s)  Tamsulosin Hcl 0.4 mg capsule 1 capsule PO Daily   #30  0 Refill(s)  Promethazine Hcl 12.5 mg tablet 1 tablet PO Q 8 H PRN   #20  0 Refill(s)  Zofran 4 mg tablet 1 tablet PO Q 8 H PRN   #20  0 Refill(s)            Orders Labs Urine Culture  X-Rays: KUB  X-Ray Notes: History:  Hematuria: Yes/No  Patient to see MD after exam: Yes/No  Previous exam: CT / IVP/ US/ KUB/ None  When:  Where:  Diabetic: Yes/ No  BUN/ Creatinine:  Date of last BUN Creatinine:  Weight in pounds:  Allergy- IV Contrast: Yes/ No  Conflicting diabetic meds: Yes/ No  Diabetic Meds:  Prior Authorization #:            Schedule Procedure: 08/05/2019 at [facility] - [phone] - Phenergan 25mg  (Phenergan Per 50 Mg) - J2550, [id number]  Procedure: 08/05/2019 at [facility] - [phone] - Morphine 4mg  (Ther/Proph/Diag Inj, [place]/Im) [id number],          Document Letter(s):  Created for Patient: Clinical Summary         Notes:   Precautionary culture sent today. On KUB imaging, there is an approximately 6 x 7 mm opacity noted along the expected anatomical course of the left proximal to mid ureter. This is concerning for ureteral calculus, as it is new when compared to prior KUB imaging studies. She was given injections of Phenergan and morphine in clinic with noted relief of nausea and pain. She did have a driver today. We reviewed treatment options in detail and she would like to consider moving  forward with intervention. Case discussed with her urologist and imaging reviewed in clinic and will plan to get her set up for treatment with ESWL later this week. I reviewed this procedure and potential risk in detail with her today. She voiced understanding. Above-noted prescriptions were sent to her pharmacy and we reviewed indications, usage, and potential side effects in detail today. We discussed use of narcotic pain medications only for severe episodes of pain. Strict return precautions to the clinic or emergency department were given in the interim for fever or refractory pain, nausea, or vomiting. She voiced understanding.

## 2019-08-07 NOTE — Discharge Instructions (Signed)
Post Anesthesia Home Care Instructions  Activity: Get plenty of rest for the remainder of the day. A responsible adult should stay with you for 24 hours following the procedure.  For the next 24 hours, DO NOT: -Drive a car -Operate machinery -Drink alcoholic beverages -Take any medication unless instructed by your physician -Make any legal decisions or sign important papers.  Meals: Start with liquid foods such as gelatin or soup. Progress to regular foods as tolerated. Avoid greasy, spicy, heavy foods. If nausea and/or vomiting occur, drink only clear liquids until the nausea and/or vomiting subsides. Call your physician if vomiting continues.  Special Instructions/Symptoms: Your throat may feel dry or sore from the anesthesia or the breathing tube placed in your throat during surgery. If this causes discomfort, gargle with warm salt water. The discomfort should disappear within 24 hours.  If you had a scopolamine patch placed behind your ear for the management of post- operative nausea and/or vomiting:  1. The medication in the patch is effective for 72 hours, after which it should be removed.  Wrap patch in a tissue and discard in the trash. Wash hands thoroughly with soap and water. 2. You may remove the patch earlier than 72 hours if you experience unpleasant side effects which may include dry mouth, dizziness or visual disturbances. 3. Avoid touching the patch. Wash your hands with soap and water after contact with the patch.    Lithotripsy, Care After This sheet gives you information about how to care for yourself after your procedure. Your health care provider may also give you more specific instructions. If you have problems or questions, contact your health care provider. What can I expect after the procedure? After the procedure, it is common to have:  Some blood in your urine. This should only last for a few days.  Soreness in your back, sides, or upper abdomen for a few  days.  Blotches or bruises on your back where the pressure wave entered the skin.  Pain, discomfort, or nausea when pieces (fragments) of the kidney stone move through the tube that carries urine from the kidney to the bladder (ureter). Stone fragments may pass soon after the procedure, but they may continue to pass for up to 4-8 weeks. ? If you have severe pain or nausea, contact your health care provider. This may be caused by a large stone that was not broken up, and this may mean that you need more treatment.  Some pain or discomfort during urination.  Some pain or discomfort in the lower abdomen or (in men) at the base of the penis. Follow these instructions at home: Medicines  Take over-the-counter and prescription medicines only as told by your health care provider.  If you were prescribed an antibiotic medicine, take it as told by your health care provider. Do not stop taking the antibiotic even if you start to feel better.  Do not drive for 24 hours if you were given a medicine to help you relax (sedative).  Do not drive or use heavy machinery while taking prescription pain medicine. Eating and drinking      Drink enough water and fluids to keep your urine clear or pale yellow. This helps any remaining pieces of the stone to pass. It can also help prevent new stones from forming.  Eat plenty of fresh fruits and vegetables.  Follow instructions from your health care provider about eating and drinking restrictions. You may be instructed: ? To reduce how much salt (sodium) you   eat or drink. Check ingredients and nutrition facts on packaged foods and beverages. ? To reduce how much meat you eat.  Eat the recommended amount of calcium for your age and gender. Ask your health care provider how much calcium you should have. General instructions  Get plenty of rest.  Most people can resume normal activities 1-2 days after the procedure. Ask your health care provider what  activities are safe for you.  Your health care provider may direct you to lie in a certain position (postural drainage) and tap firmly (percuss) over your kidney area to help stone fragments pass. Follow instructions as told by your health care provider.  If directed, strain all urine through the strainer that was provided by your health care provider. ? Keep all fragments for your health care provider to see. Any stones that are found may be sent to a medical lab for examination. The stone may be as small as a grain of salt.  Keep all follow-up visits as told by your health care provider. This is important. Contact a health care provider if:  You have pain that is severe or does not get better with medicine.  You have nausea that is severe or does not go away.  You have blood in your urine longer than your health care provider told you to expect.  You have more blood in your urine.  You have pain during urination that does not go away.  You urinate more frequently than usual and this does not go away.  You develop a rash or any other possible signs of an allergic reaction. Get help right away if:  You have severe pain in your back, sides, or upper abdomen.  You have severe pain while urinating.  Your urine is very dark red.  You have blood in your stool (feces).  You cannot pass any urine at all.  You feel a strong urge to urinate after emptying your bladder.  You have a fever or chills.  You develop shortness of breath, difficulty breathing, or chest pain.  You have severe nausea that leads to persistent vomiting.  You faint. Summary  After this procedure, it is common to have some pain, discomfort, or nausea when pieces (fragments) of the kidney stone move through the tube that carries urine from the kidney to the bladder (ureter). If this pain or nausea is severe, however, you should contact your health care provider.  Most people can resume normal activities 1-2  days after the procedure. Ask your health care provider what activities are safe for you.  Drink enough water and fluids to keep your urine clear or pale yellow. This helps any remaining pieces of the stone to pass, and it can help prevent new stones from forming.  If directed, strain your urine and keep all fragments for your health care provider to see. Fragments or stones may be as small as a grain of salt.  Get help right away if you have severe pain in your back, sides, or upper abdomen or have severe pain while urinating. This information is not intended to replace advice given to you by your health care provider. Make sure you discuss any questions you have with your health care provider. Document Revised: 07/29/2018 Document Reviewed: 03/08/2016 Elsevier Patient Education  2020 [employer] See [facility] discharge instructions in chart. 

## 2019-12-12 ENCOUNTER — Other Ambulatory Visit: Payer: Self-pay | Admitting: Urology

## 2019-12-12 ENCOUNTER — Observation Stay (HOSPITAL_COMMUNITY)
Admission: AD | Admit: 2019-12-12 | Discharge: 2019-12-13 | Disposition: A | Discharge status: Home / Self Care | Payer: BC Managed Care – PPO | Source: Ambulatory Visit | Attending: Urology | Admitting: Urology

## 2019-12-12 ENCOUNTER — Observation Stay (HOSPITAL_COMMUNITY): Payer: BC Managed Care – PPO

## 2019-12-12 ENCOUNTER — Encounter (HOSPITAL_COMMUNITY): Payer: Self-pay | Admitting: Urology

## 2019-12-12 ENCOUNTER — Other Ambulatory Visit: Payer: Self-pay

## 2019-12-12 ENCOUNTER — Inpatient Hospital Stay (HOSPITAL_COMMUNITY): Payer: BC Managed Care – PPO | Admitting: Anesthesiology

## 2019-12-12 ENCOUNTER — Encounter (HOSPITAL_COMMUNITY)
Admission: AD | Disposition: A | Discharge status: Home / Self Care | Payer: Self-pay | Source: Ambulatory Visit | Attending: Urology

## 2019-12-12 DIAGNOSIS — Z20822 Contact with and (suspected) exposure to covid-19: Secondary | ICD-10-CM | POA: Insufficient documentation

## 2019-12-12 DIAGNOSIS — N201 Calculus of ureter: Secondary | ICD-10-CM | POA: Diagnosis not present

## 2019-12-12 DIAGNOSIS — N390 Urinary tract infection, site not specified: Secondary | ICD-10-CM | POA: Insufficient documentation

## 2019-12-12 HISTORY — PX: CYSTOSCOPY W/ RETROGRADES: SHX1426

## 2019-12-12 LAB — PREGNANCY, URINE: Preg Test, Ur: NEGATIVE

## 2019-12-12 LAB — SARS CORONAVIRUS 2 BY RT PCR (HOSPITAL ORDER, PERFORMED IN ~~LOC~~ HOSPITAL LAB): SARS Coronavirus 2: NEGATIVE

## 2019-12-12 SURGERY — CYSTOSCOPY, WITH RETROGRADE PYELOGRAM
Anesthesia: General | Laterality: Right

## 2019-12-12 MED ORDER — ACETAMINOPHEN 500 MG PO TABS
1000.0000 mg | ORAL_TABLET | Freq: Once | ORAL | Status: AC
  Administered 2019-12-12: 1000 mg via ORAL
  Filled 2019-12-12: qty 2

## 2019-12-12 MED ORDER — OXYCODONE-ACETAMINOPHEN 5-325 MG PO TABS: 1.0000 | ORAL_TABLET | ORAL | Status: DC | PRN

## 2019-12-12 MED ORDER — CEFDINIR 300 MG PO CAPS
300.0000 mg | ORAL_CAPSULE | Freq: Two times a day (BID) | ORAL | 0 refills | Status: DC
Start: 2019-12-12 — End: 2020-04-08

## 2019-12-12 MED ORDER — PROPOFOL 10 MG/ML IV BOLUS
INTRAVENOUS | Status: AC
  Filled 2019-12-12: qty 20

## 2019-12-12 MED ORDER — LIDOCAINE 2% (20 MG/ML) 5 ML SYRINGE
INTRAMUSCULAR | Status: DC | PRN
  Administered 2019-12-12: 80 mg via INTRAVENOUS

## 2019-12-12 MED ORDER — SCOPOLAMINE 1 MG/3DAYS TD PT72
1.0000 | MEDICATED_PATCH | Freq: Once | TRANSDERMAL | Status: DC
  Administered 2019-12-12: 1.5 mg via TRANSDERMAL
  Filled 2019-12-12: qty 1

## 2019-12-12 MED ORDER — ZOLPIDEM TARTRATE 5 MG PO TABS: 5.0000 mg | ORAL_TABLET | Freq: Every evening | ORAL | Status: DC | PRN

## 2019-12-12 MED ORDER — ONDANSETRON HCL 4 MG/2ML IJ SOLN
INTRAMUSCULAR | Status: DC | PRN
  Administered 2019-12-12: 4 mg via INTRAVENOUS

## 2019-12-12 MED ORDER — FENTANYL CITRATE (PF) 100 MCG/2ML IJ SOLN
INTRAMUSCULAR | Status: AC
  Filled 2019-12-12: qty 2

## 2019-12-12 MED ORDER — LACTATED RINGERS IV SOLN: INTRAVENOUS | Status: DC

## 2019-12-12 MED ORDER — PROMETHAZINE HCL 25 MG/ML IJ SOLN: 6.2500 mg | INTRAMUSCULAR | Status: DC | PRN

## 2019-12-12 MED ORDER — DEXAMETHASONE SODIUM PHOSPHATE 10 MG/ML IJ SOLN
INTRAMUSCULAR | Status: DC | PRN
  Administered 2019-12-12: 8 mg via INTRAVENOUS

## 2019-12-12 MED ORDER — ACETAMINOPHEN 325 MG PO TABS: 650.0000 mg | ORAL_TABLET | ORAL | Status: DC | PRN

## 2019-12-12 MED ORDER — SODIUM CHLORIDE 0.9 % IR SOLN
Status: DC | PRN
  Administered 2019-12-12: 1000 mL

## 2019-12-12 MED ORDER — DIPHENHYDRAMINE HCL 50 MG/ML IJ SOLN: 12.5000 mg | Freq: Four times a day (QID) | INTRAMUSCULAR | Status: DC | PRN

## 2019-12-12 MED ORDER — ONDANSETRON HCL 4 MG/2ML IJ SOLN: 4.0000 mg | INTRAMUSCULAR | Status: DC | PRN

## 2019-12-12 MED ORDER — SODIUM CHLORIDE 0.9 % IV SOLN: INTRAVENOUS | Status: DC

## 2019-12-12 MED ORDER — SODIUM CHLORIDE 0.9 % IV SOLN
1.0000 g | INTRAVENOUS | Status: DC
  Administered 2019-12-13: 1 g via INTRAVENOUS
  Filled 2019-12-12: qty 1

## 2019-12-12 MED ORDER — STERILE WATER FOR IRRIGATION IR SOLN
Status: DC | PRN
  Administered 2019-12-12: 3000 mL

## 2019-12-12 MED ORDER — DIPHENHYDRAMINE HCL 12.5 MG/5ML PO ELIX: 12.5000 mg | ORAL_SOLUTION | Freq: Four times a day (QID) | ORAL | Status: DC | PRN

## 2019-12-12 MED ORDER — PROPOFOL 10 MG/ML IV BOLUS
INTRAVENOUS | Status: DC | PRN
  Administered 2019-12-12: 180 mg via INTRAVENOUS

## 2019-12-12 MED ORDER — FENTANYL CITRATE (PF) 100 MCG/2ML IJ SOLN: 25.0000 ug | INTRAMUSCULAR | Status: DC | PRN

## 2019-12-12 MED ORDER — DOCUSATE SODIUM 100 MG PO CAPS
100.0000 mg | ORAL_CAPSULE | Freq: Two times a day (BID) | ORAL | Status: DC
  Administered 2019-12-12 – 2019-12-13 (×2): 100 mg via ORAL
  Filled 2019-12-12 (×2): qty 1

## 2019-12-12 MED ORDER — KETOROLAC TROMETHAMINE 15 MG/ML IJ SOLN
15.0000 mg | Freq: Four times a day (QID) | INTRAMUSCULAR | Status: DC | PRN
  Administered 2019-12-12 – 2019-12-13 (×2): 15 mg via INTRAVENOUS
  Filled 2019-12-12 (×2): qty 1

## 2019-12-12 MED ORDER — MIDAZOLAM HCL 2 MG/2ML IJ SOLN
INTRAMUSCULAR | Status: AC
  Filled 2019-12-12: qty 2

## 2019-12-12 MED ORDER — FENTANYL CITRATE (PF) 100 MCG/2ML IJ SOLN
INTRAMUSCULAR | Status: DC | PRN
  Administered 2019-12-12: 25 ug via INTRAVENOUS

## 2019-12-12 SURGICAL SUPPLY — 17 items
BAG URO CATCHER STRL LF (MISCELLANEOUS) ×3 IMPLANT
CATH INTERMIT  6FR 70CM (CATHETERS) IMPLANT
CLOTH BEACON ORANGE TIMEOUT ST (SAFETY) ×3 IMPLANT
COVER WAND RF STERILE (DRAPES) IMPLANT
GAUZE 4X4 16PLY RFD (DISPOSABLE) ×2 IMPLANT
GLOVE BIOGEL M STRL SZ7.5 (GLOVE) ×3 IMPLANT
GOWN STRL REUS W/TWL LRG LVL3 (GOWN DISPOSABLE) ×3 IMPLANT
GUIDEWIRE STR DUAL SENSOR (WIRE) ×3 IMPLANT
KIT TURNOVER KIT A (KITS) IMPLANT
MANIFOLD NEPTUNE II (INSTRUMENTS) ×3 IMPLANT
NS IRRIG 1000ML POUR BTL (IV SOLUTION) IMPLANT
PACK CYSTO (CUSTOM PROCEDURE TRAY) ×3 IMPLANT
PENCIL SMOKE EVACUATOR (MISCELLANEOUS) IMPLANT
STENT URET 6FRX24 CONTOUR (STENTS) ×2 IMPLANT
TUBING CONNECTING 10 (TUBING) ×2 IMPLANT
TUBING CONNECTING 10' (TUBING) ×1
TUBING INSUFFLATION 10FT LAP (TUBING) IMPLANT

## 2019-12-12 NOTE — Op Note (Signed)
Preoperative diagnosis:  1. Right ureteral calculi and UTI   Postoperative diagnosis:  1. Right ureteral calculi and UTI   Procedure:  1. Cystoscopy 2. Right ureteral stent placement (6 x 24 - no string)  Surgeon: Rolly Salter, Montez Hageman. M.D.  Anesthesia: General  Complications: None  EBL: Minimal  Specimens: None  Indication: Phyllis House is a 44 y.o. patient with right ureteral calculi and a UTI. After reviewing the management options for treatment, he elected to proceed with the above surgical procedure(s). We have discussed the potential benefits and risks of the procedure, side effects of the proposed treatment, the likelihood of the patient achieving the goals of the procedure, and any potential problems that might occur during the procedure or recuperation. Informed consent has been obtained.  Description of procedure:  The patient was taken to the operating room and general anesthesia was induced.  The patient was placed in the dorsal lithotomy position, prepped and draped in the usual sterile fashion, and preoperative antibiotics were administered earlier in the day at our office (ceftriaxone). A preoperative time-out was performed.   Cystourethroscopy was performed.  The patient's urethra was examined and was normal. The bladder was then systematically examined in its entirety. There was no evidence for any bladder tumors, stones, or other mucosal pathology.    A 0.38 sensor guidewire was then advanced up the right ureter into the renal pelvis under fluoroscopic guidance.  The wire was then backloaded through the cystoscope and a ureteral stent was advance over the wire using Seldinger technique.  The stent was positioned appropriately under fluoroscopic and cystoscopic guidance.  The wire was then removed with an adequate stent curl noted in the renal pelvis as well as in the bladder.  The bladder was then emptied and the procedure ended.  The patient appeared to tolerate  the procedure well and without complications.  The patient was able to be awakened and transferred to the recovery unit in satisfactory condition.    [name] MD

## 2019-12-12 NOTE — Anesthesia Preprocedure Evaluation (Addendum)
Anesthesia Evaluation  Patient identified by MRN, date of birth, ID band Patient awake    Reviewed: Allergy & Precautions, NPO status , Patient's Chart, lab work & pertinent test results  Airway Mallampati: II  TM Distance: >3 FB Neck ROM: Full    Dental  (+) Dental Advisory Given, Chipped   Pulmonary    Pulmonary exam normal breath sounds clear to auscultation       Cardiovascular Exercise Tolerance: Good negative cardio ROS Normal cardiovascular exam Rhythm:Regular Rate:Normal     Neuro/Psych  Headaches, PSYCHIATRIC DISORDERS Anxiety    GI/Hepatic negative GI ROS, Neg liver ROS,   Endo/Other  negative endocrine ROS  Renal/GU Renal disease     Musculoskeletal negative musculoskeletal ROS (+)   Abdominal   Peds  Hematology negative hematology ROS (+)   Anesthesia Other Findings Day of surgery medications reviewed with the patient.  Reproductive/Obstetrics negative OB ROS                           Anesthesia Physical Anesthesia Plan  ASA: II  Anesthesia Plan: General   Post-op Pain Management:    Induction: Intravenous  PONV Risk Score and Plan: 4 or greater and Scopolamine patch - Pre-op, Midazolam, Dexamethasone and Ondansetron  Airway Management Planned: LMA  Additional Equipment:   Intra-op Plan:   Post-operative Plan: Extubation in OR  Informed Consent: I have reviewed the patients History and Physical, chart, labs and discussed the procedure including the risks, benefits and alternatives for the proposed anesthesia with the patient or authorized representative who has indicated his/her understanding and acceptance.       Plan Discussed with: CRNA  Anesthesia Plan Comments:        Anesthesia Quick Evaluation

## 2019-12-12 NOTE — H&P (View-Only) (Signed)
Office Visit Report     12/12/2019   --------------------------------------------------------------------------------   Phyllis House  MRN: [id number]  DOB: [birth year], 44 year old Female  SSN:-[id number]   PRIMARY CARE:  MEDICAID [place] [facility] [place]  REFERRING:    PROVIDER:  [name], M.D.  TREATING:  Vianne Bulls, NP  LOCATION:  [facility] [phone]     --------------------------------------------------------------------------------   CC: I have kidney stones.  HPI: Phyllis House is a 44 year-old female established patient who is here for renal calculi.  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 Prior serum evaluation with PTH, calcium, uric acid and electrolytes was normal. Tried: HCTZ 25mg - induced symptomatic hypokalemia despite being on Kcitrate. Also, Kcit 20 meq BID caused GI upset.   Right URS Apr 2018 with Dr. May 2018. Repeat labs were done which included normal serum labs. 24-hour urine revealed very low volume, extreme hypocitraturia. Her urine Ca was normal. Urine pH 6.6 - 7.2.   She underwent bilateral ureteroscopy in 01/2018, with the right side being a staged procedure due to 2 large distal right ureteral stones, which were impacted. She did not follow up, as planned, after that procedure.   She returned with r>L pain which is somwhat chronic now. She passes some stones. Saw [facility] and they are working with dietician. Needs another 24 hr urine. Goes back in Oct. 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 Nov benign - no hydro.   Interval 11/28/19: She was seen last week for follow up and doing well. She has some non obstructive calculi on imaging and there was no hydro noted on RUS. She presents today with acute onset flank pain, which has been ongoing for the past 4 days. She describes the pain as  in her flank. It will radiate to her abdomen at times. She can have intermittent episodes of more severe pain. She has been using oral Keterolac, which has been helpful. Her last dose was early this morning. She also complains of gross hematuria with intermittent passage of clot material. She has some discomfort associated with voiding. No complaints of fevers, chills, nausea, or vomiting. No passage of stone material in the interim.   12/12/19: Patient with above-noted history. She presents today for ongoing follow-up. She states that she feels she may have passed some stone material in the interim. She states that flank pain has improved, though continues to have some intermittent mild episodes. She continues to have persistent gross hematuria. She states that this does improve with hydration, but is occurring daily. She will intermittently have passage of small clot material. She denies any fevers or chills. She does have some dysuria associated with voiding and increased voiding symptoms, which have been ongoing for the past week. She denies any nausea or vomiting currently.   The problem is on both sides. She first stated noticing pain on approximately 12/31/2006. This is not her first kidney stone. She is currently having flank pain, groin pain, and nausea. She denies having back pain, vomiting, fever, and chills. She has caught a stone in her urine strainer since her symptoms began.   She has had ureteral stent and ureteroscopy for treatment of her stones in the past.     ALLERGIES: No Allergies    MEDICATIONS: Ketorolac Tromethamine 10 mg tablet 1 tablet PO Q 6 H PRN  Percocet 5 mg-325 mg tablet 1  tablet PO Q 6 H PRN  Tamsulosin Hcl 0.4 mg capsule  Advil  Aleve  Ibuprofen  Promethazine Hcl 12.5 mg tablet 1 tablet PO Q 8 H PRN  Tylenol  Zofran 4 mg tablet 1 tablet PO Q 8 H PRN     GU PSH: Cysto Remove Stent FB Sim - 09/25/2018 Cysto Uretero Lithotripsy, Right - 10/22/2018, 2014 Cystoscopy  And Treatment, Right - 02/22/2018, 2008 Cystoscopy Insert Stent, Right - 05/08/2019, Right - 02/22/2018, Left - 02/08/2018, Right - 2018, 2014, 2014 ESWL, Left - 08/07/2019 Ureteroscopic laser litho, Bilateral - 06/03/2019, Right - 02/11/2019, Right - 10/22/2018, Bilateral - 09/19/2018, Left - 02/22/2018, Right - 02/08/2018, Right - 2018 Ureteroscopic stone removal, Right - 2018       PSH Notes: Cystoscopy With Ureteroscopy With Lithotripsy, Cystoscopy With Insertion Of Ureteral Stent Left, Cystoscopy With Insertion Of Ureteral Stent Left, Cystoscopy With Manipulation Of Ureteral Calculus   NON-GU PSH: None   GU PMH: Renal calculus, KUB and [place] look good. See in 6 mo with [place]. - 11/19/2019, - 10/30/2019, - 10/01/2019, - 08/21/2019, - 08/05/2019, - 05/08/2019, - 03/04/2019, - 12/03/2018, - 09/30/2018, - 09/27/2018, - 09/25/2018, - 09/11/2018, - 2018, Nephrolithiasis, - 2015 Flank Pain - 02/03/2019, - 01/17/2019, - 12/24/2018, - 09/11/2018 Acute Cystitis/UTI - 09/27/2018 Gross hematuria - 09/11/2018 LLQ pain - 09/11/2018 RLQ pain - 09/11/2018 Mixed incontinence, Mixed stress and urge urinary incontinence - 2015 Urinary Tract Inf, Unspec site, Urinary tract infection - 2015, Pyuria, - 2015 Hydronephrosis Unspec, Hydronephrosis On The Right - 2014 Ureteral calculus, Ureteral Stone - 2014    NON-GU PMH: Bacteriuria - 2018 Encounter for general adult medical examination without abnormal findings, Encounter for preventive health examination - 2015 Hypokalemia, Hypokalemia - 2015 Hypercalciuria, Hypercalciuria - 2014 Personal history of other diseases of the nervous system and sense organs, History of migraine headaches - 2014 Personal history of other specified conditions, History of nausea - 2014    FAMILY HISTORY: Family Health Status Number - Runs In Family Father Deceased At Age86 ___ - Runs In Family nephrolithiasis - Mother, Grandfather, Aunt, Uncle   SOCIAL HISTORY: Marital Status: Single Preferred Language:  English; Ethnicity: Not Hispanic Or Latino; Race: White Current Smoking Status: Patient has never smoked.   Tobacco Use Assessment Completed: Used Tobacco in last 30 days? Does not use smokeless tobacco. Has never drank.  Does not use drugs. Drinks 3 caffeinated drinks per day. Has not had a blood transfusion. 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     Notes: Never A Smoker, Drug Use, Caffeine Use, Marital History - Single, Alcohol Use   REVIEW OF SYSTEMS:    GU Review Female:   Patient denies frequent urination, hard to postpone urination, burning /pain with urination, get up at night to urinate, leakage of urine, stream starts and stops, trouble starting your stream, have to strain to urinate, and being pregnant.  Gastrointestinal (Upper):   Patient denies nausea, indigestion/ heartburn, and vomiting.  Gastrointestinal (Lower):   Patient denies diarrhea and constipation.  Constitutional:   Patient denies fever, night sweats, weight loss, and fatigue.  Skin:   Patient denies skin rash/ lesion and itching.  Eyes:   Patient denies blurred vision and double vision.  Ears/ Nose/ Throat:   Patient denies sore throat and sinus problems.  Hematologic/Lymphatic:   Patient denies swollen glands and easy bruising.  Cardiovascular:   Patient denies leg swelling and chest pains.  Respiratory:   Patient denies cough and shortness of breath.  Endocrine:   Patient denies excessive thirst.  Musculoskeletal:   Patient denies back pain and joint pain.  Neurological:   Patient denies headaches and dizziness.  Psychologic:   Patient denies depression and anxiety.   Notes: Gross Hematuria    VITAL SIGNS:      12/12/2019 09:10 AM  Weight 180 lb / 81.65 kg  Height 64 in / 162.56 cm  BP 126/81 mmHg  Heart Rate 69 /min  Temperature 99.1 F / 37.2 C  BMI 30.9 kg/m   MULTI-SYSTEM PHYSICAL EXAMINATION:    Constitutional: Well-nourished. No physical deformities. Normally developed. Good  grooming.  Respiratory: No labored breathing, no use of accessory muscles.   Cardiovascular: Normal temperature, normal extremity pulses, no swelling, no varicosities.  Neurologic / Psychiatric: Oriented to time, oriented to place, oriented to person. No depression, no anxiety, no agitation.  Gastrointestinal: No mass, no tenderness, no rigidity, non obese abdomen.  Musculoskeletal: Normal gait and station of head and neck.     Complexity of Data:  Records Review:   Previous Patient Records  Urine Test Review:   Urinalysis, Urine Culture  X-Ray Review: KUB: Reviewed Films.     PROCEDURES:         C.T. Urogram - [id number]      Patient confirmed No Neulasta OnPro Device.          Renal Ultrasound - 88502  Right Kidney: Length: 10.3 cm Depth:6.5 cm Cortical Width: 1.6 cm Width: 5.4 cm  Left Kidney: Length:11.3 cm Depth:5.6 cm Cortical Width: 1.9 cm Width: 5.1 cm  Left Kidney/Ureter:  ? Multiple stones, largest= 1.3cm  Right Kidney/Ureter:  Hydro noted ( unable to follow ureter due to bowel gas)  Bladder:  PVR= 54.30ML      Patient confirmed No Neulasta OnPro Device.           Urinalysis w/Scope Dipstick Dipstick Cont'd Micro  Color: Amber Bilirubin: Neg mg/dL WBC/hpf: >77/AJO  Appearance: Turbid Ketones: Neg mg/dL RBC/hpf: 20 - 87/OMV  Specific Gravity: 1.025 Blood: 3+ ery/uL Bacteria: Mod (26-50/hpf)  pH: 7.0 Protein: 3+ mg/dL Cystals: Amorph Phosphates  Glucose: Neg mg/dL Urobilinogen: 0.2 mg/dL Casts: NS (Not Seen)    Nitrites: Positive Trichomonas: Not Present    Leukocyte Esterase: 3+ leu/uL Mucous: Present      Epithelial Cells: 0 - 5/hpf      Yeast: NS (Not Seen)      Sperm: Not Present         Ceftriaxone 1g - 67209, O7096 Qty: 1 Adm. By: Carolan Clines  Unit: gram Lot No [id number]  Route: IM Exp. Date 10/29/2020  Freq: None Mfgr.:   Site: Left Buttock   ASSESSMENT:      ICD-10 Details  1 GU:   Renal calculus - N20.0   2   Gross hematuria - R31.0     PLAN:           Orders Labs Urine Culture  X-Rays: C.T. Stone Protocol Without Contrast    Renal Ultrasound          Schedule Procedure: 12/12/2019 at [facility] - [phone] - Ceftriaxone 1g (Injection, Ceftriaxone Sodium, Per 250 Mg) - Q9476, 54650          Document Letter(s):  Created for Patient: Clinical Summary         Notes:   Urinalysis today does look more concerning for infectious process. Urine culture from 2 weeks ago was noted to be negative. Repeat culture sent today. Renal ultrasound  shows significant right-sided hydronephrosis. Given concerning UA findings today, I did proceed with CT imaging. CT imaging reveals severe right hydroureteronephrosis with approximately 4-5 obstructing mid ureteral calculi noted. Nonobstructive calculi noted on the left. I do not appreciate any obvious left ureteral calculi or hydronephrosis. Radiology impression this pending. Patient is currently afebrile in clinic; however, I am concerned about findings today in clinic. Case reviewed with the on-call urologist. Will plan to proceed to the operating room for cystoscopy, right retrograde pyelogram, and right ureteral stent placement given concern for underlying infectious process. We reviewed the procedure and potential risk in detail. She is aware. She voiced understanding and would like to proceed. She was given an injection of Rocephin 1 g in clinic at approximately 12:30 p.m.. She was instructed to remain NPO present to [place] as instructed.        

## 2019-12-12 NOTE — Transfer of Care (Signed)
Immediate Anesthesia Transfer of Care Note  Patient: Phyllis House  Procedure(s) Performed: Procedure(s): CYSTOSCOPY WITH RIGHT RETROGRADE PYELOGRAM AND STENT PLACEMENT (Right)  Patient Location: PACU  Anesthesia Type:General  Level of Consciousness:  sedated, patient cooperative and responds to stimulation  Airway & Oxygen Therapy:Patient Spontanous Breathing and Patient connected to face mask oxgen  Post-op Assessment:  Report given to PACU RN and Post -op Vital signs reviewed and stable  Post vital signs:  Reviewed and stable  Last Vitals:  Vitals:   12/12/19 1513  BP: 128/77  Pulse: 63  Resp: 18  Temp: 36.8 C  SpO2: 100%    Complications: No apparent anesthesia complications

## 2019-12-12 NOTE — Anesthesia Postprocedure Evaluation (Signed)
Anesthesia Post Note  Patient: Phyllis House  Procedure(s) Performed: CYSTOSCOPY WITH RIGHT RETROGRADE PYELOGRAM AND STENT PLACEMENT (Right )     Patient location during evaluation: PACU Anesthesia Type: General Level of consciousness: awake and alert, oriented and awake Pain management: pain level controlled Vital Signs Assessment: post-procedure vital signs reviewed and stable Respiratory status: spontaneous breathing, nonlabored ventilation, respiratory function stable and patient connected to nasal cannula oxygen Cardiovascular status: blood pressure returned to baseline and stable Postop Assessment: no apparent nausea or vomiting Anesthetic complications: no   No complications documented.  Last Vitals:  Vitals:   12/12/19 1815 12/12/19 1842  BP: 123/74 129/79  Pulse: (!) 55 (!) 50  Resp: 19 15  Temp:  36.7 C  SpO2: 96% 100%    Last Pain:  Vitals:   12/12/19 1852  TempSrc:   PainSc: 0-No pain                 Cecile Hearing

## 2019-12-12 NOTE — H&P (Signed)
Office Visit Report     12/12/2019   --------------------------------------------------------------------------------   Phyllis House  MRN: [id number]  DOB: [birth year], 44 year old Female  SSN:-[id number]   PRIMARY CARE:  MEDICAID [place] [facility] [place]  REFERRING:    PROVIDER:  [name], M.D.  TREATING:  Vianne Bulls, NP  LOCATION:  [facility] [phone]     --------------------------------------------------------------------------------   CC: I have kidney stones.  HPI: Phyllis House is a 44 year-old female established patient who is here for renal calculi.  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 Prior serum evaluation with PTH, calcium, uric acid and electrolytes was normal. Tried: HCTZ 25mg - induced symptomatic hypokalemia despite being on Kcitrate. Also, Kcit 20 meq BID caused GI upset.   Right URS Apr 2018 with Dr. May 2018. Repeat labs were done which included normal serum labs. 24-hour urine revealed very low volume, extreme hypocitraturia. Her urine Ca was normal. Urine pH 6.6 - 7.2.   She underwent bilateral ureteroscopy in 01/2018, with the right side being a staged procedure due to 2 large distal right ureteral stones, which were impacted. She did not follow up, as planned, after that procedure.   She returned with r>L pain which is somwhat chronic now. She passes some stones. Saw [facility] and they are working with dietician. Needs another 24 hr urine. Goes back in Oct. 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 Nov benign - no hydro.   Interval 11/28/19: She was seen last week for follow up and doing well. She has some non obstructive calculi on imaging and there was no hydro noted on RUS. She presents today with acute onset flank pain, which has been ongoing for the past 4 days. She describes the pain as  in her flank. It will radiate to her abdomen at times. She can have intermittent episodes of more severe pain. She has been using oral Keterolac, which has been helpful. Her last dose was early this morning. She also complains of gross hematuria with intermittent passage of clot material. She has some discomfort associated with voiding. No complaints of fevers, chills, nausea, or vomiting. No passage of stone material in the interim.   12/12/19: Patient with above-noted history. She presents today for ongoing follow-up. She states that she feels she may have passed some stone material in the interim. She states that flank pain has improved, though continues to have some intermittent mild episodes. She continues to have persistent gross hematuria. She states that this does improve with hydration, but is occurring daily. She will intermittently have passage of small clot material. She denies any fevers or chills. She does have some dysuria associated with voiding and increased voiding symptoms, which have been ongoing for the past week. She denies any nausea or vomiting currently.   The problem is on both sides. She first stated noticing pain on approximately 12/31/2006. This is not her first kidney stone. She is currently having flank pain, groin pain, and nausea. She denies having back pain, vomiting, fever, and chills. She has caught a stone in her urine strainer since her symptoms began.   She has had ureteral stent and ureteroscopy for treatment of her stones in the past.     ALLERGIES: No Allergies    MEDICATIONS: Ketorolac Tromethamine 10 mg tablet 1 tablet PO Q 6 H PRN  Percocet 5 mg-325 mg tablet 1  tablet PO Q 6 H PRN  Tamsulosin Hcl 0.4 mg capsule  Advil  Aleve  Ibuprofen  Promethazine Hcl 12.5 mg tablet 1 tablet PO Q 8 H PRN  Tylenol  Zofran 4 mg tablet 1 tablet PO Q 8 H PRN     GU PSH: Cysto Remove Stent FB Sim - 09/25/2018 Cysto Uretero Lithotripsy, Right - 10/22/2018, 2014 Cystoscopy  And Treatment, Right - 02/22/2018, 2008 Cystoscopy Insert Stent, Right - 05/08/2019, Right - 02/22/2018, Left - 02/08/2018, Right - 2018, 2014, 2014 ESWL, Left - 08/07/2019 Ureteroscopic laser litho, Bilateral - 06/03/2019, Right - 02/11/2019, Right - 10/22/2018, Bilateral - 09/19/2018, Left - 02/22/2018, Right - 02/08/2018, Right - 2018 Ureteroscopic stone removal, Right - 2018       PSH Notes: Cystoscopy With Ureteroscopy With Lithotripsy, Cystoscopy With Insertion Of Ureteral Stent Left, Cystoscopy With Insertion Of Ureteral Stent Left, Cystoscopy With Manipulation Of Ureteral Calculus   NON-GU PSH: None   GU PMH: Renal calculus, KUB and [place] look good. See in 6 mo with [place]. - 11/19/2019, - 10/30/2019, - 10/01/2019, - 08/21/2019, - 08/05/2019, - 05/08/2019, - 03/04/2019, - 12/03/2018, - 09/30/2018, - 09/27/2018, - 09/25/2018, - 09/11/2018, - 2018, Nephrolithiasis, - 2015 Flank Pain - 02/03/2019, - 01/17/2019, - 12/24/2018, - 09/11/2018 Acute Cystitis/UTI - 09/27/2018 Gross hematuria - 09/11/2018 LLQ pain - 09/11/2018 RLQ pain - 09/11/2018 Mixed incontinence, Mixed stress and urge urinary incontinence - 2015 Urinary Tract Inf, Unspec site, Urinary tract infection - 2015, Pyuria, - 2015 Hydronephrosis Unspec, Hydronephrosis On The Right - 2014 Ureteral calculus, Ureteral Stone - 2014    NON-GU PMH: Bacteriuria - 2018 Encounter for general adult medical examination without abnormal findings, Encounter for preventive health examination - 2015 Hypokalemia, Hypokalemia - 2015 Hypercalciuria, Hypercalciuria - 2014 Personal history of other diseases of the nervous system and sense organs, History of migraine headaches - 2014 Personal history of other specified conditions, History of nausea - 2014    FAMILY HISTORY: Family Health Status Number - Runs In Family Father Deceased At Age86 ___ - Runs In Family nephrolithiasis - Mother, Grandfather, Aunt, Uncle   SOCIAL HISTORY: Marital Status: Single Preferred Language:  English; Ethnicity: Not Hispanic Or Latino; Race: White Current Smoking Status: Patient has never smoked.   Tobacco Use Assessment Completed: Used Tobacco in last 30 days? Does not use smokeless tobacco. Has never drank.  Does not use drugs. Drinks 3 caffeinated drinks per day. Has not had a blood transfusion. 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     Notes: Never A Smoker, Drug Use, Caffeine Use, Marital History - Single, Alcohol Use   REVIEW OF SYSTEMS:    GU Review Female:   Patient denies frequent urination, hard to postpone urination, burning /pain with urination, get up at night to urinate, leakage of urine, stream starts and stops, trouble starting your stream, have to strain to urinate, and being pregnant.  Gastrointestinal (Upper):   Patient denies nausea, indigestion/ heartburn, and vomiting.  Gastrointestinal (Lower):   Patient denies diarrhea and constipation.  Constitutional:   Patient denies fever, night sweats, weight loss, and fatigue.  Skin:   Patient denies skin rash/ lesion and itching.  Eyes:   Patient denies blurred vision and double vision.  Ears/ Nose/ Throat:   Patient denies sore throat and sinus problems.  Hematologic/Lymphatic:   Patient denies swollen glands and easy bruising.  Cardiovascular:   Patient denies leg swelling and chest pains.  Respiratory:   Patient denies cough and shortness of breath.  Endocrine:   Patient denies excessive thirst.  Musculoskeletal:   Patient denies back pain and joint pain.  Neurological:   Patient denies headaches and dizziness.  Psychologic:   Patient denies depression and anxiety.   Notes: Gross Hematuria    VITAL SIGNS:      12/12/2019 09:10 AM  Weight 180 lb / 81.65 kg  Height 64 in / 162.56 cm  BP 126/81 mmHg  Heart Rate 69 /min  Temperature 99.1 F / 37.2 C  BMI 30.9 kg/m   MULTI-SYSTEM PHYSICAL EXAMINATION:    Constitutional: Well-nourished. No physical deformities. Normally developed. Good  grooming.  Respiratory: No labored breathing, no use of accessory muscles.   Cardiovascular: Normal temperature, normal extremity pulses, no swelling, no varicosities.  Neurologic / Psychiatric: Oriented to time, oriented to place, oriented to person. No depression, no anxiety, no agitation.  Gastrointestinal: No mass, no tenderness, no rigidity, non obese abdomen.  Musculoskeletal: Normal gait and station of head and neck.     Complexity of Data:  Records Review:   Previous Patient Records  Urine Test Review:   Urinalysis, Urine Culture  X-Ray Review: KUB: Reviewed Films.     PROCEDURES:         C.T. Urogram - [id number]      Patient confirmed No Neulasta OnPro Device.          Renal Ultrasound - 88502  Right Kidney: Length: 10.3 cm Depth:6.5 cm Cortical Width: 1.6 cm Width: 5.4 cm  Left Kidney: Length:11.3 cm Depth:5.6 cm Cortical Width: 1.9 cm Width: 5.1 cm  Left Kidney/Ureter:  ? Multiple stones, largest= 1.3cm  Right Kidney/Ureter:  Hydro noted ( unable to follow ureter due to bowel gas)  Bladder:  PVR= 54.30ML      Patient confirmed No Neulasta OnPro Device.           Urinalysis w/Scope Dipstick Dipstick Cont'd Micro  Color: Amber Bilirubin: Neg mg/dL WBC/hpf: >77/AJO  Appearance: Turbid Ketones: Neg mg/dL RBC/hpf: 20 - 87/OMV  Specific Gravity: 1.025 Blood: 3+ ery/uL Bacteria: Mod (26-50/hpf)  pH: 7.0 Protein: 3+ mg/dL Cystals: Amorph Phosphates  Glucose: Neg mg/dL Urobilinogen: 0.2 mg/dL Casts: NS (Not Seen)    Nitrites: Positive Trichomonas: Not Present    Leukocyte Esterase: 3+ leu/uL Mucous: Present      Epithelial Cells: 0 - 5/hpf      Yeast: NS (Not Seen)      Sperm: Not Present         Ceftriaxone 1g - 67209, O7096 Qty: 1 Adm. By: Carolan Clines  Unit: gram Lot No [id number]  Route: IM Exp. Date 10/29/2020  Freq: None Mfgr.:   Site: Left Buttock   ASSESSMENT:      ICD-10 Details  1 GU:   Renal calculus - N20.0   2   Gross hematuria - R31.0     PLAN:           Orders Labs Urine Culture  X-Rays: C.T. Stone Protocol Without Contrast    Renal Ultrasound          Schedule Procedure: 12/12/2019 at [facility] - [phone] - Ceftriaxone 1g (Injection, Ceftriaxone Sodium, Per 250 Mg) - Q9476, 54650          Document Letter(s):  Created for Patient: Clinical Summary         Notes:   Urinalysis today does look more concerning for infectious process. Urine culture from 2 weeks ago was noted to be negative. Repeat culture sent today. Renal ultrasound  shows significant right-sided hydronephrosis. Given concerning UA findings today, I did proceed with CT imaging. CT imaging reveals severe right hydroureteronephrosis with approximately 4-5 obstructing mid ureteral calculi noted. Nonobstructive calculi noted on the left. I do not appreciate any obvious left ureteral calculi or hydronephrosis. Radiology impression this pending. Patient is currently afebrile in clinic; however, I am concerned about findings today in clinic. Case reviewed with the on-call urologist. Will plan to proceed to the operating room for cystoscopy, right retrograde pyelogram, and right ureteral stent placement given concern for underlying infectious process. We reviewed the procedure and potential risk in detail. She is aware. She voiced understanding and would like to proceed. She was given an injection of Rocephin 1 g in clinic at approximately 12:30 p.m.Marland Kitchen. She was instructed to remain NPO present [facility] as instructed.

## 2019-12-12 NOTE — Anesthesia Procedure Notes (Signed)
Procedure Name: LMA Insertion Date/Time: 12/12/2019 4:52 PM Performed by: Paris Lore, CRNA Pre-anesthesia Checklist: Patient identified, Emergency Drugs available, Suction available, Patient being monitored and Timeout performed Patient Re-evaluated:Patient Re-evaluated prior to induction Oxygen Delivery Method: Circle system utilized Preoxygenation: Pre-oxygenation with 100% oxygen Induction Type: IV induction Ventilation: Mask ventilation without difficulty LMA: LMA inserted LMA Size: 4.0 Number of attempts: 1 Placement Confirmation: positive ETCO2 and breath sounds checked- equal and bilateral Tube secured with: Tape

## 2019-12-13 ENCOUNTER — Encounter (HOSPITAL_COMMUNITY): Payer: Self-pay | Admitting: Urology

## 2019-12-13 DIAGNOSIS — N201 Calculus of ureter: Secondary | ICD-10-CM | POA: Diagnosis not present

## 2019-12-13 LAB — BASIC METABOLIC PANEL
Anion gap: 8 (ref 5–15)
BUN: 16 mg/dL (ref 6–20)
CO2: 17 mmol/L — ABNORMAL LOW (ref 22–32)
Calcium: 8.5 mg/dL — ABNORMAL LOW (ref 8.9–10.3)
Chloride: 111 mmol/L (ref 98–111)
Creatinine, Ser: 0.9 mg/dL (ref 0.44–1.00)
GFR calc Af Amer: >60 mL/min (ref >60)
GFR calc non Af Amer: >60 mL/min (ref >60)
Glucose, Bld: 144 mg/dL — ABNORMAL HIGH (ref 70–99)
Potassium: 4 mmol/L (ref 3.5–5.1)
Sodium: 136 mmol/L (ref 135–145)

## 2019-12-13 LAB — CBC
HCT: 35.4 % — ABNORMAL LOW (ref 36.0–46.0)
Hemoglobin: 11.3 g/dL — ABNORMAL LOW (ref 12.0–15.0)
MCH: 25.1 pg — ABNORMAL LOW (ref 26.0–34.0)
MCHC: 31.9 g/dL (ref 30.0–36.0)
MCV: 78.5 fL — ABNORMAL LOW (ref 80.0–100.0)
Platelets: 168 10*3/uL (ref 150–400)
RBC: 4.51 MIL/uL (ref 3.87–5.11)
RDW: 14.7 % (ref 11.5–15.5)
WBC: 4 10*3/uL (ref 4.0–10.5)
nRBC: 0 % (ref 0.0–0.2)

## 2019-12-13 NOTE — Discharge Instructions (Signed)

## 2019-12-13 NOTE — Discharge Summary (Signed)
Date of admission: 12/12/2019  Date of discharge: 12/13/2019  Admission diagnosis: Right ureteral calculi and UTI  Discharge diagnosis: Right ureteral calculi and UTI  History and Physical: For full details, please see admission history and physical. Briefly, Phyllis House is a 44 y.o. year old patient who presented to our office yesterday with symptoms of right flank pain.  She has a longstanding history of recurrent urolithiasis.  She was found multiple right ureteral calculi and a urinalysis concerning for urinary tract infection.  She was started on IM ceftriaxone in the office.  Hospital Course: She underwent right ureteral stent placement that evening and was observed overnight.  She remained afebrile and her pain was resolved the following morning.  She was able to be discharged home after receiving an additional dose of ceftriaxone on postoperative day 1.  She will be discharged home on empiric cefdinir with plans to follow-up on her cultures.  Laboratory values:  Recent Labs    12/13/19 0430  HGB 11.3*  HCT 35.4*   Recent Labs    12/13/19 0430  CREATININE 0.90    Disposition: Home  Discharge instruction: She will follow up with Dr. Mena Goes.  This will be arranged through our office next week.  Discharge medications:  Allergies as of 12/13/2019      Reactions   Adhesive [tape] Rash   Pt states paper tape causes rash, but "plastic tape" is not problematic.       Medication List    STOP taking these medications   ibuprofen 200 MG tablet Commonly known as: ADVIL     TAKE these medications   acetaminophen 500 MG tablet Commonly known as: TYLENOL Take 1,000 mg by mouth every 6 (six) hours as needed for moderate pain.   cefdinir 300 MG capsule Commonly known as: OMNICEF Take 1 capsule (300 mg total) by mouth 2 (two) times daily.   ketorolac 10 MG tablet Commonly known as: TORADOL Take 10 mg by mouth every 6 (six) hours as needed for pain.   levonorgestrel  20 MCG/24HR IUD Commonly known as: MIRENA 1 each by Intrauterine route once. February 2014   oxyCODONE-acetaminophen 5-325 MG tablet Commonly known as: PERCOCET/ROXICET Take 1-2 tablets by mouth every 6 (six) hours as needed for severe pain.   potassium citrate 10 MEQ (1080 MG) SR tablet Commonly known as: UROCIT-K Take 10 mEq by mouth 2 (two) times daily.   tamsulosin 0.4 MG Caps capsule Commonly known as: FLOMAX Take 0.4 mg by mouth daily.       Followup:   Follow-up Information    [name], MD Follow up.   Specialty: Urology Why: Office to call to schedule for 1-2 weeks Contact information: [address] [place] [address] [phone]

## 2019-12-13 NOTE — Plan of Care (Signed)
  Problem: Education: Goal: Knowledge of General Education information will improve Description Including pain rating scale, medication(s)/side effects and non-pharmacologic comfort measures Outcome: Progressing   

## 2019-12-13 NOTE — Progress Notes (Signed)
Patient ID: Phyllis House, female   DOB: [birth year], 44 y.o.   MRN: [id number]  1 Day Post-Op Subjective: Doing well s/p stent placement.  Concern for UTI on UA yesterday.  No fever overnight.  Objective: Vital signs in last 24 hours: Temp:  [97.6 F (36.4 C)-98.3 F (36.8 C)] 98.1 F (36.7 C) (08/14 0626) Pulse Rate:  [50-67] 52 (08/14 0626) Resp:  [14-20] 18 (08/14 0626) BP: (92-129)/(55-79) 110/61 (08/14 0626) SpO2:  [96 %-100 %] 99 % (08/14 0626) Weight:  [82.8 kg] 82.8 kg (08/13 1513)  Intake/Output from previous day: 08/13 0701 - 08/14 0700 In: 1216 [I.V.:1216] Out: 1 [Blood:1] Intake/Output this shift: No intake/output data recorded.  Physical Exam:  General: Alert and oriented GU: No CVAT  Lab Results: Recent Labs    12/13/19 0430  HGB 11.3*  HCT 35.4*   BMET Recent Labs    12/13/19 0430  NA 136  K 4.0  CL 111  CO2 17*  GLUCOSE 144*  BUN 16  CREATININE 0.90  CALCIUM 8.5*    Assessment/Plan: 1) Right ureteral calculi/UTI: Doing well s/p stent.  She received ceftriaxone this morning already.  Will d/c home on empiric antibiotics with plans for her to f/u with Dr. Mena Goes after UTI resolved and to plan for definitive stone treatment.  Will f/u on culture.   LOS: 1 day   Crecencio Mc 12/13/2019, 10:09 AM

## 2019-12-13 NOTE — Progress Notes (Signed)
Pt discharged to home, instructions reviewed with pt. Pt acknowledged understanding. SRP, RN

## 2019-12-16 ENCOUNTER — Other Ambulatory Visit: Payer: Self-pay | Admitting: Urology

## 2019-12-24 ENCOUNTER — Other Ambulatory Visit (HOSPITAL_COMMUNITY): Payer: BC Managed Care – PPO

## 2019-12-25 ENCOUNTER — Encounter (HOSPITAL_COMMUNITY): Payer: Self-pay

## 2019-12-25 NOTE — Progress Notes (Addendum)
PCP - Fleet Contras MD Cardiologist - no  PPM/ICD -  Device Orders -  Rep Notified -   Chest x-ray -  EKG -  Stress Test -  ECHO -  Cardiac Cath -   Sleep Study -  CPAP -   Fasting Blood Sugar -  Checks Blood Sugar _____ times a day  Blood Thinner Instructions: Aspirin Instructions:  ERAS Protcol - PRE-SURGERY Ensure or G2-   COVID TEST- 12-26-19  Activity - able to walk a flight of stairs without sob Anesthesia review:   Patient denies shortness of breath, fever, cough and chest pain at PAT appointment  NONE    All instructions explained to the patient, with a verbal understanding of the material. Patient agrees to go over the instructions while at home for a better understanding. Patient also instructed to self quarantine after being tested for COVID-19. The opportunity to ask questions was provided.

## 2019-12-25 NOTE — Patient Instructions (Addendum)
YOU ARE SCHEDULED FOR A COVID TEST __8-27-21_______@____________ . THIS TEST MUST BE DONE BEFORE SURGERY. GO TO  [address] [place], [place], IT IS APPROXIMATELY 2 MINUTES PAST [employer] ON THE RIGHT AND REMAIN IN YOUR CAR, THIS IS A DRIVE UP TEST. ONCE YOUR COVID TEST IS DONE PLEASE FOLLOW ALL THE QUARANTINE  INSTRUCTIONS GIVEN IN YOUR HANDOUT.      Your procedure is scheduled on    12-30-19   Report to [facility] AT          12:30  P. M.   Call this number if you have problems the morning of surgery  [phone].   OUR ADDRESS IS [address].  WE ARE LOCATED IN [facility].  PLEASE BRING YOUR INSURANCE CARD AND PHOTO ID DAY OF SURGERY.  ONLY ONE PERSON ALLOWED IN FACILITY WAITING AREA.                                     REMEMBER:  DO NOT EAT FOOD AFTER MIDNIGHT .  You may have clear liquids until 1130 am then nothing by mouth.    CLEAR LIQUID DIET    Until 1130 am then nothing by mouth   Foods Allowed                                                                 Coffee and tea, regular and decaf  No creamer                            Plain Jell-O any favor except red or purple                                         Fruit ices (not with fruit pulp)                                      Iced Popsicles                                     Carbonated beverages, regular and diet                                    Cranberry, grape and apple juices Sports drinks like Gatorade Lightly seasoned clear broth or consume(fat free) Sugar, honey syrup  _____________________________________________________________________    YOU MAY  BRUSH YOUR TEETH MORNING OF SURGERY AND RINSE YOUR MOUTH OUT, NO CHEWING GUM CANDY OR MINTS.   TAKE THESE MEDICATIONS MORNING OF SURGERY WITH A SIP OF WATER:  __NONE________________________________  ONE VISITOR IS ALLOWED IN WAITING ROOM ONLY DAY OF SURGERY.  NO VISITOR MAY SPEND THE NIGHT.   VISITOR ARE ALLOWED TO STAY UNTIL 800 PM.  DO NOT WEAR JEWERLY, MAKE UP, OR NAIL POLISH ON FINGERNAILS. DO NOT WEAR LOTIONS, POWDERS, PERFUMES OR DEODORANT. DO NOT SHAVE FOR 24 HOURS PRIOR TO DAY OF SURGERY.  CONTACTS, GLASSES, OR DENTURES MAY NOT BE WORN TO SURGERY.                                    [facility] IS NOT RESPONSIBLE  FOR ANY BELONGINGS.                                                                    Marland Kitchen                              [place] - Preparing for Surgery Before surgery, you can play an important role.  Because skin is not sterile, your skin needs to be as free of germs as possible.  You can reduce the number of germs on your skin by washing with CHG (chlorahexidine gluconate) soap before surgery.  CHG is an antiseptic cleaner which kills germs and bonds with the skin to continue killing germs even after washing. Please DO NOT use if you have an allergy to CHG or antibacterial soaps.  If your skin becomes reddened/irritated stop using the CHG and inform your nurse when you arrive at Short Stay. Do not shave (including legs and underarms) for at least 48 hours prior to the first CHG shower.  You may shave your face/neck. Please follow these instructions carefully:  1.  Shower with CHG Soap the night before surgery and the  morning of Surgery.  2.  If you choose to wash your hair, wash your hair first as usual with your  normal  shampoo.  3.  After you shampoo, rinse your hair and body thoroughly to remove the  shampoo.                           4.  Use CHG as you would any other liquid soap.  You can apply chg directly  to the skin and wash                       Gently with a scrungie or clean washcloth.  5.  Apply the CHG Soap to your body ONLY FROM THE NECK DOWN.   Do not use on face/ open                           Wound or open sores. Avoid contact with eyes, ears mouth and genitals (private parts).                       Wash  face,  Genitals (private parts) with your normal soap.             6.  Wash thoroughly, paying special attention to the area where your surgery  will be performed.  7.  Thoroughly rinse your body with warm water from the neck down.  8.  DO NOT shower/wash with your normal soap  after using and rinsing off  the CHG Soap.                9.  Pat yourself dry with a clean towel.            10.  Wear clean pajamas.            11.  Place clean sheets on your bed the night of your first shower and do not  sleep with pets. Day of Surgery : Do not apply any lotions/deodorants the morning of surgery.  Please wear clean clothes to the hospital/surgery center.  FAILURE TO FOLLOW THESE INSTRUCTIONS MAY RESULT IN THE CANCELLATION OF YOUR SURGERY PATIENT SIGNATURE_________________________________  NURSE SIGNATURE__________________________________  ________________________________________________________________________

## 2019-12-26 ENCOUNTER — Encounter (HOSPITAL_COMMUNITY): Payer: Self-pay

## 2019-12-26 ENCOUNTER — Other Ambulatory Visit: Payer: Self-pay

## 2019-12-26 ENCOUNTER — Encounter (HOSPITAL_COMMUNITY)
Admission: RE | Admit: 2019-12-26 | Discharge: 2019-12-26 | Disposition: A | Discharge status: Home / Self Care | Payer: BC Managed Care – PPO | Source: Ambulatory Visit | Attending: Urology | Admitting: Urology

## 2019-12-26 ENCOUNTER — Other Ambulatory Visit (HOSPITAL_COMMUNITY)
Admission: RE | Admit: 2019-12-26 | Discharge: 2019-12-26 | Disposition: A | Discharge status: Home / Self Care | Payer: BC Managed Care – PPO | Source: Ambulatory Visit | Attending: Urology | Admitting: Urology

## 2019-12-26 DIAGNOSIS — Z01812 Encounter for preprocedural laboratory examination: Secondary | ICD-10-CM | POA: Insufficient documentation

## 2019-12-26 DIAGNOSIS — Z20822 Contact with and (suspected) exposure to covid-19: Secondary | ICD-10-CM | POA: Insufficient documentation

## 2019-12-26 HISTORY — DX: Pneumonia, unspecified organism: J18.9

## 2019-12-26 HISTORY — DX: Other disorders resulting from impaired renal tubular function: N25.89

## 2019-12-26 LAB — BASIC METABOLIC PANEL
Anion gap: 10 (ref 5–15)
BUN: 13 mg/dL (ref 6–20)
CO2: 18 mmol/L — ABNORMAL LOW (ref 22–32)
Calcium: 8.9 mg/dL (ref 8.9–10.3)
Chloride: 109 mmol/L (ref 98–111)
Creatinine, Ser: 0.82 mg/dL (ref 0.44–1.00)
GFR calc Af Amer: >60 mL/min (ref >60)
GFR calc non Af Amer: >60 mL/min (ref >60)
Glucose, Bld: 99 mg/dL (ref 70–99)
Potassium: 3.7 mmol/L (ref 3.5–5.1)
Sodium: 137 mmol/L (ref 135–145)

## 2019-12-26 LAB — CBC
HCT: 33.7 % — ABNORMAL LOW (ref 36.0–46.0)
Hemoglobin: 10.9 g/dL — ABNORMAL LOW (ref 12.0–15.0)
MCH: 24.8 pg — ABNORMAL LOW (ref 26.0–34.0)
MCHC: 32.3 g/dL (ref 30.0–36.0)
MCV: 76.8 fL — ABNORMAL LOW (ref 80.0–100.0)
Platelets: 159 10*3/uL (ref 150–400)
RBC: 4.39 MIL/uL (ref 3.87–5.11)
RDW: 15.1 % (ref 11.5–15.5)
WBC: 3.2 10*3/uL — ABNORMAL LOW (ref 4.0–10.5)
nRBC: 0 % (ref 0.0–0.2)

## 2019-12-26 LAB — SARS CORONAVIRUS 2 (TAT 6-24 HRS): SARS Coronavirus 2: NEGATIVE

## 2019-12-30 ENCOUNTER — Encounter (HOSPITAL_BASED_OUTPATIENT_CLINIC_OR_DEPARTMENT_OTHER)
Admission: RE | Disposition: A | Discharge status: Home / Self Care | Payer: Self-pay | Source: Ambulatory Visit | Attending: Urology

## 2019-12-30 ENCOUNTER — Ambulatory Visit (HOSPITAL_BASED_OUTPATIENT_CLINIC_OR_DEPARTMENT_OTHER): Payer: BC Managed Care – PPO | Admitting: Anesthesiology

## 2019-12-30 ENCOUNTER — Ambulatory Visit (HOSPITAL_COMMUNITY)
Admission: RE | Admit: 2019-12-30 | Discharge: 2019-12-30 | Disposition: A | Discharge status: Home / Self Care | Payer: BC Managed Care – PPO | Source: Ambulatory Visit | Attending: Urology | Admitting: Urology

## 2019-12-30 ENCOUNTER — Encounter (HOSPITAL_BASED_OUTPATIENT_CLINIC_OR_DEPARTMENT_OTHER): Payer: Self-pay | Admitting: Urology

## 2019-12-30 DIAGNOSIS — Z79899 Other long term (current) drug therapy: Secondary | ICD-10-CM | POA: Insufficient documentation

## 2019-12-30 DIAGNOSIS — N202 Calculus of kidney with calculus of ureter: Secondary | ICD-10-CM | POA: Diagnosis present

## 2019-12-30 DIAGNOSIS — Z791 Long term (current) use of non-steroidal anti-inflammatories (NSAID): Secondary | ICD-10-CM | POA: Diagnosis not present

## 2019-12-30 DIAGNOSIS — R519 Headache, unspecified: Secondary | ICD-10-CM | POA: Insufficient documentation

## 2019-12-30 DIAGNOSIS — N132 Hydronephrosis with renal and ureteral calculous obstruction: Secondary | ICD-10-CM | POA: Diagnosis not present

## 2019-12-30 DIAGNOSIS — Z87442 Personal history of urinary calculi: Secondary | ICD-10-CM | POA: Diagnosis not present

## 2019-12-30 DIAGNOSIS — N201 Calculus of ureter: Secondary | ICD-10-CM

## 2019-12-30 DIAGNOSIS — R310 Gross hematuria: Secondary | ICD-10-CM | POA: Diagnosis not present

## 2019-12-30 HISTORY — PX: CYSTOSCOPY/URETEROSCOPY/HOLMIUM LASER/STENT PLACEMENT: SHX6546

## 2019-12-30 LAB — POCT PREGNANCY, URINE: Preg Test, Ur: NEGATIVE

## 2019-12-30 SURGERY — CYSTOSCOPY/URETEROSCOPY/HOLMIUM LASER/STENT PLACEMENT
Anesthesia: General | Site: Renal | Laterality: Right

## 2019-12-30 MED ORDER — FENTANYL CITRATE (PF) 100 MCG/2ML IJ SOLN: 25.0000 ug | INTRAMUSCULAR | Status: DC | PRN

## 2019-12-30 MED ORDER — LACTATED RINGERS IV SOLN: INTRAVENOUS | Status: DC

## 2019-12-30 MED ORDER — SCOPOLAMINE 1 MG/3DAYS TD PT72
1.0000 | MEDICATED_PATCH | TRANSDERMAL | Status: DC
  Administered 2019-12-30: 1.5 mg via TRANSDERMAL

## 2019-12-30 MED ORDER — NITROFURANTOIN MACROCRYSTAL 50 MG PO CAPS: 50.0000 mg | ORAL_CAPSULE | Freq: Every day | ORAL | 0 refills | Status: AC

## 2019-12-30 MED ORDER — FENTANYL CITRATE (PF) 100 MCG/2ML IJ SOLN
INTRAMUSCULAR | Status: DC | PRN
Start: 2019-12-30 — End: 2019-12-30
  Administered 2019-12-30 (×2): 50 ug via INTRAVENOUS

## 2019-12-30 MED ORDER — PROPOFOL 10 MG/ML IV BOLUS
INTRAVENOUS | Status: DC | PRN
  Administered 2019-12-30: 200 mg via INTRAVENOUS

## 2019-12-30 MED ORDER — MIDAZOLAM HCL 5 MG/5ML IJ SOLN
INTRAMUSCULAR | Status: DC | PRN
  Administered 2019-12-30: 2 mg via INTRAVENOUS

## 2019-12-30 MED ORDER — LIDOCAINE 2% (20 MG/ML) 5 ML SYRINGE
INTRAMUSCULAR | Status: DC | PRN
  Administered 2019-12-30: 100 mg via INTRAVENOUS

## 2019-12-30 MED ORDER — FENTANYL CITRATE (PF) 100 MCG/2ML IJ SOLN
INTRAMUSCULAR | Status: AC
  Filled 2019-12-30: qty 2

## 2019-12-30 MED ORDER — KETOROLAC TROMETHAMINE 30 MG/ML IJ SOLN
INTRAMUSCULAR | Status: AC
  Filled 2019-12-30: qty 1

## 2019-12-30 MED ORDER — ACETAMINOPHEN 500 MG PO TABS
1000.0000 mg | ORAL_TABLET | Freq: Once | ORAL | Status: AC
  Administered 2019-12-30: 1000 mg via ORAL

## 2019-12-30 MED ORDER — IOHEXOL 300 MG/ML  SOLN
INTRAMUSCULAR | Status: DC | PRN
  Administered 2019-12-30: 5 mL

## 2019-12-30 MED ORDER — SCOPOLAMINE 1 MG/3DAYS TD PT72
MEDICATED_PATCH | TRANSDERMAL | Status: AC
  Filled 2019-12-30: qty 1

## 2019-12-30 MED ORDER — KETOROLAC TROMETHAMINE 30 MG/ML IJ SOLN
INTRAMUSCULAR | Status: DC | PRN
  Administered 2019-12-30: 30 mg via INTRAVENOUS

## 2019-12-30 MED ORDER — ACETAMINOPHEN 500 MG PO TABS
ORAL_TABLET | ORAL | Status: AC
  Filled 2019-12-30: qty 2

## 2019-12-30 MED ORDER — SODIUM CHLORIDE 0.9 % IR SOLN
Status: DC | PRN
  Administered 2019-12-30: 3000 mL

## 2019-12-30 MED ORDER — MIDAZOLAM HCL 2 MG/2ML IJ SOLN
INTRAMUSCULAR | Status: AC
  Filled 2019-12-30: qty 2

## 2019-12-30 MED ORDER — DEXAMETHASONE SODIUM PHOSPHATE 10 MG/ML IJ SOLN
INTRAMUSCULAR | Status: AC
  Filled 2019-12-30: qty 1

## 2019-12-30 MED ORDER — CEFAZOLIN SODIUM-DEXTROSE 2-4 GM/100ML-% IV SOLN
INTRAVENOUS | Status: AC
  Filled 2019-12-30: qty 100

## 2019-12-30 MED ORDER — CEFAZOLIN SODIUM-DEXTROSE 2-4 GM/100ML-% IV SOLN
2.0000 g | Freq: Once | INTRAVENOUS | Status: AC
  Administered 2019-12-30: 2 g via INTRAVENOUS

## 2019-12-30 MED ORDER — ONDANSETRON HCL 4 MG/2ML IJ SOLN
INTRAMUSCULAR | Status: DC | PRN
  Administered 2019-12-30: 4 mg via INTRAVENOUS

## 2019-12-30 MED ORDER — PROPOFOL 10 MG/ML IV BOLUS
INTRAVENOUS | Status: AC
  Filled 2019-12-30: qty 40

## 2019-12-30 MED ORDER — DEXAMETHASONE SODIUM PHOSPHATE 10 MG/ML IJ SOLN
INTRAMUSCULAR | Status: DC | PRN
  Administered 2019-12-30: 10 mg via INTRAVENOUS

## 2019-12-30 MED ORDER — LIDOCAINE 2% (20 MG/ML) 5 ML SYRINGE
INTRAMUSCULAR | Status: AC
  Filled 2019-12-30: qty 5

## 2019-12-30 SURGICAL SUPPLY — 29 items
BAG DRAIN URO-CYSTO SKYTR STRL (DRAIN) ×3 IMPLANT
BAG DRN UROCATH (DRAIN) ×1
CATH URET 5FR 28IN CONE TIP (BALLOONS)
CATH URET 5FR 28IN OPEN ENDED (CATHETERS) ×2 IMPLANT
CATH URET 5FR 70CM CONE TIP (BALLOONS) IMPLANT
CATH URET DUAL LUMEN 6-10FR 50 (CATHETERS) IMPLANT
CLOTH BEACON ORANGE TIMEOUT ST (SAFETY) ×3 IMPLANT
FIBER LASER TRAC TIP (UROLOGICAL SUPPLIES) ×2 IMPLANT
GLOVE BIO SURGEON STRL SZ7 (GLOVE) ×2 IMPLANT
GLOVE BIO SURGEON STRL SZ7.5 (GLOVE) ×3 IMPLANT
GLOVE BIO SURGEON STRL SZ8 (GLOVE) IMPLANT
GLOVE BIOGEL PI IND STRL 7.0 (GLOVE) IMPLANT
GLOVE BIOGEL PI INDICATOR 7.0 (GLOVE) ×4
GLOVE SURG SYN 6.5 ES PF (GLOVE) ×3 IMPLANT
GLOVE SURG SYN 6.5 PF PI (GLOVE) IMPLANT
GOWN STRL REUS W/TWL LRG LVL3 (GOWN DISPOSABLE) ×5 IMPLANT
GUIDEWIRE ANG ZIPWIRE 038X150 (WIRE) ×2 IMPLANT
GUIDEWIRE STR DUAL SENSOR (WIRE) ×3 IMPLANT
GUIDEWIRE ZIPWRE .038 STRAIGHT (WIRE) IMPLANT
IV NS IRRIG 3000ML ARTHROMATIC (IV SOLUTION) ×4 IMPLANT
KIT TURNOVER CYSTO (KITS) ×3 IMPLANT
MANIFOLD NEPTUNE II (INSTRUMENTS) ×3 IMPLANT
NS IRRIG 500ML POUR BTL (IV SOLUTION) ×3 IMPLANT
PACK CYSTO (CUSTOM PROCEDURE TRAY) ×3 IMPLANT
SHEATH URETERAL 12FRX28CM (UROLOGICAL SUPPLIES) ×2 IMPLANT
STENT URET 6FRX24 CONTOUR (STENTS) ×2 IMPLANT
TUBE CONNECTING 12'X1/4 (SUCTIONS) ×1
TUBE CONNECTING 12X1/4 (SUCTIONS) ×2 IMPLANT
TUBING UROLOGY SET (TUBING) ×3 IMPLANT

## 2019-12-30 NOTE — Transfer of Care (Signed)
Immediate Anesthesia Transfer of Care Note  Patient: Phyllis House  Procedure(s) Performed: CYSTOSCOPY, RETROGRADE /URETEROSCOPY/HOLMIUM LASER/STENT EXCHANGE (Right Renal)  Patient Location: PACU  Anesthesia Type:General  Level of Consciousness: drowsy  Airway & Oxygen Therapy: Patient Spontanous Breathing and Patient connected to nasal cannula oxygen  Post-op Assessment: Report given to RN  Post vital signs: Reviewed and stable  Last Vitals:  Vitals Value Taken Time  BP 118/74   Temp    Pulse 58 12/30/19 1532  Resp 8 12/30/19 1532  SpO2 100 % 12/30/19 1532  Vitals shown include unvalidated device data.  Last Pain:  Vitals:   12/30/19 1300  TempSrc: Oral  PainSc: 0-No pain      Patients Stated Pain Goal: 4 (12/30/19 1300)  Complications: No complications documented.

## 2019-12-30 NOTE — Anesthesia Postprocedure Evaluation (Signed)
Anesthesia Post Note  Patient: Aairah Negrette  Procedure(s) Performed: CYSTOSCOPY, RETROGRADE /URETEROSCOPY/HOLMIUM LASER/STENT EXCHANGE (Right Renal)     Patient location during evaluation: PACU Anesthesia Type: General Level of consciousness: awake and alert, oriented and patient cooperative Pain management: pain level controlled Vital Signs Assessment: post-procedure vital signs reviewed and stable Respiratory status: spontaneous breathing, nonlabored ventilation and respiratory function stable Cardiovascular status: blood pressure returned to baseline and stable Postop Assessment: no apparent nausea or vomiting Anesthetic complications: no   No complications documented.  Last Vitals:  Vitals:   12/30/19 1545 12/30/19 1555  BP: 111/80 121/76  Pulse: 63 64  Resp: 18 20  Temp:  36.5 C  SpO2: 98% 97%    Last Pain:  Vitals:   12/30/19 1555  TempSrc:   PainSc: 0-No pain                 Lannie Fields

## 2019-12-30 NOTE — Anesthesia Procedure Notes (Signed)
Procedure Name: LMA Insertion Date/Time: 12/30/2019 2:33 PM Performed by: Briant Sites, CRNA Pre-anesthesia Checklist: Patient identified, Emergency Drugs available, Suction available and Patient being monitored Patient Re-evaluated:Patient Re-evaluated prior to induction Oxygen Delivery Method: Circle system utilized Preoxygenation: Pre-oxygenation with 100% oxygen Induction Type: IV induction Ventilation: Mask ventilation without difficulty LMA: LMA inserted LMA Size: 4.0 Number of attempts: 1 Airway Equipment and Method: Bite block Placement Confirmation: positive ETCO2 Tube secured with: Tape Dental Injury: Teeth and Oropharynx as per pre-operative assessment

## 2019-12-30 NOTE — Interval H&P Note (Signed)
History and Physical Interval Note:  12/30/2019 2:11 PM  Phyllis House  has presented today for surgery, with the diagnosis of RIGHT RENAL AND URETERAL STONE.  The various methods of treatment have been discussed with the patient and family. After consideration of risks, benefits and other options for treatment, the patient has consented to  Procedure(s): CYSTOSCOPY/URETEROSCOPY/HOLMIUM LASER/STENT EXCHANGE (Right) as a surgical intervention.  She is well with no fever or dysuria. Office cx ended up being negative. he patient's history has been reviewed, patient examined, no change in status, stable for surgery.  I have reviewed the patient's chart and labs.  Questions were answered to the patient's satisfaction.     [name]

## 2019-12-30 NOTE — Anesthesia Preprocedure Evaluation (Addendum)
Anesthesia Evaluation  Patient identified by MRN, date of birth, ID band Patient awake    Reviewed: Allergy & Precautions, NPO status , Patient's Chart, lab work & pertinent test results  History of Anesthesia Complications (+) PONV  Airway Mallampati: II  TM Distance: >3 FB Neck ROM: Full    Dental no notable dental hx. (+) Teeth Intact, Dental Advisory Given   Pulmonary neg pulmonary ROS,    Pulmonary exam normal breath sounds clear to auscultation       Cardiovascular negative cardio ROS Normal cardiovascular exam Rhythm:Regular Rate:Normal     Neuro/Psych  Headaches, PSYCHIATRIC DISORDERS Anxiety Depression    GI/Hepatic negative GI ROS, Neg liver ROS,   Endo/Other  PCOS  Renal/GU Renal diseaseRTA Type I  negative genitourinary   Musculoskeletal negative musculoskeletal ROS (+)   Abdominal   Peds  Hematology negative hematology ROS (+)   Anesthesia Other Findings Right ureteral stone  Reproductive/Obstetrics                           Anesthesia Physical Anesthesia Plan  ASA: II  Anesthesia Plan: General   Post-op Pain Management:    Induction: Intravenous  PONV Risk Score and Plan: 3 and Ondansetron, Dexamethasone, Midazolam and Scopolamine patch - Pre-op  Airway Management Planned: LMA  Additional Equipment:   Intra-op Plan:   Post-operative Plan: Extubation in OR  Informed Consent: I have reviewed the patients History and Physical, chart, labs and discussed the procedure including the risks, benefits and alternatives for the proposed anesthesia with the patient or authorized representative who has indicated his/her understanding and acceptance.     Dental advisory given  Plan Discussed with: CRNA  Anesthesia Plan Comments:         Anesthesia Quick Evaluation

## 2019-12-30 NOTE — Op Note (Signed)
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  She was stented urgently for possible UTI and 3 right ureteral stones about 2 weeks ago.  She is brought for definitive stone treatment today.  Findings 4 stones noted in the right ureter.  No renal stones.  Right retrograde pyelogram-this outlined a single ureter single collecting system unit with a turn laterally than medially and then narrowing going into a dilated renal pelvis and collecting system.  Description of procedure: After consent was obtained patient brought to the operating room.  After adequate anesthesia she was placed on lithotomy position and prepped and draped in the usual sterile fashion.  Timeout was performed to confirm the patient and procedure.  Cystoscope was passed per urethra and the right ureteral stent grasped and removed through the urethral meatus.  A sensor wire was advanced but could not make it up through the stent.  The stent was a bit crusty.  The entire stent and wire were removed.  The cystoscope was repassed and the sensor wire advanced but it would not go through the proximal ureter to the kidney.  It kept coiling in the proximal ureter.  A 5 French open-ended catheter was advanced into the proximal ureter and the wire removed.  Retrograde injection of contrast was performed.  This outlined the ureter there was a turn medially and then laterally and a narrowing going into the renal pelvis.  I was able to guide an angled Glidewire into the collecting system.  I then removed the 5 French catheter.  A dual channel semirigid ureteroscope was advanced adjacent to the wire.  The first stone was noted and  it was dusted at a setting of 0.5 and 50 with a 200 m laser fiber.  The next 2 stones were then dusted.  I was then able to make it up into the proximal ureter were no other stones were noted.  I was able to guide the sensor wire now through the UPJ under direct vision.  I then backed the semirigid scope out I passed a short access sheath without difficulty and the dual channel digital.  There was a stone in the proximal ureter and it was dusted.  The collecting system was then carefully inspected and and I noted no renal stones.  Back in the ureter some fragments were noted and they were dusted.  No other significant stone fragments were noted.  Again the renal pelvis was patent it just had a higher insertion with a fold of ureter causing some visual obstruction.  Collecting system and renal pelvis again inspected.  UPJ noted to be intact, the access sheath was backed out on the ureteroscope and the ureter carefully inspected on the way out.  There was no significant stone fragment or injury  The wire was backloaded on the cystoscope and a 6 x 24 cm stent advanced.  The wire was removed with a good coil seen in the kidney and a good coil in the bladder.  The bladder was drained and the scope removed.  I left the string on the stent and it was tucked.  She was awakened taken recovery room in stable condition.  Complications: None  Blood loss: Minimal  Specimens: None  Drains: 6 x 24 cm right  ureteral stent with string-I will have her pulled that out in about 5 days.  Disposition: Patient stable to PACU

## 2019-12-30 NOTE — Discharge Instructions (Signed)
Ureteral Stent Implantation, Care After This sheet gives you information about how to care for yourself after your procedure. Your health care provider may also give you more specific instructions. If you have problems or questions, contact your health care provider.  Removal of the stent: Remove the stent by pulling the string on Sunday morning, January 04, 2020 by pulling the string until the entire stent is removed.  What can I expect after the procedure? After the procedure, it is common to have:  Nausea.  Mild pain when you urinate. You may feel this pain in your lower back or lower abdomen. The pain should stop within a few minutes after you urinate. This may last for up to 1 week.  A small amount of blood in your urine for several days. Follow these instructions at home: Medicines  Take over-the-counter and prescription medicines only as told by your health care provider.  If you were prescribed an antibiotic medicine, take it as told by your health care provider. Do not stop taking the antibiotic even if you start to feel better.  Do not drive for 24 hours if you were given a sedative during your procedure.  Ask your health care provider if the medicine prescribed to you requires you to avoid driving or using heavy machinery. Activity  Rest as told by your health care provider.  Avoid sitting for a long time without moving. Get up to take short walks every 1-2 hours. This is important to improve blood flow and breathing. Ask for help if you feel weak or unsteady.  Return to your normal activities as told by your health care provider. Ask your health care provider what activities are safe for you. General instructions   Watch for any blood in your urine. Call your health care provider if the amount of blood in your urine increases.  If you have a catheter: ? Follow instructions from your health care provider about taking care of your catheter and collection bag. ? Do not  take baths, swim, or use a hot tub until your health care provider approves. Ask your health care provider if you may take showers. You may only be allowed to take sponge baths.  Drink enough fluid to keep your urine pale yellow.  Do not use any products that contain nicotine or tobacco, such as cigarettes, e-cigarettes, and chewing tobacco. These can delay healing after surgery. If you need help quitting, ask your health care provider.  Keep all follow-up visits as told by your health care provider. This is important. Contact a health care provider if:  You have pain that gets worse or does not get better with medicine, especially pain when you urinate.  You have difficulty urinating.  You feel nauseous or you vomit repeatedly during a period of more than 2 days after the procedure. Get help right away if:  Your urine is dark red or has blood clots in it.  You are leaking urine (have incontinence).  The end of the stent comes out of your urethra.  You cannot urinate.  You have sudden, sharp, or severe pain in your abdomen or lower back.  You have a fever.  You have swelling or pain in your legs.  You have difficulty breathing. Summary  After the procedure, it is common to have mild pain when you urinate that goes away within a few minutes after you urinate. This may last for up to 1 week.  Watch for any blood in your urine. Call  your health care provider if the amount of blood in your urine increases.  Take over-the-counter and prescription medicines only as told by your health care provider.  Drink enough fluid to keep your urine pale yellow. This information is not intended to replace advice given to you by your health care provider. Make sure you discuss any questions you have with your health care provider. Document Revised: 01/22/2018 Document Reviewed: 01/23/2018 Elsevier Patient Education  2020 [employer].

## 2019-12-31 ENCOUNTER — Encounter (HOSPITAL_BASED_OUTPATIENT_CLINIC_OR_DEPARTMENT_OTHER): Payer: Self-pay | Admitting: Urology

## 2020-04-05 ENCOUNTER — Other Ambulatory Visit: Payer: Self-pay | Admitting: Urology

## 2020-04-06 ENCOUNTER — Other Ambulatory Visit (HOSPITAL_COMMUNITY)
Admission: RE | Admit: 2020-04-06 | Discharge: 2020-04-06 | Disposition: A | Discharge status: Home / Self Care | Payer: BC Managed Care – PPO | Source: Ambulatory Visit | Attending: Urology | Admitting: Urology

## 2020-04-06 DIAGNOSIS — Z01812 Encounter for preprocedural laboratory examination: Secondary | ICD-10-CM | POA: Insufficient documentation

## 2020-04-06 DIAGNOSIS — Z20822 Contact with and (suspected) exposure to covid-19: Secondary | ICD-10-CM | POA: Insufficient documentation

## 2020-04-06 DIAGNOSIS — N200 Calculus of kidney: Secondary | ICD-10-CM | POA: Diagnosis present

## 2020-04-06 DIAGNOSIS — Z6833 Body mass index (BMI) 33.0-33.9, adult: Secondary | ICD-10-CM | POA: Diagnosis not present

## 2020-04-06 DIAGNOSIS — E669 Obesity, unspecified: Secondary | ICD-10-CM | POA: Diagnosis not present

## 2020-04-06 LAB — SARS CORONAVIRUS 2 (TAT 6-24 HRS): SARS Coronavirus 2: NEGATIVE

## 2020-04-06 NOTE — Progress Notes (Signed)
Patient to arrive at 0800 on 04/07/2020. History and medications reviewed. Pre-procedure instructions given. NPO after MN except for clear liquids until 0600. Driver secured.

## 2020-04-08 ENCOUNTER — Ambulatory Visit (HOSPITAL_BASED_OUTPATIENT_CLINIC_OR_DEPARTMENT_OTHER)
Admission: RE | Admit: 2020-04-08 | Discharge: 2020-04-08 | Disposition: A | Discharge status: Home / Self Care | Payer: BC Managed Care – PPO | Attending: Urology | Admitting: Urology

## 2020-04-08 ENCOUNTER — Encounter (HOSPITAL_BASED_OUTPATIENT_CLINIC_OR_DEPARTMENT_OTHER)
Admission: RE | Disposition: A | Discharge status: Home / Self Care | Payer: Self-pay | Source: Home / Self Care | Attending: Urology

## 2020-04-08 ENCOUNTER — Other Ambulatory Visit: Payer: Self-pay

## 2020-04-08 ENCOUNTER — Ambulatory Visit (HOSPITAL_COMMUNITY): Payer: BC Managed Care – PPO

## 2020-04-08 ENCOUNTER — Encounter (HOSPITAL_BASED_OUTPATIENT_CLINIC_OR_DEPARTMENT_OTHER): Payer: Self-pay | Admitting: Urology

## 2020-04-08 DIAGNOSIS — N200 Calculus of kidney: Secondary | ICD-10-CM | POA: Diagnosis not present

## 2020-04-08 DIAGNOSIS — Z6833 Body mass index (BMI) 33.0-33.9, adult: Secondary | ICD-10-CM | POA: Insufficient documentation

## 2020-04-08 DIAGNOSIS — E669 Obesity, unspecified: Secondary | ICD-10-CM | POA: Insufficient documentation

## 2020-04-08 DIAGNOSIS — Z20822 Contact with and (suspected) exposure to covid-19: Secondary | ICD-10-CM | POA: Insufficient documentation

## 2020-04-08 HISTORY — PX: EXTRACORPOREAL SHOCK WAVE LITHOTRIPSY: SHX1557

## 2020-04-08 LAB — POCT PREGNANCY, URINE: Preg Test, Ur: NEGATIVE

## 2020-04-08 SURGERY — LITHOTRIPSY, ESWL
Anesthesia: LOCAL | Laterality: Left

## 2020-04-08 MED ORDER — SODIUM CHLORIDE 0.9 % IV SOLN: INTRAVENOUS | Status: DC

## 2020-04-08 MED ORDER — DIAZEPAM 5 MG PO TABS
10.0000 mg | ORAL_TABLET | ORAL | Status: AC
  Administered 2020-04-08: 10 mg via ORAL

## 2020-04-08 MED ORDER — DIAZEPAM 5 MG PO TABS
ORAL_TABLET | ORAL | Status: AC
  Filled 2020-04-08: qty 2

## 2020-04-08 MED ORDER — CIPROFLOXACIN HCL 500 MG PO TABS
ORAL_TABLET | ORAL | Status: AC
  Filled 2020-04-08: qty 1

## 2020-04-08 MED ORDER — DIPHENHYDRAMINE HCL 25 MG PO CAPS
25.0000 mg | ORAL_CAPSULE | ORAL | Status: AC
  Administered 2020-04-08: 25 mg via ORAL

## 2020-04-08 MED ORDER — DIPHENHYDRAMINE HCL 25 MG PO CAPS
ORAL_CAPSULE | ORAL | Status: AC
  Filled 2020-04-08: qty 1

## 2020-04-08 MED ORDER — CIPROFLOXACIN HCL 500 MG PO TABS
500.0000 mg | ORAL_TABLET | ORAL | Status: AC
  Administered 2020-04-08: 500 mg via ORAL

## 2020-04-08 MED ORDER — OXYCODONE-ACETAMINOPHEN 5-325 MG PO TABS: 1.0000 | ORAL_TABLET | ORAL | 0 refills | Status: DC | PRN

## 2020-04-08 NOTE — Interval H&P Note (Signed)
History and Physical Interval Note:  04/08/2020 9:09 AM  Phyllis House  has presented today for surgery, with the diagnosis of LEFT RENAL PELVIC STONE.  The various methods of treatment have been discussed with the patient and family. After consideration of risks, benefits and other options for treatment, the patient has consented to  Procedure(s): EXTRACORPOREAL SHOCK WAVE LITHOTRIPSY (ESWL) (Left) as a surgical intervention.  The patient's history has been reviewed, patient examined, no change in status, stable for surgery.  I have reviewed the patient's chart and labs.  Questions were answered to the patient's satisfaction.     Belva Agee

## 2020-04-08 NOTE — H&P (Signed)
/u -   1) 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 Prior serum evaluation with PTH, calcium, uric acid and electrolytes was normal. Tried: HCTZ 25mg - induced symptomatic hypokalemia despite being on Kcitrate. Also, Kcit 20 meq BID caused GI upset.   Right URS Apr 2018 with Dr. May 2018. Repeat labs were done which included normal serum labs. 24-hour urine revealed very low volume, extreme hypocitraturia. Her urine Ca was normal. Urine pH 6.6 - 7.2.   She underwent bilateral ureteroscopy in 01/2018, with the right side being a staged procedure due to 2 large distal right ureteral stones, which were impacted. She did not follow up, as planned, after that procedure.   She saw [facility] and they are working with dietician. Needs another 24 hr urine. Goes back in Oct.   She was taken 08/21 for cysto Right URS/HLL/stent - I was again able to clear the ureter of some stones and look up in the kidney and clear any new stones. She pulled the stent. 9/21 today, 10/21, no hydro and nephrocalcinosis.   2- gross hematuria - Oct 2021 - she's has gross hematuria and clots since her last URS. She feels likes it's bladder or urethra. No flank pain. No dysuria. Passing some finger nail size clots.   Today, Dylana seen for the above. UA with rbcs, few bacteria.   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 Her pain is worse on the right side but is also present on the left. She also endorses gross hematuria and dysuria. She denies any clots. She denies fevers and chills. Her urine is concerning for infectious process today.     ALLERGIES: No Allergies    MEDICATIONS: Advil  Aleve  Ibuprofen  Tylenol     GU PSH: Cysto Remove Stent FB Sim - 2020 Cysto Uretero Lithotripsy, Right - 10/22/2018, 2014 Cystoscopy -  02/03/2020 Cystoscopy And Treatment, Right - 2019, 2008 Cystoscopy Insert Stent, Right - 05/08/2019, Right - 2019, Left - 2019, Right - 2018, 2014, 2014 ESWL, Left - 08/07/2019 Ureteroscopic laser litho, Right - 12/30/2019, Bilateral - 06/03/2019, Right - 02/11/2019, Right - 10/22/2018, Bilateral - 2020, Left - 2019, Right - 2019, Right - 2018 Ureteroscopic stone removal, Right - 2018       PSH Notes: Cystoscopy With Ureteroscopy With Lithotripsy, Cystoscopy With Insertion Of Ureteral Stent Left, Cystoscopy With Insertion Of Ureteral Stent Left, Cystoscopy With Manipulation Of Ureteral Calculus   NON-GU PSH: No Non-GU PSH    GU PMH: Gross hematuria, This is likely upper tract or papilla bleeding from her last surgery. There is no hydro. If it persists would consider CT IVP or cysto/RGP/URS. - 02/03/2020, - 2020 Renal calculus, Renal [birth year]03[birth year]1975 in 3 mo - 02/03/2020, - 12/12/2019, KUB and 12/14/2019 look good. See in 6 mo with [place]. , - 11/19/2019, - 10/30/2019, - 10/01/2019, - 08/21/2019, - 08/05/2019, - 05/08/2019, - 03/04/2019, - 12/03/2018, - 2020, - 2020, - 2020, - 2020, - 2018, Nephrolithiasis, - 2015 Flank Pain - 02/03/2019, - 01/17/2019, - 12/24/2018, - 2020 Acute Cystitis/UTI - 2020 LLQ pain - 2020 RLQ pain - 2020 Mixed incontinence, Mixed stress and urge urinary incontinence - 2015 Urinary Tract Inf, Unspec site, Urinary tract infection - 2015, Pyuria, - 2015 Hydronephrosis Unspec, Hydronephrosis On The Right - 2014 Ureteral calculus, Ureteral Stone - 2014    NON-GU PMH: Bacteriuria - 2018 Encounter  for general adult medical examination without abnormal findings, Encounter for preventive health examination - 2015 Hypokalemia, Hypokalemia - 2015 Hypercalciuria, Hypercalciuria - 2014 Personal history of other diseases of the nervous system and sense organs, History of migraine headaches - 2014 Personal history of other specified conditions, History of nausea - 2014    FAMILY HISTORY: Family Health Status Number - Runs In  Family Father Deceased At Age86 ___ - Runs In Family nephrolithiasis - Mother, Grandfather, Aunt, Uncle   SOCIAL HISTORY: Marital Status: Single Preferred Language: English; Ethnicity: Not Hispanic Or Latino; Race: White Current Smoking Status: Patient has never smoked.   Tobacco Use Assessment Completed: Used Tobacco in last 30 days? Does not use smokeless tobacco. Has never drank.  Does not use drugs. Drinks 3 caffeinated drinks per day. Has not had a blood transfusion. 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     Notes: Never A Smoker, Drug Use, Caffeine Use, Marital History - Single, Alcohol Use   REVIEW OF SYSTEMS:    GU Review Female:   Patient reports burning /pain with urination. Patient denies frequent urination, hard to postpone urination, get up at night to urinate, leakage of urine, stream starts and stops, trouble starting your stream, have to strain to urinate, and being pregnant.  Gastrointestinal (Upper):   Patient denies nausea, vomiting, and indigestion/ heartburn.  Gastrointestinal (Lower):   Patient denies diarrhea and constipation.  Constitutional:   Patient denies fever, night sweats, weight loss, and fatigue.  Skin:   Patient denies skin rash/ lesion and itching.  Eyes:   Patient denies blurred vision and double vision.  Ears/ Nose/ Throat:   Patient denies sore throat and sinus problems.  Hematologic/Lymphatic:   Patient denies swollen glands and easy bruising.  Cardiovascular:   Patient denies leg swelling and chest pains.  Respiratory:   Patient denies cough and shortness of breath.  Endocrine:   Patient denies excessive thirst.  Musculoskeletal:   Patient denies back pain and joint pain.  Neurological:   Patient denies headaches and dizziness.  Psychologic:   Patient denies depression and anxiety.   Notes: hematuria x 1week pain is intermittent     VITAL SIGNS:      04/01/2020 03:47 PM  BP 129/84 mmHg  Heart Rate 74 /min  Temperature 98.9 F /  37.1 C   MULTI-SYSTEM PHYSICAL EXAMINATION:    Constitutional: Well-nourished. No physical deformities. Normally developed. Good grooming.  Respiratory: No labored breathing, no use of accessory muscles.   Cardiovascular: Normal temperature, normal extremity pulses, no swelling, no varicosities.  Neurologic / Psychiatric: Oriented to time, oriented to place, oriented to person. No depression, no anxiety, no agitation.  Gastrointestinal: No mass, no tenderness, no rigidity, non obese abdomen.  Musculoskeletal: Normal gait and station of head and neck.     Complexity of Data:  Source Of History:  Patient, Medical Record Summary  Records Review:   Previous Doctor Records, Previous Hospital Records, Previous Patient Records  Urine Test Review:   Urinalysis, Urine Culture  X-Ray Review: C.T. Abdomen/Pelvis: Reviewed Films. Reviewed Report.     04/01/20  Urinalysis  Urine Appearance Cloudy   Urine Color Red   Urine Glucose Neg mg/dL  Urine Bilirubin Neg mg/dL  Urine Ketones Neg mg/dL  Urine Specific Gravity 1.020   Urine Blood 3+ ery/uL  Urine pH 7.5   Urine Protein 1+ mg/dL  Urine Urobilinogen 0.2 mg/dL  Urine Nitrites Neg   Urine Leukocyte Esterase 2+ leu/uL  Urine WBC/hpf 0 - 5/hpf  Urine RBC/hpf >60/hpf   Urine Epithelial Cells NS (Not Seen)   Urine Bacteria Few (10-25/hpf)   Urine Mucous Not Present   Urine Yeast NS (Not Seen)   Urine Trichomonas Not Present   Urine Cystals Amorph Phosphates   Urine Casts NS (Not Seen)   Urine Sperm Not Present    PROCEDURES:         C.T. Urogram - [id number]      Patient confirmed No Neulasta OnPro Device.         Urinalysis w/Scope Dipstick Dipstick Cont'd Micro  Color: Red Bilirubin: Neg mg/dL WBC/hpf: 0 - 5/hpf  Appearance: Cloudy Ketones: Neg mg/dL RBC/hpf: >62/GBT  Specific Gravity: 1.020 Blood: 3+ ery/uL Bacteria: Few (10-25/hpf)  pH: 7.5 Protein: 1+ mg/dL Cystals: Amorph Phosphates  Glucose: Neg mg/dL Urobilinogen: 0.2 mg/dL  Casts: NS (Not Seen)    Nitrites: Neg Trichomonas: Not Present    Leukocyte Esterase: 2+ leu/uL Mucous: Not Present      Epithelial Cells: NS (Not Seen)      Yeast: NS (Not Seen)      Sperm: Not Present    Notes: Unspun micro due to clarity    ASSESSMENT:      ICD-10 Details  1 GU:   Renal calculus - N20.0 Left, Chronic, Exacerbation   PLAN:            Medications New Meds: Ketorolac Tromethamine 10 mg tablet 1 tablet PO Q 6 H   #20  0 Refill(s)  Cephalexin 250 mg tablet 1 tablet PO BID   #14  0 Refill(s)  Oxycodone Hcl 5 mg capsule 1 capsule PO Q 4 H PRN For severe pain  #30  0 Refill(s)  Promethazine Hcl 12.5 mg tablet 1 tablet PO Q 6 H PRN for nausea  #30  0 Refill(s)            Orders Labs CULTURE, URINE  X-Rays: C.T. Stone Protocol Without Contrast - Gross hematuria, Flank pain, RUS from 2 months ago  X-Ray Notes: History:  Hematuria: Yes/No  Patient to see MD after exam: Yes/No  Previous exam: CT / IVP/ US/ KUB/ None  When:  Where:  Diabetic: Yes/ No  BUN/ Creatine:  Date of last BUN Creatinine:  Weight in pounds:  Allergy- Contrasts/ Shellfish: Yes/ No  Conflicting diabetic meds: Yes/ No  Oral contrast and instructions given to patient:   Prior Authorization #: [id number] valid 04/01/20 thru 09/27/20            Schedule         Document Letter(s):  Created for Patient: Clinical Summary         Notes:   Urinalysis with some infectious parameters. I am going to send for culture. But I would like to empirically begin treatment. CT scan does show a large stone within her renal pelvis that could be ball valving and causing pain on the left kidney. Stone size is approximately 10 mm. This is changed from her previous CT scan 3 months ago. I am going to speak with her urologist and await final radiology read. I will follow up with her regarding decision on ureteroscopy versus ESWL versus PCNL. The pain medication sent to her pharmacy. She understands  strict return precautions for any worsening symptomatology including fever, chills, worsening pain or inability to take oral intake die to vomiting or nausea.

## 2020-04-08 NOTE — Discharge Instructions (Signed)
Lithotripsy, Care After This sheet gives you information about how to care for yourself after your procedure. Your health care provider may also give you more specific instructions. If you have problems or questions, contact your health care provider. What can I expect after the procedure? After the procedure, it is common to have:  Some blood in your urine. This should only last for a few days.  Soreness in your back, sides, or upper abdomen for a few days.  Blotches or bruises on your back where the pressure wave entered the skin.  Pain, discomfort, or nausea when pieces (fragments) of the kidney stone move through the tube that carries urine from the kidney to the bladder (ureter). Stone fragments may pass soon after the procedure, but they may continue to pass for up to 4-8 weeks. ? If you have severe pain or nausea, contact your health care provider. This may be caused by a large stone that was not broken up, and this may mean that you need more treatment.  Some pain or discomfort during urination.  Some pain or discomfort in the lower abdomen or (in men) at the base of the penis. Follow these instructions at home: Medicines  Take over-the-counter and prescription medicines only as told by your health care provider.  If you were prescribed an antibiotic medicine, take it as told by your health care provider. Do not stop taking the antibiotic even if you start to feel better.  Do not drive for 24 hours if you were given a medicine to help you relax (sedative).  Do not drive or use heavy machinery while taking prescription pain medicine. Eating and drinking      Drink enough water and fluids to keep your urine clear or pale yellow. This helps any remaining pieces of the stone to pass. It can also help prevent new stones from forming.  Eat plenty of fresh fruits and vegetables.  Follow instructions from your health care provider about eating and drinking restrictions. You may be  instructed: ? To reduce how much salt (sodium) you eat or drink. Check ingredients and nutrition facts on packaged foods and beverages. ? To reduce how much meat you eat.  Eat the recommended amount of calcium for your age and gender. Ask your health care provider how much calcium you should have. General instructions  Get plenty of rest.  Most people can resume normal activities 1-2 days after the procedure. Ask your health care provider what activities are safe for you.  Your health care provider may direct you to lie in a certain position (postural drainage) and tap firmly (percuss) over your kidney area to help stone fragments pass. Follow instructions as told by your health care provider.  If directed, strain all urine through the strainer that was provided by your health care provider. ? Keep all fragments for your health care provider to see. Any stones that are found may be sent to a medical lab for examination. The stone may be as small as a grain of salt.  Keep all follow-up visits as told by your health care provider. This is important. Contact a health care provider if:  You have pain that is severe or does not get better with medicine.  You have nausea that is severe or does not go away.  You have blood in your urine longer than your health care provider told you to expect.  You have more blood in your urine.  You have pain during urination that does   not go away.  You urinate more frequently than usual and this does not go away.  You develop a rash or any other possible signs of an allergic reaction. Get help right away if:  You have severe pain in your back, sides, or upper abdomen.  You have severe pain while urinating.  Your urine is very dark red.  You have blood in your stool (feces).  You cannot pass any urine at all.  You feel a strong urge to urinate after emptying your bladder.  You have a fever or chills.  You develop shortness of breath,  difficulty breathing, or chest pain.  You have severe nausea that leads to persistent vomiting.  You faint. Summary  After this procedure, it is common to have some pain, discomfort, or nausea when pieces (fragments) of the kidney stone move through the tube that carries urine from the kidney to the bladder (ureter). If this pain or nausea is severe, however, you should contact your health care provider.  Most people can resume normal activities 1-2 days after the procedure. Ask your health care provider what activities are safe for you.  Drink enough water and fluids to keep your urine clear or pale yellow. This helps any remaining pieces of the stone to pass, and it can help prevent new stones from forming.  If directed, strain your urine and keep all fragments for your health care provider to see. Fragments or stones may be as small as a grain of salt.  Get help right away if you have severe pain in your back, sides, or upper abdomen or have severe pain while urinating. This information is not intended to replace advice given to you by your health care provider. Make sure you discuss any questions you have with your health care provider. Document Revised: 07/29/2018 Document Reviewed: 03/08/2016 [employer] Patient Education  2020 [employer]  

## 2020-04-08 NOTE — Brief Op Note (Signed)
04/08/2020  10:14 AM  PATIENT:  Phyllis House  44 y.o. female  PRE-OPERATIVE DIAGNOSIS:  LEFT RENAL PELVIC STONE  POST-OPERATIVE DIAGNOSIS:  * No post-op diagnosis entered *  PROCEDURE:  Procedure(s): EXTRACORPOREAL SHOCK WAVE LITHOTRIPSY (ESWL) (Left)  SURGEON:  Surgeon(s) and Role:    * Belva Agee, MD - Primary  PHYSICIAN ASSISTANT:   ASSISTANTS: none   ANESTHESIA:   IV sedation  EBL:  minimal   BLOOD ADMINISTERED:none  DRAINS: none   LOCAL MEDICATIONS USED:  NONE  SPECIMEN:  No Specimen  DISPOSITION OF SPECIMEN:  N/A  COUNTS:  YES  TOURNIQUET:  * No tourniquets in log *  DICTATION: .Note written in EPIC  PLAN OF CARE: Discharge to home after PACU  PATIENT DISPOSITION:  PACU - hemodynamically stable.   Delay start of Pharmacological VTE agent (>24hrs) due to surgical blood loss or risk of bleeding: yes

## 2020-04-09 ENCOUNTER — Encounter (HOSPITAL_BASED_OUTPATIENT_CLINIC_OR_DEPARTMENT_OTHER): Payer: Self-pay | Admitting: Urology

## 2020-09-30 ENCOUNTER — Ambulatory Visit: Payer: Self-pay | Admitting: Podiatry

## 2020-10-06 ENCOUNTER — Ambulatory Visit (INDEPENDENT_AMBULATORY_CARE_PROVIDER_SITE_OTHER): Payer: BC Managed Care – PPO | Admitting: Podiatry

## 2020-10-06 ENCOUNTER — Other Ambulatory Visit: Payer: Self-pay

## 2020-10-06 ENCOUNTER — Ambulatory Visit (INDEPENDENT_AMBULATORY_CARE_PROVIDER_SITE_OTHER): Payer: BC Managed Care – PPO

## 2020-10-06 ENCOUNTER — Other Ambulatory Visit: Payer: Self-pay | Admitting: Podiatry

## 2020-10-06 ENCOUNTER — Encounter: Payer: Self-pay | Admitting: Podiatry

## 2020-10-06 DIAGNOSIS — M79672 Pain in left foot: Secondary | ICD-10-CM | POA: Diagnosis not present

## 2020-10-06 DIAGNOSIS — M7662 Achilles tendinitis, left leg: Secondary | ICD-10-CM

## 2020-10-06 DIAGNOSIS — E669 Obesity, unspecified: Secondary | ICD-10-CM | POA: Insufficient documentation

## 2020-10-06 DIAGNOSIS — F419 Anxiety disorder, unspecified: Secondary | ICD-10-CM | POA: Insufficient documentation

## 2020-10-06 MED ORDER — TRIAMCINOLONE ACETONIDE 10 MG/ML IJ SUSP
10.0000 mg | Freq: Once | INTRAMUSCULAR | Status: AC
  Administered 2020-10-06: 10 mg

## 2020-10-06 MED ORDER — DICLOFENAC SODIUM 75 MG PO TBEC: 75.0000 mg | DELAYED_RELEASE_TABLET | Freq: Two times a day (BID) | ORAL | 2 refills | Status: DC

## 2020-10-06 NOTE — Progress Notes (Signed)
Subjective:   Patient ID: Phyllis House, female   DOB: 45 y.o.   MRN: [id number]   HPI Patient states she has developed severe pain in the back of the left heel and states its been swollen and making it hard for her to walk.  States is been present for around 6 weeks and is quite debilitating for her at the current time.  Patient does not smoke likes to be active   Review of Systems  All other systems reviewed and are negative.       Objective:  Physical Exam Vitals and nursing note reviewed.  Constitutional:      Appearance: She is well-developed.  Pulmonary:     Effort: Pulmonary effort is normal.  Musculoskeletal:        General: Normal range of motion.  Skin:    General: Skin is warm.  Neurological:     Mental Status: She is alert.     Neurovascular status intact muscle strength was found to be adequate range of motion adequate.  Patient is noted to have quite a bit of swelling in the posterior heel left at the insertion of the Achilles into the back of the heel with fluid buildup noted and has a lot of pain associated with this.  No indications of tendon dysfunction or muscle strength loss     Assessment:  Acute Achilles tendinitis left at the insertion mostly on the lateral side and slightly central     Plan:  H&P x-rays reviewed discussed condition at great length and at this point careful injection has been recommended and I did explain prior to doing this the possibility for rupture.  She wants to have this done understanding and I did go ahead and I did sterile prep and injected the lateral side of the Achilles 3 mg dexamethasone Kenalog 5 mg Xylocaine and applied a air fracture walker to completely immobilize along with ice therapy and reappoint again in 3 weeks or earlier if needed.  Placed on diclofenac 75 mg twice daily  X-rays indicate large posterior spur formation left

## 2020-10-06 NOTE — Patient Instructions (Signed)

## 2020-10-27 ENCOUNTER — Other Ambulatory Visit: Payer: Self-pay

## 2020-10-27 ENCOUNTER — Ambulatory Visit: Payer: BC Managed Care – PPO | Admitting: Podiatry

## 2020-10-27 ENCOUNTER — Encounter: Payer: Self-pay | Admitting: Podiatry

## 2020-10-27 DIAGNOSIS — M7662 Achilles tendinitis, left leg: Secondary | ICD-10-CM

## 2020-10-28 NOTE — Progress Notes (Signed)
Subjective:   Patient ID: Phyllis House, female   DOB: 45 y.o.   MRN: [id number]   HPI Patient states she has been doing better but has had discomfort over the last week overall seems to be making some progress   ROS      Objective:  Physical Exam  Neurovascular status intact with continued discomfort of the Achilles tendon left present improved but moderately painful when pressed     Assessment:  Continuation Achilles tendinitis left with moderate improvement     Plan:  Reviewed her boot and I am getting go ahead and get her a new boot today which was dispensed.  I discussed ice therapy anti-inflammatories topical medication stretching and shoe gear modifications heel lift.  Patient will be seen back to recheck

## 2020-11-14 ENCOUNTER — Emergency Department (HOSPITAL_COMMUNITY)
Admission: EM | Admit: 2020-11-14 | Discharge: 2020-11-14 | Disposition: A | Discharge status: Home / Self Care | Payer: BC Managed Care – PPO | Attending: Emergency Medicine | Admitting: Emergency Medicine

## 2020-11-14 ENCOUNTER — Other Ambulatory Visit: Payer: Self-pay

## 2020-11-14 ENCOUNTER — Encounter (HOSPITAL_COMMUNITY): Payer: Self-pay

## 2020-11-14 ENCOUNTER — Emergency Department (HOSPITAL_COMMUNITY): Payer: BC Managed Care – PPO

## 2020-11-14 DIAGNOSIS — U071 COVID-19: Secondary | ICD-10-CM | POA: Diagnosis not present

## 2020-11-14 DIAGNOSIS — N898 Other specified noninflammatory disorders of vagina: Secondary | ICD-10-CM | POA: Diagnosis not present

## 2020-11-14 DIAGNOSIS — R509 Fever, unspecified: Secondary | ICD-10-CM | POA: Diagnosis present

## 2020-11-14 DIAGNOSIS — N201 Calculus of ureter: Secondary | ICD-10-CM | POA: Diagnosis not present

## 2020-11-14 LAB — CBC WITH DIFFERENTIAL/PLATELET
Abs Immature Granulocytes: 0.01 10*3/uL (ref 0.00–0.07)
Basophils Absolute: 0.1 10*3/uL (ref 0.0–0.1)
Basophils Relative: 2 %
Eosinophils Absolute: 0.1 10*3/uL (ref 0.0–0.5)
Eosinophils Relative: 3 %
HCT: 30.6 % — ABNORMAL LOW (ref 36.0–46.0)
Hemoglobin: 9.2 g/dL — ABNORMAL LOW (ref 12.0–15.0)
Immature Granulocytes: 0 %
Lymphocytes Relative: 8 %
Lymphs Abs: 0.3 10*3/uL — ABNORMAL LOW (ref 0.7–4.0)
MCH: 21.1 pg — ABNORMAL LOW (ref 26.0–34.0)
MCHC: 30.1 g/dL (ref 30.0–36.0)
MCV: 70 fL — ABNORMAL LOW (ref 80.0–100.0)
Monocytes Absolute: 0.3 10*3/uL (ref 0.1–1.0)
Monocytes Relative: 10 %
Neutro Abs: 2.6 10*3/uL (ref 1.7–7.7)
Neutrophils Relative %: 77 %
Platelets: 128 10*3/uL — ABNORMAL LOW (ref 150–400)
RBC: 4.37 MIL/uL (ref 3.87–5.11)
RDW: 17.2 % — ABNORMAL HIGH (ref 11.5–15.5)
WBC: 3.4 10*3/uL — ABNORMAL LOW (ref 4.0–10.5)
nRBC: 0 % (ref 0.0–0.2)

## 2020-11-14 LAB — COMPREHENSIVE METABOLIC PANEL
ALT: 14 U/L (ref 0–44)
AST: 16 U/L (ref 15–41)
Albumin: 3.5 g/dL (ref 3.5–5.0)
Alkaline Phosphatase: 38 U/L (ref 38–126)
Anion gap: 5 (ref 5–15)
BUN: 11 mg/dL (ref 6–20)
CO2: 19 mmol/L — ABNORMAL LOW (ref 22–32)
Calcium: 8.5 mg/dL — ABNORMAL LOW (ref 8.9–10.3)
Chloride: 111 mmol/L (ref 98–111)
Creatinine, Ser: 0.78 mg/dL (ref 0.44–1.00)
GFR, Estimated: >60 mL/min (ref >60)
Glucose, Bld: 91 mg/dL (ref 70–99)
Potassium: 3.4 mmol/L — ABNORMAL LOW (ref 3.5–5.1)
Sodium: 135 mmol/L (ref 135–145)
Total Bilirubin: 0.6 mg/dL (ref 0.3–1.2)
Total Protein: 6.3 g/dL — ABNORMAL LOW (ref 6.5–8.1)

## 2020-11-14 LAB — URINALYSIS, ROUTINE W REFLEX MICROSCOPIC
Bacteria, UA: NONE SEEN
Bilirubin Urine: NEGATIVE
Glucose, UA: NEGATIVE mg/dL
Ketones, ur: NEGATIVE mg/dL
Leukocytes,Ua: NEGATIVE
Nitrite: NEGATIVE
Protein, ur: NEGATIVE mg/dL
RBC / HPF: >50 RBC/hpf — ABNORMAL HIGH (ref 0–5)
Specific Gravity, Urine: 1.012 (ref 1.005–1.030)
pH: 7 (ref 5.0–8.0)

## 2020-11-14 LAB — LIPASE, BLOOD: Lipase: 29 U/L (ref 11–51)

## 2020-11-14 LAB — I-STAT BETA HCG BLOOD, ED (MC, WL, AP ONLY): I-stat hCG, quantitative: <5 m[IU]/mL (ref <5)

## 2020-11-14 LAB — RESP PANEL BY RT-PCR (FLU A&B, COVID) ARPGX2
Influenza A by PCR: NEGATIVE
Influenza B by PCR: NEGATIVE
SARS Coronavirus 2 by RT PCR: POSITIVE — AB

## 2020-11-14 MED ORDER — MOLNUPIRAVIR EUA 200MG CAPSULE: 4.0000 | ORAL_CAPSULE | Freq: Two times a day (BID) | ORAL | 0 refills | Status: AC

## 2020-11-14 MED ORDER — SODIUM CHLORIDE 0.9 % IV SOLN: 1.0000 g | Freq: Once | INTRAVENOUS | Status: DC

## 2020-11-14 MED ORDER — SODIUM CHLORIDE 0.9 % IV BOLUS
1000.0000 mL | Freq: Once | INTRAVENOUS | Status: AC
  Administered 2020-11-14: 1000 mL via INTRAVENOUS

## 2020-11-14 MED ORDER — CEPHALEXIN 500 MG PO CAPS: 500.0000 mg | ORAL_CAPSULE | Freq: Four times a day (QID) | ORAL | 0 refills | Status: AC

## 2020-11-14 MED ORDER — SODIUM CHLORIDE 0.9 % IV SOLN
2.0000 g | INTRAVENOUS | Status: DC
  Administered 2020-11-14: 2 g via INTRAVENOUS
  Filled 2020-11-14: qty 20

## 2020-11-14 MED ORDER — TAMSULOSIN HCL 0.4 MG PO CAPS: 0.4000 mg | ORAL_CAPSULE | Freq: Every day | ORAL | 0 refills | Status: AC

## 2020-11-14 MED ORDER — ONDANSETRON HCL 4 MG/2ML IJ SOLN
4.0000 mg | Freq: Once | INTRAMUSCULAR | Status: AC
  Administered 2020-11-14: 4 mg via INTRAVENOUS
  Filled 2020-11-14: qty 2

## 2020-11-14 MED ORDER — OXYCODONE-ACETAMINOPHEN 5-325 MG PO TABS: 1.0000 | ORAL_TABLET | Freq: Four times a day (QID) | ORAL | 0 refills | Status: AC | PRN

## 2020-11-14 MED ORDER — HYDROMORPHONE HCL 1 MG/ML IJ SOLN
1.0000 mg | Freq: Once | INTRAMUSCULAR | Status: AC
  Administered 2020-11-14: 1 mg via INTRAVENOUS
  Filled 2020-11-14: qty 1

## 2020-11-14 MED ORDER — MORPHINE SULFATE (PF) 4 MG/ML IV SOLN
4.0000 mg | Freq: Once | INTRAVENOUS | Status: AC
  Administered 2020-11-14: 4 mg via INTRAVENOUS
  Filled 2020-11-14: qty 1

## 2020-11-14 NOTE — ED Triage Notes (Signed)
Pt reports right flank pain for about a week and a half. Pt reports having CT scan on Wednesday that did not show anything. Pt developed a fever of 101 last night. Pt has hx of kidney stones.

## 2020-11-14 NOTE — Discharge Instructions (Addendum)
Flank painYou came to the emergency department today to be evaluated for your  flank pain.  The CT scan showed that you have an 8 mm kidney stone to your right ureter.  Due to concern for an infection we have started you on Keflex.  Please take this medication 4 times daily for the next 10 days.  Have also given you prescription for Percocet to help manage her pain and Flomax to help with urination.  Please follow-up with your urologist as soon as possible.  Today you received medications that may make you sleepy or impair your ability to make decisions.  For the next 24 hours please do not drive, operate heavy machinery, care for a small child with out another adult present, or perform any activities that may cause harm to you or someone else if you were to fall asleep or be impaired.   You are being prescribed a medication which may make you sleepy. Please follow up of listed precautions for at least 24 hours after taking one dose.   You tested positive for COVID-19. Please isolate at home for at least 7 days after the day your symptoms initially began, and THEN at least 24 hours after you are fever-free without the help of medications (Tylenol/acetaminophen and Advil/ibuprofen/Motrin) AND your symptoms are improving.  You can alternate Tylenol/acetaminophen and Advil/ibuprofen/Motrin every 4 hours for sore throat, body aches, headache or fever.  Drink plenty of water.  Use saline nasal spray for congestion. Wash your hands frequently. Please rest as needed with frequent repositioning and ambulation as tolerated.    If you use a CPAP or BiPAP device for management of obstructive sleep apnea may continue to use it however use it when isolated from other individuals to avoid spread of COVID-19.   If you use a nebulizer administer medication such as albuterol you may continue to use it however only one isolated from other individuals to avoid the spread of COVID-19.  If your symptoms do not improve  please follow-up with your primary care provider or urgent care.  Return to the ER for significant shortness of breath, uncontrollable vomiting, severe chest pain, inability to tolerate fluids, changes in mental status such as confusion or other concerning symptoms.  Get help right away if: You have a fever or chills. You develop severe pain. You develop new abdominal pain. You faint. You are unable to urinate.

## 2020-11-14 NOTE — ED Provider Notes (Signed)
[facility]-EMERGENCY DEPT Provider Note   CSN: [id number] Arrival date & time: 11/14/20  1402     History Chief Complaint  Patient presents with   Flank Pain   Fever    Shasha Buchbinder is a 45 y.o. female with a history of previous ureteral stones, migraine, anxiety, depression.  Patient presents emergency department with a chief complaint of right flank pain.  Flank pain has been present for the last week and a half.  Pain has been intermittent over this time.  At present patient rates pain 5/10 on the pain scale.  Pain radiates to right lower quadrant.  Pain is worse with movement and at nighttime.  Reports improvement with pain when taking Toradol or Tylenol.  Patient endorses associated nausea.  Patient has improving nausea when taking at home Phenergan.  Patient last took Tylenol Toradol and Phenergan at 12 PM today.  Patient endorses fever.  Started having fevers last night.  T-max 101 F orally this morning.  Patient also endorses urinary frequency.  Patient denies any hematuria, dysuria, constipation, diarrhea, blood in stool, melena, abdominal distention, vaginal bleeding, vaginal pain, vaginal discharge, pelvic pain.   Patient saw her urologist Dr. Mena Goes last week, CT scan performed on Wednesday showed no ureteral stones.  Patient has been taking Toradol and   Flank Pain Pertinent negatives include no chest pain, no abdominal pain, no headaches and no shortness of breath.  Fever Associated symptoms: nausea   Associated symptoms: no chest pain, no chills, no confusion, no diarrhea, no dysuria, no headaches, no rash and no vomiting       Past Medical History:  Diagnosis Date   Anemia    pt. denies   Anxiety    Depression    Eczema    H/O varicella    Hematuria    History of herpes genitalis    History of kidney stones    pt has long hx calcium phosphate stones   Hypocalcemia    Migraine    Pneumonia    once 10+ yeaers ago   Polycystic  ovarian syndrome    Renal tubular acidosis type I    Right ureteral stone    Seasonal allergies    Urgency of urination     Patient Active Problem List   Diagnosis Date Noted   Anxiety 10/06/2020   Obesity 10/06/2020   Ureteral calculus 12/12/2019   Renal tubular acidosis type I 06/24/2019   Hydronephrosis concurrent with and due to calculi of kidney and ureter 05/08/2019   Migraine without aura and without status migrainosus, not intractable 11/19/2013   Other malaise and fatigue 03/14/2013   Hypokalemia 12/26/2012   Infection of urinary tract 07/02/2012   Ureteral calculus, left 07/01/2012   Fever, unspecified 07/01/2012    Past Surgical History:  Procedure Laterality Date   CYSTOSCOPY W/ RETROGRADES Right 12/12/2019   Procedure: CYSTOSCOPY WITH RIGHT RETROGRADE PYELOGRAM AND STENT PLACEMENT;  Surgeon: Heloise Purpura, MD;  Location: [facility];  Service: Urology;  Laterality: Right;   CYSTOSCOPY W/ URETERAL STENT PLACEMENT Left 07/01/2012   Procedure: CYSTOSCOPY WITH RETROGRADE PYELOGRAM/URETERAL STENT PLACEMENT;  Surgeon: Milford Cage, MD;  Location: [facility];  Service: Urology;  Laterality: Left;   CYSTOSCOPY W/ URETERAL STENT PLACEMENT Right 07/27/2016   Procedure: CYSTOSCOPY WITH RETROGRADE PYELOGRAM/URETERAL STENT PLACEMENT;  Surgeon: Alfredo Martinez, MD;  Location: [facility];  Service: Urology;  Laterality: Right;   CYSTOSCOPY W/ URETERAL STENT PLACEMENT Right 05/08/2019   Procedure: CYSTOSCOPY WITH RETROGRADE PYELOGRAM/URETERAL  STENT PLACEMENT;  Surgeon: Malen Gauze, MD;  Location: [facility];  Service: Urology;  Laterality: Right;   CYSTOSCOPY WITH RETROGRADE PYELOGRAM, URETEROSCOPY AND STENT PLACEMENT Left 07/15/2012   Procedure: CYSTOSCOPY WITH left RETROGRADE PYELOGRAM,left  URETEROSCOPY AND left STENT exchange ;  Surgeon: Milford Cage, MD;  Location: [facility];  Service: Urology;  Laterality: Left;  LEFT URETER STENT EXCHANGE     CYSTOSCOPY  WITH RETROGRADE PYELOGRAM, URETEROSCOPY AND STENT PLACEMENT Right 08/11/2016   Procedure: CYSTOSCOPY WITH RIGHT RETROGRADE PYELOGRAM,RIGHT  URETEROSCOPY AND STENT CHANGE;  Surgeon: Ihor Gully, MD;  Location: [facility];  Service: Urology;  Laterality: Right;   CYSTOSCOPY/RETROGRADE/URETEROSCOPY/STONE EXTRACTION WITH BASKET  AGE 64   CYSTOSCOPY/URETEROSCOPY/HOLMIUM LASER/STENT PLACEMENT Bilateral 09/19/2018   Procedure: CYSTOSCOPY/RETROGRADE/URETEROSCOPY/HOLMIUM LASER/STENT PLACEMENT;  Surgeon: [name], MD;  Location: [facility];  Service: Urology;  Laterality: Bilateral;   CYSTOSCOPY/URETEROSCOPY/HOLMIUM LASER/STENT PLACEMENT Right 10/22/2018   Procedure: CYSTOSCOPY/RETROGRADE/URETEROSCOPY/HOLMIUM LASER/STENT PLACEMENT;  Surgeon: [name], MD;  Location: [facility];  Service: Urology;  Laterality: Right;  ONLY NEEDS 60 MIN   CYSTOSCOPY/URETEROSCOPY/HOLMIUM LASER/STENT PLACEMENT Right 02/11/2019   Procedure: CYSTOSCOPY/RETROGRADE/URETEROSCOPY/HOLMIUM LASER/STENT PLACEMENT;  Surgeon: [name], MD;  Location: [facility];  Service: Urology;  Laterality: Right;   CYSTOSCOPY/URETEROSCOPY/HOLMIUM LASER/STENT PLACEMENT Bilateral 06/03/2019   Procedure: CYSTOSCOPY/RETROGRADE/URETEROSCOPY/HOLMIUM LASER/STENT PLACEMENT;  Surgeon: [name], MD;  Location: [facility];  Service: Urology;  Laterality: Bilateral;   CYSTOSCOPY/URETEROSCOPY/HOLMIUM LASER/STENT PLACEMENT Right 12/30/2019   Procedure: CYSTOSCOPY, RETROGRADE /URETEROSCOPY/HOLMIUM LASER/STENT EXCHANGE;  Surgeon: [name], MD;  Location: [facility];  Service: Urology;  Laterality: Right;   EXTRACORPOREAL SHOCK WAVE LITHOTRIPSY Left 08/07/2019   Procedure: EXTRACORPOREAL SHOCK WAVE LITHOTRIPSY (ESWL);  Surgeon: Noel Christmas, MD;  Location: [facility];  Service: Urology;  Laterality: Left;   EXTRACORPOREAL SHOCK WAVE  LITHOTRIPSY Left 04/08/2020   Procedure: EXTRACORPOREAL SHOCK WAVE LITHOTRIPSY (ESWL);  Surgeon: Belva Agee, MD;  Location: [facility];  Service: Urology;  Laterality: Left;   HOLMIUM LASER APPLICATION Left 07/15/2012   Procedure: HOLMIUM LASER APPLICATION;  Surgeon: Milford Cage, MD;  Location: [facility];  Service: Urology;  Laterality: Left;   HOLMIUM LASER APPLICATION Right 08/11/2016   Procedure: HOLMIUM LASER APPLICATION;  Surgeon: Ihor Gully, MD;  Location: [facility];  Service: Urology;  Laterality: Right;   HOLMIUM LASER APPLICATION Right 02/11/2019   Procedure: HOLMIUM LASER APPLICATION;  Surgeon: [name], MD;  Location: [facility];  Service: Urology;  Laterality: Right;   TONSILLECTOMY  AS CHILD     OB History     Gravida  2   Para  2   Term  2   Preterm      AB      Living  2      SAB      IAB      Ectopic      Multiple      Live Births  2           Family History  Problem Relation Age of Onset   Thyroid disease Mother    Depression Mother    Cancer Father        leaukemia   Thyroid disease Brother    Diabetes Maternal Aunt    Cancer Maternal Grandmother        GI   Diabetes Maternal Grandfather    Heart disease Paternal Grandmother     Social History  Tobacco Use   Smoking status: Never   Smokeless tobacco: Never  Vaping Use   Vaping Use: Never used  Substance Use Topics   Alcohol use: Yes    Comment: occasional   Drug use: No    Home Medications Prior to Admission medications   Medication Sig Start Date End Date Taking? Authorizing Provider  acetaminophen (TYLENOL) 500 MG tablet Take 1,000 mg by mouth every 6 (six) hours as needed for moderate pain.    [provider]  cephALEXin (KEFLEX) 500 MG capsule Take 500 mg by mouth 2 (two) times daily. Patient not taking: Reported on 10/06/2020    [provider]  diclofenac  (VOLTAREN) 75 MG EC tablet Take 1 tablet (75 mg total) by mouth 2 (two) times daily. 10/06/20   Lenn Sinkegal, Norman S, DPM  fluconazole (DIFLUCAN) 150 MG tablet fluconazole 150 mg tablet  TAKE FIRST TABLET TODAY FOLLOWED BY SECOND TABLET IN 3 DAYS = FOR YEAST    [provider]  levonorgestrel (MIRENA) 20 MCG/24HR IUD 1 each by Intrauterine route once. February 2014    [provider]  ondansetron (ZOFRAN) 4 MG tablet Take 4 mg by mouth every 8 (eight) hours as needed for nausea or vomiting.    [provider]  oxyCODONE-acetaminophen (PERCOCET) 5-325 MG tablet Take 1 tablet by mouth every 4 (four) hours as needed for severe pain. Patient not taking: Reported on 10/06/2020 04/08/20 04/08/21  Belva AgeeNewsome, George B, MD  potassium citrate (UROCIT-K) 10 MEQ (1080 MG) SR tablet potassium citrate ER 10 mEq (1,080 mg) tablet,extended release  TAKE 1 TABLET (10 MEQ TOTAL) BY MOUTH TWO (2) TIMES A DAY.    [provider]  tamsulosin (FLOMAX) 0.4 MG CAPS capsule tamsulosin 0.4 mg capsule  TAKE 1 CAPSULE BY MOUTH EVERY DAY    [provider]    Allergies    Adhesive [tape]  Review of Systems   Review of Systems  Constitutional:  Positive for fever. Negative for chills.  Eyes:  Negative for visual disturbance.  Respiratory:  Negative for shortness of breath.   Cardiovascular:  Negative for chest pain.  Gastrointestinal:  Positive for nausea. Negative for abdominal distention, abdominal pain, anal bleeding, blood in stool, constipation, diarrhea, rectal pain and vomiting.  Genitourinary:  Positive for flank pain and frequency. Negative for difficulty urinating, dysuria, hematuria, pelvic pain, vaginal bleeding, vaginal discharge and vaginal pain.  Musculoskeletal:  Negative for back pain and neck pain.  Skin:  Negative for color change and rash.  Neurological:  Negative for dizziness, syncope, light-headedness and headaches.  Psychiatric/Behavioral:  Negative for confusion.     Physical Exam Updated Vital Signs BP (!) 161/87 (BP Location: Left Arm)   Pulse 98   Temp 99.6 F (37.6 C) (Oral)   Resp 18   Ht 5\' 2"  (1.575 m)   Wt 83.9 kg   SpO2 96%   BMI 33.84 kg/m   Physical Exam Vitals and nursing note reviewed.  Constitutional:      General: She is not in acute distress.    Appearance: She is obese. She is not ill-appearing, toxic-appearing or diaphoretic.  HENT:     Head: Normocephalic.  Eyes:     General: No scleral icterus.       Right eye: No discharge.        Left eye: No discharge.  Cardiovascular:     Rate and Rhythm: Normal rate.  Pulmonary:     Effort: Pulmonary effort is normal.  Abdominal:  General: Bowel sounds are normal. There is no distension. There are no signs of injury.     Palpations: Abdomen is soft. There is no mass.     Tenderness: There is abdominal tenderness in the right lower quadrant. There is right CVA tenderness. There is no left CVA tenderness, guarding or rebound. Negative signs include McBurney's sign.     Hernia: There is no hernia in the umbilical area or ventral area.     Comments: To right flank, is to right lower quadrant.  No tenderness over McBurney's point.  Skin:    General: Skin is warm and dry.     Coloration: Skin is not jaundiced or pale.  Neurological:     General: No focal deficit present.     Mental Status: She is alert.  Psychiatric:        Behavior: Behavior is cooperative.    ED Results / Procedures / Treatments   Labs (all labs ordered are listed, but only abnormal results are displayed) Labs Reviewed  RESP PANEL BY RT-PCR (FLU A&B, COVID) ARPGX2 - Abnormal; Notable for the following components:      Result Value   SARS Coronavirus 2 by RT PCR POSITIVE (*)    All other components within normal limits  CBC WITH DIFFERENTIAL/PLATELET - Abnormal; Notable for the following components:   WBC 3.4 (*)    Hemoglobin 9.2 (*)    HCT 30.6 (*)    MCV 70.0 (*)    MCH 21.1 (*)    RDW 17.2  (*)    Platelets 128 (*)    Lymphs Abs 0.3 (*)    All other components within normal limits  COMPREHENSIVE METABOLIC PANEL - Abnormal; Notable for the following components:   Potassium 3.4 (*)    CO2 19 (*)    Calcium 8.5 (*)    Total Protein 6.3 (*)    All other components within normal limits  URINALYSIS, ROUTINE W REFLEX MICROSCOPIC - Abnormal; Notable for the following components:   Color, Urine STRAW (*)    APPearance HAZY (*)    Hgb urine dipstick MODERATE (*)    RBC / HPF >50 (*)    All other components within normal limits  URINE CULTURE  LIPASE, BLOOD  I-STAT BETA HCG BLOOD, ED ([facility], [facility], [facility] ONLY)    EKG None  Radiology CT Renal Stone Study  Result Date: 11/14/2020 CLINICAL DATA:  45 year old female with acute RIGHT flank and abdominal pain. EXAM: CT ABDOMEN AND PELVIS WITHOUT CONTRAST TECHNIQUE: Multidetector CT imaging of the abdomen and pelvis was performed following the standard protocol without IV contrast. COMPARISON:  11/10/2020 CT and prior studies FINDINGS: Please note that parenchymal abnormalities may be missed without intravenous contrast. Lower chest: No acute abnormality Hepatobiliary: The liver and gallbladder are unremarkable. No biliary dilatation. Pancreas: Unremarkable Spleen: Unremarkable Adrenals/Urinary Tract: A 7 mm mid-distal RIGHT ureteral calculus causes moderate to severe RIGHT hydroureteronephrosis. Multiple nonobstructing bilateral renal calculi are also again identified. The adrenal glands and bladder are unremarkable. Stomach/Bowel: Stomach is within normal limits. Appendix appears normal. No evidence of bowel wall thickening, distention, or inflammatory changes. Vascular/Lymphatic: No significant vascular findings are present. No enlarged abdominal or pelvic lymph nodes. Reproductive: Uterus and bilateral adnexa are unremarkable except for an IUD. Other: No ascites, focal collection or pneumoperitoneum. Musculoskeletal: No acute or suspicious bony  abnormalities identified. IMPRESSION: 1. 7 mm mid-distal RIGHT ureteral calculus causing moderate to severe RIGHT hydroureteronephrosis. 2. Multiple nonobstructing bilateral renal calculi. Electronically Signed  By: Harmon Pier M.D.   On: 11/14/2020 15:28    Procedures Procedures   Medications Ordered in ED Medications - No data to display  ED Course  I have reviewed the triage vital signs and the nursing notes.  Pertinent labs & imaging results that were available during my care of the patient were reviewed by me and considered in my medical decision making (see chart for details).  Clinical Course as of 11/14/20 2213  Sun Nov 14, 2020  1546 Hemoglobin(!): 9.2 [DR]    Clinical Course User Index [DR] Margarita Grizzle, MD   MDM Rules/Calculators/A&P                          Alert 45 year old female no acute distress, nontoxic-appearing.  Patient presents emergency department with a chief complaint of right flank pain.  Patient has a history of ureteral stones.  Patient is endorsing temperature of 101 F orally this morning.  Did have temperature of 99.6 in emergency department.  Will obtain abdominal pain labs, prostate test, CT renal stone study.  Given 1 L fluid bolus and plan for pain management.  CBC shows leukopenia 3.4, anemia with hemoglobin 9.2 and hematocrit 30.6. CMP shows potassium of 3.4 Urinalysis shows bacteria 19, WBC 0-5, RBC greater than 50, leukocyte negative, nitrate.  Renal study shows 7 mm mid-distal RIGHT ureteral calculus causing moderate to severe RIGHT hydroureteronephrosis. Multiple nonobstructing bilateral renal calculi.  Due to concern for infected renal swelling we will start patient on ceftriaxone.  We will consult on-call urologist.  Spoke to Dr. Arita Miss recommends close outpatient follow-up with urology.  Respiratory panel positive for COVID-19.  Suspect this as cause of patient's fever.  Will prescribe patient with molnupiravir due to obesity.  Will  also prescribe patient with 10-day course of Keflex to cover for possible pyelonephritis.  Patient will be prescribed Flomax and short course of Percocet due to her renal stone.  Patient given strict return precautions.  Patient expressed understanding of all instructions and is agreeable with this plan.  Tristan Bramble was evaluated in Emergency Department on 11/14/2020 for the symptoms described in the history of present illness. She was evaluated in the context of the global COVID-19 pandemic, which necessitated consideration that the patient might be at risk for infection with the SARS-CoV-2 virus that causes COVID-19. Institutional protocols and algorithms that pertain to the evaluation of patients at risk for COVID-19 are in a state of rapid change based on information released by regulatory bodies including the CDC and federal and state organizations. These policies and algorithms were followed during the patient's care in the ED.   Final Clinical Impression(s) / ED Diagnoses Final diagnoses:  COVID-19  Ureteral calculus    Rx / DC Orders ED Discharge Orders          Ordered    molnupiravir EUA 200 mg CAPS  2 times daily        11/14/20 1803    tamsulosin (FLOMAX) 0.4 MG CAPS capsule  Daily        11/14/20 1803    oxyCODONE-acetaminophen (PERCOCET/ROXICET) 5-325 MG tablet  Every 6 hours PRN        11/14/20 1803    cephALEXin (KEFLEX) 500 MG capsule  4 times daily        11/14/20 1803             Berneice Heinrich 11/14/20 2219    Margarita Grizzle, MD 11/16/20  1606  

## 2020-11-15 LAB — URINE CULTURE: Culture: NO GROWTH

## 2020-12-07 ENCOUNTER — Other Ambulatory Visit: Payer: Self-pay | Admitting: Urology

## 2020-12-07 DIAGNOSIS — N201 Calculus of ureter: Secondary | ICD-10-CM

## 2020-12-13 NOTE — Progress Notes (Signed)
Left message for patient to return call for ESWL instructions. 

## 2020-12-14 NOTE — Progress Notes (Signed)
Patient to arrive at 0800 on 12/16/2020. History and medications reviewed. Pre-procedure instructions given. NPO after MN on Wednesday except for clear liquids until 0600. Driver secured.

## 2020-12-15 NOTE — H&P (Signed)
1) Long history of calcium phosphate stones.   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 Prior serum evaluation with PTH, calcium, uric acid and electrolytes was normal. Tried: HCTZ 25mg - induced symptomatic hypokalemia despite being on Kcitrate. Also, Kcit 20 meq BID caused GI upset. Repeat labs were done which included normal serum labs. 24-hour urine revealed very low volume, extreme hypocitraturia. Her urine Ca was normal. Urine pH 6.6 - 7.2. She saw [facility] and they are working with dietician. Needs another 24 hr urine. Seen Oct 2021. She is trying to drink more water and is taking K citrate (one morning and one evening) but she is only taking one per day and trying to "work up to it". Did another 24 hr urine 03/22 and meets with [facility].   Surgical -  Right URS Apr 2018 with Dr. May 2018. She underwent bilateral ureteroscopy in 01/2018 (right staged). She was taken 08/21 for cysto Right URS/HLL/stent. 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 She underwent left ESWL 12/21 for a 6-7 mm left prox stone. 1/22 12/21, benign with nephrocalcinosis noted bilaterally but no hydro.   She passed four or five stones - largest 6 - 7 mm. Not a lot of pain or hematuria. Much easier. Feeling better. 1/22 03/22, looks good - nephrocalcinosis. No hydro.   She had right flank pain 07/22 and CT here with nephrocalcinosis bilaterally with largest stone 7 mm on right. Pain worsened and scanned 4 days later 07/22 in ED and the right stone had passed into the right proximal ureter. I do think they are visible on the scout. She is drinking a lot of fluid and taking tamsulosin. She had "labor pains" when she voids. Pain manageable. Vitals stable.   Today, seen for the above.     ALLERGIES: No Allergies    MEDICATIONS: Ketorolac Tromethamine 10 mg tablet  Tamsulosin Hcl 0.4 mg capsule 1 capsule PO Daily  Advil  Aleve  Diclofenac Sodium 75 mg  tablet, delayed release  Ibuprofen  Potassium Citrate Er 10 meq (1,080 mg) tablet, extended release  Tylenol  Zofran 4 Mg     GU PSH: Cysto Remove Stent FB Sim - 2020 Cysto Uretero Lithotripsy, Right - 2020, 2014 Cystoscopy - 02/03/2020 Cystoscopy And Treatment, Right - 2019, 2008 Cystoscopy Insert Stent, Right - 2021, Right - 2019, Left - 2019, Right - 2018, 2014, 2014 ESWL, Left - 08/07/2019 Ureteroscopic laser litho, Left - 04/08/2020, Right - 12/30/2019, Bilateral - 2021, Right - 02/11/2019, Right - 2020, Bilateral - 2020, Left - 2019, Right - 2019, Right - 2018 Ureteroscopic stone removal, Right - 2018       PSH Notes: Cystoscopy With Ureteroscopy With Lithotripsy, Cystoscopy With Insertion Of Ureteral Stent Left, Cystoscopy With Insertion Of Ureteral Stent Left, Cystoscopy With Manipulation Of Ureteral Calculus   NON-GU PSH: None   GU PMH: Flank Pain - 11/10/2020, - 02/03/2019, - 01/17/2019, - 12/24/2018, - 2020 Renal calculus - 11/10/2020, She is well. Working with [facility] dietician/stone clinic. We hope she'll have a time now where she doesn't need any procedures. Plan [facility]UB and [facility] in 6 mo. , - 07/21/2020 (Stable), She in 3 months with renal 07/23/2020. , - 04/28/2020, - 04/15/2020, - 04/01/2020, Renal 14/06/2019 in 3 mo , - 02/03/2020, - 12/12/2019, KUB and 12/14/2019 look good. See in 6 mo with [place]. , - 11/19/2019, - 10/30/2019, - 10/01/2019, - 08/21/2019, - 08/05/2019, - 2021, - 03/04/2019, - 12/03/2018, - 2020, -  2020, - 2020, - 2020, - 2018, Nephrolithiasis, - 2015 Gross hematuria, This is likely upper tract or papilla bleeding from her last surgery. There is no hydro. If it persists would consider CT IVP or cysto/RGP/URS. - 02/03/2020, - 2020 Acute Cystitis/UTI - 2020 LLQ pain - 2020 RLQ pain - 2020 Mixed incontinence, Mixed stress and urge urinary incontinence - 2015 Urinary Tract Inf, Unspec site, Urinary tract infection - 2015, Pyuria, - 2015 Hydronephrosis Unspec, Hydronephrosis On The Right - 2014 Ureteral calculus,  Ureteral Stone - 2014    NON-GU PMH: Bacteriuria - 2018 Encounter for general adult medical examination without abnormal findings, Encounter for preventive health examination - 2015 Hypokalemia, Hypokalemia - 2015 Hypercalciuria, Hypercalciuria - 2014 Personal history of other diseases of the nervous system and sense organs, History of migraine headaches - 2014 Personal history of other specified conditions, History of nausea - 2014    FAMILY HISTORY: Family Health Status Number - Runs In Family Father Deceased At Age51 ___ - Runs In Family nephrolithiasis - Mother, Grandfather, Aunt, Uncle   SOCIAL HISTORY: Marital Status: Single Preferred Language: English; Ethnicity: Not Hispanic Or Latino; Race: White Current Smoking Status: Patient has never smoked.   Tobacco Use Assessment Completed: Used Tobacco in last 30 days? Does not use smokeless tobacco. Has never drank.  Does not use drugs. Drinks 3 caffeinated drinks per day. Has not had a blood transfusion. 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     Notes: Never A Smoker, Drug Use, Caffeine Use, Marital History - Single, Alcohol Use   REVIEW OF SYSTEMS:    GU Review Female:   Patient reports frequent urination and burning /pain with urination. Patient denies hard to postpone urination, get up at night to urinate, leakage of urine, stream starts and stops, trouble starting your stream, have to strain to urinate, and being pregnant.  Gastrointestinal (Upper):   Patient denies nausea, vomiting, and indigestion/ heartburn.  Gastrointestinal (Lower):   Patient denies diarrhea and constipation.  Constitutional:   Patient denies fatigue, night sweats, fever, and weight loss.  Skin:   Patient denies skin rash/ lesion and itching.  Eyes:   Patient denies blurred vision and double vision.  Ears/ Nose/ Throat:   Patient denies sore throat and sinus problems.  Hematologic/Lymphatic:   Patient denies swollen glands and easy bruising.   Cardiovascular:   Patient denies leg swelling and chest pains.  Respiratory:   Patient denies cough and shortness of breath.  Endocrine:   Patient denies excessive thirst.  Musculoskeletal:   right flank pain. Patient reports back pain. Patient denies joint pain.  Neurological:   Patient denies headaches and dizziness.  Psychologic:   Patient denies depression and anxiety.   VITAL SIGNS:      12/01/2020 01:35 PM  BP 132/74 mmHg  Pulse 76 /min  Temperature 98.4 F / 36.8 C   MULTI-SYSTEM PHYSICAL EXAMINATION:    Constitutional: Well-nourished. No physical deformities. Normally developed. Good grooming.  Neck: Neck symmetrical, not swollen. Normal tracheal position.  Respiratory: No labored breathing, no use of accessory muscles.   Cardiovascular: Normal temperature, normal extremity pulses, no swelling, no varicosities.  Neurologic / Psychiatric: Oriented to time, oriented to place, oriented to person. No depression, no anxiety, no agitation.  Gastrointestinal: No mass, no tenderness, no rigidity, non obese abdomen.     Complexity of Data:  Records Review:   Previous Hospital Records  X-Ray Review: Renal Ultrasound: Reviewed Films. 03/22 C.T. Abdomen/Pelvis: Reviewed Films. 07/22 x 2  PROCEDURES:          Urinalysis w/Scope Dipstick Dipstick Cont'd Micro  Color: Yellow Bilirubin: Neg mg/dL WBC/hpf: 10 - 46/EVO  Appearance: Cloudy Ketones: Neg mg/dL RBC/hpf: 0 - 2/hpf  Specific Gravity: 1.020 Blood: Neg ery/uL Bacteria: Mod (26-50/hpf)  pH: 6.5 Protein: Trace mg/dL Cystals: NS (Not Seen)  Glucose: Neg mg/dL Urobilinogen: 0.2 mg/dL Casts: NS (Not Seen)    Nitrites: Neg Trichomonas: Not Present    Leukocyte Esterase: 2+ leu/uL Mucous: Present      Epithelial Cells: 6 - 10/hpf      Yeast: NS (Not Seen)      Sperm: Not Present    ASSESSMENT:      ICD-10 Details  1 GU:   Renal calculus - N20.0 Chronic, Stable - stone burden continues to drastically improve on the CT.   2    Ureteral calculus - N20.1 Chronic, Stable - I discussed with the patient the nature risks and benefits of continued stone passage, off label use of alpha blockers, shockwave lithotripsy or ureteroscopy. All questions answered. She really hopes to avoid URS/stent and I set up for right ESWL.    PLAN:           Orders Labs Urine Culture          Schedule Return Visit/Planned Activity: Next Available Appointment - Schedule Surgery

## 2020-12-16 ENCOUNTER — Other Ambulatory Visit: Payer: Self-pay

## 2020-12-16 ENCOUNTER — Ambulatory Visit (HOSPITAL_BASED_OUTPATIENT_CLINIC_OR_DEPARTMENT_OTHER)
Admission: RE | Admit: 2020-12-16 | Discharge: 2020-12-16 | Disposition: A | Discharge status: Home / Self Care | Payer: BC Managed Care – PPO | Attending: Urology | Admitting: Urology

## 2020-12-16 ENCOUNTER — Encounter (HOSPITAL_BASED_OUTPATIENT_CLINIC_OR_DEPARTMENT_OTHER): Payer: Self-pay | Admitting: Urology

## 2020-12-16 ENCOUNTER — Ambulatory Visit (HOSPITAL_COMMUNITY): Payer: BC Managed Care – PPO

## 2020-12-16 ENCOUNTER — Encounter (HOSPITAL_BASED_OUTPATIENT_CLINIC_OR_DEPARTMENT_OTHER)
Admission: RE | Disposition: A | Discharge status: Home / Self Care | Payer: Self-pay | Source: Home / Self Care | Attending: Urology

## 2020-12-16 DIAGNOSIS — N201 Calculus of ureter: Secondary | ICD-10-CM | POA: Diagnosis present

## 2020-12-16 DIAGNOSIS — Z6836 Body mass index (BMI) 36.0-36.9, adult: Secondary | ICD-10-CM | POA: Insufficient documentation

## 2020-12-16 DIAGNOSIS — E669 Obesity, unspecified: Secondary | ICD-10-CM | POA: Insufficient documentation

## 2020-12-16 HISTORY — PX: EXTRACORPOREAL SHOCK WAVE LITHOTRIPSY: SHX1557

## 2020-12-16 LAB — POCT PREGNANCY, URINE: Preg Test, Ur: NEGATIVE

## 2020-12-16 SURGERY — LITHOTRIPSY, ESWL
Anesthesia: LOCAL | Laterality: Right

## 2020-12-16 MED ORDER — DIPHENHYDRAMINE HCL 25 MG PO CAPS
ORAL_CAPSULE | ORAL | Status: AC
  Filled 2020-12-16: qty 1

## 2020-12-16 MED ORDER — HYDROCODONE-ACETAMINOPHEN 5-325 MG PO TABS: 1.0000 | ORAL_TABLET | Freq: Four times a day (QID) | ORAL | 0 refills | Status: DC | PRN

## 2020-12-16 MED ORDER — CIPROFLOXACIN HCL 500 MG PO TABS
500.0000 mg | ORAL_TABLET | ORAL | Status: AC
  Administered 2020-12-16: 500 mg via ORAL

## 2020-12-16 MED ORDER — DIAZEPAM 5 MG PO TABS
10.0000 mg | ORAL_TABLET | ORAL | Status: AC
  Administered 2020-12-16: 10 mg via ORAL

## 2020-12-16 MED ORDER — CIPROFLOXACIN HCL 500 MG PO TABS
ORAL_TABLET | ORAL | Status: AC
  Filled 2020-12-16: qty 1

## 2020-12-16 MED ORDER — DIAZEPAM 5 MG PO TABS
ORAL_TABLET | ORAL | Status: AC
  Filled 2020-12-16: qty 2

## 2020-12-16 MED ORDER — DIPHENHYDRAMINE HCL 25 MG PO CAPS
25.0000 mg | ORAL_CAPSULE | ORAL | Status: AC
  Administered 2020-12-16: 25 mg via ORAL

## 2020-12-16 MED ORDER — SODIUM CHLORIDE 0.9 % IV SOLN: INTRAVENOUS | Status: DC

## 2020-12-16 NOTE — Discharge Instructions (Signed)
I have reviewed discharge instructions in detail with the patient. They will follow-up with me or their physician as scheduled. My nurse will also be calling the patients as per protocol.   

## 2020-12-16 NOTE — Interval H&P Note (Signed)
History and Physical Interval Note:  12/16/2020 8:47 AM  Phyllis House  has presented today for surgery, with the diagnosis of RIGHT URETERAL STONE.  The various methods of treatment have been discussed with the patient and family. After consideration of risks, benefits and other options for treatment, the patient has consented to  Procedure(s): EXTRACORPOREAL SHOCK WAVE LITHOTRIPSY (ESWL) (Right) as a surgical intervention.  The patient's history has been reviewed, patient examined, no change in status, stable for surgery.  I have reviewed the patient's chart and labs.  Questions were answered to the patient's satisfaction.     Rashaun Curl A Kraig Genis

## 2020-12-17 ENCOUNTER — Encounter (HOSPITAL_BASED_OUTPATIENT_CLINIC_OR_DEPARTMENT_OTHER): Payer: Self-pay | Admitting: Urology

## 2021-02-28 ENCOUNTER — Encounter: Payer: Self-pay | Admitting: Podiatry

## 2021-02-28 ENCOUNTER — Ambulatory Visit: Payer: BC Managed Care – PPO | Admitting: Podiatry

## 2021-02-28 ENCOUNTER — Other Ambulatory Visit: Payer: Self-pay

## 2021-02-28 ENCOUNTER — Ambulatory Visit (INDEPENDENT_AMBULATORY_CARE_PROVIDER_SITE_OTHER): Payer: BC Managed Care – PPO

## 2021-02-28 DIAGNOSIS — M7662 Achilles tendinitis, left leg: Secondary | ICD-10-CM | POA: Diagnosis not present

## 2021-02-28 MED ORDER — TRIAMCINOLONE ACETONIDE 10 MG/ML IJ SUSP
10.0000 mg | Freq: Once | INTRAMUSCULAR | Status: AC
  Administered 2021-02-28: 10 mg

## 2021-02-28 NOTE — Progress Notes (Signed)
Subjective:   Patient ID: Phyllis House, female   DOB: 45 y.o.   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  She wants to do this understanding risk and understand she may ultimately require surgery for her condition.  Sterile prep and carefully injected with 2 mg dexamethasone Kenalog 5 mg Xylocaine medial side and want her to wear her boot immediately at this time  X-rays indicate that there is a very large spur which is a good part of this problem very good chance long-term she is can require surgery

## 2021-03-05 ENCOUNTER — Other Ambulatory Visit: Payer: Self-pay

## 2021-03-05 ENCOUNTER — Observation Stay (HOSPITAL_COMMUNITY): Payer: BC Managed Care – PPO | Admitting: Certified Registered Nurse Anesthetist

## 2021-03-05 ENCOUNTER — Emergency Department (HOSPITAL_COMMUNITY): Payer: BC Managed Care – PPO

## 2021-03-05 ENCOUNTER — Encounter (HOSPITAL_COMMUNITY)
Admission: EM | Disposition: A | Discharge status: Home / Self Care | Payer: Self-pay | Source: Home / Self Care | Attending: Emergency Medicine

## 2021-03-05 ENCOUNTER — Observation Stay (HOSPITAL_COMMUNITY)
Admission: EM | Admit: 2021-03-05 | Discharge: 2021-03-06 | Disposition: A | Discharge status: Home / Self Care | Payer: BC Managed Care – PPO | Attending: Urology | Admitting: Urology

## 2021-03-05 ENCOUNTER — Encounter (HOSPITAL_COMMUNITY): Payer: Self-pay | Admitting: Oncology

## 2021-03-05 ENCOUNTER — Observation Stay (HOSPITAL_COMMUNITY): Payer: BC Managed Care – PPO

## 2021-03-05 ENCOUNTER — Emergency Department (HOSPITAL_COMMUNITY)
Admission: EM | Admit: 2021-03-05 | Discharge: 2021-03-05 | Disposition: A | Discharge status: Home / Self Care | Payer: BC Managed Care – PPO | Source: Home / Self Care | Attending: Emergency Medicine | Admitting: Emergency Medicine

## 2021-03-05 ENCOUNTER — Encounter (HOSPITAL_COMMUNITY): Payer: Self-pay

## 2021-03-05 DIAGNOSIS — B9689 Other specified bacterial agents as the cause of diseases classified elsewhere: Secondary | ICD-10-CM | POA: Insufficient documentation

## 2021-03-05 DIAGNOSIS — Z87442 Personal history of urinary calculi: Secondary | ICD-10-CM | POA: Diagnosis not present

## 2021-03-05 DIAGNOSIS — N132 Hydronephrosis with renal and ureteral calculous obstruction: Principal | ICD-10-CM | POA: Diagnosis present

## 2021-03-05 DIAGNOSIS — Z20822 Contact with and (suspected) exposure to covid-19: Secondary | ICD-10-CM | POA: Diagnosis not present

## 2021-03-05 DIAGNOSIS — N200 Calculus of kidney: Secondary | ICD-10-CM

## 2021-03-05 DIAGNOSIS — N23 Unspecified renal colic: Secondary | ICD-10-CM

## 2021-03-05 DIAGNOSIS — R509 Fever, unspecified: Secondary | ICD-10-CM

## 2021-03-05 DIAGNOSIS — R1031 Right lower quadrant pain: Secondary | ICD-10-CM | POA: Diagnosis present

## 2021-03-05 HISTORY — PX: CYSTOSCOPY W/ URETERAL STENT PLACEMENT: SHX1429

## 2021-03-05 LAB — BASIC METABOLIC PANEL
Anion gap: 8 (ref 5–15)
BUN: 17 mg/dL (ref 6–20)
CO2: 18 mmol/L — ABNORMAL LOW (ref 22–32)
Calcium: 8.6 mg/dL — ABNORMAL LOW (ref 8.9–10.3)
Chloride: 109 mmol/L (ref 98–111)
Creatinine, Ser: 1.05 mg/dL — ABNORMAL HIGH (ref 0.44–1.00)
GFR, Estimated: >60 mL/min (ref >60)
Glucose, Bld: 120 mg/dL — ABNORMAL HIGH (ref 70–99)
Potassium: 3.3 mmol/L — ABNORMAL LOW (ref 3.5–5.1)
Sodium: 135 mmol/L (ref 135–145)

## 2021-03-05 LAB — URINALYSIS, ROUTINE W REFLEX MICROSCOPIC
Bacteria, UA: NONE SEEN
Bilirubin Urine: NEGATIVE
Bilirubin Urine: NEGATIVE
Glucose, UA: NEGATIVE mg/dL
Glucose, UA: NEGATIVE mg/dL
Ketones, ur: NEGATIVE mg/dL
Ketones, ur: NEGATIVE mg/dL
Nitrite: NEGATIVE
Nitrite: NEGATIVE
Protein, ur: NEGATIVE mg/dL
Protein, ur: 30 mg/dL — AB
Specific Gravity, Urine: 1.018 (ref 1.005–1.030)
Specific Gravity, Urine: 1.006 (ref 1.005–1.030)
pH: 6 (ref 5.0–8.0)
pH: 8 (ref 5.0–8.0)

## 2021-03-05 LAB — COMPREHENSIVE METABOLIC PANEL
ALT: 14 U/L (ref 0–44)
AST: 16 U/L (ref 15–41)
Albumin: 3.8 g/dL (ref 3.5–5.0)
Alkaline Phosphatase: 46 U/L (ref 38–126)
Anion gap: 6 (ref 5–15)
BUN: 15 mg/dL (ref 6–20)
CO2: 16 mmol/L — ABNORMAL LOW (ref 22–32)
Calcium: 8.1 mg/dL — ABNORMAL LOW (ref 8.9–10.3)
Chloride: 109 mmol/L (ref 98–111)
Creatinine, Ser: 0.99 mg/dL (ref 0.44–1.00)
GFR, Estimated: >60 mL/min (ref >60)
Glucose, Bld: 120 mg/dL — ABNORMAL HIGH (ref 70–99)
Potassium: 3.1 mmol/L — ABNORMAL LOW (ref 3.5–5.1)
Sodium: 131 mmol/L — ABNORMAL LOW (ref 135–145)
Total Bilirubin: 0.9 mg/dL (ref 0.3–1.2)
Total Protein: 6.5 g/dL (ref 6.5–8.1)

## 2021-03-05 LAB — CBC WITH DIFFERENTIAL/PLATELET
Abs Immature Granulocytes: 0.03 10*3/uL (ref 0.00–0.07)
Basophils Absolute: 0 10*3/uL (ref 0.0–0.1)
Basophils Relative: 0 %
Eosinophils Absolute: 0 10*3/uL (ref 0.0–0.5)
Eosinophils Relative: 0 %
HCT: 31.1 % — ABNORMAL LOW (ref 36.0–46.0)
Hemoglobin: 9.4 g/dL — ABNORMAL LOW (ref 12.0–15.0)
Immature Granulocytes: 0 %
Lymphocytes Relative: 2 %
Lymphs Abs: 0.2 10*3/uL — ABNORMAL LOW (ref 0.7–4.0)
MCH: 21.1 pg — ABNORMAL LOW (ref 26.0–34.0)
MCHC: 30.2 g/dL (ref 30.0–36.0)
MCV: 69.9 fL — ABNORMAL LOW (ref 80.0–100.0)
Monocytes Absolute: 0.4 10*3/uL (ref 0.1–1.0)
Monocytes Relative: 5 %
Neutro Abs: 8.1 10*3/uL — ABNORMAL HIGH (ref 1.7–7.7)
Neutrophils Relative %: 93 %
Platelets: 165 10*3/uL (ref 150–400)
RBC: 4.45 MIL/uL (ref 3.87–5.11)
RDW: 17.1 % — ABNORMAL HIGH (ref 11.5–15.5)
WBC: 8.8 10*3/uL (ref 4.0–10.5)
nRBC: 0 % (ref 0.0–0.2)

## 2021-03-05 LAB — CBC
HCT: 31.9 % — ABNORMAL LOW (ref 36.0–46.0)
Hemoglobin: 9.7 g/dL — ABNORMAL LOW (ref 12.0–15.0)
MCH: 21.2 pg — ABNORMAL LOW (ref 26.0–34.0)
MCHC: 30.4 g/dL (ref 30.0–36.0)
MCV: 69.7 fL — ABNORMAL LOW (ref 80.0–100.0)
Platelets: 217 10*3/uL (ref 150–400)
RBC: 4.58 MIL/uL (ref 3.87–5.11)
RDW: 17.2 % — ABNORMAL HIGH (ref 11.5–15.5)
WBC: 5.9 10*3/uL (ref 4.0–10.5)
nRBC: 0 % (ref 0.0–0.2)

## 2021-03-05 LAB — I-STAT BETA HCG BLOOD, ED (MC, WL, AP ONLY): I-stat hCG, quantitative: <5 m[IU]/mL (ref <5)

## 2021-03-05 LAB — RESP PANEL BY RT-PCR (FLU A&B, COVID) ARPGX2
Influenza A by PCR: NEGATIVE
Influenza B by PCR: NEGATIVE
SARS Coronavirus 2 by RT PCR: NEGATIVE

## 2021-03-05 SURGERY — CYSTOSCOPY, WITH RETROGRADE PYELOGRAM AND URETERAL STENT INSERTION
Anesthesia: General | Laterality: Right

## 2021-03-05 MED ORDER — SODIUM CHLORIDE 0.9 % IV BOLUS
1000.0000 mL | Freq: Once | INTRAVENOUS | Status: AC
  Administered 2021-03-05: 1000 mL via INTRAVENOUS

## 2021-03-05 MED ORDER — IOHEXOL 300 MG/ML  SOLN
INTRAMUSCULAR | Status: DC | PRN
  Administered 2021-03-05: 10 mL via URETHRAL

## 2021-03-05 MED ORDER — TAMSULOSIN HCL 0.4 MG PO CAPS
0.4000 mg | ORAL_CAPSULE | Freq: Every day | ORAL | Status: DC
  Administered 2021-03-06: 0.4 mg via ORAL
  Filled 2021-03-05: qty 1

## 2021-03-05 MED ORDER — MORPHINE SULFATE (PF) 4 MG/ML IV SOLN
4.0000 mg | Freq: Once | INTRAVENOUS | Status: AC
  Administered 2021-03-05: 4 mg via INTRAVENOUS
  Filled 2021-03-05: qty 1

## 2021-03-05 MED ORDER — SCOPOLAMINE 1 MG/3DAYS TD PT72
MEDICATED_PATCH | TRANSDERMAL | Status: DC | PRN
  Administered 2021-03-05: 1 via TRANSDERMAL

## 2021-03-05 MED ORDER — TAMSULOSIN HCL 0.4 MG PO CAPS: 0.4000 mg | ORAL_CAPSULE | Freq: Every day | ORAL | 0 refills | Status: DC

## 2021-03-05 MED ORDER — OXYCODONE HCL 5 MG PO TABS
5.0000 mg | ORAL_TABLET | ORAL | Status: DC | PRN
  Administered 2021-03-06: 5 mg via ORAL
  Filled 2021-03-05: qty 1

## 2021-03-05 MED ORDER — POTASSIUM CHLORIDE 10 MEQ/100ML IV SOLN
10.0000 meq | Freq: Once | INTRAVENOUS | Status: AC
  Administered 2021-03-05: 10 meq via INTRAVENOUS
  Filled 2021-03-05: qty 100

## 2021-03-05 MED ORDER — DEXAMETHASONE SODIUM PHOSPHATE 10 MG/ML IJ SOLN
INTRAMUSCULAR | Status: AC
  Filled 2021-03-05: qty 1

## 2021-03-05 MED ORDER — ONDANSETRON 4 MG PO TBDP: ORAL_TABLET | ORAL | 0 refills | Status: DC

## 2021-03-05 MED ORDER — SODIUM CHLORIDE 0.9 % IV SOLN: INTRAVENOUS | Status: DC

## 2021-03-05 MED ORDER — PHENYLEPHRINE 40 MCG/ML (10ML) SYRINGE FOR IV PUSH (FOR BLOOD PRESSURE SUPPORT)
PREFILLED_SYRINGE | INTRAVENOUS | Status: DC | PRN
  Administered 2021-03-05 (×4): 80 ug via INTRAVENOUS

## 2021-03-05 MED ORDER — LIDOCAINE HCL (PF) 2 % IJ SOLN
INTRAMUSCULAR | Status: AC
  Filled 2021-03-05: qty 5

## 2021-03-05 MED ORDER — FENTANYL CITRATE PF 50 MCG/ML IJ SOSY: 25.0000 ug | PREFILLED_SYRINGE | INTRAMUSCULAR | Status: DC | PRN

## 2021-03-05 MED ORDER — LIDOCAINE 2% (20 MG/ML) 5 ML SYRINGE
INTRAMUSCULAR | Status: DC | PRN
  Administered 2021-03-05: 80 mg via INTRAVENOUS

## 2021-03-05 MED ORDER — MORPHINE SULFATE 15 MG PO TABS: 7.5000 mg | ORAL_TABLET | ORAL | 0 refills | Status: DC | PRN

## 2021-03-05 MED ORDER — DEXAMETHASONE SODIUM PHOSPHATE 10 MG/ML IJ SOLN
INTRAMUSCULAR | Status: DC | PRN
  Administered 2021-03-05: 8 mg via INTRAVENOUS

## 2021-03-05 MED ORDER — PROPOFOL 10 MG/ML IV BOLUS
INTRAVENOUS | Status: AC
  Filled 2021-03-05: qty 20

## 2021-03-05 MED ORDER — PROPOFOL 10 MG/ML IV BOLUS
INTRAVENOUS | Status: DC | PRN
  Administered 2021-03-05: 150 mg via INTRAVENOUS

## 2021-03-05 MED ORDER — SODIUM CHLORIDE 0.9 % IV SOLN
1.0000 g | Freq: Once | INTRAVENOUS | Status: AC
  Administered 2021-03-05: 1 g via INTRAVENOUS
  Filled 2021-03-05: qty 10

## 2021-03-05 MED ORDER — SODIUM CHLORIDE 0.9 % IV SOLN
1.0000 g | INTRAVENOUS | Status: DC
  Administered 2021-03-06: 1 g via INTRAVENOUS
  Filled 2021-03-05: qty 10

## 2021-03-05 MED ORDER — ONDANSETRON HCL 4 MG/2ML IJ SOLN
4.0000 mg | Freq: Once | INTRAMUSCULAR | Status: AC
  Administered 2021-03-05: 4 mg via INTRAVENOUS
  Filled 2021-03-05: qty 2

## 2021-03-05 MED ORDER — STERILE WATER FOR IRRIGATION IR SOLN
Status: DC | PRN
  Administered 2021-03-05: 3000 mL

## 2021-03-05 MED ORDER — MORPHINE SULFATE (PF) 4 MG/ML IV SOLN
8.0000 mg | Freq: Once | INTRAVENOUS | Status: AC
  Administered 2021-03-05: 8 mg via INTRAVENOUS
  Filled 2021-03-05: qty 2

## 2021-03-05 MED ORDER — FENTANYL CITRATE (PF) 100 MCG/2ML IJ SOLN
INTRAMUSCULAR | Status: AC
  Filled 2021-03-05: qty 2

## 2021-03-05 MED ORDER — ONDANSETRON HCL 4 MG/2ML IJ SOLN: 4.0000 mg | Freq: Four times a day (QID) | INTRAMUSCULAR | Status: DC | PRN

## 2021-03-05 MED ORDER — SODIUM CHLORIDE 0.9 % IV SOLN: Freq: Once | INTRAVENOUS | Status: AC

## 2021-03-05 MED ORDER — LACTATED RINGERS IV SOLN: INTRAVENOUS | Status: DC | PRN

## 2021-03-05 MED ORDER — ONDANSETRON HCL 4 MG/2ML IJ SOLN
INTRAMUSCULAR | Status: AC
  Filled 2021-03-05: qty 2

## 2021-03-05 MED ORDER — MIDAZOLAM HCL 2 MG/2ML IJ SOLN
INTRAMUSCULAR | Status: AC
  Filled 2021-03-05: qty 2

## 2021-03-05 MED ORDER — FENTANYL CITRATE (PF) 100 MCG/2ML IJ SOLN
INTRAMUSCULAR | Status: DC | PRN
  Administered 2021-03-05: 50 ug via INTRAVENOUS

## 2021-03-05 MED ORDER — ACETAMINOPHEN 500 MG PO TABS: 1000.0000 mg | ORAL_TABLET | Freq: Four times a day (QID) | ORAL | Status: DC | PRN

## 2021-03-05 MED ORDER — ONDANSETRON HCL 4 MG/2ML IJ SOLN
INTRAMUSCULAR | Status: DC | PRN
  Administered 2021-03-05: 4 mg via INTRAVENOUS

## 2021-03-05 MED ORDER — SCOPOLAMINE 1 MG/3DAYS TD PT72
MEDICATED_PATCH | TRANSDERMAL | Status: AC
  Filled 2021-03-05: qty 1

## 2021-03-05 MED ORDER — MIDAZOLAM HCL 5 MG/5ML IJ SOLN
INTRAMUSCULAR | Status: DC | PRN
  Administered 2021-03-05 (×2): 1 mg via INTRAVENOUS

## 2021-03-05 MED ORDER — ACETAMINOPHEN 500 MG PO TABS
1000.0000 mg | ORAL_TABLET | Freq: Once | ORAL | Status: AC
  Administered 2021-03-05: 1000 mg via ORAL
  Filled 2021-03-05: qty 2

## 2021-03-05 MED ORDER — KETOROLAC TROMETHAMINE 15 MG/ML IJ SOLN
15.0000 mg | Freq: Once | INTRAMUSCULAR | Status: AC
  Administered 2021-03-05: 15 mg via INTRAVENOUS
  Filled 2021-03-05: qty 1

## 2021-03-05 SURGICAL SUPPLY — 12 items
BAG URO CATCHER STRL LF (MISCELLANEOUS) ×2 IMPLANT
CATH URET 5FR 28IN OPEN ENDED (CATHETERS) ×1 IMPLANT
CATH URETL OPEN END 6FR 70 (CATHETERS) ×1 IMPLANT
CLOTH BEACON ORANGE TIMEOUT ST (SAFETY) ×2 IMPLANT
GLOVE SURG ENC TEXT LTX SZ8 (GLOVE) ×2 IMPLANT
GOWN STRL REUS W/TWL XL LVL3 (GOWN DISPOSABLE) ×4 IMPLANT
GUIDEWIRE STR DUAL SENSOR (WIRE) ×2 IMPLANT
MANIFOLD NEPTUNE II (INSTRUMENTS) ×2 IMPLANT
PACK CYSTO (CUSTOM PROCEDURE TRAY) ×2 IMPLANT
STENT URET 6FRX24 CONTOUR (STENTS) ×1 IMPLANT
TRAY FOLEY MTR SLVR 16FR STAT (SET/KITS/TRAYS/PACK) ×1 IMPLANT
TUBING CONNECTING 10 (TUBING) ×2 IMPLANT

## 2021-03-05 NOTE — ED Triage Notes (Signed)
Patient worked yesterday and had pain in her right flank area. Took 3 tylenol. Pain improved. Midnight pain hurt really bad, took excedrin, tylenol. 4am took more tylenol and threw up, pain is nauseating. Pain now moved to her right groin.

## 2021-03-05 NOTE — ED Triage Notes (Signed)
Pt c/o fever tmax 103.  Pt seen for right flank pain today.  OTC medications and new rx not effective.

## 2021-03-05 NOTE — ED Provider Notes (Signed)
[facility]-EMERGENCY DEPT Provider Note   CSN: [id number] Arrival date & time: 03/05/21  [id number]     History Chief Complaint  Patient presents with   Flank Pain   Abdominal Pain    Phyllis HarrisJennifer House is a 45 y.o. female.  45 yo F with a chief complaints of right flank pain nausea and vomiting.  The patient has had multiple kidney stones in the past and thinks this feels the same.  She denies fevers or chills.  She attempted to control the pain at home and unfortunately had worsening of her symptoms and so came in to be evaluated.  She has had kidney stones and required intervention in the past.  The history is provided by the patient.  Flank Pain This is a new problem. The current episode started 2 days ago. The problem occurs constantly. The problem has not changed since onset.Associated symptoms include abdominal pain. Pertinent negatives include no chest pain, no headaches and no shortness of breath. Nothing aggravates the symptoms. Nothing relieves the symptoms. She has tried nothing for the symptoms. The treatment provided no relief.  Abdominal Pain Associated symptoms: nausea and vomiting   Associated symptoms: no chest pain, no chills, no dysuria, no fever and no shortness of breath       Past Medical History:  Diagnosis Date   Anemia    pt. denies   Anxiety    Depression    Eczema    H/O varicella    Hematuria    History of herpes genitalis    History of kidney stones    pt has long hx calcium phosphate stones   Hypocalcemia    Migraine    Pneumonia    once 10+ yeaers ago   Polycystic ovarian syndrome    Renal tubular acidosis type I    Right ureteral stone    Seasonal allergies    Urgency of urination     Patient Active Problem List   Diagnosis Date Noted   Anxiety 10/06/2020   Obesity 10/06/2020   Ureteral calculus 12/12/2019   Renal tubular acidosis type I 06/24/2019   Hydronephrosis concurrent with and due to calculi of kidney and  ureter 05/08/2019   Migraine without aura and without status migrainosus, not intractable 11/19/2013   Other malaise and fatigue 03/14/2013   Hypokalemia 12/26/2012   Infection of urinary tract 07/02/2012   Ureteral calculus, left 07/01/2012   Fever, unspecified 07/01/2012    Past Surgical History:  Procedure Laterality Date   CYSTOSCOPY W/ RETROGRADES Right 12/12/2019   Procedure: CYSTOSCOPY WITH RIGHT RETROGRADE PYELOGRAM AND STENT PLACEMENT;  Surgeon: Heloise PurpuraBorden, Lester, MD;  Location: [facility];  Service: Urology;  Laterality: Right;   CYSTOSCOPY W/ URETERAL STENT PLACEMENT Left 07/01/2012   Procedure: CYSTOSCOPY WITH RETROGRADE PYELOGRAM/URETERAL STENT PLACEMENT;  Surgeon: Milford Cageaniel Young Woodruff, MD;  Location: [facility];  Service: Urology;  Laterality: Left;   CYSTOSCOPY W/ URETERAL STENT PLACEMENT Right 07/27/2016   Procedure: CYSTOSCOPY WITH RETROGRADE PYELOGRAM/URETERAL STENT PLACEMENT;  Surgeon: Alfredo MartinezScott MacDiarmid, MD;  Location: [facility];  Service: Urology;  Laterality: Right;   CYSTOSCOPY W/ URETERAL STENT PLACEMENT Right 05/08/2019   Procedure: CYSTOSCOPY WITH RETROGRADE PYELOGRAM/URETERAL STENT PLACEMENT;  Surgeon: Malen GauzeMcKenzie, Patrick L, MD;  Location: [facility];  Service: Urology;  Laterality: Right;   CYSTOSCOPY WITH RETROGRADE PYELOGRAM, URETEROSCOPY AND STENT PLACEMENT Left 07/15/2012   Procedure: CYSTOSCOPY WITH left RETROGRADE PYELOGRAM,left  URETEROSCOPY AND left STENT exchange ;  Surgeon: Milford Cageaniel Young Woodruff, MD;  Location: [facility];  Service: Urology;  Laterality: Left;  LEFT URETER STENT EXCHANGE     CYSTOSCOPY WITH RETROGRADE PYELOGRAM, URETEROSCOPY AND STENT PLACEMENT Right 08/11/2016   Procedure: CYSTOSCOPY WITH RIGHT RETROGRADE PYELOGRAM,RIGHT  URETEROSCOPY AND STENT CHANGE;  Surgeon: Ihor Gully, MD;  Location: [facility];  Service: Urology;  Laterality: Right;   CYSTOSCOPY/RETROGRADE/URETEROSCOPY/STONE EXTRACTION WITH BASKET  AGE 89    CYSTOSCOPY/URETEROSCOPY/HOLMIUM LASER/STENT PLACEMENT Bilateral 09/19/2018   Procedure: CYSTOSCOPY/RETROGRADE/URETEROSCOPY/HOLMIUM LASER/STENT PLACEMENT;  Surgeon: [name], MD;  Location: [facility];  Service: Urology;  Laterality: Bilateral;   CYSTOSCOPY/URETEROSCOPY/HOLMIUM LASER/STENT PLACEMENT Right 10/22/2018   Procedure: CYSTOSCOPY/RETROGRADE/URETEROSCOPY/HOLMIUM LASER/STENT PLACEMENT;  Surgeon: [name], MD;  Location: [facility];  Service: Urology;  Laterality: Right;  ONLY NEEDS 60 MIN   CYSTOSCOPY/URETEROSCOPY/HOLMIUM LASER/STENT PLACEMENT Right 02/11/2019   Procedure: CYSTOSCOPY/RETROGRADE/URETEROSCOPY/HOLMIUM LASER/STENT PLACEMENT;  Surgeon: [name], MD;  Location: [facility];  Service: Urology;  Laterality: Right;   CYSTOSCOPY/URETEROSCOPY/HOLMIUM LASER/STENT PLACEMENT Bilateral 06/03/2019   Procedure: CYSTOSCOPY/RETROGRADE/URETEROSCOPY/HOLMIUM LASER/STENT PLACEMENT;  Surgeon: [name], MD;  Location: [facility];  Service: Urology;  Laterality: Bilateral;   CYSTOSCOPY/URETEROSCOPY/HOLMIUM LASER/STENT PLACEMENT Right 12/30/2019   Procedure: CYSTOSCOPY, RETROGRADE /URETEROSCOPY/HOLMIUM LASER/STENT EXCHANGE;  Surgeon: [name], MD;  Location: [facility];  Service: Urology;  Laterality: Right;   EXTRACORPOREAL SHOCK WAVE LITHOTRIPSY Left 08/07/2019   Procedure: EXTRACORPOREAL SHOCK WAVE LITHOTRIPSY (ESWL);  Surgeon: Noel Christmas, MD;  Location: [facility];  Service: Urology;  Laterality: Left;   EXTRACORPOREAL SHOCK WAVE LITHOTRIPSY Left 04/08/2020   Procedure: EXTRACORPOREAL SHOCK WAVE LITHOTRIPSY (ESWL);  Surgeon: Belva Agee, MD;  Location: [facility];  Service: Urology;  Laterality: Left;   EXTRACORPOREAL SHOCK WAVE LITHOTRIPSY Right 12/16/2020   Procedure: EXTRACORPOREAL SHOCK WAVE LITHOTRIPSY (ESWL);  Surgeon: Alfredo Martinez, MD;  Location:  [facility];  Service: Urology;  Laterality: Right;   HOLMIUM LASER APPLICATION Left 07/15/2012   Procedure: HOLMIUM LASER APPLICATION;  Surgeon: Milford Cage, MD;  Location: [facility];  Service: Urology;  Laterality: Left;   HOLMIUM LASER APPLICATION Right 08/11/2016   Procedure: HOLMIUM LASER APPLICATION;  Surgeon: Ihor Gully, MD;  Location: [facility];  Service: Urology;  Laterality: Right;   HOLMIUM LASER APPLICATION Right 02/11/2019   Procedure: HOLMIUM LASER APPLICATION;  Surgeon: [name], MD;  Location: [facility];  Service: Urology;  Laterality: Right;   TONSILLECTOMY  AS CHILD     OB History     Gravida  2   Para  2   Term  2   Preterm      AB      Living  2      SAB      IAB      Ectopic      Multiple      Live Births  2           Family History  Problem Relation Age of Onset   Thyroid disease Mother    Depression Mother    Cancer Father        leaukemia   Thyroid disease Brother    Diabetes Maternal Aunt    Cancer Maternal Grandmother        GI   Diabetes Maternal Grandfather    Heart disease Paternal Grandmother     Social History   Tobacco Use   Smoking status: Never   Smokeless tobacco: Never  Vaping Use   Vaping Use: Never  used  Substance Use Topics   Alcohol use: Yes    Comment: occasional   Drug use: No    Home Medications Prior to Admission medications   Medication Sig Start Date End Date Taking? Authorizing Provider  morphine (MSIR) 15 MG tablet Take 0.5 tablets (7.5 mg total) by mouth every 4 (four) hours as needed for severe pain. 03/05/21  Yes Deno Etienne, DO  ondansetron (ZOFRAN ODT) 4 MG disintegrating tablet 4mg  ODT q4 hours prn nausea/vomit 03/05/21  Yes Deno Etienne, DO  tamsulosin (FLOMAX) 0.4 MG CAPS capsule Take 1 capsule (0.4 mg total) by mouth daily after supper. 03/05/21  Yes Deno Etienne, DO  acetaminophen (TYLENOL) 500 MG tablet Take  1,000 mg by mouth every 6 (six) hours as needed for moderate pain.    [provider]  diclofenac (VOLTAREN) 75 MG EC tablet Take 1 tablet (75 mg total) by mouth 2 (two) times daily. Patient not taking: No sig reported 10/06/20   Wallene Huh, DPM  HYDROcodone-acetaminophen (NORCO) 5-325 MG tablet Take 1-2 tablets by mouth every 6 (six) hours as needed for moderate pain. 12/16/20   Bjorn Loser, MD  ketorolac (TORADOL) 10 MG tablet Take 10 mg by mouth every 6 (six) hours as needed for pain. 11/10/20   [provider]  Levonorgest-Eth Estrad-Fe Bisg (BALCOLTRA) 0.1-20 MG-MCG(21) TABS Take 1 tablet by mouth at bedtime. Sample from MD    [provider]  levonorgestrel (MIRENA) 20 MCG/24HR IUD 1 each by Intrauterine route once.    [provider]  ondansetron (ZOFRAN) 4 MG tablet Take 4 mg by mouth every 8 (eight) hours as needed for nausea or vomiting.    [provider]  potassium citrate (UROCIT-K) 10 MEQ (1080 MG) SR tablet Take 10 mEq by mouth 3 (three) times daily with meals.    [provider]  promethazine (PHENERGAN) 25 MG tablet Take 25 mg by mouth every 6 (six) hours as needed for nausea/vomiting. 11/10/20   [provider]    Allergies    Adhesive [tape]  Review of Systems   Review of Systems  Constitutional:  Negative for chills and fever.  HENT:  Negative for congestion and rhinorrhea.   Eyes:  Negative for redness and visual disturbance.  Respiratory:  Negative for shortness of breath and wheezing.   Cardiovascular:  Negative for chest pain and palpitations.  Gastrointestinal:  Positive for abdominal pain, nausea and vomiting.  Genitourinary:  Positive for flank pain. Negative for dysuria and urgency.  Musculoskeletal:  Negative for arthralgias and myalgias.  Skin:  Negative for pallor and wound.  Neurological:  Negative for dizziness and headaches.   Physical Exam Updated Vital Signs BP 116/85   Pulse 72    Temp 98.5 F (36.9 C) (Oral)   Resp 20   Ht 5\' 2"  (1.575 m)   Wt 88.5 kg   SpO2 99%   BMI 35.67 kg/m   Physical Exam Vitals and nursing note reviewed.  Constitutional:      General: She is not in acute distress.    Appearance: She is well-developed. She is not diaphoretic.  HENT:     Head: Normocephalic and atraumatic.  Eyes:     Pupils: Pupils are equal, round, and reactive to light.  Cardiovascular:     Rate and Rhythm: Normal rate and regular rhythm.     Heart sounds: No murmur heard.   No friction rub. No gallop.  Pulmonary:     Effort: Pulmonary effort is  normal.     Breath sounds: No wheezing or rales.  Abdominal:     General: There is no distension.     Palpations: Abdomen is soft.     Tenderness: There is no abdominal tenderness.  Musculoskeletal:        General: No tenderness.     Cervical back: Normal range of motion and neck supple.  Skin:    General: Skin is warm and dry.  Neurological:     Mental Status: She is alert and oriented to person, place, and time.  Psychiatric:        Behavior: Behavior normal.    ED Results / Procedures / Treatments   Labs (all labs ordered are listed, but only abnormal results are displayed) Labs Reviewed  URINALYSIS, ROUTINE W REFLEX MICROSCOPIC - Abnormal; Notable for the following components:      Result Value   APPearance HAZY (*)    Hgb urine dipstick MODERATE (*)    Leukocytes,Ua SMALL (*)    Bacteria, UA RARE (*)    All other components within normal limits  BASIC METABOLIC PANEL - Abnormal; Notable for the following components:   Potassium 3.3 (*)    CO2 18 (*)    Glucose, Bld 120 (*)    Creatinine, Ser 1.05 (*)    Calcium 8.6 (*)    All other components within normal limits  CBC - Abnormal; Notable for the following components:   Hemoglobin 9.7 (*)    HCT 31.9 (*)    MCV 69.7 (*)    MCH 21.2 (*)    RDW 17.2 (*)    All other components within normal limits  URINE CULTURE  I-STAT BETA HCG BLOOD, ED ([facility],  [facility], [facility] ONLY)    EKG None  Radiology No results found.  Procedures Procedures   Medications Ordered in ED Medications  sodium chloride 0.9 % bolus 1,000 mL (1,000 mLs Intravenous New Bag/Given 03/05/21 0511)  morphine 4 MG/ML injection 8 mg (8 mg Intravenous Given 03/05/21 0511)  ondansetron (ZOFRAN) injection 4 mg (4 mg Intravenous Given 03/05/21 0507)  ketorolac (TORADOL) 15 MG/ML injection 15 mg (15 mg Intravenous Given 03/05/21 [id number])    ED Course  I have reviewed the triage vital signs and the nursing notes.  Pertinent labs & imaging results that were available during my care of the patient were reviewed by me and considered in my medical decision making (see chart for details).    MDM Rules/Calculators/A&P                           45 yo F with a chief complaints of right flank pain.  Going on for couple days.  Feels like her prior episodes of kidney stones.  Has tried home therapy without significant improvement.  Has had persistent nausea and vomiting.  Will obtain a UA.  Bolus of IV fluids treat the patient's pain and nausea.  The patient has had a kidney stone that required intervention but she is also had multiple visits to the ED.  I had a discussion with her about this and she is apprehensive to have another CT scan done today.  We will attempt to control her symptoms.  Patient's UA without infection.  On reassessment patient feeling quite a bit better.  Requesting discharge home.  Urology follow-up.  6:04 AM:  I have discussed the diagnosis/risks/treatment options with the patient and family and believe the pt to be eligible for  discharge home to follow-up with Urology. We also discussed returning to the ED immediately if new or worsening sx occur. We discussed the sx which are most concerning (e.g., sudden worsening pain, fever, inability to tolerate by mouth ) that necessitate immediate return. Medications administered to the patient during their visit and any new  prescriptions provided to the patient are listed below.  Medications given during this visit Medications  sodium chloride 0.9 % bolus 1,000 mL (1,000 mLs Intravenous New Bag/Given 03/05/21 0511)  morphine 4 MG/ML injection 8 mg (8 mg Intravenous Given 03/05/21 0511)  ondansetron (ZOFRAN) injection 4 mg (4 mg Intravenous Given 03/05/21 0507)  ketorolac (TORADOL) 15 MG/ML injection 15 mg (15 mg Intravenous Given 03/05/21 [id number])     The patient appears reasonably screen and/or stabilized for discharge and I doubt any other medical condition or other [facility] requiring further screening, evaluation, or treatment in the ED at this time prior to discharge.   Final Clinical Impression(s) / ED Diagnoses Final diagnoses:  Nephrolithiasis    Rx / DC Orders ED Discharge Orders          Ordered    morphine (MSIR) 15 MG tablet  Every 4 hours PRN        03/05/21 0603    ondansetron (ZOFRAN ODT) 4 MG disintegrating tablet        03/05/21 0603    tamsulosin (FLOMAX) 0.4 MG CAPS capsule  Daily after supper        03/05/21 0603             Deno Etienne, DO 03/05/21 0604

## 2021-03-05 NOTE — ED Notes (Signed)
Patient signed procedural consent.

## 2021-03-05 NOTE — Anesthesia Postprocedure Evaluation (Signed)
Anesthesia Post Note  Patient: Phyllis House  Procedure(s) Performed: CYSTOURETEROSCOPY WITH RETROGRADE PYELOGRAM/URETERAL STENT PLACEMENT RIGHT (Right)     Patient location during evaluation: PACU Anesthesia Type: General Level of consciousness: awake and alert Pain management: pain level controlled Vital Signs Assessment: post-procedure vital signs reviewed and stable Respiratory status: spontaneous breathing, nonlabored ventilation and respiratory function stable Cardiovascular status: blood pressure returned to baseline and stable Postop Assessment: no apparent nausea or vomiting Anesthetic complications: no   No notable events documented.  Last Vitals:  Vitals:   03/05/21 2305 03/05/21 2309  BP: 100/62 101/61  Pulse: 73 80  Resp: 16 17  Temp:  37 C  SpO2: 97% 97%    Last Pain:  Vitals:   03/05/21 2247  TempSrc:   PainSc: 0-No pain                 Varnell Orvis,W. EDMOND

## 2021-03-05 NOTE — Anesthesia Procedure Notes (Signed)
Procedure Name: LMA Insertion Date/Time: 03/05/2021 10:16 PM Performed by: Epimenio Sarin, CRNA Pre-anesthesia Checklist: Patient identified, Emergency Drugs available, Suction available, Patient being monitored and Timeout performed Patient Re-evaluated:Patient Re-evaluated prior to induction Oxygen Delivery Method: Circle system utilized Preoxygenation: Pre-oxygenation with 100% oxygen Induction Type: IV induction Ventilation: Mask ventilation without difficulty LMA: LMA with gastric port inserted LMA Size: 4.0 Number of attempts: 1 Dental Injury: Teeth and Oropharynx as per pre-operative assessment

## 2021-03-05 NOTE — ED Notes (Signed)
Patient wiped down with 6 CHG wipes per OR request.

## 2021-03-05 NOTE — Transfer of Care (Signed)
Immediate Anesthesia Transfer of Care Note  Patient: Anushree Dorsi  Procedure(s) Performed: Tammi Klippel WITH RETROGRADE PYELOGRAM/URETERAL STENT PLACEMENT RIGHT (Right)  Patient Location: PACU  Anesthesia Type:General  Level of Consciousness: drowsy and patient cooperative  Airway & Oxygen Therapy: Patient Spontanous Breathing and Patient connected to face mask oxygen  Post-op Assessment: Report given to RN and Post -op Vital signs reviewed and stable  Post vital signs: Reviewed and stable  Last Vitals:  Vitals Value Taken Time  BP 104/57 03/05/21 2246  Temp    Pulse 74 03/05/21 2248  Resp 13 03/05/21 2248  SpO2 100 % 03/05/21 2248  Vitals shown include unvalidated device data.  Last Pain:  Vitals:   03/05/21 2028  TempSrc: Oral  PainSc:       Patients Stated Pain Goal: 7 (03/05/21 1837)  Complications: No notable events documented.

## 2021-03-05 NOTE — H&P (Signed)
H&P  Chief Complaint: Right infected obstructing ureteral stone   History of Present Illness: Phyllis House is a 45 y.o. year old who presents with right flank pain x 24 hours with fever to 103F found to have right 77mm obstructing UVJ stone with associated hydronephrosis on CT imaging. Patient presented earlier today for the same but felt better after receiving pain management and opted to not have imaging performed. She returns this afternoon after her pain worsened and she became febrile at home. She had has several stones in the past requiring intervention.   On initial presentation to the ED this evening she was febrile to 103F but otherwise vitals were stable. Lab work notable with UA with large leukocytes, negative nitrites, 20-50WBC, no bacteria. No leukocytosis with WBC 8.8 and creatinine at her baseline of 1.0. She has improved in the ED with antipyretics, fluids and CTX.   Last meal was yesterday evening. Has tolerated anesthesia without issues in the past. No blood thinners.   Past Medical History:  Diagnosis Date   Anemia    pt. denies   Anxiety    Depression    Eczema    H/O varicella    Hematuria    History of herpes genitalis    History of kidney stones    pt has long hx calcium phosphate stones   Hypocalcemia    Migraine    Pneumonia    once 10+ yeaers ago   Polycystic ovarian syndrome    Renal tubular acidosis type I    Right ureteral stone    Seasonal allergies    Urgency of urination     Past Surgical History:  Procedure Laterality Date   CYSTOSCOPY W/ RETROGRADES Right 12/12/2019   Procedure: CYSTOSCOPY WITH RIGHT RETROGRADE PYELOGRAM AND STENT PLACEMENT;  Surgeon: Raynelle Bring, MD;  Location: [facility];  Service: Urology;  Laterality: Right;   CYSTOSCOPY W/ URETERAL STENT PLACEMENT Left 07/01/2012   Procedure: CYSTOSCOPY WITH RETROGRADE PYELOGRAM/URETERAL STENT PLACEMENT;  Surgeon: Molli Hazard, MD;  Location: [facility];  Service: Urology;  Laterality:  Left;   CYSTOSCOPY W/ URETERAL STENT PLACEMENT Right 07/27/2016   Procedure: CYSTOSCOPY WITH RETROGRADE PYELOGRAM/URETERAL STENT PLACEMENT;  Surgeon: Bjorn Loser, MD;  Location: [facility];  Service: Urology;  Laterality: Right;   CYSTOSCOPY W/ URETERAL STENT PLACEMENT Right 05/08/2019   Procedure: CYSTOSCOPY WITH RETROGRADE PYELOGRAM/URETERAL STENT PLACEMENT;  Surgeon: Cleon Gustin, MD;  Location: [facility];  Service: Urology;  Laterality: Right;   CYSTOSCOPY WITH RETROGRADE PYELOGRAM, URETEROSCOPY AND STENT PLACEMENT Left 07/15/2012   Procedure: CYSTOSCOPY WITH left RETROGRADE PYELOGRAM,left  URETEROSCOPY AND left STENT exchange ;  Surgeon: Molli Hazard, MD;  Location: [facility];  Service: Urology;  Laterality: Left;  LEFT URETER STENT EXCHANGE     CYSTOSCOPY WITH RETROGRADE PYELOGRAM, URETEROSCOPY AND STENT PLACEMENT Right 08/11/2016   Procedure: CYSTOSCOPY WITH RIGHT RETROGRADE PYELOGRAM,RIGHT  URETEROSCOPY AND STENT CHANGE;  Surgeon: Kathie Rhodes, MD;  Location: [facility];  Service: Urology;  Laterality: Right;   CYSTOSCOPY/RETROGRADE/URETEROSCOPY/STONE EXTRACTION WITH BASKET  AGE 61   CYSTOSCOPY/URETEROSCOPY/HOLMIUM LASER/STENT PLACEMENT Bilateral 09/19/2018   Procedure: CYSTOSCOPY/RETROGRADE/URETEROSCOPY/HOLMIUM LASER/STENT PLACEMENT;  Surgeon: Festus Aloe, MD;  Location: [facility];  Service: Urology;  Laterality: Bilateral;   CYSTOSCOPY/URETEROSCOPY/HOLMIUM LASER/STENT PLACEMENT Right 10/22/2018   Procedure: CYSTOSCOPY/RETROGRADE/URETEROSCOPY/HOLMIUM LASER/STENT PLACEMENT;  Surgeon: Festus Aloe, MD;  Location: [facility];  Service: Urology;  Laterality: Right;  ONLY NEEDS 60 MIN   CYSTOSCOPY/URETEROSCOPY/HOLMIUM LASER/STENT PLACEMENT Right 02/11/2019   Procedure: CYSTOSCOPY/RETROGRADE/URETEROSCOPY/HOLMIUM  LASER/STENT PLACEMENT;  Surgeon: [name], MD;  Location: [facility];  Service: Urology;   Laterality: Right;   CYSTOSCOPY/URETEROSCOPY/HOLMIUM LASER/STENT PLACEMENT Bilateral 06/03/2019   Procedure: CYSTOSCOPY/RETROGRADE/URETEROSCOPY/HOLMIUM LASER/STENT PLACEMENT;  Surgeon: [name], MD;  Location: [facility];  Service: Urology;  Laterality: Bilateral;   CYSTOSCOPY/URETEROSCOPY/HOLMIUM LASER/STENT PLACEMENT Right 12/30/2019   Procedure: CYSTOSCOPY, RETROGRADE /URETEROSCOPY/HOLMIUM LASER/STENT EXCHANGE;  Surgeon: [name], MD;  Location: [facility];  Service: Urology;  Laterality: Right;   EXTRACORPOREAL SHOCK WAVE LITHOTRIPSY Left 08/07/2019   Procedure: EXTRACORPOREAL SHOCK WAVE LITHOTRIPSY (ESWL);  Surgeon: Noel Christmas, MD;  Location: [facility];  Service: Urology;  Laterality: Left;   EXTRACORPOREAL SHOCK WAVE LITHOTRIPSY Left 04/08/2020   Procedure: EXTRACORPOREAL SHOCK WAVE LITHOTRIPSY (ESWL);  Surgeon: Belva Agee, MD;  Location: [facility];  Service: Urology;  Laterality: Left;   EXTRACORPOREAL SHOCK WAVE LITHOTRIPSY Right 12/16/2020   Procedure: EXTRACORPOREAL SHOCK WAVE LITHOTRIPSY (ESWL);  Surgeon: Alfredo Martinez, MD;  Location: [facility];  Service: Urology;  Laterality: Right;   HOLMIUM LASER APPLICATION Left 07/15/2012   Procedure: HOLMIUM LASER APPLICATION;  Surgeon: Milford Cage, MD;  Location: [facility];  Service: Urology;  Laterality: Left;   HOLMIUM LASER APPLICATION Right 08/11/2016   Procedure: HOLMIUM LASER APPLICATION;  Surgeon: Ihor Gully, MD;  Location: [facility];  Service: Urology;  Laterality: Right;   HOLMIUM LASER APPLICATION Right 02/11/2019   Procedure: HOLMIUM LASER APPLICATION;  Surgeon: [name], MD;  Location: [facility];  Service: Urology;  Laterality: Right;   TONSILLECTOMY  AS CHILD    Home Medications:  No current facility-administered medications on file prior to encounter.    Current Outpatient Medications on File Prior to Encounter  Medication Sig Dispense Refill   acetaminophen (TYLENOL) 500 MG tablet Take 1,000 mg by mouth every 6 (six) hours as needed for moderate pain.     diclofenac (VOLTAREN) 75 MG EC tablet Take 1 tablet (75 mg total) by mouth 2 (two) times daily. (Patient not taking: No sig reported) 50 tablet 2   HYDROcodone-acetaminophen (NORCO) 5-325 MG tablet Take 1-2 tablets by mouth every 6 (six) hours as needed for moderate pain. 14 tablet 0   ketorolac (TORADOL) 10 MG tablet Take 10 mg by mouth every 6 (six) hours as needed for pain.     Levonorgest-Eth Estrad-Fe Bisg (BALCOLTRA) 0.1-20 MG-MCG(21) TABS Take 1 tablet by mouth at bedtime. Sample from MD     levonorgestrel (MIRENA) 20 MCG/24HR IUD 1 each by Intrauterine route once.     morphine (MSIR) 15 MG tablet Take 0.5 tablets (7.5 mg total) by mouth every 4 (four) hours as needed for severe pain. 5 tablet 0   ondansetron (ZOFRAN ODT) 4 MG disintegrating tablet 4mg  ODT q4 hours prn nausea/vomit 20 tablet 0   ondansetron (ZOFRAN) 4 MG tablet Take 4 mg by mouth every 8 (eight) hours as needed for nausea or vomiting.     potassium citrate (UROCIT-K) 10 MEQ (1080 MG) SR tablet Take 10 mEq by mouth 3 (three) times daily with meals.     promethazine (PHENERGAN) 25 MG tablet Take 25 mg by mouth every 6 (six) hours as needed for nausea/vomiting.     tamsulosin (FLOMAX) 0.4 MG CAPS capsule Take 1 capsule (0.4 mg total) by mouth daily after supper. 30 capsule 0     Allergies:  Allergies  Allergen Reactions   Adhesive [Tape] Rash  Pt states paper tape causes rash, but "plastic tape" is not problematic.     Family History  Problem Relation Age of Onset   Thyroid disease Mother    Depression Mother    Cancer Father        leaukemia   Thyroid disease Brother    Diabetes Maternal Aunt    Cancer Maternal Grandmother        GI   Diabetes Maternal Grandfather    Heart disease Paternal Grandmother      Social History:  reports that she has never smoked. She has never used smokeless tobacco. She reports current alcohol use. She reports that she does not use drugs.  ROS: A complete review of systems was performed.  All systems are negative except for pertinent findings as noted.  Physical Exam:  Vital signs in last 24 hours: Temp:  [98.5 F (36.9 C)-103.2 F (39.6 C)] 98.6 F (37 C) (11/05 2028) Pulse Rate:  [64-101] 84 (11/05 2042) Resp:  [15-22] 16 (11/05 2042) BP: (107-151)/(64-108) 107/67 (11/05 2042) SpO2:  [98 %-100 %] 98 % (11/05 2042) Weight:  [81.6 kg-88.5 kg] 81.6 kg (11/05 1837) Constitutional:  Alert and oriented, No acute distress Cardiovascular: Regular rate and rhythm, No JVD Respiratory: Normal respiratory effort, Lungs clear bilaterally GI: Abdomen is soft, nontender, nondistended, no abdominal masses GU: No CVA tenderness Lymphatic: No lymphadenopathy Neurologic: Grossly intact, no focal deficits Psychiatric: Normal mood and affect  Laboratory Data:  Recent Labs    03/05/21 0450 03/05/21 1930  WBC 5.9 8.8  HGB 9.7* 9.4*  HCT 31.9* 31.1*  PLT 217 165    Recent Labs    03/05/21 0450 03/05/21 1930  NA 135 131*  K 3.3* 3.1*  CL 109 109  GLUCOSE 120* 120*  BUN 17 15  CALCIUM 8.6* 8.1*  CREATININE 1.05* 0.99     Results for orders placed or performed during the hospital encounter of 03/05/21 (from the past 24 hour(s))  Urinalysis, Routine w reflex microscopic     Status: Abnormal   Collection Time: 03/05/21  6:51 PM  Result Value Ref Range   Color, Urine STRAW (A) YELLOW   APPearance CLEAR CLEAR   Specific Gravity, Urine 1.006 1.005 - 1.030   pH 8.0 5.0 - 8.0   Glucose, UA NEGATIVE NEGATIVE mg/dL   Hgb urine dipstick MODERATE (A) NEGATIVE   Bilirubin Urine NEGATIVE NEGATIVE   Ketones, ur NEGATIVE NEGATIVE mg/dL   Protein, ur 30 (A) NEGATIVE mg/dL   Nitrite NEGATIVE NEGATIVE   Leukocytes,Ua LARGE (A) NEGATIVE   RBC / HPF 11-20 0 - 5  RBC/hpf   WBC, UA 21-50 0 - 5 WBC/hpf   Bacteria, UA NONE SEEN NONE SEEN   Squamous Epithelial / LPF 0-5 0 - 5  Resp Panel by RT-PCR (Flu A&B, Covid) Nasopharyngeal Swab     Status: None   Collection Time: 03/05/21  6:52 PM   Specimen: Nasopharyngeal Swab; Nasopharyngeal(NP) swabs in vial transport medium  Result Value Ref Range   SARS Coronavirus 2 by RT PCR NEGATIVE NEGATIVE   Influenza A by PCR NEGATIVE NEGATIVE   Influenza B by PCR NEGATIVE NEGATIVE  Comprehensive metabolic panel     Status: Abnormal   Collection Time: 03/05/21  7:30 PM  Result Value Ref Range   Sodium 131 (L) 135 - 145 mmol/L   Potassium 3.1 (L) 3.5 - 5.1 mmol/L   Chloride 109 98 - 111 mmol/L   CO2 16 (L) 22 - 32 mmol/L  Glucose, Bld 120 (H) 70 - 99 mg/dL   BUN 15 6 - 20 mg/dL   Creatinine, Ser 0.99 0.44 - 1.00 mg/dL   Calcium 8.1 (L) 8.9 - 10.3 mg/dL   Total Protein 6.5 6.5 - 8.1 g/dL   Albumin 3.8 3.5 - 5.0 g/dL   AST 16 15 - 41 U/L   ALT 14 0 - 44 U/L   Alkaline Phosphatase 46 38 - 126 U/L   Total Bilirubin 0.9 0.3 - 1.2 mg/dL   GFR, Estimated >60 >60 mL/min   Anion gap 6 5 - 15  CBC with Differential     Status: Abnormal   Collection Time: 03/05/21  7:30 PM  Result Value Ref Range   WBC 8.8 4.0 - 10.5 K/uL   RBC 4.45 3.87 - 5.11 MIL/uL   Hemoglobin 9.4 (L) 12.0 - 15.0 g/dL   HCT 31.1 (L) 36.0 - 46.0 %   MCV 69.9 (L) 80.0 - 100.0 fL   MCH 21.1 (L) 26.0 - 34.0 pg   MCHC 30.2 30.0 - 36.0 g/dL   RDW 17.1 (H) 11.5 - 15.5 %   Platelets 165 150 - 400 K/uL   nRBC 0.0 0.0 - 0.2 %   Neutrophils Relative % 93 %   Neutro Abs 8.1 (H) 1.7 - 7.7 K/uL   Lymphocytes Relative 2 %   Lymphs Abs 0.2 (L) 0.7 - 4.0 K/uL   Monocytes Relative 5 %   Monocytes Absolute 0.4 0.1 - 1.0 K/uL   Eosinophils Relative 0 %   Eosinophils Absolute 0.0 0.0 - 0.5 K/uL   Basophils Relative 0 %   Basophils Absolute 0.0 0.0 - 0.1 K/uL   Immature Granulocytes 0 %   Abs Immature Granulocytes 0.03 0.00 - 0.07 K/uL   Recent  Results (from the past 240 hour(s))  Resp Panel by RT-PCR (Flu A&B, Covid) Nasopharyngeal Swab     Status: None   Collection Time: 03/05/21  6:52 PM   Specimen: Nasopharyngeal Swab; Nasopharyngeal(NP) swabs in vial transport medium  Result Value Ref Range Status   SARS Coronavirus 2 by RT PCR NEGATIVE NEGATIVE Final    Comment: (NOTE) SARS-CoV-2 target nucleic acids are NOT DETECTED.  The SARS-CoV-2 RNA is generally detectable in upper respiratory specimens during the acute phase of infection. The lowest concentration of SARS-CoV-2 viral copies this assay can detect is 138 copies/mL. A negative result does not preclude SARS-Cov-2 infection and should not be used as the sole basis for treatment or other patient management decisions. A negative result may occur with  improper specimen collection/handling, submission of specimen other than nasopharyngeal swab, presence of viral mutation(s) within the areas targeted by this assay, and inadequate number of viral copies(<138 copies/mL). A negative result must be combined with clinical observations, patient history, and epidemiological information. The expected result is Negative.  Fact Sheet for Patients:  EntrepreneurPulse.com.au  Fact Sheet for Healthcare Providers:  IncredibleEmployment.be  This test is no t yet approved or cleared by the [place] FDA and  has been authorized for detection and/or diagnosis of SARS-CoV-2 by FDA under an Emergency Use Authorization (EUA). This EUA will remain  in effect (meaning this test can be used) for the duration of the COVID-19 declaration under Section 564(b)(1) of the Act, 21 U.S.C.section 360bbb-3(b)(1), unless the authorization is terminated  or revoked sooner.       Influenza A by PCR NEGATIVE NEGATIVE Final   Influenza B by PCR NEGATIVE NEGATIVE Final    Comment: (NOTE) The  Xpert Xpress SARS-CoV-2/FLU/RSV plus assay is intended as an aid in the  diagnosis of influenza from Nasopharyngeal swab specimens and should not be used as a sole basis for treatment. Nasal washings and aspirates are unacceptable for Xpert Xpress SARS-CoV-2/FLU/RSV testing.  Fact Sheet for Patients: EntrepreneurPulse.com.au  Fact Sheet for Healthcare Providers: IncredibleEmployment.be  This test is not yet approved or cleared by the [place] FDA and has been authorized for detection and/or diagnosis of SARS-CoV-2 by FDA under an Emergency Use Authorization (EUA). This EUA will remain in effect (meaning this test can be used) for the duration of the COVID-19 declaration under Section 564(b)(1) of the Act, 21 U.S.C. section 360bbb-3(b)(1), unless the authorization is terminated or revoked.  Performed at [facility], [place] [address], [place], [place] [address]     Renal Function: Recent Labs    03/05/21 0450 03/05/21 1930  CREATININE 1.05* 0.99   Estimated Creatinine Clearance: 70.3 mL/min (by C-G formula based on SCr of 0.99 mg/dL).  Radiologic Imaging: CT Renal Stone Study  Result Date: 03/05/2021 CLINICAL DATA:  Right flank pain history of renal stones EXAM: CT ABDOMEN AND PELVIS WITHOUT CONTRAST TECHNIQUE: Multidetector CT imaging of the abdomen and pelvis was performed following the standard protocol without IV contrast. COMPARISON:  CT November 14, 2020 FINDINGS: Lower chest: No acute abnormality. Hepatobiliary: Unremarkable noncontrast appearance of the hepatic parenchyma. Gallbladder is unremarkable. No biliary ductal dilation. Pancreas: No pancreatic ductal dilation or evidence of acute inflammation. Spleen: Within normal limits. Adrenals/Urinary Tract: Bilateral adrenal glands are unremarkable. Increased severe right-sided hydroureteronephrosis to the level of a 6 mm stone at the ureterovesicular junction now with new perinephric and periureteric stranding. No left-sided hydronephrosis.  Additional bilateral nonobstructive nephrolithiasis. Stomach/Bowel: No enteric contrast was administered. Stomach is unremarkable for degree of distension. No pathologic dilation of small or large bowel. The appendix and terminal ileum appear normal. No evidence of acute bowel inflammation. Vascular/Lymphatic: No abdominal aortic aneurysm. No pathologically enlarged abdominal or pelvic lymph nodes. Reproductive: Intrauterine device appears appropriate in positioning. Right adnexa is unremarkable. 2.2 cm left ovarian cyst. 2.2 cm left ovarian simple-appearing cyst. No follow-up imaging is recommended. Reference: JACR 2020 Feb;17(2):248-254 Other: No significant abdominopelvic ascites. Musculoskeletal: Thoracolumbar spondylosis. No acute osseous abnormality. IMPRESSION: 1. Increased severe right-sided hydroureteronephrosis to the level of an obstructive 6 mm stone at the right ureterovesicular junction now with new perinephric and periureteric stranding. Correlation with urinalysis for superimposed infection is suggested. 2. Additional bilateral nonobstructive nephrolithiasis. Electronically Signed   By: Dahlia Bailiff M.D.   On: 03/05/2021 19:32    Impression/Assessment:  44 y/o female with long history of nephrolithiasis who presents with infected obstructing right ureteral stone.  We discussed the indications for acute intervention including infected obstruction, bilateral ureteral obstruction or unilateral obstruction of solitary kidney as well as other less urgent indications for decompression which would included intractable pain, N/V, and acute renal injury. We also discussed the possible inability to place a ureteral stent in which case, the next intervention recommended would be a percutaneous nephrostomy tube. Given infected obstruction is present, plan for urgent right ureteral stent placement.  Plan: Plan for urgent right ureteral stent placement Case discussed with OR front desk and posted Keep  patient NPO Send urine culture at this time, prior to OR Received 1g CTX in the ED  COVID test negative  7.   Patient to be admitted post operatively   Aldine Contes 03/05/2021, 9:12 PM

## 2021-03-05 NOTE — Discharge Instructions (Signed)
Take 4 over the counter ibuprofen tablets 3 times a day or 2 over-the-counter naproxen tablets twice a day for pain. Also take tylenol 1000mg (2 extra strength) four times a day.   Then take the pain medicine if you feel like you need it. Narcotics do not help with the pain, they only make you care about it less.  You can become addicted to this, people may break into your house to steal it.  It will constipate you.  If you drive under the influence of this medicine you can get a DUI.    Follow up with your urologist.

## 2021-03-05 NOTE — ED Notes (Signed)
Patient educated on the importance of not drinking alcohol/driving after taking morphine that was prescribed.

## 2021-03-05 NOTE — ED Provider Notes (Signed)
[facility] DEPT Provider Note   CSN: [id number] Arrival date & time: 03/05/21  1820     History Chief Complaint  Patient presents with   Fever    Phyllis House is a 45 y.o. female.  45 year old female with prior medical history as detailed below presents for evaluation.  Patient with longstanding reported prior history of prior renal colic.  Patient was seen approximately 10 hours previously for right flank pain.  Patient did not consent to CT imaging this morning.  She complains of right-sided flank pain that has been ongoing for several days.  Over the course of today after her ED evaluation she developed a temperature of 103.  She is concerned about possible infection related to ureteral stone.  The history is provided by the patient.  Illness Location:  Right flank pain and fever Severity:  Moderate Onset quality:  Gradual Duration:  1 day Timing:  Constant Progression:  Waxing and waning Chronicity:  Recurrent     Past Medical History:  Diagnosis Date   Anemia    pt. denies   Anxiety    Depression    Eczema    H/O varicella    Hematuria    History of herpes genitalis    History of kidney stones    pt has long hx calcium phosphate stones   Hypocalcemia    Migraine    Pneumonia    once 10+ yeaers ago   Polycystic ovarian syndrome    Renal tubular acidosis type I    Right ureteral stone    Seasonal allergies    Urgency of urination     Patient Active Problem List   Diagnosis Date Noted   Anxiety 10/06/2020   Obesity 10/06/2020   Ureteral calculus 12/12/2019   Renal tubular acidosis type I 06/24/2019   Hydronephrosis concurrent with and due to calculi of kidney and ureter 05/08/2019   Migraine without aura and without status migrainosus, not intractable 11/19/2013   Other malaise and fatigue 03/14/2013   Hypokalemia 12/26/2012   Infection of urinary tract 07/02/2012   Ureteral calculus, left 07/01/2012   Fever,  unspecified 07/01/2012    Past Surgical History:  Procedure Laterality Date   CYSTOSCOPY W/ RETROGRADES Right 12/12/2019   Procedure: CYSTOSCOPY WITH RIGHT RETROGRADE PYELOGRAM AND STENT PLACEMENT;  Surgeon: Raynelle Bring, MD;  Location: [facility];  Service: Urology;  Laterality: Right;   CYSTOSCOPY W/ URETERAL STENT PLACEMENT Left 07/01/2012   Procedure: CYSTOSCOPY WITH RETROGRADE PYELOGRAM/URETERAL STENT PLACEMENT;  Surgeon: Molli Hazard, MD;  Location: [facility];  Service: Urology;  Laterality: Left;   CYSTOSCOPY W/ URETERAL STENT PLACEMENT Right 07/27/2016   Procedure: CYSTOSCOPY WITH RETROGRADE PYELOGRAM/URETERAL STENT PLACEMENT;  Surgeon: Bjorn Loser, MD;  Location: [facility];  Service: Urology;  Laterality: Right;   CYSTOSCOPY W/ URETERAL STENT PLACEMENT Right 05/08/2019   Procedure: CYSTOSCOPY WITH RETROGRADE PYELOGRAM/URETERAL STENT PLACEMENT;  Surgeon: Cleon Gustin, MD;  Location: [facility];  Service: Urology;  Laterality: Right;   CYSTOSCOPY WITH RETROGRADE PYELOGRAM, URETEROSCOPY AND STENT PLACEMENT Left 07/15/2012   Procedure: CYSTOSCOPY WITH left RETROGRADE PYELOGRAM,left  URETEROSCOPY AND left STENT exchange ;  Surgeon: Molli Hazard, MD;  Location: [facility];  Service: Urology;  Laterality: Left;  LEFT URETER STENT EXCHANGE     CYSTOSCOPY WITH RETROGRADE PYELOGRAM, URETEROSCOPY AND STENT PLACEMENT Right 08/11/2016   Procedure: CYSTOSCOPY WITH RIGHT RETROGRADE PYELOGRAM,RIGHT  URETEROSCOPY AND STENT CHANGE;  Surgeon: Kathie Rhodes, MD;  Location: [facility];  Service: Urology;  Laterality: Right;   CYSTOSCOPY/RETROGRADE/URETEROSCOPY/STONE EXTRACTION WITH BASKET  AGE 22   CYSTOSCOPY/URETEROSCOPY/HOLMIUM LASER/STENT PLACEMENT Bilateral 09/19/2018   Procedure: CYSTOSCOPY/RETROGRADE/URETEROSCOPY/HOLMIUM LASER/STENT PLACEMENT;  Surgeon: Festus Aloe, MD;  Location: [facility];  Service: Urology;  Laterality: Bilateral;    CYSTOSCOPY/URETEROSCOPY/HOLMIUM LASER/STENT PLACEMENT Right 10/22/2018   Procedure: CYSTOSCOPY/RETROGRADE/URETEROSCOPY/HOLMIUM LASER/STENT PLACEMENT;  Surgeon: Festus Aloe, MD;  Location: [facility];  Service: Urology;  Laterality: Right;  ONLY NEEDS 60 MIN   CYSTOSCOPY/URETEROSCOPY/HOLMIUM LASER/STENT PLACEMENT Right 02/11/2019   Procedure: CYSTOSCOPY/RETROGRADE/URETEROSCOPY/HOLMIUM LASER/STENT PLACEMENT;  Surgeon: Festus Aloe, MD;  Location: [facility];  Service: Urology;  Laterality: Right;   CYSTOSCOPY/URETEROSCOPY/HOLMIUM LASER/STENT PLACEMENT Bilateral 06/03/2019   Procedure: CYSTOSCOPY/RETROGRADE/URETEROSCOPY/HOLMIUM LASER/STENT PLACEMENT;  Surgeon: Festus Aloe, MD;  Location: [facility];  Service: Urology;  Laterality: Bilateral;   CYSTOSCOPY/URETEROSCOPY/HOLMIUM LASER/STENT PLACEMENT Right 12/30/2019   Procedure: CYSTOSCOPY, RETROGRADE /URETEROSCOPY/HOLMIUM LASER/STENT EXCHANGE;  Surgeon: Festus Aloe, MD;  Location: [facility];  Service: Urology;  Laterality: Right;   EXTRACORPOREAL SHOCK WAVE LITHOTRIPSY Left 08/07/2019   Procedure: EXTRACORPOREAL SHOCK WAVE LITHOTRIPSY (ESWL);  Surgeon: Robley Fries, MD;  [facility]  Service: Urology;  Laterality: Left;   EXTRACORPOREAL SHOCK WAVE LITHOTRIPSY Left 04/08/2020   Procedure: EXTRACORPOREAL SHOCK WAVE LITHOTRIPSY (ESWL);  Surgeon: Remi Haggard, MD;  Location: [facility];  Service: Urology;  Laterality: Left;   EXTRACORPOREAL SHOCK WAVE LITHOTRIPSY Right 12/16/2020   Procedure: EXTRACORPOREAL SHOCK WAVE LITHOTRIPSY (ESWL);  Surgeon: Bjorn Loser, MD;  Location: [facility];  Service: Urology;  Laterality: Right;   HOLMIUM LASER APPLICATION Left [id number]   Procedure: HOLMIUM LASER APPLICATION;  Surgeon: Molli Hazard, MD;  Location: [facility];  Service: Urology;   Laterality: Left;   HOLMIUM LASER APPLICATION Right [id number]   Procedure: HOLMIUM LASER APPLICATION;  Surgeon: Kathie Rhodes, MD;  Location: [facility];  Service: Urology;  Laterality: Right;   HOLMIUM LASER APPLICATION Right [id number]   Procedure: HOLMIUM LASER APPLICATION;  Surgeon: Festus Aloe, MD;  Location: [facility];  Service: Urology;  Laterality: Right;   TONSILLECTOMY  AS CHILD     OB History     Gravida  2   Para  2   Term  2   Preterm      AB      Living  2      SAB      IAB      Ectopic      Multiple      Live Births  2           Family History  Problem Relation Age of Onset   Thyroid disease Mother    Depression Mother    Cancer Father        leaukemia   Thyroid disease Brother    Diabetes Maternal Aunt    Cancer Maternal Grandmother        GI   Diabetes Maternal Grandfather    Heart disease Paternal Grandmother     Social History   Tobacco Use   Smoking status: Never   Smokeless tobacco: Never  Vaping Use   Vaping Use: Never used  Substance Use Topics   Alcohol use: Yes    Comment: occasional   Drug use: No    Home Medications Prior to Admission medications   Medication Sig Start Date End Date Taking? Authorizing Provider  acetaminophen (TYLENOL) 500 MG tablet Take 1,000 mg by mouth every  6 (six) hours as needed for moderate pain.    [provider]  diclofenac (VOLTAREN) 75 MG EC tablet Take 1 tablet (75 mg total) by mouth 2 (two) times daily. Patient not taking: No sig reported 10/06/20   Lenn Sink, DPM  HYDROcodone-acetaminophen (NORCO) 5-325 MG tablet Take 1-2 tablets by mouth every 6 (six) hours as needed for moderate pain. 12/16/20   Alfredo Martinez, MD  ketorolac (TORADOL) 10 MG tablet Take 10 mg by mouth every 6 (six) hours as needed for pain. 11/10/20   [provider]  Levonorgest-Eth Estrad-Fe Bisg (BALCOLTRA) 0.1-20 MG-MCG(21) TABS Take 1 tablet by mouth at  bedtime. Sample from MD    [provider]  levonorgestrel (MIRENA) 20 MCG/24HR IUD 1 each by Intrauterine route once.    [provider]  morphine (MSIR) 15 MG tablet Take 0.5 tablets (7.5 mg total) by mouth every 4 (four) hours as needed for severe pain. 03/05/21   Melene Plan, DO  ondansetron (ZOFRAN ODT) 4 MG disintegrating tablet 4mg  ODT q4 hours prn nausea/vomit 03/05/21   13/5/22, DO  ondansetron (ZOFRAN) 4 MG tablet Take 4 mg by mouth every 8 (eight) hours as needed for nausea or vomiting.    [provider]  potassium citrate (UROCIT-K) 10 MEQ (1080 MG) SR tablet Take 10 mEq by mouth 3 (three) times daily with meals.    [provider]  promethazine (PHENERGAN) 25 MG tablet Take 25 mg by mouth every 6 (six) hours as needed for nausea/vomiting. 11/10/20   [provider]  tamsulosin (FLOMAX) 0.4 MG CAPS capsule Take 1 capsule (0.4 mg total) by mouth daily after supper. 03/05/21   13/5/22, DO    Allergies    Adhesive [tape]  Review of Systems   Review of Systems  All other systems reviewed and are negative.  Physical Exam Updated Vital Signs BP 136/76 (BP Location: Right Arm)   Pulse 97   Temp (!) 103.2 F (39.6 C) (Oral)   Resp 16   Ht 5\' 2"  (1.575 m)   Wt 81.6 kg   SpO2 100%   BMI 32.92 kg/m   Physical Exam Vitals and nursing note reviewed.  Constitutional:      General: She is not in acute distress.    Appearance: Normal appearance. She is well-developed.  HENT:     Head: Normocephalic and atraumatic.  Eyes:     Conjunctiva/sclera: Conjunctivae normal.     Pupils: Pupils are equal, round, and reactive to light.  Cardiovascular:     Rate and Rhythm: Normal rate and regular rhythm.     Heart sounds: Normal heart sounds.  Pulmonary:     Effort: Pulmonary effort is normal. No respiratory distress.     Breath sounds: Normal breath sounds.  Abdominal:     General: There is no distension.     Palpations: Abdomen is soft.      Tenderness: There is abdominal tenderness.     Comments: Mild TTP to RLQ  Musculoskeletal:        General: No deformity. Normal range of motion.     Cervical back: Normal range of motion and neck supple.  Skin:    General: Skin is warm and dry.  Neurological:     General: No focal deficit present.     Mental Status: She is alert and oriented to person, place, and time.    ED Results / Procedures / Treatments   Labs (all labs ordered are listed,  but only abnormal results are displayed) Labs Reviewed  CULTURE, BLOOD (ROUTINE X 2)  CULTURE, BLOOD (ROUTINE X 2)  URINE CULTURE  RESP PANEL BY RT-PCR (FLU A&B, COVID) ARPGX2  COMPREHENSIVE METABOLIC PANEL  CBC WITH DIFFERENTIAL/PLATELET  URINALYSIS, ROUTINE W REFLEX MICROSCOPIC    EKG None  Radiology No results found.  Procedures Procedures   Medications Ordered in ED Medications  sodium chloride 0.9 % bolus 1,000 mL (has no administration in time range)  acetaminophen (TYLENOL) tablet 1,000 mg (has no administration in time range)  ondansetron (ZOFRAN) injection 4 mg (has no administration in time range)  morphine 4 MG/ML injection 4 mg (has no administration in time range)  cefTRIAXone (ROCEPHIN) 1 g in sodium chloride 0.9 % 100 mL IVPB (has no administration in time range)    ED Course  I have reviewed the triage vital signs and the nursing notes.  Pertinent labs & imaging results that were available during my care of the patient were reviewed by me and considered in my medical decision making (see chart for details).    MDM Rules/Calculators/A&P                           MDM  MSE complete  Latika Leezer was evaluated in Emergency Department on 03/05/2021 for the symptoms described in the history of present illness. She was evaluated in the context of the global COVID-19 pandemic, which necessitated consideration that the patient might be at risk for infection with the SARS-CoV-2 virus that causes COVID-19.  Institutional protocols and algorithms that pertain to the evaluation of patients at risk for COVID-19 are in a state of rapid change based on information released by regulatory bodies including the CDC and federal and state organizations. These policies and algorithms were followed during the patient's care in the ED.   Patient with longstanding history of renal colic. Patient presented earlier today with right-sided flank pain.  She deferred CT imaging at that time.  She returns now with temperature of 103.  CT imaging reveals right-sided ureteral stone 6 mm.  Urology is aware of case and will plan for stenting.   Final Clinical Impression(s) / ED Diagnoses Final diagnoses:  Fever, unspecified fever cause  Renal colic    Rx / DC Orders ED Discharge Orders     None        Valarie Merino, MD 03/05/21 2137

## 2021-03-05 NOTE — Op Note (Signed)
.  Preoperative diagnosis: right ureteral stone, sepsis  Postoperative diagnosis: Same  Procedure: 1 cystoscopy 2. right retrograde pyelography 3.  Intraoperative fluoroscopy, under one hour, with interpretation 4. right 6 x 24 JJ stent placement  Attending: Wilkie Aye, MD  Resident: Judson Roch, MD  Anesthesia: General  Estimated blood loss: None  Drains: Right 6 x 24 JJ ureteral stent without tether, 16 French foley catheter  Specimens: urine for culture  Antibiotics: rocephin  Findings:right distal ureteral calculus. Severe hydronephrosis. No masses/lesions in the bladder. Ureteral orifices in normal anatomic location.  Indications: Patient is a 45 year old female with a history of right ureteral stone and concern for sepsis.  After discussing treatment options, they decided proceed with right stent placement.  Procedure  in detail: The patient was brought to the operating room and a brief timeout was done to ensure correct patient, correct procedure, correct site.  General anesthesia was administered patient was placed in dorsal lithotomy position.  Their genitalia was then prepped and draped in usual sterile fashion.  A rigid 22 French cystoscope was passed in the urethra and the bladder.  Bladder was inspected free masses or lesions.  the ureteral orifices were in the normal orthotopic locations.  a 6 french ureteral catheter was then instilled into the right ureteral orifice.  a gentle retrograde was obtained and findings noted above.  we then placed a zip wire through the ureteral catheter and advanced up to the renal pelvis.    We then placed a 6 x 24 double-j ureteral stent over the original zip wire.  We then removed the wire and good coil was noted in the the renal pelvis under fluoroscopy and the bladder under direct vision.  A foley catheter was then placed. the bladder was then drained and this concluded the procedure which was well tolerated by  patient.  Complications: None  Condition: Stable, extubated, transferred to PACU  Plan: Patient is to be admitted for IV antibiotics. She will have her stone extraction in 2 weeks.

## 2021-03-05 NOTE — Anesthesia Preprocedure Evaluation (Addendum)
Anesthesia Evaluation  Patient identified by MRN, date of birth, ID band Patient awake    Reviewed: Allergy & Precautions, H&P , NPO status , Patient's Chart, lab work & pertinent test results  Airway Mallampati: II  TM Distance: >3 FB Neck ROM: Full    Dental no notable dental hx. (+) Teeth Intact, Dental Advisory Given   Pulmonary neg pulmonary ROS,    Pulmonary exam normal breath sounds clear to auscultation       Cardiovascular negative cardio ROS   Rhythm:Regular Rate:Normal     Neuro/Psych  Headaches, Anxiety Depression    GI/Hepatic negative GI ROS, Neg liver ROS,   Endo/Other  negative endocrine ROS  Renal/GU Renal disease  negative genitourinary   Musculoskeletal   Abdominal   Peds  Hematology  (+) Blood dyscrasia, anemia ,   Anesthesia Other Findings   Reproductive/Obstetrics negative OB ROS                            Anesthesia Physical Anesthesia Plan  ASA: 2  Anesthesia Plan: General   Post-op Pain Management:    Induction: Intravenous  PONV Risk Score and Plan: 4 or greater and Ondansetron, Dexamethasone and Midazolam  Airway Management Planned: LMA  Additional Equipment:   Intra-op Plan:   Post-operative Plan: Extubation in OR  Informed Consent: I have reviewed the patients History and Physical, chart, labs and discussed the procedure including the risks, benefits and alternatives for the proposed anesthesia with the patient or authorized representative who has indicated his/her understanding and acceptance.     Dental advisory given  Plan Discussed with: CRNA  Anesthesia Plan Comments:         Anesthesia Quick Evaluation

## 2021-03-06 ENCOUNTER — Encounter (HOSPITAL_COMMUNITY): Payer: Self-pay | Admitting: Urology

## 2021-03-06 LAB — URINE CULTURE: Culture: <10000 — AB

## 2021-03-06 LAB — HIV ANTIBODY (ROUTINE TESTING W REFLEX): HIV Screen 4th Generation wRfx: NONREACTIVE

## 2021-03-06 MED ORDER — OXYCODONE-ACETAMINOPHEN 5-325 MG PO TABS: 1.0000 | ORAL_TABLET | ORAL | 0 refills | Status: DC | PRN

## 2021-03-06 MED ORDER — TAMSULOSIN HCL 0.4 MG PO CAPS
0.4000 mg | ORAL_CAPSULE | Freq: Every day | ORAL | 0 refills | Status: DC
Start: 2021-03-06 — End: 2021-04-26

## 2021-03-06 MED ORDER — CEFPODOXIME PROXETIL 200 MG PO TABS: 200.0000 mg | ORAL_TABLET | Freq: Two times a day (BID) | ORAL | 0 refills | Status: DC

## 2021-03-06 NOTE — Discharge Summary (Signed)
Date of admission: 03/05/2021  Date of discharge: 03/06/2021  Admission diagnosis: right infected obstructing ureteral stone   Discharge diagnosis: right infected obstructing ureteral stone   Secondary diagnoses:  Patient Active Problem List   Diagnosis Date Noted   Ureteral stone with hydronephrosis 03/05/2021   Anxiety 10/06/2020   Obesity 10/06/2020   Ureteral calculus 12/12/2019   Renal tubular acidosis type I 06/24/2019   Hydronephrosis concurrent with and due to calculi of kidney and ureter 05/08/2019   Migraine without aura and without status migrainosus, not intractable 11/19/2013   Other malaise and fatigue 03/14/2013   Hypokalemia 12/26/2012   Infection of urinary tract 07/02/2012   Ureteral calculus, left 07/01/2012   Fever, unspecified 07/01/2012    Procedures performed: Procedure(s): CYSTOURETEROSCOPY WITH RETROGRADE PYELOGRAM/URETERAL STENT PLACEMENT RIGHT  History and Physical: For full details, please see admission history and physical. Briefly, Phyllis House is a 45 y.o. year old patient with history of nephrolithiasis who presented with right 69m obstructing ureteral stone with hydro and associated infection. She was admitted from the ED and urgently stented.   Hospital Course: Patient tolerated the procedure well.  She was then transferred to the floor after an uneventful PACU stay.  Her hospital course was uncomplicated. She had no fevers after stent placement and was subjectively feeling much improved. Her catheter was removed prior to discharge and she passed a trial of void. On POD#1 she had met discharge criteria: was eating a regular diet, was up and ambulating independently,  pain was well controlled, was voiding without a catheter, and was ready to for discharge.  She was discharged with a course of cefpodoxime while her final cultures are pending. She was instructed to call [facility] new fevers. She will follow up with Dr. EJunious Silk   Laboratory values:   Recent Labs    03/05/21 0450 03/05/21 1930  WBC 5.9 8.8  HGB 9.7* 9.4*  HCT 31.9* 31.1*   Recent Labs    03/05/21 0450 03/05/21 1930  NA 135 131*  K 3.3* 3.1*  CL 109 109  CO2 18* 16*  GLUCOSE 120* 120*  BUN 17 15  CREATININE 1.05* 0.99  CALCIUM 8.6* 8.1*   No results for input(s): LABPT, INR in the last 72 hours. No results for input(s): LABURIN in the last 72 hours. Results for orders placed or performed during the hospital encounter of 03/05/21  Resp Panel by RT-PCR (Flu A&B, Covid) Nasopharyngeal Swab     Status: None   Collection Time: 03/05/21  6:52 PM   Specimen: Nasopharyngeal Swab; Nasopharyngeal(NP) swabs in vial transport medium  Result Value Ref Range Status   SARS Coronavirus 2 by RT PCR NEGATIVE NEGATIVE Final    Comment: (NOTE) SARS-CoV-2 target nucleic acids are NOT DETECTED.  The SARS-CoV-2 RNA is generally detectable in upper respiratory specimens during the acute phase of infection. The lowest concentration of SARS-CoV-2 viral copies this assay can detect is 138 copies/mL. A negative result does not preclude SARS-Cov-2 infection and should not be used as the sole basis for treatment or other patient management decisions. A negative result may occur with  improper specimen collection/handling, submission of specimen other than nasopharyngeal swab, presence of viral mutation(s) within the areas targeted by this assay, and inadequate number of viral copies(<138 copies/mL). A negative result must be combined with clinical observations, patient history, and epidemiological information. The expected result is Negative.  Fact Sheet for Patients:  hEntrepreneurPulse.com.au Fact Sheet for Healthcare Providers:  hIncredibleEmployment.be This test  is no t yet approved or cleared by the [place] and  has been authorized for detection and/or diagnosis of SARS-CoV-2 by FDA under an Emergency Use Authorization  (EUA). This EUA will remain  in effect (meaning this test can be used) for the duration of the COVID-19 declaration under Section 564(b)(1) of the Act, 21 U.S.C.section 360bbb-3(b)(1), unless the authorization is terminated  or revoked sooner.       Influenza A by PCR NEGATIVE NEGATIVE Final   Influenza B by PCR NEGATIVE NEGATIVE Final    Comment: (NOTE) The Xpert Xpress SARS-CoV-2/FLU/RSV plus assay is intended as an aid in the diagnosis of influenza from Nasopharyngeal swab specimens and should not be used as a sole basis for treatment. Nasal washings and aspirates are unacceptable for Xpert Xpress SARS-CoV-2/FLU/RSV testing.  Fact Sheet for Patients: EntrepreneurPulse.com.au  Fact Sheet for Healthcare Providers: IncredibleEmployment.be  This test is not yet approved or cleared by the [place] FDA and has been authorized for detection and/or diagnosis of SARS-CoV-2 by FDA under an Emergency Use Authorization (EUA). This EUA will remain in effect (meaning this test can be used) for the duration of the COVID-19 declaration under Section 564(b)(1) of the Act, 21 U.S.C. section 360bbb-3(b)(1), unless the authorization is terminated or revoked.  Performed at [facility], [place] [address], [place], [place] [address]     Disposition: Home  Discharge instruction: The patient was instructed to be ambulatory but told to refrain from heavy lifting, strenuous activity, or driving.   Discharge medications:  Allergies as of 03/06/2021       Reactions   Adhesive [tape] Rash   Pt states paper tape causes rash, but "plastic tape" is not problematic.         Medication List     STOP taking these medications    acetaminophen 500 MG tablet Commonly known as: TYLENOL       TAKE these medications    cefpodoxime 200 MG tablet Commonly known as: VANTIN Take 1 tablet (200 mg total) by mouth 2 (two) times daily.    diclofenac 75 MG EC tablet Commonly known as: VOLTAREN Take 1 tablet (75 mg total) by mouth 2 (two) times daily.   levonorgestrel 20 MCG/24HR IUD Commonly known as: MIRENA 1 each by Intrauterine route once. Implanted November 2019   morphine 15 MG tablet Commonly known as: MSIR Take 0.5 tablets (7.5 mg total) by mouth every 4 (four) hours as needed for severe pain.   ondansetron 4 MG disintegrating tablet Commonly known as: Zofran ODT 61m ODT q4 hours prn nausea/vomit What changed:  how much to take how to take this when to take this reasons to take this additional instructions   oxyCODONE-acetaminophen 5-325 MG tablet Commonly known as: Percocet Take 1 tablet by mouth every 4 (four) hours as needed for severe pain.   potassium citrate 10 MEQ (1080 MG) SR tablet Commonly known as: UROCIT-K Take 10 mEq by mouth 2 (two) times daily with a meal.   promethazine 25 MG tablet Commonly known as: PHENERGAN Take 25 mg by mouth every 6 (six) hours as needed for nausea/vomiting.   tamsulosin 0.4 MG Caps capsule Commonly known as: FLOMAX Take 1 capsule (0.4 mg total) by mouth daily after supper. What changed: when to take this   tamsulosin 0.4 MG Caps capsule Commonly known as: FLOMAX Take 1 capsule (0.4 mg total) by mouth daily. What changed: You were already taking a medication with the same name, and  this prescription was added. Make sure you understand how and when to take each.        Followup:   Follow-up Information     Festus Aloe, MD. Call in 1 week(s).   Specialty: Urology Contact information: [place] [phone]

## 2021-03-07 LAB — URINE CULTURE

## 2021-03-10 LAB — CULTURE, BLOOD (ROUTINE X 2)
Culture: NO GROWTH
Culture: NO GROWTH
Special Requests: ADEQUATE
Special Requests: ADEQUATE

## 2021-03-21 ENCOUNTER — Encounter: Payer: Self-pay | Admitting: Podiatry

## 2021-03-21 ENCOUNTER — Ambulatory Visit (INDEPENDENT_AMBULATORY_CARE_PROVIDER_SITE_OTHER): Payer: BC Managed Care – PPO | Admitting: Podiatry

## 2021-03-21 ENCOUNTER — Other Ambulatory Visit: Payer: Self-pay

## 2021-03-21 DIAGNOSIS — M7662 Achilles tendinitis, left leg: Secondary | ICD-10-CM | POA: Diagnosis not present

## 2021-03-23 NOTE — Progress Notes (Signed)
Subjective:   Patient ID: Phyllis House, female   DOB: 45 y.o.   MRN: [id number]   HPI Patient states she has had improvement on the left and states that it still sore but nowhere near to the degree and the boot is been very helpful   ROS      Objective:  Physical Exam  Neurovascular status intact with patient's left posterior Achilles found to be improved with only mild discomfort no swelling noted currently and mild discomfort with palpation.  Patient does have equinus but no pain when I did test the muscle strength of the Achilles     Assessment:  Improvement of Achilles with pain still present but quite a bit better than previously     Plan:  H&P x-ray reviewed and I went ahead and recommended the continuation of immobilization for another 3 to 4 weeks with gradual reduction and the usage of heel lifts.  Patient will be seen back as symptoms indicate but I am hopeful this will be the end of the problems

## 2021-04-23 ENCOUNTER — Other Ambulatory Visit: Payer: Self-pay | Admitting: Urology

## 2021-06-29 ENCOUNTER — Other Ambulatory Visit: Payer: Self-pay | Admitting: Urology

## 2021-07-04 NOTE — Progress Notes (Addendum)
DUE TO COVID-19 ONLY ONE VISITOR IS ALLOWED TO COME WITH YOU AND STAY IN THE WAITING ROOM ONLY DURING PRE OP AND PROCEDURE DAY OF SURGERY. THE 1 VISITOR  MAY VISIT WITH YOU AFTER SURGERY IN YOUR PRIVATE ROOM DURING VISITING HOURS ONLY! ? ?  ?            Jasmene Goswami ? 07/04/2021 ? ? Your procedure is scheduled on:  ?             07/12/2021.   ? Report to [facility] Main  Entrance ? ? Report to admitting at   1145AM ?  ? ? Call this number if you have problems the morning of surgery [phone]  ? ? Remember: Do not eat food , candy gum or mints :After Midnight. You may have clear liquids from midnight until __ ? 1100am  ? ? ?CLEAR LIQUID DIET ? ? ?Foods Allowed                                                                      ? ?Coffee and tea, regular and decaf          no milk, cream or creamer                     ?Plain Jell-O any favor except red or purple                                            ?Fruit ices (not with fruit pulp)      no red                                  ?Iced Popsicles no red                                      ?                       ?White cranberry, white grape and apple juices  ?Sports drinks like Gatorade ?Lightly seasoned clear broth or consume(fat free) ( vegetable  chicken or beef )  ?Sugar ? ? ?_____________________________________________________________________ ?  ? BRUSH YOUR TEETH MORNING OF SURGERY AND RINSE YOUR MOUTH OUT, NO CHEWING GUM CANDY OR MINTS. ?  ? ? Take these medicines the morning of surgery with A SIP OF WATER:  flomax  ? ?DO NOT TAKE ANY DIABETIC MEDICATIONS DAY OF YOUR SURGERY ?                  ?            You may not have any metal on your body including hair pins and  ?            piercings  Do not wear jewelry, make-up, lotions, powders or perfumes, deodorant ?            Do not wear nail polish on your fingernails.  Do  not shave  48 hours prior to surgery.  ?            Men may shave face and neck. ? ? Do not bring valuables to the  hospital. [facility] IS NOT ?            RESPONSIBLE   FOR VALUABLES. ? Contacts, dentures or bridgework may not be worn into surgery. ? Leave suitcase in the car. After surgery it may be brought to your room. ? ?  ? Patients discharged the day of surgery will not be allowed to drive home. IF YOU ARE HAVING SURGERY AND GOING HOME THE SAME DAY, YOU MUST HAVE AN ADULT TO DRIVE YOU HOME AND BE WITH YOU FOR 24 HOURS. YOU MAY GO HOME BY TAXI OR UBER OR ORTHERWISE, BUT AN ADULT MUST ACCOMPANY YOU HOME AND STAY WITH YOU FOR 24 HOURS. ? Name and phone number of your driver: ? Special Instructions: N/A ? ?            Please read over the following fact sheets you were given: ?_____________________________________________________________________ ? ?[place] - Preparing for Surgery ?Before surgery, you can play an important role.  Because skin is not sterile, your skin needs to be as free of germs as possible.  You can reduce the number of germs on your skin by washing with CHG (chlorahexidine gluconate) soap before surgery.  CHG is an antiseptic cleaner which kills germs and bonds with the skin to continue killing germs even after washing. ?Please DO NOT use if you have an allergy to CHG or antibacterial soaps.  If your skin becomes reddened/irritated stop using the CHG and inform your nurse when you arrive at Short Stay. ?Do not shave (including legs and underarms) for at least 48 hours prior to the first CHG shower.  You may shave your face/neck. ?Please follow these instructions carefully: ? 1.  Shower with CHG Soap the night before surgery and the  morning of Surgery. ? 2.  If you choose to wash your hair, wash your hair first as usual with your  normal  shampoo. ? 3.  After you shampoo, rinse your hair and body thoroughly to remove the  shampoo.                           4.  Use CHG as you would any other liquid soap.  You can apply chg directly  to the skin and wash  ?                     Gently with a scrungie or  clean washcloth. ? 5.  Apply the CHG Soap to your body ONLY FROM THE NECK DOWN.   Do not use on face/ open      ?                     Wound or open sores. Avoid contact with eyes, ears mouth and genitals (private parts).  ?                     Engineering geologist,  Genitals (private parts) with your normal soap. ?            6.  Wash thoroughly, paying special attention to the area where your surgery  will be performed. ? 7.  Thoroughly rinse your body with warm water from the neck down. ? 8.  DO NOT shower/wash with your normal soap after using and rinsing off  the CHG Soap. ?               9.  Pat yourself dry with a clean towel. ?           10.  Wear clean pajamas. ?           11.  Place clean sheets on your bed the night of your first shower and do not  sleep with pets. ?Day of Surgery : ?Do not apply any lotions/deodorants the morning of surgery.  Please wear clean clothes to the hospital/surgery center. ? ?FAILURE TO FOLLOW THESE INSTRUCTIONS MAY RESULT IN THE CANCELLATION OF YOUR SURGERY ?PATIENT SIGNATURE_________________________________ ? ?NURSE SIGNATURE__________________________________ ? ?________________________________________________________________________  ? ?          ?

## 2021-07-04 NOTE — Progress Notes (Addendum)
Anesthesia Review: ? ?GYI:RSWNI [place]  ?Cardiologist : none  ?Chest x-ray : ?EKG : ?Echo : ?Stress test: ?Cardiac Cath :  ?Activity level: can do a flight of stairs without difficulty  ?Sleep Study/ CPAP : none  ?Fasting Blood Sugar :      / Checks Blood Sugar -- times a day:   ?Blood Thinner/ Instructions /Last Dose: ?ASA / Instructions/ Last Dose :   ?No covid test ambulkatory surgery  ?PT was 20 minutes late for preop appt.   ?PT unsure of name of medicaitons at preop.  PT aware pharmacy has tried to reach her.  PT given phone number andplaced on preop instrucitons for pt to call pharmacy.  Number of [phone] given to pt.  ?CBC and BMP  done 07/06/2021 routed to Dr Mena Goes. Jessica Ward , PAC made aware of white count and potassium results on 07/06/21. NO new orders given.  ?

## 2021-07-06 ENCOUNTER — Other Ambulatory Visit: Payer: Self-pay

## 2021-07-06 ENCOUNTER — Encounter (HOSPITAL_COMMUNITY)
Admission: RE | Admit: 2021-07-06 | Discharge: 2021-07-06 | Disposition: A | Discharge status: Home / Self Care | Payer: BC Managed Care – PPO | Source: Ambulatory Visit | Attending: Urology | Admitting: Urology

## 2021-07-06 ENCOUNTER — Encounter (HOSPITAL_COMMUNITY): Payer: Self-pay

## 2021-07-06 VITALS — BP 105/59 | HR 66 | Temp 98.4°F | Resp 16 | Ht 62.0 in | Wt 193.0 lb

## 2021-07-06 DIAGNOSIS — Z01812 Encounter for preprocedural laboratory examination: Secondary | ICD-10-CM | POA: Diagnosis not present

## 2021-07-06 DIAGNOSIS — N132 Hydronephrosis with renal and ureteral calculous obstruction: Secondary | ICD-10-CM | POA: Diagnosis not present

## 2021-07-06 HISTORY — DX: Other specified postprocedural states: R11.2

## 2021-07-06 HISTORY — DX: Other specified postprocedural states: Z98.890

## 2021-07-06 LAB — CBC
HCT: 32 % — ABNORMAL LOW (ref 36.0–46.0)
Hemoglobin: 9.6 g/dL — ABNORMAL LOW (ref 12.0–15.0)
MCH: 20.6 pg — ABNORMAL LOW (ref 26.0–34.0)
MCHC: 30 g/dL (ref 30.0–36.0)
MCV: 68.8 fL — ABNORMAL LOW (ref 80.0–100.0)
Platelets: 181 10*3/uL (ref 150–400)
RBC: 4.65 MIL/uL (ref 3.87–5.11)
RDW: 18.3 % — ABNORMAL HIGH (ref 11.5–15.5)
WBC: 2.5 10*3/uL — ABNORMAL LOW (ref 4.0–10.5)
nRBC: 0 % (ref 0.0–0.2)

## 2021-07-06 LAB — BASIC METABOLIC PANEL
Anion gap: 5 (ref 5–15)
BUN: 9 mg/dL (ref 6–20)
CO2: 16 mmol/L — ABNORMAL LOW (ref 22–32)
Calcium: 8.4 mg/dL — ABNORMAL LOW (ref 8.9–10.3)
Chloride: 115 mmol/L — ABNORMAL HIGH (ref 98–111)
Creatinine, Ser: 0.81 mg/dL (ref 0.44–1.00)
GFR, Estimated: >60 mL/min (ref >60)
Glucose, Bld: 116 mg/dL — ABNORMAL HIGH (ref 70–99)
Potassium: 3.3 mmol/L — ABNORMAL LOW (ref 3.5–5.1)
Sodium: 136 mmol/L (ref 135–145)

## 2021-07-11 NOTE — H&P (Signed)
Office Visit Report     06/17/2021   --------------------------------------------------------------------------------   Phyllis House  [id number]  DOB: 1975/07/27, 46 year old Female  SSN: [id number]   PRIMARY CARE:  MEDICAID [place] [facility] [place]  REFERRING:    PROVIDER:  Festus Aloe, M.D.  TREATING:  Daine Gravel, NP  LOCATION:  [facility] [phone]     --------------------------------------------------------------------------------   CC/HPI: F/u -   1) Long history of calcium phosphate stones.   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 Prior serum evaluation with PTH, calcium, uric acid and electrolytes was normal. Tried: HCTZ 25mg - induced symptomatic hypokalemia despite being on Kcitrate. Also, Kcit 20 meq BID caused GI upset. Repeat labs were done which included normal serum labs. 24-hour urine revealed very low volume, extreme hypocitraturia. Her urine Ca was normal. Urine pH 6.6 - 7.2. She saw [facility] and they are working with dietician. Needs another 24 hr urine. Seen Oct 2021. She is trying to drink more water and is taking K citrate (one morning and one evening) but she is only taking one per day and trying to "work up to it". Did another 24 hr urine 03/22 and meets with [facility].   Surgical -  Right URS Apr 2018 with Dr. Karsten Ro. She underwent bilateral ureteroscopy in 01/2018 (right staged). She was taken 08/21 for cysto Right URS/HLL/stent. 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 She underwent left ESWL 12/21 for a 6-7 mm left prox stone. [place] 12/21, benign with nephrocalcinosis noted bilaterally but no hydro.   She passed four or five stones - largest 6 - 7 mm. Not a lot of pain or hematuria. Much easier. Feeling better. [place] 03/22, looks good - nephrocalcinosis. No hydro.   She had right flank pain 07/22 and CT here with nephrocalcinosis bilaterally with  largest stone 7 mm on right. Pain worsened and scanned 4 days later 07/22 in ED and the right stone had passed into the right proximal ureter. I do think they are visible on the scout. She is drinking a lot of fluid and taking tamsulosin. She had "labor pains" when she voids. Pain manageable. Vitals stable.   Today, seen for the above.   Interval: 46 year old female who underwent right-sided lithotripsy for a 7 mm stone that had descended into her to distal right ureter. She reports that all of her pain has resolved. However, she has not seen a stone pass. And she has not passed any fragments. She denies gross hematuria, nausea, vomiting. She continues on potassium citrate and is tolerating this. She continues with increased hydration.   06/03/2021: 46 year old female who presents today with an onset of right-sided flank pain associated with gross hematuria and nausea and vomiting. This began about 5 hours ago and is progressively worsened. She denies fevers and chills. She endorses urinary frequency and urgency.   06/17/2021: Phyllis House presents today for follow-up after undergoing CT scan due to gross hematuria associated with nausea and vomiting. She was treated for infection and reports that her symptoms did improve but she is still seeing clots and blood in her urine. She denies fevers and chills. CT imaging did not show any obstruction.     ALLERGIES: No Allergies    MEDICATIONS: Advil  Aleve  Diclofenac Sodium 75 mg tablet, delayed release  Ibuprofen  Potassium Citrate Er 10 meq (1,080 mg) tablet, extended release  Tylenol     GU PSH: Cysto  Remove Stent FB Sim - 2020 Cysto Uretero Lithotripsy, Right - 2020, 2014 Cystoscopy - 02/03/2020 Cystoscopy And Treatment, Right - 2019, 2008 Cystoscopy Insert Stent, Right - 2021, Right - 2019, Left - 2019, Right - 2018, 2014, 2014 ESWL - 12/16/2020, Left - 08/07/2019 Ureteroscopic laser litho - 04/01/2021, Left - 04/08/2020, Right - 12/30/2019, Bilateral  - 2021, Right - 02/11/2019, Right - 2020, Bilateral - 2020, Left - 2019, Right - 2019, Right - 2018 Ureteroscopic stone removal, Right - 2018       PSH Notes: Cystoscopy With Ureteroscopy With Lithotripsy, Cystoscopy With Insertion Of Ureteral Stent Left, Cystoscopy With Insertion Of Ureteral Stent Left, Cystoscopy With Manipulation Of Ureteral Calculus   NON-GU PSH: No Non-GU PSH    GU PMH: Acute Cystitis/UTI - 06/03/2021, - 2020 Flank Pain - 06/03/2021, - 11/10/2020, - 02/03/2019, - 01/17/2019, - 12/24/2018, - 2020 Gross hematuria - 06/03/2021, This is likely upper tract or papilla bleeding from her last surgery. There is no hydro. If it persists would consider CT IVP or cysto/RGP/URS. , - 02/03/2020, - 2020 Renal calculus - 12/30/2020, stone burden continues to drastically improve on the CT. , - 12/01/2020, - 11/10/2020, She is well. Working with [facility]. We hope she'll have a time now where she doesn't need any procedures. Plan KUB and [place] in 6 mo. , - 07/21/2020 (Stable), She in 3 months with renal US. , - 04/28/2020, - 04/15/2020, - 04/01/2020, Renal US in 3 mo , - 02/03/2020, - 12/12/2019, KUB and [place] look good. See in 6 mo with [place]. , - 11/19/2019, - 10/30/2019, - 10/01/2019, - 08/21/2019, - 08/05/2019, - 2021, - 03/04/2019, - 2020, - 2020, - 2020, - 2020, - 2020, - 2018, Nephrolithiasis, - 2015 Ureteral calculus, I discussed with the patient the nature risks and benefits of continued stone passage, off label use of alpha blockers, shockwave lithotripsy or ureteroscopy. All questions answered. She really hopes to avoid URS/stent and I set up for right ESWL. - 12/01/2020, Ureteral Stone, - 2014 LLQ pain - 2020 RLQ pain - 2020 Mixed incontinence, Mixed stress and urge urinary incontinence - 2015 Urinary Tract Inf, Unspec site, Urinary tract infection - 2015, Pyuria, - 2015 Hydronephrosis Unspec, Hydronephrosis On The Right - 2014    NON-GU PMH: Bacteriuria - 2018 Encounter for general adult medical  examination without abnormal findings, Encounter for preventive health examination - 2015 Hypokalemia, Hypokalemia - 2015 Hypercalciuria, Hypercalciuria - 2014 Personal history of other diseases of the nervous system and sense organs, History of migraine headaches - 2014 Personal history of other specified conditions, History of nausea - 2014    FAMILY HISTORY: Family Health Status Number - Runs In Family Father Deceased At Age55 ___ - Runs In Family nephrolithiasis - Mother, Grandfather, Aunt, Uncle   SOCIAL HISTORY: Marital Status: Single Preferred Language: English; Ethnicity: Not Hispanic Or Latino; Race: White Current Smoking Status: Patient has never smoked.   Tobacco Use Assessment Completed: Used Tobacco in last 30 days? Does not use smokeless tobacco. Has never drank.  Does not use drugs. Drinks 3 caffeinated drinks per day. Has not had a blood transfusion. 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     Notes: Never A Smoker, Drug Use, Caffeine Use, Marital History - Single, Alcohol Use   REVIEW OF SYSTEMS:    GU Review Female:   Patient reports frequent urination and burning /pain with urination. Patient denies hard to postpone urination, get up at night to urinate, leakage of urine, stream starts  and stops, trouble starting your stream, have to strain to urinate, and being pregnant.  Gastrointestinal (Upper):   Patient denies nausea, vomiting, and indigestion/ heartburn.  Gastrointestinal (Lower):   Patient denies diarrhea and constipation.  Constitutional:   Patient denies night sweats, fatigue, fever, and weight loss.  Skin:   Patient denies skin rash/ lesion and itching.  Eyes:   Patient denies blurred vision and double vision.  Ears/ Nose/ Throat:   Patient denies sore throat and sinus problems.  Hematologic/Lymphatic:   Patient denies swollen glands and easy bruising.  Cardiovascular:   Patient denies leg swelling and chest pains.  Respiratory:   Patient denies  cough and shortness of breath.  Endocrine:   Patient denies excessive thirst.  Musculoskeletal:   Patient denies back pain and joint pain.  Neurological:   Patient denies headaches and dizziness.  Psychologic:   Patient denies depression and anxiety.   VITAL SIGNS:      06/17/2021 09:59 AM  BP 101/67 mmHg  Pulse 69 /min  Temperature 98.0 F / 36.6 C   MULTI-SYSTEM PHYSICAL EXAMINATION:    Constitutional: Well-nourished. No physical deformities. Normally developed. Good grooming.  Cardiovascular: Normal temperature, normal extremity pulses, no swelling, no varicosities.  Skin: No paleness, no jaundice, no cyanosis. No lesion, no ulcer, no rash.  Neurologic / Psychiatric: Oriented to time, oriented to place, oriented to person. No depression, no anxiety, no agitation.     Complexity of Data:  Source Of History:  Patient  Records Review:   Previous Doctor Records, Previous Patient Records  Urine Test Review:   Urinalysis, Urine Culture  X-Ray Review: [facility] Protocol: Reviewed Films. Reviewed Report. Discussed With Patient.     06/17/21  Urinalysis  Urine Appearance Cloudy   Urine Color Red   Urine Glucose Neg mg/dL  Urine Bilirubin Neg mg/dL  Urine Ketones Neg mg/dL  Urine Specific Gravity 1.020   Urine Blood 3+ ery/uL  Urine pH 7.0   Urine Protein 2+ mg/dL  Urine Urobilinogen 0.2 mg/dL  Urine Nitrites Neg   Urine Leukocyte Esterase 2+ leu/uL  Urine WBC/hpf 0 - 5/hpf   Urine RBC/hpf >60/hpf   Urine Epithelial Cells 0 - 5/hpf   Urine Bacteria Mod (26-50/hpf)   Urine Mucous Present   Urine Yeast NS (Not Seen)   Urine Trichomonas Not Present   Urine Cystals NS (Not Seen)   Urine Casts NS (Not Seen)   Urine Sperm Not Present    PROCEDURES:          Urinalysis w/Scope Dipstick Dipstick Cont'd Micro  Color: Red Bilirubin: Neg mg/dL WBC/hpf: 0 - 5/hpf  Appearance: Cloudy Ketones: Neg mg/dL RBC/hpf: >60/hpf  Specific Gravity: 1.020 Blood: 3+ ery/uL Bacteria: Mod  (26-50/hpf)  pH: 7.0 Protein: 2+ mg/dL Cystals: NS (Not Seen)  Glucose: Neg mg/dL Urobilinogen: 0.2 mg/dL Casts: NS (Not Seen)    Nitrites: Neg Trichomonas: Not Present    Leukocyte Esterase: 2+ leu/uL Mucous: Present      Epithelial Cells: 0 - 5/hpf      Yeast: NS (Not Seen)      Sperm: Not Present    Notes: Microscopic performed on unspun sample due to color/clarity.    ASSESSMENT:      ICD-10 Details  1 GU:   Gross hematuria - R31.0 Acute, Systemic Symptoms   PLAN:            Medications New Meds: Macrobid 100 mg capsule 1 capsule PO Q HS 1 tablet twice a day  for 7 days and then 1 tablet nightly until finished  #90  0 Refill(s)  Pharmacy Name:  [facility] [phone]  Address:  [place], [place] [id number]  Phone:  [phone]  Fax:  [phone]            Orders Labs CULTURE, URINE          Schedule Return Visit/Planned Activity: Next Available Appointment - Schedule Surgery          Document Letter(s):  Created for Patient: Clinical Summary         Notes:   Urine sent for culture today. We will start her on a course of Macrobid 100 mg twice a day for 7 days and then transition her to nightly. I discussed her presentation with her urologist and it was advised that she be taken for cystoscopy with bilateral retrograde pyelograms and diagnostic ureteroscopy. The surgical posting sheet was placed today. I discussed this along with the risks of the procedure with the patient. She was agreeable to the plan. I advised strict return precautions in the interval.    * Signed by Daine Gravel, NP on 06/17/21 at 5:19 PM (EST)*      The information contained in this medical record document is considered private and confidential patient information. This information can only be used for the medical diagnosis and/or medical services that are being provided by the patient's selected caregivers. This information can only be distributed outside of the  patient's care if the patient agrees and signs waivers of authorization for this information to be sent to an outside source or route.  Add; Urine cx negative. On NF.

## 2021-07-12 ENCOUNTER — Ambulatory Visit (HOSPITAL_COMMUNITY): Payer: BC Managed Care – PPO | Admitting: Physician Assistant

## 2021-07-12 ENCOUNTER — Ambulatory Visit (HOSPITAL_COMMUNITY): Payer: BC Managed Care – PPO

## 2021-07-12 ENCOUNTER — Encounter (HOSPITAL_COMMUNITY): Payer: Self-pay | Admitting: Urology

## 2021-07-12 ENCOUNTER — Encounter (HOSPITAL_COMMUNITY)
Admission: RE | Disposition: A | Discharge status: Home / Self Care | Payer: Self-pay | Source: Ambulatory Visit | Attending: Urology

## 2021-07-12 ENCOUNTER — Ambulatory Visit (HOSPITAL_COMMUNITY): Payer: BC Managed Care – PPO | Admitting: Anesthesiology

## 2021-07-12 ENCOUNTER — Ambulatory Visit (HOSPITAL_COMMUNITY)
Admission: RE | Admit: 2021-07-12 | Discharge: 2021-07-12 | Disposition: A | Discharge status: Home / Self Care | Payer: BC Managed Care – PPO | Source: Ambulatory Visit | Attending: Urology | Admitting: Urology

## 2021-07-12 DIAGNOSIS — N200 Calculus of kidney: Secondary | ICD-10-CM

## 2021-07-12 DIAGNOSIS — E669 Obesity, unspecified: Secondary | ICD-10-CM | POA: Diagnosis not present

## 2021-07-12 DIAGNOSIS — N29 Other disorders of kidney and ureter in diseases classified elsewhere: Secondary | ICD-10-CM | POA: Insufficient documentation

## 2021-07-12 DIAGNOSIS — E876 Hypokalemia: Secondary | ICD-10-CM | POA: Insufficient documentation

## 2021-07-12 DIAGNOSIS — N132 Hydronephrosis with renal and ureteral calculous obstruction: Secondary | ICD-10-CM | POA: Insufficient documentation

## 2021-07-12 DIAGNOSIS — Z01818 Encounter for other preprocedural examination: Secondary | ICD-10-CM

## 2021-07-12 LAB — PREGNANCY, URINE: Preg Test, Ur: NEGATIVE

## 2021-07-12 SURGERY — CYSTOURETEROSCOPY, WITH STENT INSERTION
Anesthesia: General | Laterality: Right

## 2021-07-12 MED ORDER — SODIUM CHLORIDE 0.9 % IR SOLN
Status: DC | PRN
  Administered 2021-07-12: 3000 mL

## 2021-07-12 MED ORDER — CIPROFLOXACIN IN D5W 400 MG/200ML IV SOLN
400.0000 mg | Freq: Once | INTRAVENOUS | Status: AC
  Administered 2021-07-12: 400 mg via INTRAVENOUS
  Filled 2021-07-12: qty 200

## 2021-07-12 MED ORDER — ORAL CARE MOUTH RINSE: 15.0000 mL | Freq: Once | OROMUCOSAL | Status: AC

## 2021-07-12 MED ORDER — ONDANSETRON HCL 4 MG/2ML IJ SOLN
INTRAMUSCULAR | Status: AC
  Filled 2021-07-12: qty 2

## 2021-07-12 MED ORDER — KETOROLAC TROMETHAMINE 30 MG/ML IJ SOLN
INTRAMUSCULAR | Status: DC | PRN
  Administered 2021-07-12: 10 mg via INTRAVENOUS

## 2021-07-12 MED ORDER — OXYCODONE HCL 5 MG PO TABS: 5.0000 mg | ORAL_TABLET | Freq: Once | ORAL | Status: DC | PRN

## 2021-07-12 MED ORDER — ONDANSETRON HCL 4 MG/2ML IJ SOLN: 4.0000 mg | Freq: Once | INTRAMUSCULAR | Status: DC | PRN

## 2021-07-12 MED ORDER — FENTANYL CITRATE (PF) 100 MCG/2ML IJ SOLN
INTRAMUSCULAR | Status: AC
  Filled 2021-07-12: qty 2

## 2021-07-12 MED ORDER — DEXAMETHASONE SODIUM PHOSPHATE 10 MG/ML IJ SOLN
INTRAMUSCULAR | Status: DC | PRN
  Administered 2021-07-12: 10 mg via INTRAVENOUS

## 2021-07-12 MED ORDER — DEXAMETHASONE SODIUM PHOSPHATE 10 MG/ML IJ SOLN
INTRAMUSCULAR | Status: AC
  Filled 2021-07-12: qty 1

## 2021-07-12 MED ORDER — HYDROMORPHONE HCL 1 MG/ML IJ SOLN: 0.2500 mg | INTRAMUSCULAR | Status: DC | PRN

## 2021-07-12 MED ORDER — MIDAZOLAM HCL 5 MG/5ML IJ SOLN
INTRAMUSCULAR | Status: DC | PRN
Start: 2021-07-12 — End: 2021-07-12
  Administered 2021-07-12: 2 mg via INTRAVENOUS

## 2021-07-12 MED ORDER — OXYCODONE HCL 5 MG/5ML PO SOLN: 5.0000 mg | Freq: Once | ORAL | Status: DC | PRN

## 2021-07-12 MED ORDER — LIDOCAINE 2% (20 MG/ML) 5 ML SYRINGE
INTRAMUSCULAR | Status: DC | PRN
  Administered 2021-07-12: 100 mg via INTRAVENOUS

## 2021-07-12 MED ORDER — PROPOFOL 10 MG/ML IV BOLUS
INTRAVENOUS | Status: DC | PRN
  Administered 2021-07-12: 200 mg via INTRAVENOUS

## 2021-07-12 MED ORDER — HYDROCODONE-ACETAMINOPHEN 5-325 MG PO TABS: 1.0000 | ORAL_TABLET | Freq: Four times a day (QID) | ORAL | 0 refills | Status: DC | PRN

## 2021-07-12 MED ORDER — ONDANSETRON HCL 4 MG/2ML IJ SOLN
INTRAMUSCULAR | Status: DC | PRN
  Administered 2021-07-12: 4 mg via INTRAVENOUS

## 2021-07-12 MED ORDER — IOHEXOL 300 MG/ML  SOLN
INTRAMUSCULAR | Status: DC | PRN
  Administered 2021-07-12: 13 mL

## 2021-07-12 MED ORDER — MIDAZOLAM HCL 2 MG/2ML IJ SOLN
INTRAMUSCULAR | Status: AC
  Filled 2021-07-12: qty 2

## 2021-07-12 MED ORDER — FUROSEMIDE 10 MG/ML IJ SOLN
INTRAMUSCULAR | Status: DC | PRN
  Administered 2021-07-12: 10 mg via INTRAMUSCULAR

## 2021-07-12 MED ORDER — ACETAMINOPHEN 500 MG PO TABS
1000.0000 mg | ORAL_TABLET | Freq: Once | ORAL | Status: AC
  Administered 2021-07-12: 1000 mg via ORAL
  Filled 2021-07-12: qty 2

## 2021-07-12 MED ORDER — SCOPOLAMINE 1 MG/3DAYS TD PT72
1.0000 | MEDICATED_PATCH | TRANSDERMAL | Status: DC
  Administered 2021-07-12: 1.5 mg via TRANSDERMAL
  Filled 2021-07-12: qty 1

## 2021-07-12 MED ORDER — LACTATED RINGERS IV SOLN: INTRAVENOUS | Status: DC

## 2021-07-12 MED ORDER — FENTANYL CITRATE (PF) 100 MCG/2ML IJ SOLN
INTRAMUSCULAR | Status: DC | PRN
Start: 2021-07-12 — End: 2021-07-12
  Administered 2021-07-12: 50 ug via INTRAVENOUS

## 2021-07-12 MED ORDER — FUROSEMIDE 10 MG/ML IJ SOLN
INTRAMUSCULAR | Status: AC
  Filled 2021-07-12: qty 2

## 2021-07-12 MED ORDER — GABAPENTIN 300 MG PO CAPS
300.0000 mg | ORAL_CAPSULE | Freq: Once | ORAL | Status: AC
  Administered 2021-07-12: 300 mg via ORAL
  Filled 2021-07-12: qty 1

## 2021-07-12 MED ORDER — PROPOFOL 10 MG/ML IV BOLUS
INTRAVENOUS | Status: AC
  Filled 2021-07-12: qty 20

## 2021-07-12 MED ORDER — CHLORHEXIDINE GLUCONATE 0.12 % MT SOLN
15.0000 mL | Freq: Once | OROMUCOSAL | Status: AC
  Administered 2021-07-12: 15 mL via OROMUCOSAL

## 2021-07-12 MED ORDER — KETOROLAC TROMETHAMINE 30 MG/ML IJ SOLN
INTRAMUSCULAR | Status: AC
  Filled 2021-07-12: qty 1

## 2021-07-12 SURGICAL SUPPLY — 25 items
BAG URO CATCHER STRL LF (MISCELLANEOUS) ×3 IMPLANT
BASKET ZERO TIP NITINOL 2.4FR (BASKET) IMPLANT
BSKT STON RTRVL ZERO TP 2.4FR (BASKET)
CATH URET 5FR 28IN CONE TIP (BALLOONS)
CATH URET 5FR 70CM CONE TIP (BALLOONS) IMPLANT
CATH URETL OPEN END 6FR 70 (CATHETERS) ×3 IMPLANT
CLOTH BEACON ORANGE TIMEOUT ST (SAFETY) ×3 IMPLANT
DRSG TEGADERM 2-3/8X2-3/4 SM (GAUZE/BANDAGES/DRESSINGS) ×1 IMPLANT
EXTRACTOR STONE 1.7FRX115CM (UROLOGICAL SUPPLIES) ×1 IMPLANT
GLOVE SURG ENC MOIS LTX SZ7.5 (GLOVE) ×3 IMPLANT
GOWN STRL REUS W/ TWL XL LVL3 (GOWN DISPOSABLE) ×2 IMPLANT
GOWN STRL REUS W/TWL XL LVL3 (GOWN DISPOSABLE) ×2
GUIDEWIRE STR DUAL SENSOR (WIRE) ×2 IMPLANT
GUIDEWIRE ZIPWRE .038 STRAIGHT (WIRE) ×1 IMPLANT
KIT TURNOVER KIT A (KITS) IMPLANT
LASER FIB FLEXIVA PULSE ID 365 (Laser) IMPLANT
MANIFOLD NEPTUNE II (INSTRUMENTS) ×2 IMPLANT
PACK CYSTO (CUSTOM PROCEDURE TRAY) ×2 IMPLANT
SHEATH NAVIGATOR HD 11/13X28 (SHEATH) IMPLANT
SHEATH NAVIGATOR HD 11/13X36 (SHEATH) IMPLANT
STENT URET 6FRX24 CONTOUR (STENTS) ×1 IMPLANT
TRACTIP FLEXIVA PULS ID 200XHI (Laser) IMPLANT
TRACTIP FLEXIVA PULSE ID 200 (Laser)
TUBING CONNECTING 10 (TUBING) ×2 IMPLANT
TUBING UROLOGY SET (TUBING) ×3 IMPLANT

## 2021-07-12 NOTE — Interval H&P Note (Signed)
History and Physical Interval Note: ? ?07/12/2021 ?2:16 PM ? ?Phyllis House  has presented today for surgery, with the diagnosis of GROSS HEMATURIA.  The various methods of treatment have been discussed with the patient and family. After consideration of risks, benefits and other options for treatment, the patient has consented to  Procedure(s): ?CYSTOSCOPY WITH URETEROSCOPY AND STENT PLACEMENT/ POSSIBLE BIOPSY (Bilateral) as a surgical intervention.  The patient's history has been reviewed, patient examined, no change in status, stable for surgery.  I have reviewed the patient's chart and labs.  Phyllis House has been having she reports left and right flank pain.  She has had episodes of gross hematuria.  She mainly has right-sided pain.  She reports since we saw her last she passed 2 small stones.  She has not had any further dysuria or gross hematuria.  She does have some achy bilateral flank pain which seems chronic.  I told her I was not too concerned about the CT scan in fact there was no hydro and I thought her stone burden compared to her prior studies showed an excellent improvement and almost resolution.  Which she likely has is her typical nephrocalcinosis and small pieces or stones passing.  I suggested canceling today.  She would really like for me to take a look.  Therefore I told her we plan to take a look in the bladder, watch for efflux from each side, certainly perform ureteroscopy if there was any bleeding from one or both sides.  Also do retrogrades and watch some of the drainage.  If anything looks to be out of the ordinary we can take a look with the scope.  We discussed she may need bilateral stents.  All questions answered.  She elects to proceed.     ? ? ?Phyllis House ? ? ?

## 2021-07-12 NOTE — Discharge Instructions (Signed)
Remove the stent as instructed on Friday morning, July 15, 2021. ?

## 2021-07-12 NOTE — Anesthesia Preprocedure Evaluation (Signed)
Anesthesia Evaluation  ?Patient identified by MRN, date of birth, ID band ?Patient awake ? ? ? ?Reviewed: ?Allergy & Precautions, NPO status , Patient's Chart, lab work & pertinent test results, reviewed documented beta blocker date and time  ? ?History of Anesthesia Complications ?(+) PONV and history of anesthetic complications ? ?Airway ?Mallampati: II ? ?TM Distance: >3 FB ?Neck ROM: Full ? ? ? Dental ?no notable dental hx. ?(+) Teeth Intact, Dental Advisory Given ?  ?Pulmonary ?pneumonia, resolved,  ?  ?Pulmonary exam normal ?breath sounds clear to auscultation ? ? ? ? ? ? Cardiovascular ?Normal cardiovascular exam ?Rhythm:Regular Rate:Normal ? ? ?  ?Neuro/Psych ? Headaches, PSYCHIATRIC DISORDERS Anxiety Depression   ? GI/Hepatic ?  ?Endo/Other  ?Obesity ? Renal/GU ?Renal diseaseHx/o RTA type I ?Bilateral renal calculi  ?negative genitourinary ?  ?Musculoskeletal ?negative musculoskeletal ROS ?(+)  ? Abdominal ?(+) + obese,   ?Peds ? Hematology ? ?(+) Blood dyscrasia, anemia ,   ?Anesthesia Other Findings ? ? Reproductive/Obstetrics ?negative OB ROS ? ?  ? ? ? ? ? ? ? ? ? ? ? ? ? ?  ?  ? ? ? ? ? ? ? ? ?Anesthesia Physical ?Anesthesia Plan ? ?ASA: 2 ? ?Anesthesia Plan: General  ? ?Post-op Pain Management:   ? ?Induction: Intravenous ? ?PONV Risk Score and Plan: 4 or greater and Treatment may vary due to age or medical condition, Scopolamine patch - Pre-op, Midazolam, Dexamethasone and Ondansetron ? ?Airway Management Planned: LMA ? ?Additional Equipment: None ? ?Intra-op Plan:  ? ?Post-operative Plan: Extubation in OR ? ?Informed Consent: I have reviewed the patients History and Physical, chart, labs and discussed the procedure including the risks, benefits and alternatives for the proposed anesthesia with the patient or authorized representative who has indicated his/her understanding and acceptance.  ? ? ? ?Dental advisory given ? ?Plan Discussed with: CRNA and  Anesthesiologist ? ?Anesthesia Plan Comments:   ? ? ? ? ? ? ?Anesthesia Quick Evaluation ? ?

## 2021-07-12 NOTE — Op Note (Signed)
Preoperative diagnosis: Nephrocalcinosis, flank pain, gross hematuria ? ?Postoperative diagnosis: Right renal stone, flank pain ? ?Procedure: Cystoscopy, bilateral retrograde pyelogram, right ureteroscopy, stone basket extraction, right ureteral stent placement ? ?Surgeon: Mena Goes ? ?Anesthesia: General ? ?Indication for procedure: Phyllis House is a 46 year old female with a history of nephrocalcinosis and multiple stone passage.  She has had been having some colicky right greater than left flank pain and gross hematuria over the past few weeks.  CT scan looked good as far as her stone burden was minimal.  She is brought today for investigative procedure after she presented to the office several times. ? ?Findings: On exam the vulva appeared normal, meatus appeared normal.  Bladder and urethra palpably normal.  On cystoscopy the urethra and bladder were unremarkable.  She has a good bladder capacity.  No mucosal lesions.  No stone or foreign body in the bladder.  The ureteral orifice ease were in their normal orthotopic position.  The trigone appeared normal.  There was clear effflux bilaterally. ? ?Right retrograde pyelogram-this outlined a single ureter single collecting system unit without filling defect, stricture or dilation.  However after observing for 5 to 10 minutes the right kidney swelled with mild hydroureteronephrosis down to the pelvic brim therefore diagnostic ureteroscopy was undertaken to rule out stone or stricture. ? ?Left retrograde pyelogram-this outlined a single ureter single collecting system unit without filling defect, stricture or dilation.  There was good drainage over 5 to 10 minutes after Lasix. ? ?Right ureteroscopy-ureter was completely normal and wide open.  There was no stricture or stone in the ureter.  I was easily able to pass the semirigid up to and through the right UPJ.  There is some angulation or kinking at the right UPJ but it is widely patent.  Because she had a wire and I  passed the scope I switched out to the digital to inspect the collecting system for any notable stones and only found 1 small stone in the right mid calyx which was grasped and removed intact.  Otherwise nephrocalcinosis as expected with no other stones noted.  No mucosal lesions of the collecting system or ureter. ? ?Description of procedure: After consent was obtained patient brought to the operating room.  After adequate anesthesia she was placed in lithotomy position and prepped and draped in the usual sterile fashion.  Timeout was performed to confirm the patient and procedure.  Cystoscope was passed per urethra.  Had anesthesia give her 10 mg of IV Lasix.  She had left greater than right E flux.  Efflux was clear.  The right ureteral orifice was cannulated with 6 [place] open-ended catheter and retrograde injection of contrast was performed.  The left ureteral orifice was cannulated with a 6 [place] open-ended catheter and retrograde injection of contrast was performed.  Scout imaging was obtained to inspect for drainage.  Good contrast efflux from both sides.  After 5 to 10 minutes scout imaging revealed that contrast had built up in the right system down to the pelvic brim with the appearance of mild hydroureteronephrosis.  Image was saved.  I then took the semirigid ureteroscope and advanced that along the wire went up into the renal pelvis without any difficulty.  No stricture or stone of the ureter.  No narrowing of the ureter.  I then passed a Glidewire and backed the semirigid out.  A digital scope was passed up into the right collecting system and each calyx was inspected.  There was no significant stone burden.  I was able to pass the digital in and out through the right UPJ without difficulty oral in the need of a wire.  One small stone was located in the midpole calyx it was grasped with an engage basket and removed intact on the way out I could see the ureter and UPJ which again noted to be normal  without any injury.  The wire was backloaded on the cystoscope and a 6 x 24 cm stent advanced.  The wire was removed with a good coil seen in the kidney and a good coil in the bladder.  We will leave the stent for a few days.  The string was left on the stent.  She was awakened taken the cover room in stable condition. ? ?Complications: None ? ?Blood loss: Minimal ? ?Specimens: Stone to office lab ? ?Drains: 6 x 24 cm right ureteral stent with string ? ?Disposition: Patient stable to PACU ?

## 2021-07-12 NOTE — Anesthesia Postprocedure Evaluation (Signed)
Anesthesia Post Note ? ?Patient: Phyllis House ? ?Procedure(s) Performed: CYSTOSCOPY WITH RIGHT URETEROSCOPY AND STENT PLACEMENT (Right) ? ?  ? ?Patient location during evaluation: PACU ?Anesthesia Type: General ?Level of consciousness: awake and alert ?Pain management: pain level controlled ?Vital Signs Assessment: post-procedure vital signs reviewed and stable ?Respiratory status: spontaneous breathing, nonlabored ventilation and respiratory function stable ?Cardiovascular status: blood pressure returned to baseline and stable ?Postop Assessment: no apparent nausea or vomiting ?Anesthetic complications: no ? ? ?No notable events documented. ? ?Last Vitals:  ?Vitals:  ? 07/12/21 1545 07/12/21 1600  ?BP:  128/87  ?Pulse: 67   ?Resp: 18   ?Temp:    ?SpO2: 99%   ?  ?Last Pain:  ?Vitals:  ? 07/12/21 1545  ?TempSrc:   ?PainSc: 0-No pain  ? ? ?  ?  ?  ?  ?  ?  ? ?Lucretia Kern ? ? ? ? ?

## 2021-07-12 NOTE — OR Nursing (Signed)
Stone taken by Dr. Eskridge ?

## 2021-07-12 NOTE — Anesthesia Procedure Notes (Signed)
Procedure Name: LMA Insertion ?Date/Time: 07/12/2021 2:21 PM ?Performed by: Bonney Aid, CRNA ?Pre-anesthesia Checklist: Patient identified, Emergency Drugs available, Suction available and Patient being monitored ?Patient Re-evaluated:Patient Re-evaluated prior to induction ?Oxygen Delivery Method: Circle system utilized ?Preoxygenation: Pre-oxygenation with 100% oxygen ?Induction Type: IV induction ?Ventilation: Mask ventilation without difficulty ?LMA: LMA inserted ?LMA Size: 4.0 ?Number of attempts: 1 ?Airway Equipment and Method: Bite block ?Placement Confirmation: positive ETCO2 ?Tube secured with: Tape ?Dental Injury: Teeth and Oropharynx as per pre-operative assessment  ? ? ? ? ?

## 2021-07-12 NOTE — Transfer of Care (Signed)
Immediate Anesthesia Transfer of Care Note ? ?Patient: Phyllis House ? ?Procedure(s) Performed: CYSTOSCOPY WITH RIGHT URETEROSCOPY AND STENT PLACEMENT (Right) ? ?Patient Location: PACU ? ?Anesthesia Type:General ? ?Level of Consciousness: sedated ? ?Airway & Oxygen Therapy: Patient Spontanous Breathing and Patient connected to face mask oxygen ? ?Post-op Assessment: Report given to RN and Post -op Vital signs reviewed and stable ? ?Post vital signs: Reviewed and stable ? ?Last Vitals:  ?Vitals Value Taken Time  ?BP    ?Temp    ?Pulse 59 07/12/21 1523  ?Resp 12 07/12/21 1523  ?SpO2 100 % 07/12/21 1523  ?Vitals shown include unvalidated device data. ? ?Last Pain:  ?Vitals:  ? 07/12/21 1107  ?TempSrc:   ?PainSc: 0-No pain  ?   ? ?  ? ?Complications: No notable events documented. ?

## 2021-07-13 ENCOUNTER — Encounter (HOSPITAL_COMMUNITY): Payer: Self-pay | Admitting: Urology

## 2021-11-01 ENCOUNTER — Emergency Department (HOSPITAL_COMMUNITY): Payer: BC Managed Care – PPO

## 2021-11-01 ENCOUNTER — Emergency Department (HOSPITAL_COMMUNITY)
Admission: EM | Admit: 2021-11-01 | Discharge: 2021-11-01 | Disposition: A | Discharge status: Home / Self Care | Payer: BC Managed Care – PPO | Attending: Emergency Medicine | Admitting: Emergency Medicine

## 2021-11-01 ENCOUNTER — Encounter (HOSPITAL_COMMUNITY): Payer: Self-pay

## 2021-11-01 DIAGNOSIS — R310 Gross hematuria: Secondary | ICD-10-CM

## 2021-11-01 DIAGNOSIS — E876 Hypokalemia: Secondary | ICD-10-CM

## 2021-11-01 DIAGNOSIS — R109 Unspecified abdominal pain: Secondary | ICD-10-CM | POA: Diagnosis present

## 2021-11-01 DIAGNOSIS — D649 Anemia, unspecified: Secondary | ICD-10-CM | POA: Diagnosis not present

## 2021-11-01 LAB — COMPREHENSIVE METABOLIC PANEL
ALT: 15 U/L (ref 0–44)
AST: 18 U/L (ref 15–41)
Albumin: 4.2 g/dL (ref 3.5–5.0)
Alkaline Phosphatase: 49 U/L (ref 38–126)
Anion gap: 5 (ref 5–15)
BUN: 12 mg/dL (ref 6–20)
CO2: 14 mmol/L — ABNORMAL LOW (ref 22–32)
Calcium: 8.3 mg/dL — ABNORMAL LOW (ref 8.9–10.3)
Chloride: 117 mmol/L — ABNORMAL HIGH (ref 98–111)
Creatinine, Ser: 1.03 mg/dL — ABNORMAL HIGH (ref 0.44–1.00)
GFR, Estimated: >60 mL/min (ref >60)
Glucose, Bld: 99 mg/dL (ref 70–99)
Potassium: 2.9 mmol/L — ABNORMAL LOW (ref 3.5–5.1)
Sodium: 136 mmol/L (ref 135–145)
Total Bilirubin: 0.5 mg/dL (ref 0.3–1.2)
Total Protein: 7.2 g/dL (ref 6.5–8.1)

## 2021-11-01 LAB — URINALYSIS, ROUTINE W REFLEX MICROSCOPIC: RBC / HPF: >50 RBC/hpf — ABNORMAL HIGH (ref 0–5)

## 2021-11-01 LAB — CBC WITH DIFFERENTIAL/PLATELET
Abs Immature Granulocytes: 0.01 10*3/uL (ref 0.00–0.07)
Basophils Absolute: 0.1 10*3/uL (ref 0.0–0.1)
Basophils Relative: 2 %
Eosinophils Absolute: 0 10*3/uL (ref 0.0–0.5)
Eosinophils Relative: 1 %
HCT: 25.4 % — ABNORMAL LOW (ref 36.0–46.0)
Hemoglobin: 7.3 g/dL — ABNORMAL LOW (ref 12.0–15.0)
Immature Granulocytes: 0 %
Lymphocytes Relative: 20 %
Lymphs Abs: 0.8 10*3/uL (ref 0.7–4.0)
MCH: 18.5 pg — ABNORMAL LOW (ref 26.0–34.0)
MCHC: 28.7 g/dL — ABNORMAL LOW (ref 30.0–36.0)
MCV: 64.5 fL — ABNORMAL LOW (ref 80.0–100.0)
Monocytes Absolute: 0.3 10*3/uL (ref 0.1–1.0)
Monocytes Relative: 8 %
Neutro Abs: 2.9 10*3/uL (ref 1.7–7.7)
Neutrophils Relative %: 69 %
Platelets: 164 10*3/uL (ref 150–400)
RBC: 3.94 MIL/uL (ref 3.87–5.11)
RDW: 18.9 % — ABNORMAL HIGH (ref 11.5–15.5)
WBC: 4.2 10*3/uL (ref 4.0–10.5)
nRBC: 0 % (ref 0.0–0.2)

## 2021-11-01 LAB — PREGNANCY, URINE: Preg Test, Ur: NEGATIVE

## 2021-11-01 MED ORDER — KETOROLAC TROMETHAMINE 30 MG/ML IJ SOLN
30.0000 mg | Freq: Once | INTRAMUSCULAR | Status: AC
  Administered 2021-11-01: 30 mg via INTRAVENOUS
  Filled 2021-11-01: qty 1

## 2021-11-01 MED ORDER — HYDROMORPHONE HCL 1 MG/ML IJ SOLN
1.0000 mg | Freq: Once | INTRAMUSCULAR | Status: AC
  Administered 2021-11-01: 1 mg via INTRAVENOUS
  Filled 2021-11-01: qty 1

## 2021-11-01 MED ORDER — ONDANSETRON 4 MG PO TBDP: 4.0000 mg | ORAL_TABLET | Freq: Three times a day (TID) | ORAL | 0 refills | Status: AC | PRN

## 2021-11-01 MED ORDER — SENNOSIDES-DOCUSATE SODIUM 8.6-50 MG PO TABS: 1.0000 | ORAL_TABLET | Freq: Every day | ORAL | 0 refills | Status: AC

## 2021-11-01 MED ORDER — POTASSIUM CHLORIDE CRYS ER 20 MEQ PO TBCR: 40.0000 meq | EXTENDED_RELEASE_TABLET | Freq: Every day | ORAL | 0 refills | Status: DC

## 2021-11-01 MED ORDER — FERROUS SULFATE 325 (65 FE) MG PO TABS: 325.0000 mg | ORAL_TABLET | Freq: Every day | ORAL | 0 refills | Status: DC

## 2021-11-01 MED ORDER — ONDANSETRON HCL 4 MG/2ML IJ SOLN
4.0000 mg | Freq: Once | INTRAMUSCULAR | Status: AC
Start: 2021-11-01 — End: 2021-11-01
  Administered 2021-11-01: 4 mg via INTRAVENOUS
  Filled 2021-11-01: qty 2

## 2021-11-01 MED ORDER — OXYCODONE-ACETAMINOPHEN 5-325 MG PO TABS: 1.0000 | ORAL_TABLET | Freq: Four times a day (QID) | ORAL | 0 refills | Status: DC | PRN

## 2021-11-01 NOTE — ED Triage Notes (Signed)
Pt arrived via POV, c/o right sided flank pain. Pain also in left side earlier today. Hx of kidney stones, states pain feels similar.

## 2021-11-01 NOTE — ED Provider Notes (Signed)
Emergency Department Provider Note   I have reviewed the triage vital signs and the nursing notes.   HISTORY  Chief Complaint Flank Pain   HPI Allizon Woznick is a 46 y.o. female with Gizelle Whetsel history of ureteral colic and kidney stones followed by [facility] who presents with return of right flank pain similar to her prior renal colic.  She had more mild discomfort over the past couple of weeks that has passed.  No fevers or chills.  No dysuria, hesitancy, urgency.  She has noticed blood in the urine.  Her pain is significantly worsened in the past 24 hours ultimately prompting her to seek ED treatment.  Denies pain into the chest or shortness of breath.   Past Medical History:  Diagnosis Date   Anxiety    Depression    Eczema    H/O varicella    Hematuria    History of herpes genitalis    History of kidney stones    pt has Christepher Melchior hx calcium phosphate stones   Hypocalcemia    Pneumonia    once 10+ yeaers ago   Polycystic ovarian syndrome    PONV (postoperative nausea and vomiting)    Renal tubular acidosis type I    Right ureteral stone    Seasonal allergies    Urgency of urination     Review of Systems  Constitutional: No fever/chills Eyes: No visual changes. ENT: No sore throat. Cardiovascular: Denies chest pain. Respiratory: Denies shortness of breath. Gastrointestinal: Positive right flank/abdominal pain.  Positive nausea and vomiting.  No diarrhea.  No constipation. Genitourinary: Negative for dysuria. Positive hematuria.  Musculoskeletal: Negative for back pain. Skin: Negative for rash. Neurological: Negative for headaches, focal weakness or numbness.   ____________________________________________   PHYSICAL EXAM:  VITAL SIGNS: ED Triage Vitals [11/01/21 1904]  Enc Vitals Group     BP (!) 142/87     Pulse Rate 83     Resp 18     Temp 99.3 F (37.4 C)     Temp Source Oral     SpO2 98 %   Constitutional: Alert and oriented. Patient  standing and leaning over at the bedside. Appears uncomfortable but able to provide a full history.  Eyes: Conjunctivae are normal.  Head: Atraumatic. Nose: No congestion/rhinnorhea. Mouth/Throat: Mucous membranes are moist.  Neck: No stridor. Cardiovascular: Normal rate, regular rhythm. Good peripheral circulation. Grossly normal heart sounds.   Respiratory: Normal respiratory effort.  No retractions. Lungs CTAB. Gastrointestinal: Soft and nontender. No distention.  Musculoskeletal: No lower extremity tenderness nor edema. No gross deformities of extremities. Neurologic:  Normal speech and language. No gross focal neurologic deficits are appreciated.  Skin:  Skin is warm, dry and intact. No rash noted.  ____________________________________________   LABS (all labs ordered are listed, but only abnormal results are displayed)  Labs Reviewed  COMPREHENSIVE METABOLIC PANEL - Abnormal; Notable for the following components:      Result Value   Potassium 2.9 (*)    Chloride 117 (*)    CO2 14 (*)    Creatinine, Ser 1.03 (*)    Calcium 8.3 (*)    All other components within normal limits  CBC WITH DIFFERENTIAL/PLATELET - Abnormal; Notable for the following components:   Hemoglobin 7.3 (*)    HCT 25.4 (*)    MCV 64.5 (*)    MCH 18.5 (*)    MCHC 28.7 (*)    RDW 18.9 (*)    All other components within normal limits  URINALYSIS, ROUTINE W REFLEX MICROSCOPIC - Abnormal; Notable for the following components:   Color, Urine RED (*)    APPearance HAZY (*)    Glucose, UA   (*)    Value: TEST NOT REPORTED DUE TO COLOR INTERFERENCE OF URINE PIGMENT   Hgb urine dipstick   (*)    Value: TEST NOT REPORTED DUE TO COLOR INTERFERENCE OF URINE PIGMENT   Bilirubin Urine   (*)    Value: TEST NOT REPORTED DUE TO COLOR INTERFERENCE OF URINE PIGMENT   Ketones, ur   (*)    Value: TEST NOT REPORTED DUE TO COLOR INTERFERENCE OF URINE PIGMENT   Protein, ur   (*)    Value: TEST NOT REPORTED DUE TO COLOR  INTERFERENCE OF URINE PIGMENT   Nitrite   (*)    Value: TEST NOT REPORTED DUE TO COLOR INTERFERENCE OF URINE PIGMENT   Leukocytes,Ua   (*)    Value: TEST NOT REPORTED DUE TO COLOR INTERFERENCE OF URINE PIGMENT   RBC / HPF >50 (*)    Bacteria, UA RARE (*)    All other components within normal limits  URINE CULTURE  PREGNANCY, URINE   ____________________________________________  RADIOLOGY  US Renal  Result Date: 11/01/2021 CLINICAL DATA:  Right flank pain. EXAM: RENAL / URINARY TRACT ULTRASOUND COMPLETE COMPARISON:  Radiograph earlier today.  CT 06/03/2021 FINDINGS: Right Kidney: Renal measurements: 11.4 x 6.1 x 5.4 cm = volume: 194 mL. Moderate to advanced hydronephrosis. 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 No focal renal lesion. Left Kidney: Renal measurements: 12.2 x 5.8 x 5.5 cm = volume: 205 mL. Moderate hydronephrosis. Increased echogenicity of the renal pyramids likely representing medullary calcinosis when compared with prior CT. There also additional shadowing calcifications consistent with intrarenal stones. Measured calcifications are greater than 1 cm by the technologist likely represent conglomerate of stones, of stones are small on prior imaging. Bladder: Partially distended. Both ureteral jets are visualized. No definite bladder stone. Other: None. IMPRESSION: 1. Moderate to advanced right and moderate left hydronephrosis. 2. 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 3. Both ureteral jets are visualized, no cause for obstruction is seen by ultrasound. Electronically Signed   By: Narda Rutherford M.D.   On: 11/01/2021 21:13   DG Abdomen 1 View  Result Date: 11/01/2021 CLINICAL DATA:  Right flank pain.  History of kidney stones. EXAM: ABDOMEN - 1 VIEW COMPARISON:  CT 06/03/2020 FINDINGS: Bilateral intrarenal calculi as  well as findings suggestive of medullary nephrocalcinosis. There is a 6 mm calcification to the left of L2-L3, separate 6 mm calcification to the left of L4. These may represent ureteral stones, no phleboliths or soft tissue calcifications were seen on prior CT. Bilateral pelvic calcifications correspond to phleboliths on CT. No definite calcifications over the course of the right ureter. Normal bowel gas pattern. IUD in the pelvis. IMPRESSION: 1. Bilateral intrarenal calculi and findings suggestive of medullary nephrocalcinosis. 2. Two calcifications to the left of L2-L3 and L4 may represent ureteral stones. No calcifications projecting over the right ureters. Electronically Signed   By: Narda Rutherford M.D.   On: 11/01/2021 19:47    ____________________________________________   PROCEDURES  Procedure(s) performed:   Procedures  None  ____________________________________________   INITIAL IMPRESSION / ASSESSMENT AND PLAN / ED COURSE  Pertinent labs & imaging results that were available during my care of the patient were reviewed  by me and considered in my medical decision making (see chart for details).   This patient is Presenting for Evaluation of abdominal pain, which does require a range of treatment options, and is a complaint that involves a high risk of morbidity and mortality.  The Differential Diagnoses includes but is not exclusive to ectopic pregnancy, ovarian cyst, ovarian torsion, acute appendicitis, urinary tract infection, endometriosis, bowel obstruction, hernia, colitis, renal colic, gastroenteritis, volvulus etc.   Critical Interventions-    Medications  ondansetron (ZOFRAN) injection 4 mg (4 mg Intravenous Given 11/01/21 1951)  ketorolac (TORADOL) 30 MG/ML injection 30 mg (30 mg Intravenous Given 11/01/21 1953)  HYDROmorphone (DILAUDID) injection 1 mg (1 mg Intravenous Given 11/01/21 2109)    Reassessment after intervention: Pain significantly improved.    I did  obtain Additional Historical Information from [facility] at bedside.  I decided to review pertinent External Data, and in summary patient with multiple prior ED visits and cystoscopy in the past with multiple prior abdominal CT scans.   Clinical Laboratory Tests Ordered, included mild hypokalemia at 2.9.  No acute kidney injury or gap acidosis.  Hemoglobin is downtrending down to 7.3 from the 9 range.  MCV is low.  Pregnancy test negative.  Gross hematuria on UA make interpreting the UA difficult.  We will send this for culture.   Radiologic Tests Ordered, included renal US and KUB. I independently interpreted the images and agree with radiology interpretation.   Cardiac Monitor Tracing which shows NSR.   Social Determinants of Health Risk patient is a non-smoker.   Medical Decision Making: Summary:  Patient presents emergency department for evaluation of right flank pain typical of her renal colic.  She has had multiple interventions and calcium stones in the past.  Notes this feels similar.  No fevers or chills.  Considered CT imaging but given her history plan to defer and will obtain renal ultrasound and KUB with plan for symptom management and UA review.  Reevaluation with update and discussion with patient.  She is feeling much better.  We discussed her lab abnormalities including anemia and hypokalemia.  We will supplement iron and potassium.  Called and Percocet to the pharmacy after review of the [employer].  Patient's pain is well controlled here.  Urine sent for culture.  She will follow with her PCP for repeat blood work in the coming week as well as urology for follow-up given her recurrent renal colic and hematuria.   Considered admission but symptoms are significantly improved and patient has established care with urology as an outpatient.  She will have repeat blood work done in the coming week with her PCP.  Discussed tricked ED return precautions.  Disposition:  discharge  ____________________________________________  FINAL CLINICAL IMPRESSION(S) / ED DIAGNOSES  Final diagnoses:  Right flank pain  Gross hematuria  Anemia, unspecified type  Hypokalemia     NEW OUTPATIENT MEDICATIONS STARTED DURING THIS VISIT:  New Prescriptions   FERROUS SULFATE 325 (65 FE) MG TABLET    Take 1 tablet (325 mg total) by mouth daily.   ONDANSETRON (ZOFRAN-ODT) 4 MG DISINTEGRATING TABLET    Take 1 tablet (4 mg total) by mouth every 8 (eight) hours as needed for nausea or vomiting.   OXYCODONE-ACETAMINOPHEN (PERCOCET/ROXICET) 5-325 MG TABLET    Take 1 tablet by mouth every 6 (six) hours as needed for severe pain.   POTASSIUM CHLORIDE SA (KLOR-CON M) 20 MEQ TABLET    Take 2 tablets (40 mEq total) by  mouth daily for 5 days.   SENNA-DOCUSATE (SENOKOT-S) 8.6-50 MG TABLET    Take 1 tablet by mouth daily.    Note:  This document was prepared using Dragon voice recognition software and may include unintentional dictation errors.  Alona Bene, MD, [facility]    Annamay Laymon, Arlyss Repress, MD 11/01/21 2200

## 2021-11-01 NOTE — Discharge Instructions (Signed)
You were seen in the emergency room today with flank discomfort.  We are treating you for possible passage of kidney stones.  I have called in some pain medication to the pharmacy which can cause constipation and affect your ability to drive a car.  Do not take this with other pain medicines or with alcohol.  Your hemoglobin or red blood cell counts have drifted lower than prior labs.  These need to be rechecked by your primary care doctor in the next week.  If they continue to drift downward you could need a blood transfusion.  Have called in some iron supplements to your pharmacy.  Your potassium was also slightly low and I have called in some supplements to help with this.  These labs also need to be repeated.   Please call the urology group to schedule a follow-up appointment.  Return to the emergency department with worsening pain, bleeding, inability to urinate, or fever.

## 2021-11-02 LAB — URINE CULTURE: Culture: <10000 — AB

## 2021-11-16 ENCOUNTER — Encounter (HOSPITAL_COMMUNITY): Payer: Self-pay

## 2021-11-16 ENCOUNTER — Emergency Department (HOSPITAL_COMMUNITY)
Admission: EM | Admit: 2021-11-16 | Discharge: 2021-11-16 | Disposition: A | Discharge status: Home / Self Care | Payer: BC Managed Care – PPO | Attending: Emergency Medicine | Admitting: Emergency Medicine

## 2021-11-16 ENCOUNTER — Emergency Department (HOSPITAL_COMMUNITY): Payer: BC Managed Care – PPO

## 2021-11-16 DIAGNOSIS — R5383 Other fatigue: Secondary | ICD-10-CM | POA: Diagnosis present

## 2021-11-16 DIAGNOSIS — D72819 Decreased white blood cell count, unspecified: Secondary | ICD-10-CM | POA: Diagnosis not present

## 2021-11-16 DIAGNOSIS — E876 Hypokalemia: Secondary | ICD-10-CM | POA: Diagnosis not present

## 2021-11-16 DIAGNOSIS — D649 Anemia, unspecified: Secondary | ICD-10-CM | POA: Diagnosis not present

## 2021-11-16 DIAGNOSIS — R0602 Shortness of breath: Secondary | ICD-10-CM | POA: Insufficient documentation

## 2021-11-16 LAB — IRON AND TIBC
Iron: 16 ug/dL — ABNORMAL LOW (ref 28–170)
Saturation Ratios: 4 % — ABNORMAL LOW (ref 10.4–31.8)
TIBC: 452 ug/dL — ABNORMAL HIGH (ref 250–450)
UIBC: 436 ug/dL

## 2021-11-16 LAB — BASIC METABOLIC PANEL
Anion gap: 5 (ref 5–15)
BUN: 11 mg/dL (ref 6–20)
CO2: 15 mmol/L — ABNORMAL LOW (ref 22–32)
Calcium: 8.6 mg/dL — ABNORMAL LOW (ref 8.9–10.3)
Chloride: 119 mmol/L — ABNORMAL HIGH (ref 98–111)
Creatinine, Ser: 0.87 mg/dL (ref 0.44–1.00)
GFR, Estimated: >60 mL/min (ref >60)
Glucose, Bld: 94 mg/dL (ref 70–99)
Potassium: 2.9 mmol/L — ABNORMAL LOW (ref 3.5–5.1)
Sodium: 139 mmol/L (ref 135–145)

## 2021-11-16 LAB — CBC WITH DIFFERENTIAL/PLATELET
Abs Immature Granulocytes: 0.01 10*3/uL (ref 0.00–0.07)
Basophils Absolute: 0.1 10*3/uL (ref 0.0–0.1)
Basophils Relative: 2 %
Eosinophils Absolute: 0.1 10*3/uL (ref 0.0–0.5)
Eosinophils Relative: 3 %
HCT: 27.8 % — ABNORMAL LOW (ref 36.0–46.0)
Hemoglobin: 7.6 g/dL — ABNORMAL LOW (ref 12.0–15.0)
Immature Granulocytes: 0 %
Lymphocytes Relative: 35 %
Lymphs Abs: 1 10*3/uL (ref 0.7–4.0)
MCH: 18.4 pg — ABNORMAL LOW (ref 26.0–34.0)
MCHC: 27.3 g/dL — ABNORMAL LOW (ref 30.0–36.0)
MCV: 67.1 fL — ABNORMAL LOW (ref 80.0–100.0)
Monocytes Absolute: 0.3 10*3/uL (ref 0.1–1.0)
Monocytes Relative: 9 %
Neutro Abs: 1.4 10*3/uL — ABNORMAL LOW (ref 1.7–7.7)
Neutrophils Relative %: 51 %
Platelets: 172 10*3/uL (ref 150–400)
RBC: 4.14 MIL/uL (ref 3.87–5.11)
RDW: 21 % — ABNORMAL HIGH (ref 11.5–15.5)
WBC: 2.7 10*3/uL — ABNORMAL LOW (ref 4.0–10.5)
nRBC: 0 % (ref 0.0–0.2)

## 2021-11-16 LAB — VITAMIN B12: Vitamin B-12: 152 pg/mL — ABNORMAL LOW (ref 180–914)

## 2021-11-16 LAB — FERRITIN: Ferritin: 4 ng/mL — ABNORMAL LOW (ref 11–307)

## 2021-11-16 LAB — RETICULOCYTES
Immature Retic Fract: 31.3 % — ABNORMAL HIGH (ref 2.3–15.9)
RBC.: 4.15 MIL/uL (ref 3.87–5.11)
Retic Count, Absolute: 119.1 10*3/uL (ref 19.0–186.0)
Retic Ct Pct: 2.9 % (ref 0.4–3.1)

## 2021-11-16 LAB — PROTIME-INR
INR: 1.1 (ref 0.8–1.2)
Prothrombin Time: 14.2 seconds (ref 11.4–15.2)

## 2021-11-16 LAB — PREPARE RBC (CROSSMATCH)

## 2021-11-16 LAB — FOLATE: Folate: 11.4 ng/mL (ref >5.9)

## 2021-11-16 LAB — BRAIN NATRIURETIC PEPTIDE: B Natriuretic Peptide: 31.3 pg/mL (ref 0.0–100.0)

## 2021-11-16 MED ORDER — POTASSIUM CHLORIDE 10 MEQ/100ML IV SOLN
10.0000 meq | INTRAVENOUS | Status: AC
  Administered 2021-11-16 (×2): 10 meq via INTRAVENOUS
  Filled 2021-11-16 (×2): qty 100

## 2021-11-16 MED ORDER — SODIUM CHLORIDE 0.9 % IV SOLN
10.0000 mL/h | Freq: Once | INTRAVENOUS | Status: AC
  Administered 2021-11-16: 10 mL/h via INTRAVENOUS

## 2021-11-16 MED ORDER — POTASSIUM CHLORIDE CRYS ER 20 MEQ PO TBCR
40.0000 meq | EXTENDED_RELEASE_TABLET | Freq: Once | ORAL | Status: AC
  Administered 2021-11-16: 40 meq via ORAL
  Filled 2021-11-16: qty 2

## 2021-11-16 NOTE — ED Notes (Signed)
Pt A&OX4 ambulatory at d/c with independent steady gait, NAD. Pt verbalized understanding of d/c instructions and follow up care. ?

## 2021-11-16 NOTE — ED Triage Notes (Signed)
Pt states she had a follow up with her Urologist yesterday and her hgb was 7.7. Pt reports associated sob, dizziness, fatigue and headaches.

## 2021-11-16 NOTE — ED Provider Notes (Signed)
[facility] DEPT Provider Note   CSN: [id number] Arrival date & time: 11/16/21  1103     History PMH: Migraine, PCOS, recurrent kidney stones, renal tubular acidosis Chief Complaint  Patient presents with   Abnormal Lab   Shortness of Breath   Dizziness    Phyllis House is a 46 y.o. female. Presents the emergency department on behalf of of her urologist.  She says ever since March she has had continuous hematuria.  She has had multiple kidney stones and recently passed 3 stones.  She was seen in the emergency department 2 weeks ago and was found to have a worsening hemoglobin from 9.6-7.3.  She said prior to this, she had started to develop some worsening shortness of breath with exertion, fatigue, some dizziness when she stands up, and headaches that have been worsening over the past several months.  She was seen by her urologist yesterday who repeated her labs and she was found to have a hemoglobin of 7.7.  This was not an acute drop from her value 2 weeks ago, but given that she is having persistent and worsening symptoms, he sent her here to get a blood transfusion.  Abnormal Lab Shortness of Breath Associated symptoms: headaches   Associated symptoms: no abdominal pain, no chest pain, no cough, no fever and no vomiting   Dizziness Associated symptoms: headaches and shortness of breath   Associated symptoms: no chest pain, no nausea, no vomiting and no weakness        Home Medications Prior to Admission medications   Medication Sig Start Date End Date Taking? Authorizing Provider  diclofenac Sodium (VOLTAREN) 1 % GEL Apply 1 application. topically 4 (four) times daily as needed (pain).    [provider]  ferrous sulfate 325 (65 FE) MG tablet Take 1 tablet (325 mg total) by mouth daily. 11/01/21   Long, Wonda Olds, MD  HYDROcodone-acetaminophen (NORCO/VICODIN) 5-325 MG tablet Take 1 tablet by mouth every 6 (six) hours as needed. 07/12/21    Festus Aloe, MD  levonorgestrel (MIRENA) 20 MCG/24HR IUD 1 each by Intrauterine route once. Implanted November 2019    [provider]  nitrofurantoin, macrocrystal-monohydrate, (MACROBID) 100 MG capsule Take 100 mg by mouth at bedtime. 06/17/21   [provider]  ondansetron (ZOFRAN-ODT) 4 MG disintegrating tablet Take 1 tablet (4 mg total) by mouth every 8 (eight) hours as needed for nausea or vomiting. 11/01/21   Long, Wonda Olds, MD  oxyCODONE-acetaminophen (PERCOCET/ROXICET) 5-325 MG tablet Take 1 tablet by mouth every 6 (six) hours as needed for severe pain. 11/01/21   Long, Wonda Olds, MD  potassium chloride SA (KLOR-CON M) 20 MEQ tablet Take 2 tablets (40 mEq total) by mouth daily for 5 days. 11/01/21 11/06/21  Long, Wonda Olds, MD  potassium citrate (UROCIT-K) 10 MEQ (1080 MG) SR tablet Take 10 mEq by mouth 2 (two) times daily with a meal.    [provider]  promethazine (PHENERGAN) 25 MG tablet Take 25 mg by mouth every 6 (six) hours as needed for nausea or vomiting.    [provider]  senna-docusate (SENOKOT-S) 8.6-50 MG tablet Take 1 tablet by mouth daily. 11/01/21   Long, Wonda Olds, MD      Allergies    Adhesive [tape]    Review of Systems   Review of Systems  Constitutional:  Positive for fatigue. Negative for chills and fever.  Respiratory:  Positive for shortness of breath. Negative for cough and chest tightness.  Cardiovascular:  Negative for chest pain.  Gastrointestinal:  Negative for abdominal pain, nausea and vomiting.  Genitourinary:  Negative for dysuria and flank pain.  Neurological:  Positive for dizziness and headaches. Negative for weakness.  All other systems reviewed and are negative.   Physical Exam Updated Vital Signs BP 104/63   Pulse 64   Temp 98 F (36.7 C) (Oral)   Resp 14   Ht 5' 2"  (1.575 m)   Wt 81.6 kg   SpO2 100%   BMI 32.92 kg/m  Physical Exam Vitals and nursing note reviewed.  Constitutional:      General:  She is not in acute distress.    Appearance: Normal appearance. She is not ill-appearing, toxic-appearing or diaphoretic.  HENT:     Head: Normocephalic and atraumatic.     Nose: No nasal deformity.     Mouth/Throat:     Lips: Pink. No lesions.     Mouth: Mucous membranes are moist. No injury, lacerations, oral lesions or angioedema.     Pharynx: Oropharynx is clear. Uvula midline. No pharyngeal swelling, oropharyngeal exudate, posterior oropharyngeal erythema or uvula swelling.  Eyes:     General: Gaze aligned appropriately. No scleral icterus.       Right eye: No discharge.        Left eye: No discharge.     Conjunctiva/sclera: Conjunctivae normal.     Right eye: Right conjunctiva is not injected. No exudate or hemorrhage.    Left eye: Left conjunctiva is not injected. No exudate or hemorrhage. Neck:     Vascular: No JVD.     Trachea: No tracheal deviation.  Cardiovascular:     Rate and Rhythm: Normal rate and regular rhythm.     Pulses: Normal pulses.          Radial pulses are 2+ on the right side and 2+ on the left side.       Dorsalis pedis pulses are 2+ on the right side and 2+ on the left side.     Heart sounds: Normal heart sounds, S1 normal and S2 normal. Heart sounds not distant. No murmur heard.    No friction rub. No gallop. No S3 or S4 sounds.  Pulmonary:     Effort: Pulmonary effort is normal. No accessory muscle usage or respiratory distress.     Breath sounds: Normal breath sounds. No stridor. No wheezing, rhonchi or rales.  Chest:     Chest wall: No tenderness.  Abdominal:     General: Abdomen is flat. Bowel sounds are normal. There is no distension.     Palpations: Abdomen is soft. There is no mass or pulsatile mass.     Tenderness: There is no abdominal tenderness. There is no guarding or rebound.  Musculoskeletal:     Right lower leg: No tenderness. No edema.     Left lower leg: No tenderness. No edema.  Skin:    General: Skin is warm and dry.      Coloration: Skin is not jaundiced or pale.     Findings: No bruising, erythema, lesion or rash.  Neurological:     General: No focal deficit present.     Mental Status: She is alert and oriented to person, place, and time.     GCS: GCS eye subscore is 4. GCS verbal subscore is 5. GCS motor subscore is 6.  Psychiatric:        Mood and Affect: Mood normal.        Behavior: Behavior  normal. Behavior is cooperative.     ED Results / Procedures / Treatments   Labs (all labs ordered are listed, but only abnormal results are displayed) Labs Reviewed  BASIC METABOLIC PANEL - Abnormal; Notable for the following components:      Result Value   Potassium 2.9 (*)    Chloride 119 (*)    CO2 15 (*)    Calcium 8.6 (*)    All other components within normal limits  VITAMIN B12 - Abnormal; Notable for the following components:   Vitamin B-12 152 (*)    All other components within normal limits  IRON AND TIBC - Abnormal; Notable for the following components:   Iron 16 (*)    TIBC 452 (*)    Saturation Ratios 4 (*)    All other components within normal limits  FERRITIN - Abnormal; Notable for the following components:   Ferritin 4 (*)    All other components within normal limits  RETICULOCYTES - Abnormal; Notable for the following components:   Immature Retic Fract 31.3 (*)    All other components within normal limits  CBC WITH DIFFERENTIAL/PLATELET - Abnormal; Notable for the following components:   WBC 2.7 (*)    Hemoglobin 7.6 (*)    HCT 27.8 (*)    MCV 67.1 (*)    MCH 18.4 (*)    MCHC 27.3 (*)    RDW 21.0 (*)    Neutro Abs 1.4 (*)    All other components within normal limits  BRAIN NATRIURETIC PEPTIDE  FOLATE  PROTIME-INR  TYPE AND SCREEN  PREPARE RBC (CROSSMATCH)    EKG None  Radiology DG Chest 2 View  Result Date: 11/16/2021 CLINICAL DATA:  shortness of breath, low hemoglobin, dizziness, fatigue and headaches EXAM: CHEST - 2 VIEW COMPARISON:  July 01, 2012 FINDINGS: The  heart size and mediastinal contours are within normal limits. Both lungs are clear and stable. The visualized skeletal structures are unremarkable. IMPRESSION: No active cardiopulmonary disease. Electronically Signed   By: Frazier Richards M.D.   On: 11/16/2021 11:57    Procedures Procedures  This patient was on telemetry or cardiac monitoring during their time in the ED.    Medications Ordered in ED Medications  potassium chloride 10 mEq in 100 mL IVPB (10 mEq Intravenous New Bag/Given 11/16/21 1531)  0.9 %  sodium chloride infusion (10 mL/hr Intravenous New Bag/Given 11/16/21 1420)  potassium chloride SA (KLOR-CON M) CR tablet 40 mEq (40 mEq Oral Given 11/16/21 1421)    ED Course/ Medical Decision Making/ A&P Clinical Course as of 11/16/21 1535  Wed Nov 16, 2021  1331 Hgb 7.6 and still stable. No other cause identified to explain shortness of breath. Will give 1 unit of pRBC. Replacing potassium here as well [GL]    Clinical Course User Index [GL] Dajon Lazar, Adora Fridge, PA-C                           Medical Decision Making Amount and/or Complexity of Data Reviewed Labs: ordered. Radiology: ordered.  Risk Prescription drug management.    MDM  This is a 46 y.o. female who presents to the ED with worsening shortness of breath and concern for anemia and need for possible blood transfusion. The differential of this patient includes but is not limited to PE, CHF, kidney disease, Anemia, etc.  Initial Impression  Well appearing. Stable vitals.  I reviewed prior hemoglobins and it is stable from 2  weeks ago.  It does appear though the patient has been symptomatic for a while likely from her anemia.  We will repeat labs as well as obtain anemia panel. We will also evaluate for other causes of shortness of breath.  I do have low suspicion for pulmonary embolism as she is very low risk and symptoms do not seem consistent with this.  She has no evidence of hypervolemia to suggest CHF or kidney  issues causing this problem.  I personally ordered, reviewed, and interpreted all laboratory work and imaging and agree with radiologist interpretation. Results interpreted below:  CBC shows a white blood cell count of 2.7 which has been apparent in the past.  Hemoglobin is stable at 7.6.  It is microcytic.  Anemia panel is notable for decreased ferritin of 4, decreased vitamin B12 at 152, iron is 16, TIBC is elevated at 452. Folate normal. Ret count absolute normal BMP notable for hypokalemia of 2.9 Coags normal BNP normal CXR without abnormality EKG NSR  Assessment/Plan:  Anemia is stable from what it was two weeks ago. Likely 2/2 to blood loss. Iron and B12 are also very low. Does not seem destructive.  No other causes to shortness of breath are found Will give 1 unit of pRBC here. I anticipate she will be discharged though as she is stable.    3:35 PM Care of Phyllis House transferred to [facility] and Dr. Vanita Panda at the end of my shift as the patient will require reassessment once labs/imaging have resulted. Patient presentation, ED course, and plan of care discussed with review of all pertinent labs and imaging. Please see his/her note for further details regarding further ED course and disposition. Plan at time of handoff is reassess after transfusion. Anticipate discharge. This may be altered or completely changed at the discretion of the oncoming team pending results of further workup.    Charting Requirements Additional history is obtained from:  Independent historian External Records from outside source obtained and reviewed including: prior labs Social Determinants of Health:  none Pertinant PMH that complicates patient's illness: Frequent kidney stones with hematuria  Patient Care Problems that were addressed during this visit: - Anemia: Acute illness with systemic symptoms This patient was maintained on a cardiac monitor/telemetry. I personally viewed and interpreted the  cardiac monitor which reveals an underlying rhythm of NSR Medications given in ED: Kcl, pRBC Reevaluation of the patient after these medicines showed that the patient stayed the same I have reviewed home medications and made changes accordingly.  Critical Care Interventions: n/a Consultations: n/a Disposition: anticipate discharge. See oncoming provider note  This is a supervised visit with my attending physician, Dr. Zenia Resides. We have discussed this patient and they have altered the plan as needed.  Portions of this note were generated with [employer]. Dictation errors may occur despite best attempts at proofreading.      Final Clinical Impression(s) / ED Diagnoses Final diagnoses:  Symptomatic anemia    Rx / DC Orders ED Discharge Orders     None         Adolphus Birchwood, PA-C 11/16/21 1535    Lacretia Leigh, MD 11/16/21 1554

## 2021-11-16 NOTE — Discharge Instructions (Signed)
Please follow up with Urology and your PCP for repeat evaluation and labs.

## 2021-11-16 NOTE — ED Provider Notes (Signed)
  Physical Exam  BP 130/84   Pulse 64   Temp 98.8 F (37.1 C) (Oral)   Resp 20   Ht 5\' 2"  (1.575 m)   Wt 81.6 kg   SpO2 100%   BMI 32.92 kg/m   Physical Exam Vitals and nursing note reviewed.     Procedures  Procedures  ED Course / MDM   Clinical Course as of 11/16/21 1848  Wed Nov 16, 2021  1331 Hgb 7.6 and still stable. No other cause identified to explain shortness of breath. Will give 1 unit of pRBC. Replacing potassium here as well [GL]    Clinical Course User Index [GL] Loeffler, Nov 18, 2021, PA-C   Medical Decision Making Amount and/or Complexity of Data Reviewed Labs: ordered. Radiology: ordered.  Risk Prescription drug management.   Patient received blood transfusion.  Per prior provider Finis Bud L.  PA-C patient to be discharged.  Patient without any reaction.  Vitals are within normal limits.  She is hemodynamically stable for discharge.       Delorise Shiner, PA-C 11/16/21 1849    11/18/21, MD 11/16/21 1950

## 2021-11-17 LAB — TYPE AND SCREEN
ABO/RH(D): O POS
Antibody Screen: NEGATIVE
Unit division: 0

## 2021-11-17 LAB — BPAM RBC
Blood Product Expiration Date: 202308192359
ISSUE DATE / TIME: 202307191402
Unit Type and Rh: 5100

## 2021-12-05 ENCOUNTER — Other Ambulatory Visit: Payer: Self-pay | Admitting: Obstetrics & Gynecology

## 2021-12-05 DIAGNOSIS — R928 Other abnormal and inconclusive findings on diagnostic imaging of breast: Secondary | ICD-10-CM

## 2021-12-19 ENCOUNTER — Ambulatory Visit
Admission: RE | Admit: 2021-12-19 | Discharge: 2021-12-19 | Disposition: A | Discharge status: Home / Self Care | Payer: BC Managed Care – PPO | Source: Ambulatory Visit | Attending: Obstetrics & Gynecology | Admitting: Obstetrics & Gynecology

## 2021-12-19 ENCOUNTER — Other Ambulatory Visit: Payer: Self-pay | Admitting: Obstetrics & Gynecology

## 2021-12-19 DIAGNOSIS — R928 Other abnormal and inconclusive findings on diagnostic imaging of breast: Secondary | ICD-10-CM

## 2021-12-19 DIAGNOSIS — N6489 Other specified disorders of breast: Secondary | ICD-10-CM

## 2022-01-09 ENCOUNTER — Other Ambulatory Visit: Payer: Self-pay

## 2022-01-09 ENCOUNTER — Emergency Department (HOSPITAL_COMMUNITY)
Admission: EM | Admit: 2022-01-09 | Discharge: 2022-01-10 | Disposition: A | Discharge status: Home / Self Care | Payer: BC Managed Care – PPO | Attending: Emergency Medicine | Admitting: Emergency Medicine

## 2022-01-09 ENCOUNTER — Encounter (HOSPITAL_COMMUNITY): Payer: Self-pay

## 2022-01-09 DIAGNOSIS — R319 Hematuria, unspecified: Secondary | ICD-10-CM | POA: Diagnosis present

## 2022-01-09 DIAGNOSIS — D649 Anemia, unspecified: Secondary | ICD-10-CM | POA: Insufficient documentation

## 2022-01-09 DIAGNOSIS — E876 Hypokalemia: Secondary | ICD-10-CM | POA: Diagnosis not present

## 2022-01-09 DIAGNOSIS — Z79899 Other long term (current) drug therapy: Secondary | ICD-10-CM | POA: Insufficient documentation

## 2022-01-09 LAB — COMPREHENSIVE METABOLIC PANEL
ALT: 16 U/L (ref 0–44)
AST: 18 U/L (ref 15–41)
Albumin: 4.3 g/dL (ref 3.5–5.0)
Alkaline Phosphatase: 57 U/L (ref 38–126)
Anion gap: 4 — ABNORMAL LOW (ref 5–15)
BUN: 12 mg/dL (ref 6–20)
CO2: 17 mmol/L — ABNORMAL LOW (ref 22–32)
Calcium: 9 mg/dL (ref 8.9–10.3)
Chloride: 118 mmol/L — ABNORMAL HIGH (ref 98–111)
Creatinine, Ser: 0.9 mg/dL (ref 0.44–1.00)
GFR, Estimated: >60 mL/min (ref >60)
Glucose, Bld: 98 mg/dL (ref 70–99)
Potassium: 2.9 mmol/L — ABNORMAL LOW (ref 3.5–5.1)
Sodium: 139 mmol/L (ref 135–145)
Total Bilirubin: 0.7 mg/dL (ref 0.3–1.2)
Total Protein: 7.6 g/dL (ref 6.5–8.1)

## 2022-01-09 LAB — CBC
HCT: 28.3 % — ABNORMAL LOW (ref 36.0–46.0)
Hemoglobin: 8.1 g/dL — ABNORMAL LOW (ref 12.0–15.0)
MCH: 20 pg — ABNORMAL LOW (ref 26.0–34.0)
MCHC: 28.6 g/dL — ABNORMAL LOW (ref 30.0–36.0)
MCV: 69.9 fL — ABNORMAL LOW (ref 80.0–100.0)
Platelets: 196 10*3/uL (ref 150–400)
RBC: 4.05 MIL/uL (ref 3.87–5.11)
RDW: 18.9 % — ABNORMAL HIGH (ref 11.5–15.5)
WBC: 3.9 10*3/uL — ABNORMAL LOW (ref 4.0–10.5)
nRBC: 0 % (ref 0.0–0.2)

## 2022-01-09 LAB — I-STAT BETA HCG BLOOD, ED (MC, WL, AP ONLY): I-stat hCG, quantitative: <5 m[IU]/mL (ref <5)

## 2022-01-09 LAB — PREPARE RBC (CROSSMATCH)

## 2022-01-09 MED ORDER — POTASSIUM CHLORIDE CRYS ER 20 MEQ PO TBCR
40.0000 meq | EXTENDED_RELEASE_TABLET | Freq: Once | ORAL | Status: AC
  Administered 2022-01-10: 40 meq via ORAL
  Filled 2022-01-09: qty 2

## 2022-01-09 MED ORDER — SODIUM CHLORIDE 0.9 % IV SOLN
10.0000 mL/h | Freq: Once | INTRAVENOUS | Status: AC
  Administered 2022-01-10: 10 mL/h via INTRAVENOUS

## 2022-01-09 MED ORDER — POTASSIUM CHLORIDE 10 MEQ/100ML IV SOLN
10.0000 meq | INTRAVENOUS | Status: AC
  Administered 2022-01-10 (×2): 10 meq via INTRAVENOUS
  Filled 2022-01-09 (×2): qty 100

## 2022-01-09 NOTE — ED Provider Notes (Signed)
[facility]-EMERGENCY DEPT Provider Note   CSN: [id number] Arrival date & time: 01/09/22  1641     History {Add pertinent medical, surgical, social history, OB history to HPI:1} Chief Complaint  Patient presents with   Abnormal Lab    Phyllis House is a 46 y.o. female.  Patient was referred to the emergency department by her urologist at [facility].  Patient has been having persistent hematuria for which she is cared for at [facility].  Patient reports that 2 months ago her hemoglobin dropped and she required blood transfusion.  She was called tonight after having outpatient labs and told to come to the ER because her hemoglobin had dropped again.  She reports that she has had persistent heavy bleeding except for the last couple of days.  She is experiencing dizziness with standing, shortness of breath with walking short distances, heart palpitations with exertion.       Home Medications Prior to Admission medications   Medication Sig Start Date End Date Taking? Authorizing Provider  levonorgestrel (MIRENA) 20 MCG/24HR IUD 1 each by Intrauterine route once. Implanted November 2019   Yes [provider]  Norethin Ace-Eth Estrad-FE (LOESTRIN 24 FE PO) Take 1 tablet by mouth daily.   Yes [provider]  potassium citrate (UROCIT-K) 10 MEQ (1080 MG) SR tablet Take 10 mEq by mouth 2 (two) times daily with a meal.   Yes [provider]  ferrous sulfate 325 (65 FE) MG tablet Take 1 tablet (325 mg total) by mouth daily. Patient not taking: Reported on 01/09/2022 11/01/21   Long, Arlyss Repress, MD  HYDROcodone-acetaminophen (NORCO/VICODIN) 5-325 MG tablet Take 1 tablet by mouth every 6 (six) hours as needed. Patient not taking: Reported on 01/09/2022 07/12/21   [name], MD  ondansetron (ZOFRAN-ODT) 4 MG disintegrating tablet Take 1 tablet (4 mg total) by mouth every 8 (eight) hours as needed for nausea or vomiting. Patient not taking: Reported on  01/09/2022 11/01/21   Long, Arlyss Repress, MD  oxyCODONE-acetaminophen (PERCOCET/ROXICET) 5-325 MG tablet Take 1 tablet by mouth every 6 (six) hours as needed for severe pain. Patient not taking: Reported on 01/09/2022 11/01/21   Long, Arlyss Repress, MD  potassium chloride SA (KLOR-CON M) 20 MEQ tablet Take 2 tablets (40 mEq total) by mouth daily for 5 days. 11/01/21 11/06/21  Long, Arlyss Repress, MD  senna-docusate (SENOKOT-S) 8.6-50 MG tablet Take 1 tablet by mouth daily. Patient not taking: Reported on 01/09/2022 11/01/21   Long, Arlyss Repress, MD      Allergies    Adhesive [tape]    Review of Systems   Review of Systems  Physical Exam Updated Vital Signs BP 132/89   Pulse 88   Temp 98.1 F (36.7 C) (Oral)   Resp 18   Ht 5\' 2"  (1.575 m)   Wt 81.6 kg   SpO2 100%   BMI 32.92 kg/m  Physical Exam Vitals and nursing note reviewed.  Constitutional:      General: She is not in acute distress.    Appearance: She is well-developed.  HENT:     Head: Normocephalic and atraumatic.     Mouth/Throat:     Mouth: Mucous membranes are moist.  Eyes:     General: Vision grossly intact. Gaze aligned appropriately.     Extraocular Movements: Extraocular movements intact.     Conjunctiva/sclera: Conjunctivae normal.  Cardiovascular:     Rate and Rhythm: Normal rate and regular rhythm.     Pulses: Normal pulses.  Heart sounds: Normal heart sounds, S1 normal and S2 normal. No murmur heard.    No friction rub. No gallop.  Pulmonary:     Effort: Pulmonary effort is normal. No respiratory distress.     Breath sounds: Normal breath sounds.  Abdominal:     General: Bowel sounds are normal.     Palpations: Abdomen is soft.     Tenderness: There is no abdominal tenderness. There is no guarding or rebound.     Hernia: No hernia is present.  Musculoskeletal:        General: No swelling.     Cervical back: Full passive range of motion without pain, normal range of motion and neck supple. No spinous process tenderness or  muscular tenderness. Normal range of motion.     Right lower leg: No edema.     Left lower leg: No edema.  Skin:    General: Skin is warm and dry.     Capillary Refill: Capillary refill takes less than 2 seconds.     Findings: No ecchymosis, erythema, rash or wound.  Neurological:     General: No focal deficit present.     Mental Status: She is alert and oriented to person, place, and time.     GCS: GCS eye subscore is 4. GCS verbal subscore is 5. GCS motor subscore is 6.     Cranial Nerves: Cranial nerves 2-12 are intact.     Sensory: Sensation is intact.     Motor: Motor function is intact.     Coordination: Coordination is intact.  Psychiatric:        Attention and Perception: Attention normal.        Mood and Affect: Mood normal.        Speech: Speech normal.        Behavior: Behavior normal.     ED Results / Procedures / Treatments   Labs (all labs ordered are listed, but only abnormal results are displayed) Labs Reviewed  COMPREHENSIVE METABOLIC PANEL - Abnormal; Notable for the following components:      Result Value   Potassium 2.9 (*)    Chloride 118 (*)    CO2 17 (*)    Anion gap 4 (*)    All other components within normal limits  CBC - Abnormal; Notable for the following components:   WBC 3.9 (*)    Hemoglobin 8.1 (*)    HCT 28.3 (*)    MCV 69.9 (*)    MCH 20.0 (*)    MCHC 28.6 (*)    RDW 18.9 (*)    All other components within normal limits  MAGNESIUM  I-STAT BETA HCG BLOOD, ED ([facility], [facility], [facility] ONLY)  POC OCCULT BLOOD, ED  TYPE AND SCREEN  PREPARE RBC (CROSSMATCH)    EKG None  Radiology No results found.  Procedures Procedures  {Document cardiac monitor, telemetry assessment procedure when appropriate:1}  Medications Ordered in ED Medications  potassium chloride 10 mEq in 100 mL IVPB (has no administration in time range)  potassium chloride SA (KLOR-CON M) CR tablet 40 mEq (has no administration in time range)  0.9 %  sodium chloride infusion  (has no administration in time range)    ED Course/ Medical Decision Making/ A&P                           Medical Decision Making Amount and/or Complexity of Data Reviewed Labs: ordered.  Risk Prescription drug management.   ***  {  Document critical care time when appropriate:1} {Document review of labs and clinical decision tools ie heart score, Chads2Vasc2 etc:1}  {Document your independent review of radiology images, and any outside records:1} {Document your discussion with family members, caretakers, and with consultants:1} {Document social determinants of health affecting pt's care:1} {Document your decision making why or why not admission, treatments were needed:1} Final Clinical Impression(s) / ED Diagnoses Final diagnoses:  None    Rx / DC Orders ED Discharge Orders     None

## 2022-01-09 NOTE — ED Provider Triage Note (Signed)
Emergency Medicine Provider Triage Evaluation Note  Phyllis House , a 46 y.o. female  was evaluated in triage.  Pt complains of anemia.  Hemoglobin was 7.3 with her urologist at [facility].  She is required blood transfusions in the past and they requested her go to emergency department to get 1.  Hematuria is the known cause of her anemia  Does report passing a kidney stone a few days ago which stopped her hematuria but she is very fatigued, short of breath, intermittently lightheaded and feeling weak. Review of Systems  Positive: Fatigue, shortness of breath, intermittent lightheadedness and weakness Negative: Melena or hematochezia  Physical Exam  BP 117/73 (BP Location: Right Arm)   Pulse 88   Temp 98.3 F (36.8 C) (Oral)   Resp 18   Ht 5\' 2"  (1.575 m)   Wt 81.6 kg   SpO2 100%   BMI 32.92 kg/m  Gen:   Awake, no distress   Resp:  Normal effort  MSK:   Moves extremities without difficulty  Other:  Well-appearing on physical exam  Medical Decision Making  Medically screening exam initiated at 5:32 PM.  Appropriate orders placed.  Phyllis House was informed that the remainder of the evaluation will be completed by another provider, this initial triage assessment does not replace that evaluation, and the importance of remaining in the ED until their evaluation is complete.     Phyllis Harris, PA-C 01/09/22 1739

## 2022-01-09 NOTE — ED Triage Notes (Signed)
Patient states she was instructed by her urologist to come to the ED due to  a Hgb-7.5. Patient states her bleeding is stemming from her kidneys/kidney stones. Patient states she is scheduled for a ureteroscopy on 01/18/22.

## 2022-01-10 MED ORDER — POTASSIUM CHLORIDE CRYS ER 20 MEQ PO TBCR
40.0000 meq | EXTENDED_RELEASE_TABLET | Freq: Once | ORAL | Status: AC
  Administered 2022-01-10: 40 meq via ORAL
  Filled 2022-01-10: qty 2

## 2022-01-11 LAB — TYPE AND SCREEN
ABO/RH(D): O POS
Antibody Screen: NEGATIVE
Unit division: 0

## 2022-01-11 LAB — BPAM RBC
Blood Product Expiration Date: 202310152359
ISSUE DATE / TIME: 202309120110
Unit Type and Rh: 5100

## 2022-06-23 ENCOUNTER — Ambulatory Visit
Admission: RE | Admit: 2022-06-23 | Discharge: 2022-06-23 | Disposition: A | Discharge status: Home / Self Care | Payer: BC Managed Care – PPO | Source: Ambulatory Visit | Attending: Obstetrics & Gynecology | Admitting: Obstetrics & Gynecology

## 2022-06-23 ENCOUNTER — Other Ambulatory Visit: Payer: Self-pay | Admitting: Obstetrics & Gynecology

## 2022-06-23 DIAGNOSIS — N6489 Other specified disorders of breast: Secondary | ICD-10-CM

## 2022-10-14 ENCOUNTER — Emergency Department (HOSPITAL_COMMUNITY)
Admission: EM | Admit: 2022-10-14 | Discharge: 2022-10-14 | Disposition: A | Discharge status: Home / Self Care | Payer: BC Managed Care – PPO | Attending: Emergency Medicine | Admitting: Emergency Medicine

## 2022-10-14 ENCOUNTER — Other Ambulatory Visit: Payer: Self-pay

## 2022-10-14 ENCOUNTER — Emergency Department (HOSPITAL_COMMUNITY): Payer: BC Managed Care – PPO

## 2022-10-14 DIAGNOSIS — N200 Calculus of kidney: Secondary | ICD-10-CM

## 2022-10-14 DIAGNOSIS — E876 Hypokalemia: Secondary | ICD-10-CM | POA: Insufficient documentation

## 2022-10-14 DIAGNOSIS — N132 Hydronephrosis with renal and ureteral calculous obstruction: Secondary | ICD-10-CM | POA: Diagnosis not present

## 2022-10-14 DIAGNOSIS — R1031 Right lower quadrant pain: Secondary | ICD-10-CM | POA: Diagnosis present

## 2022-10-14 LAB — URINALYSIS, ROUTINE W REFLEX MICROSCOPIC
Bilirubin Urine: NEGATIVE
Glucose, UA: NEGATIVE mg/dL
Ketones, ur: NEGATIVE mg/dL
Nitrite: NEGATIVE
Protein, ur: NEGATIVE mg/dL
Specific Gravity, Urine: 1.012 (ref 1.005–1.030)
pH: 7 (ref 5.0–8.0)

## 2022-10-14 LAB — COMPREHENSIVE METABOLIC PANEL
ALT: 12 U/L (ref 0–44)
AST: 14 U/L — ABNORMAL LOW (ref 15–41)
Albumin: 3.4 g/dL — ABNORMAL LOW (ref 3.5–5.0)
Alkaline Phosphatase: 50 U/L (ref 38–126)
Anion gap: 4 — ABNORMAL LOW (ref 5–15)
BUN: 11 mg/dL (ref 6–20)
CO2: 16 mmol/L — ABNORMAL LOW (ref 22–32)
Calcium: 8.1 mg/dL — ABNORMAL LOW (ref 8.9–10.3)
Chloride: 115 mmol/L — ABNORMAL HIGH (ref 98–111)
Creatinine, Ser: 0.97 mg/dL (ref 0.44–1.00)
GFR, Estimated: >60 mL/min (ref >60)
Glucose, Bld: 92 mg/dL (ref 70–99)
Potassium: 3.4 mmol/L — ABNORMAL LOW (ref 3.5–5.1)
Sodium: 135 mmol/L (ref 135–145)
Total Bilirubin: 0.5 mg/dL (ref 0.3–1.2)
Total Protein: 6.4 g/dL — ABNORMAL LOW (ref 6.5–8.1)

## 2022-10-14 LAB — CBC WITH DIFFERENTIAL/PLATELET
Abs Immature Granulocytes: 0.02 10*3/uL (ref 0.00–0.07)
Basophils Absolute: 0.1 10*3/uL (ref 0.0–0.1)
Basophils Relative: 2 %
Eosinophils Absolute: 0 10*3/uL (ref 0.0–0.5)
Eosinophils Relative: 1 %
HCT: 32.8 % — ABNORMAL LOW (ref 36.0–46.0)
Hemoglobin: 9.6 g/dL — ABNORMAL LOW (ref 12.0–15.0)
Immature Granulocytes: 0 %
Lymphocytes Relative: 21 %
Lymphs Abs: 1 10*3/uL (ref 0.7–4.0)
MCH: 20.3 pg — ABNORMAL LOW (ref 26.0–34.0)
MCHC: 29.3 g/dL — ABNORMAL LOW (ref 30.0–36.0)
MCV: 69.5 fL — ABNORMAL LOW (ref 80.0–100.0)
Monocytes Absolute: 0.4 10*3/uL (ref 0.1–1.0)
Monocytes Relative: 9 %
Neutro Abs: 3.1 10*3/uL (ref 1.7–7.7)
Neutrophils Relative %: 67 %
Platelets: 175 10*3/uL (ref 150–400)
RBC: 4.72 MIL/uL (ref 3.87–5.11)
RDW: 18.6 % — ABNORMAL HIGH (ref 11.5–15.5)
WBC: 4.6 10*3/uL (ref 4.0–10.5)
nRBC: 0 % (ref 0.0–0.2)

## 2022-10-14 LAB — LIPASE, BLOOD: Lipase: 37 U/L (ref 11–51)

## 2022-10-14 MED ORDER — ONDANSETRON HCL 4 MG/2ML IJ SOLN
4.0000 mg | Freq: Once | INTRAMUSCULAR | Status: AC
  Administered 2022-10-14: 4 mg via INTRAVENOUS
  Filled 2022-10-14: qty 2

## 2022-10-14 MED ORDER — OXYCODONE-ACETAMINOPHEN 5-325 MG PO TABS: 1.0000 | ORAL_TABLET | Freq: Four times a day (QID) | ORAL | 0 refills | Status: DC | PRN

## 2022-10-14 MED ORDER — ONDANSETRON HCL 4 MG PO TABS: 4.0000 mg | ORAL_TABLET | Freq: Four times a day (QID) | ORAL | 0 refills | Status: DC

## 2022-10-14 MED ORDER — MORPHINE SULFATE (PF) 4 MG/ML IV SOLN
4.0000 mg | Freq: Once | INTRAVENOUS | Status: AC
  Administered 2022-10-14: 4 mg via INTRAVENOUS
  Filled 2022-10-14: qty 1

## 2022-10-14 MED ORDER — HYDROMORPHONE HCL 1 MG/ML IJ SOLN
0.5000 mg | Freq: Once | INTRAMUSCULAR | Status: AC
  Administered 2022-10-14: 0.5 mg via INTRAVENOUS
  Filled 2022-10-14: qty 1

## 2022-10-14 MED ORDER — KETOROLAC TROMETHAMINE 15 MG/ML IJ SOLN
15.0000 mg | Freq: Once | INTRAMUSCULAR | Status: AC
  Administered 2022-10-14: 15 mg via INTRAVENOUS
  Filled 2022-10-14: qty 1

## 2022-10-14 NOTE — ED Notes (Signed)
Pt educated on Discharge paperwork and verbalizes understanding, pt denies any needs at this time and questions, pt s/o at bedside and will be taking her home, IV removed, pt a/o x4 at time of discharge , pt ambulatory with no assistance , RR even and unlabored

## 2022-10-14 NOTE — ED Provider Notes (Signed)
[place] [facility] AT [facility] Provider Note   CSN: [id number] Arrival date & time: 10/14/22  1454     History {Add pertinent medical, surgical, social history, OB history to HPI:1} Chief Complaint  Patient presents with   Flank Pain    Phyllis House is a 47 y.o. female with past medical history significant for renal tubular acidosis, anxiety, frequent kidney stones who presents with concern for flank pain in the right lower pelvis since noon today.  Patient reports this feels similar to kidney stones.  Patient reports that the degree of the pain was severe enough that she cannot make it to her normal doctor at [facility].  At time of my evaluation she rates pain 7/10.   Flank Pain       Home Medications Prior to Admission medications   Medication Sig Start Date End Date Taking? Authorizing Provider  ondansetron (ZOFRAN) 4 MG tablet Take 1 tablet (4 mg total) by mouth every 6 (six) hours. 10/14/22  Yes Cristen Murcia H, PA-C  oxyCODONE-acetaminophen (PERCOCET/ROXICET) 5-325 MG tablet Take 1 tablet by mouth every 6 (six) hours as needed for severe pain. 10/14/22  Yes Jaquae Rieves H, PA-C  ferrous sulfate 325 (65 FE) MG tablet Take 1 tablet (325 mg total) by mouth daily. Patient not taking: Reported on 01/09/2022 11/01/21   Long, Arlyss Repress, MD  HYDROcodone-acetaminophen (NORCO/VICODIN) 5-325 MG tablet Take 1 tablet by mouth every 6 (six) hours as needed. Patient not taking: Reported on 01/09/2022 07/12/21   [name], MD  levonorgestrel (MIRENA) 20 MCG/24HR IUD 1 each by Intrauterine route once. Implanted November 2019    [provider]  Norethin Ace-Eth Estrad-FE (LOESTRIN 24 FE PO) Take 1 tablet by mouth daily.    [provider]  ondansetron (ZOFRAN-ODT) 4 MG disintegrating tablet Take 1 tablet (4 mg total) by mouth every 8 (eight) hours as needed for nausea or vomiting. Patient not taking: Reported on 01/09/2022 11/01/21    Long, Arlyss Repress, MD  potassium chloride SA (KLOR-CON M) 20 MEQ tablet Take 2 tablets (40 mEq total) by mouth daily for 5 days. 11/01/21 11/06/21  Long, Arlyss Repress, MD  potassium citrate (UROCIT-K) 10 MEQ (1080 MG) SR tablet Take 10 mEq by mouth 2 (two) times daily with a meal.    [provider]  senna-docusate (SENOKOT-S) 8.6-50 MG tablet Take 1 tablet by mouth daily. Patient not taking: Reported on 01/09/2022 11/01/21   Long, Arlyss Repress, MD      Allergies    Adhesive [tape]    Review of Systems   Review of Systems  Genitourinary:  Positive for flank pain.  All other systems reviewed and are negative.   Physical Exam Updated Vital Signs BP 126/69   Pulse (!) 58   Temp 97.8 F (36.6 C) (Oral)   Resp 18   Ht 5\' 2"  (1.575 m)   Wt 81 kg   SpO2 100%   BMI 32.66 kg/m  Physical Exam Vitals and nursing note reviewed.  Constitutional:      General: She is not in acute distress.    Appearance: Normal appearance.  HENT:     Head: Normocephalic and atraumatic.  Eyes:     General:        Right eye: No discharge.        Left eye: No discharge.  Cardiovascular:     Rate and Rhythm: Normal rate and regular rhythm.     Heart sounds: No murmur heard.  No friction rub. No gallop.  Pulmonary:     Effort: Pulmonary effort is normal.     Breath sounds: Normal breath sounds.  Abdominal:     General: Bowel sounds are normal.     Palpations: Abdomen is soft.     Comments: Focally tender in the right lower quadrant towards right flank, no CVA tenderness bilaterally  Skin:    General: Skin is warm and dry.     Capillary Refill: Capillary refill takes less than 2 seconds.  Neurological:     Mental Status: She is alert and oriented to person, place, and time.  Psychiatric:        Mood and Affect: Mood normal.        Behavior: Behavior normal.     ED Results / Procedures / Treatments   Labs (all labs ordered are listed, but only abnormal results are displayed) Labs Reviewed  CBC  WITH DIFFERENTIAL/PLATELET - Abnormal; Notable for the following components:      Result Value   Hemoglobin 9.6 (*)    HCT 32.8 (*)    MCV 69.5 (*)    MCH 20.3 (*)    MCHC 29.3 (*)    RDW 18.6 (*)    All other components within normal limits  COMPREHENSIVE METABOLIC PANEL - Abnormal; Notable for the following components:   Potassium 3.4 (*)    Chloride 115 (*)    CO2 16 (*)    Calcium 8.1 (*)    Total Protein 6.4 (*)    Albumin 3.4 (*)    AST 14 (*)    Anion gap 4 (*)    All other components within normal limits  URINALYSIS, ROUTINE W REFLEX MICROSCOPIC - Abnormal; Notable for the following components:   APPearance HAZY (*)    Hgb urine dipstick MODERATE (*)    Leukocytes,Ua SMALL (*)    Bacteria, UA RARE (*)    All other components within normal limits  LIPASE, BLOOD    EKG None  Radiology CT Renal Stone Study  Result Date: 10/14/2022 CLINICAL DATA:  Left-sided flank pain, initial encounter EXAM: CT ABDOMEN AND PELVIS WITHOUT CONTRAST TECHNIQUE: Multidetector CT imaging of the abdomen and pelvis was performed following the standard protocol without IV contrast. RADIATION DOSE REDUCTION: This exam was performed according to the departmental dose-optimization program which includes automated exposure control, adjustment of the mA and/or kV according to patient size and/or use of iterative reconstruction technique. COMPARISON:  06/03/2021 FINDINGS: Lower chest: No acute abnormality. Hepatobiliary: No focal liver abnormality is seen. No gallstones, gallbladder wall thickening, or biliary dilatation. Pancreas: Unremarkable. No pancreatic ductal dilatation or surrounding inflammatory changes. Spleen: Normal in size without focal abnormality. Adrenals/Urinary Tract: Adrenal glands are within normal limits. Kidneys again demonstrate multiple renal calculi. The largest of these on the left measures 8 mm in the lower pole. The largest of these on the right measures 6 mm in the lower pole.  Fullness of the renal pelvis and right ureter is seen. This extends inferiorly to the distal right ureter just above the ureterovesical junction. Multiple ureteral stones are identified in the distal right ureter causing the obstructive change. The bladder is decompressed. Stomach/Bowel: No obstructive or inflammatory changes of colon are noted. The appendix is within normal limits. Small bowel and stomach are unremarkable. Vascular/Lymphatic: No significant vascular findings are present. No enlarged abdominal or pelvic lymph nodes. Reproductive: Uterus and bilateral adnexa are unremarkable. IUD is noted in place. Other: No abdominal wall hernia or abnormality.  No abdominopelvic ascites. Musculoskeletal: No acute or significant osseous findings. IMPRESSION: Considerable right hydronephrosis and hydroureter secondary to multiple distal right ureteral stones. The largest of these appears to measure 7 mm. Multiple bilateral renal calculi as described above. No left-sided hydronephrosis is seen. Electronically Signed   By: Alcide Clever M.D.   On: 10/14/2022 19:14    Procedures Procedures  {Document cardiac monitor, telemetry assessment procedure when appropriate:1}  Medications Ordered in ED Medications  morphine (PF) 4 MG/ML injection 4 mg (4 mg Intravenous Given 10/14/22 1644)  ketorolac (TORADOL) 15 MG/ML injection 15 mg (15 mg Intravenous Given 10/14/22 1644)  ondansetron (ZOFRAN) injection 4 mg (4 mg Intravenous Given 10/14/22 1644)  HYDROmorphone (DILAUDID) injection 0.5 mg (0.5 mg Intravenous Given 10/14/22 1956)    ED Course/ Medical Decision Making/ A&P   {   Click here for ABCD2, HEART and other calculatorsREFRESH Note before signing :1}                          Medical Decision Making Risk Prescription drug management.   This patient is a 47 y.o. female  who presents to the ED for concern of ***.   Differential diagnoses prior to evaluation: The emergent differential diagnosis includes,  but is not limited to,  *** . This is not an exhaustive differential.   Past Medical History / Co-morbidities / Social History: ***  Additional history: Chart reviewed. Pertinent results include: ***  Physical Exam: Physical exam performed. The pertinent findings include: ***  Lab Tests/Imaging studies: I personally interpreted labs/imaging and the pertinent results include:  ***. ***I agree with the radiologist interpretation.  Cardiac monitoring: EKG obtained and interpreted by my attending physician which shows: ***   Medications: I ordered medication including ***.  I have reviewed the patients home medicines and have made adjustments as needed.   Disposition: After consideration of the diagnostic results and the patients response to treatment, I feel that *** .   ***emergency department workup does not suggest an emergent condition requiring admission or immediate intervention beyond what has been performed at this time. The plan is: ***. The patient is safe for discharge and has been instructed to return immediately for worsening symptoms, change in symptoms or any other concerns.  Final Clinical Impression(s) / ED Diagnoses Final diagnoses:  Multiple kidney stones  Hydronephrosis with urinary obstruction due to renal calculus    Rx / DC Orders ED Discharge Orders          Ordered    oxyCODONE-acetaminophen (PERCOCET/ROXICET) 5-325 MG tablet  Every 6 hours PRN        10/14/22 1942    ondansetron (ZOFRAN) 4 MG tablet  Every 6 hours        10/14/22 1942

## 2022-10-14 NOTE — Discharge Instructions (Signed)
You were seen in the emergency department and found to have a kidney stone.  Please take ibuprofen as needed for pain.  We are sending you home with multiple medications to assist with passing the stone and for residual pain/nausea:  -Flomax-this is a medication to help pass the stone, it allows urine to exit the body more freely.  Please take this once daily with a meal.  -Percocet-this is a narcotic/controlled substance medication that has potential addicting qualities.  We recommend that you take 1-2 tablets every 6 hours as needed for severe pain.  Do not drive or operate heavy machinery when taking this medicine as it can be sedating. Do not drink alcohol or take other sedating medications when taking this medicine for safety reasons.  Keep this out of reach of small children.  Please be aware this medicine has Tylenol in it (325 mg/tab) do not exceed the maximum dose of Tylenol in a day per over the counter recommendations should you decide to supplement with Tylenol over the counter.   -Zofran-this is an antinausea medication, you may take this every 8 hours as needed for nausea and vomiting, please allow the tablet to dissolve underneath of your tongue.   We have prescribed you new medication(s) today. Discuss the medications prescribed today with your pharmacist as they can have adverse effects and interactions with your other medicines including over the counter and prescribed medications. Seek medical evaluation if you start to experience new or abnormal symptoms after taking one of these medicines, seek care immediately if you start to experience difficulty breathing, feeling of your throat closing, facial swelling, or rash as these could be indications of a more serious allergic reaction  Please follow-up with the urology group provided in your discharge instructions within 3 to 5 days.  Return to the ER for new or worsening symptoms including but not limited to worsening pain not controlled  by these medicines, inability to keep fluids down, fever, or any other concerns that you may have.   

## 2022-10-14 NOTE — ED Triage Notes (Signed)
Pt c/o pain in LL pelvic area since noon today- pt has extensive hx kidney stones and feels like this is one.  [id number]

## 2022-10-14 NOTE — ED Notes (Signed)
Urine culture sent to main lab with urine specimen.  

## 2022-10-15 ENCOUNTER — Other Ambulatory Visit: Payer: Self-pay

## 2022-10-15 ENCOUNTER — Encounter (HOSPITAL_COMMUNITY): Payer: Self-pay | Admitting: Internal Medicine

## 2022-10-15 ENCOUNTER — Observation Stay (HOSPITAL_COMMUNITY)
Admission: EM | Admit: 2022-10-15 | Discharge: 2022-10-17 | Disposition: A | Discharge status: Home / Self Care | Payer: BC Managed Care – PPO | Attending: Internal Medicine | Admitting: Internal Medicine

## 2022-10-15 DIAGNOSIS — E669 Obesity, unspecified: Secondary | ICD-10-CM | POA: Diagnosis present

## 2022-10-15 DIAGNOSIS — N132 Hydronephrosis with renal and ureteral calculous obstruction: Principal | ICD-10-CM | POA: Insufficient documentation

## 2022-10-15 DIAGNOSIS — F419 Anxiety disorder, unspecified: Secondary | ICD-10-CM | POA: Diagnosis present

## 2022-10-15 DIAGNOSIS — N23 Unspecified renal colic: Secondary | ICD-10-CM | POA: Insufficient documentation

## 2022-10-15 DIAGNOSIS — R1031 Right lower quadrant pain: Secondary | ICD-10-CM | POA: Diagnosis present

## 2022-10-15 DIAGNOSIS — E876 Hypokalemia: Secondary | ICD-10-CM | POA: Diagnosis not present

## 2022-10-15 DIAGNOSIS — R52 Pain, unspecified: Secondary | ICD-10-CM

## 2022-10-15 DIAGNOSIS — N1330 Unspecified hydronephrosis: Secondary | ICD-10-CM | POA: Diagnosis present

## 2022-10-15 DIAGNOSIS — N2589 Other disorders resulting from impaired renal tubular function: Secondary | ICD-10-CM | POA: Diagnosis present

## 2022-10-15 LAB — URINALYSIS, ROUTINE W REFLEX MICROSCOPIC
Bilirubin Urine: NEGATIVE
Glucose, UA: NEGATIVE mg/dL
Hgb urine dipstick: NEGATIVE
Ketones, ur: NEGATIVE mg/dL
Nitrite: NEGATIVE
Protein, ur: NEGATIVE mg/dL
Specific Gravity, Urine: 1.013 (ref 1.005–1.030)
pH: 6 (ref 5.0–8.0)

## 2022-10-15 LAB — CBC
HCT: 31.8 % — ABNORMAL LOW (ref 36.0–46.0)
Hemoglobin: 9.4 g/dL — ABNORMAL LOW (ref 12.0–15.0)
MCH: 20.6 pg — ABNORMAL LOW (ref 26.0–34.0)
MCHC: 29.6 g/dL — ABNORMAL LOW (ref 30.0–36.0)
MCV: 69.7 fL — ABNORMAL LOW (ref 80.0–100.0)
Platelets: 159 10*3/uL (ref 150–400)
RBC: 4.56 MIL/uL (ref 3.87–5.11)
RDW: 18.7 % — ABNORMAL HIGH (ref 11.5–15.5)
WBC: 6.7 10*3/uL (ref 4.0–10.5)
nRBC: 0 % (ref 0.0–0.2)

## 2022-10-15 LAB — BASIC METABOLIC PANEL
Anion gap: 6 (ref 5–15)
BUN: 12 mg/dL (ref 6–20)
CO2: 15 mmol/L — ABNORMAL LOW (ref 22–32)
Calcium: 8.2 mg/dL — ABNORMAL LOW (ref 8.9–10.3)
Chloride: 113 mmol/L — ABNORMAL HIGH (ref 98–111)
Creatinine, Ser: 1.01 mg/dL — ABNORMAL HIGH (ref 0.44–1.00)
GFR, Estimated: >60 mL/min (ref >60)
Glucose, Bld: 131 mg/dL — ABNORMAL HIGH (ref 70–99)
Potassium: 3.2 mmol/L — ABNORMAL LOW (ref 3.5–5.1)
Sodium: 134 mmol/L — ABNORMAL LOW (ref 135–145)

## 2022-10-15 LAB — I-STAT BETA HCG BLOOD, ED (MC, WL, AP ONLY): I-stat hCG, quantitative: <5 m[IU]/mL (ref <5)

## 2022-10-15 MED ORDER — HEPARIN SODIUM (PORCINE) 5000 UNIT/ML IJ SOLN: 5000.0000 [IU] | Freq: Three times a day (TID) | INTRAMUSCULAR | Status: DC

## 2022-10-15 MED ORDER — OXYCODONE HCL 5 MG PO TABS: 2.5000 mg | ORAL_TABLET | ORAL | Status: DC | PRN

## 2022-10-15 MED ORDER — ONDANSETRON HCL 4 MG/2ML IJ SOLN
4.0000 mg | Freq: Once | INTRAMUSCULAR | Status: AC
  Administered 2022-10-15: 4 mg via INTRAVENOUS
  Filled 2022-10-15: qty 2

## 2022-10-15 MED ORDER — ACETAMINOPHEN 500 MG PO TABS: 1000.0000 mg | ORAL_TABLET | Freq: Three times a day (TID) | ORAL | Status: DC | PRN

## 2022-10-15 MED ORDER — POLYETHYLENE GLYCOL 3350 17 G PO PACK: 17.0000 g | PACK | Freq: Every day | ORAL | Status: DC | PRN

## 2022-10-15 MED ORDER — TAMSULOSIN HCL 0.4 MG PO CAPS
0.4000 mg | ORAL_CAPSULE | Freq: Every day | ORAL | Status: DC
  Administered 2022-10-16 – 2022-10-17 (×2): 0.4 mg via ORAL
  Filled 2022-10-15 (×2): qty 1

## 2022-10-15 MED ORDER — KETOROLAC TROMETHAMINE 15 MG/ML IJ SOLN
15.0000 mg | Freq: Once | INTRAMUSCULAR | Status: AC
  Administered 2022-10-15: 15 mg via INTRAVENOUS
  Filled 2022-10-15: qty 1

## 2022-10-15 MED ORDER — SODIUM CHLORIDE 0.9% FLUSH
3.0000 mL | Freq: Two times a day (BID) | INTRAVENOUS | Status: DC
  Administered 2022-10-15 – 2022-10-17 (×4): 3 mL via INTRAVENOUS

## 2022-10-15 MED ORDER — OXYCODONE-ACETAMINOPHEN 5-325 MG PO TABS
1.0000 | ORAL_TABLET | ORAL | Status: DC | PRN
  Administered 2022-10-15 – 2022-10-16 (×4): 1 via ORAL
  Filled 2022-10-15 (×4): qty 1

## 2022-10-15 MED ORDER — SODIUM CHLORIDE 0.9 % IV BOLUS
1000.0000 mL | Freq: Once | INTRAVENOUS | Status: AC
  Administered 2022-10-15: 1000 mL via INTRAVENOUS

## 2022-10-15 MED ORDER — ONDANSETRON HCL 4 MG/2ML IJ SOLN
4.0000 mg | Freq: Three times a day (TID) | INTRAMUSCULAR | Status: DC | PRN
  Administered 2022-10-16 (×3): 4 mg via INTRAVENOUS
  Filled 2022-10-15 (×3): qty 2

## 2022-10-15 MED ORDER — LACTATED RINGERS IV SOLN: INTRAVENOUS | Status: AC

## 2022-10-15 MED ORDER — KETOROLAC TROMETHAMINE 15 MG/ML IJ SOLN
15.0000 mg | Freq: Four times a day (QID) | INTRAMUSCULAR | Status: DC | PRN
  Administered 2022-10-16 – 2022-10-17 (×3): 15 mg via INTRAVENOUS
  Filled 2022-10-15 (×3): qty 1

## 2022-10-15 MED ORDER — ZONISAMIDE 100 MG PO CAPS
100.0000 mg | ORAL_CAPSULE | Freq: Every day | ORAL | Status: DC
  Administered 2022-10-16: 100 mg via ORAL
  Filled 2022-10-15 (×3): qty 1

## 2022-10-15 MED ORDER — POTASSIUM CHLORIDE CRYS ER 20 MEQ PO TBCR
30.0000 meq | EXTENDED_RELEASE_TABLET | ORAL | Status: AC
  Administered 2022-10-15: 30 meq via ORAL
  Filled 2022-10-15: qty 1

## 2022-10-15 MED ORDER — SODIUM CHLORIDE 0.9% IV SOLUTION: Freq: Once | INTRAVENOUS | Status: DC

## 2022-10-15 NOTE — Consult Note (Signed)
Urology Consult Note   Requesting Attending Physician:  Charlane Ferretti, DO Service Providing Consult: Urology   Reason for Consult:  Nephrolithiasis  HPI: Phyllis House is seen in consultation for reasons noted above at the request of Charlane Ferretti, DO for evaluation of nephrolithiasis. Patient presented to [facility] ED yesterday for evaluation of right flank pain. She was noted to have right distal ureterolithiasis with upstream hydronephrosis. She was discharged on MET with multimodal analgesia with plans for outpatient follow-up. Unfortunately, her pain recurred while trialing MET and she returned to the ED today for further evaluation.  No fevers. Endorses nausea, vomiting, poor po intake. She is voiding spontaneously, though feels it's less frequency than usual.  Patient is well-known to the [facility] and follows with Dr. Mena Goes for management of her stones.  Past Medical History: Past Medical History:  Diagnosis Date   Anxiety    Depression    Eczema    H/O varicella    Hematuria    History of herpes genitalis    History of kidney stones    pt has long hx calcium phosphate stones   Hypocalcemia    Pneumonia    once 10+ yeaers ago   Polycystic ovarian syndrome    PONV (postoperative nausea and vomiting)    Renal tubular acidosis type I    Right ureteral stone    Seasonal allergies    Urgency of urination     Past Surgical History:  Past Surgical History:  Procedure Laterality Date   CYSTOSCOPY W/ RETROGRADES Right 12/12/2019   Procedure: CYSTOSCOPY WITH RIGHT RETROGRADE PYELOGRAM AND STENT PLACEMENT;  Surgeon: Heloise Purpura, MD;  Location: [facility];  Service: Urology;  Laterality: Right;   CYSTOSCOPY W/ URETERAL STENT PLACEMENT Left 07/01/2012   Procedure: CYSTOSCOPY WITH RETROGRADE PYELOGRAM/URETERAL STENT PLACEMENT;  Surgeon: Milford Cage, MD;  Location: [facility];  Service: Urology;  Laterality: Left;   CYSTOSCOPY W/ URETERAL STENT PLACEMENT Right  07/27/2016   Procedure: CYSTOSCOPY WITH RETROGRADE PYELOGRAM/URETERAL STENT PLACEMENT;  Surgeon: Alfredo Martinez, MD;  Location: [facility];  Service: Urology;  Laterality: Right;   CYSTOSCOPY W/ URETERAL STENT PLACEMENT Right 05/08/2019   Procedure: CYSTOSCOPY WITH RETROGRADE PYELOGRAM/URETERAL STENT PLACEMENT;  Surgeon: Malen Gauze, MD;  Location: [facility];  Service: Urology;  Laterality: Right;   CYSTOSCOPY W/ URETERAL STENT PLACEMENT Right 03/05/2021   Procedure: CYSTOURETEROSCOPY WITH RETROGRADE PYELOGRAM/URETERAL STENT PLACEMENT RIGHT;  Surgeon: Malen Gauze, MD;  Location: [facility];  Service: Urology;  Laterality: Right;   CYSTOSCOPY WITH RETROGRADE PYELOGRAM, URETEROSCOPY AND STENT PLACEMENT Left 07/15/2012   Procedure: CYSTOSCOPY WITH left RETROGRADE PYELOGRAM,left  URETEROSCOPY AND left STENT exchange ;  Surgeon: Milford Cage, MD;  Location: [facility];  Service: Urology;  Laterality: Left;  LEFT URETER STENT EXCHANGE     CYSTOSCOPY WITH RETROGRADE PYELOGRAM, URETEROSCOPY AND STENT PLACEMENT Right 08/11/2016   Procedure: CYSTOSCOPY WITH RIGHT RETROGRADE PYELOGRAM,RIGHT  URETEROSCOPY AND STENT CHANGE;  Surgeon: Ihor Gully, MD;  Location: [facility];  Service: Urology;  Laterality: Right;   CYSTOSCOPY WITH URETEROSCOPY AND STENT PLACEMENT Right 07/12/2021   Procedure: CYSTOSCOPY WITH RIGHT URETEROSCOPY AND STENT PLACEMENT;  Surgeon: [name], MD;  Location: [facility];  Service: Urology;  Laterality: Right;   CYSTOSCOPY/RETROGRADE/URETEROSCOPY/STONE EXTRACTION WITH BASKET  AGE 24   CYSTOSCOPY/URETEROSCOPY/HOLMIUM LASER/STENT PLACEMENT Bilateral 09/19/2018   Procedure: CYSTOSCOPY/RETROGRADE/URETEROSCOPY/HOLMIUM LASER/STENT PLACEMENT;  Surgeon: [name], MD;  Location: [facility];  Service: Urology;  Laterality: Bilateral;   CYSTOSCOPY/URETEROSCOPY/HOLMIUM LASER/STENT PLACEMENT Right  10/22/2018   Procedure:  CYSTOSCOPY/RETROGRADE/URETEROSCOPY/HOLMIUM LASER/STENT PLACEMENT;  Surgeon: [name], MD;  Location: [facility];  Service: Urology;  Laterality: Right;  ONLY NEEDS 60 MIN   CYSTOSCOPY/URETEROSCOPY/HOLMIUM LASER/STENT PLACEMENT Right 02/11/2019   Procedure: CYSTOSCOPY/RETROGRADE/URETEROSCOPY/HOLMIUM LASER/STENT PLACEMENT;  Surgeon: [name], MD;  Location: [facility];  Service: Urology;  Laterality: Right;   CYSTOSCOPY/URETEROSCOPY/HOLMIUM LASER/STENT PLACEMENT Bilateral 06/03/2019   Procedure: CYSTOSCOPY/RETROGRADE/URETEROSCOPY/HOLMIUM LASER/STENT PLACEMENT;  Surgeon: [name], MD;  Location: [facility];  Service: Urology;  Laterality: Bilateral;   CYSTOSCOPY/URETEROSCOPY/HOLMIUM LASER/STENT PLACEMENT Right 12/30/2019   Procedure: CYSTOSCOPY, RETROGRADE /URETEROSCOPY/HOLMIUM LASER/STENT EXCHANGE;  Surgeon: [name], MD;  Location: [facility];  Service: Urology;  Laterality: Right;   EXTRACORPOREAL SHOCK WAVE LITHOTRIPSY Left 08/07/2019   Procedure: EXTRACORPOREAL SHOCK WAVE LITHOTRIPSY (ESWL);  Surgeon: Noel Christmas, MD;  Location: [facility];  Service: Urology;  Laterality: Left;   EXTRACORPOREAL SHOCK WAVE LITHOTRIPSY Left 04/08/2020   Procedure: EXTRACORPOREAL SHOCK WAVE LITHOTRIPSY (ESWL);  Surgeon: Belva Agee, MD;  Location: [facility];  Service: Urology;  Laterality: Left;   EXTRACORPOREAL SHOCK WAVE LITHOTRIPSY Right 12/16/2020   Procedure: EXTRACORPOREAL SHOCK WAVE LITHOTRIPSY (ESWL);  Surgeon: Alfredo Martinez, MD;  Location: [facility];  Service: Urology;  Laterality: Right;   HOLMIUM LASER APPLICATION Left 07/15/2012   Procedure: HOLMIUM LASER APPLICATION;  Surgeon: Milford Cage, MD;  Location: [facility];  Service: Urology;  Laterality: Left;   HOLMIUM LASER APPLICATION Right 08/11/2016   Procedure:  HOLMIUM LASER APPLICATION;  Surgeon: Ihor Gully, MD;  Location: [facility];  Service: Urology;  Laterality: Right;   HOLMIUM LASER APPLICATION Right 02/11/2019   Procedure: HOLMIUM LASER APPLICATION;  Surgeon: [name], MD;  [facility];  Service: Urology;  Laterality: Right;   TONSILLECTOMY  AS CHILD   URETEROSCOPY      Medication: No current facility-administered medications for this encounter.   Current Outpatient Medications  Medication Sig Dispense Refill   levonorgestrel (MIRENA) 20 MCG/24HR IUD 1 each by Intrauterine route once. Implanted November 2019     ondansetron (ZOFRAN) 4 MG tablet Take 1 tablet (4 mg total) by mouth every 6 (six) hours. 20 tablet 0   oxyCODONE-acetaminophen (PERCOCET/ROXICET) 5-325 MG tablet Take 1 tablet by mouth every 6 (six) hours as needed for severe pain. 15 tablet 0   tamsulosin (FLOMAX) 0.4 MG CAPS capsule Take 0.4 mg by mouth daily.     zonisamide (ZONEGRAN) 100 MG capsule Take 100 mg by mouth at bedtime.     ferrous sulfate 325 (65 FE) MG tablet Take 1 tablet (325 mg total) by mouth daily. (Patient not taking: Reported on 01/09/2022) 30 tablet 0   HYDROcodone-acetaminophen (NORCO/VICODIN) 5-325 MG tablet Take 1 tablet by mouth every 6 (six) hours as needed. (Patient not taking: Reported on 01/09/2022) 8 tablet 0   ondansetron (ZOFRAN-ODT) 4 MG disintegrating tablet Take 1 tablet (4 mg total) by mouth every 8 (eight) hours as needed for nausea or vomiting. (Patient not taking: Reported on 01/09/2022) 20 tablet 0   potassium chloride SA (KLOR-CON M) 20 MEQ tablet Take 2 tablets (40 mEq total) by mouth daily for 5 days. (Patient not taking: Reported on 10/15/2022) 10 tablet 0   senna-docusate (SENOKOT-S) 8.6-50 MG tablet Take 1 tablet by mouth daily. (Patient not taking: Reported on 01/09/2022) 20 tablet 0    Allergies: Allergies  Allergen Reactions   Adhesive [Tape] Rash  Pt states paper tape causes  rash, but "plastic tape" is not problematic.     Social History: Social History   Tobacco Use   Smoking status: Never   Smokeless tobacco: Never  Vaping Use   Vaping Use: Never used  Substance Use Topics   Alcohol use: Yes    Comment: occasional   Drug use: No    Family History Family History  Problem Relation Age of Onset   Thyroid disease Mother    Depression Mother    Cancer Father        leaukemia   Thyroid disease Brother    Cancer Maternal Grandmother        GI   Diabetes Maternal Grandfather    Heart disease Paternal Grandmother    Diabetes Maternal Aunt     Review of Systems 10 systems were reviewed and are negative except as noted specifically in the HPI.  Objective   Vital signs in last 24 hours: BP 105/73   Pulse (!) 59   Temp 98.4 F (36.9 C) (Oral)   Resp 20   Ht 5\' 2"  (1.575 m)   Wt 81 kg   SpO2 100%   BMI 32.66 kg/m   Physical Exam General: NAD, A&O, resting, appropriate HEENT: [place]/AT, EOMI, MMM Pulmonary: Normal work of breathing Cardiovascular: HDS, adequate peripheral perfusion Abdomen: Soft, NTTP, nondistended GU: Voiding spontaneously Extremities: warm and well perfused Neuro: Appropriate, no focal neurological deficits  Most Recent Labs: Lab Results  Component Value Date   WBC 6.7 10/15/2022   HGB 9.4 (L) 10/15/2022   HCT 31.8 (L) 10/15/2022   PLT 159 10/15/2022    Lab Results  Component Value Date   NA 134 (L) 10/15/2022   K 3.2 (L) 10/15/2022   CL 113 (H) 10/15/2022   CO2 15 (L) 10/15/2022   BUN 12 10/15/2022   CREATININE 1.01 (H) 10/15/2022   CALCIUM 8.2 (L) 10/15/2022   MG 1.8 03/14/2013    Lab Results  Component Value Date   INR 1.1 11/16/2021     Urine Culture: @[id number] (laburin,org,[id number])@   IMAGING: CT Renal Stone Study  Result Date: 10/14/2022 CLINICAL DATA:  Left-sided flank pain, initial encounter EXAM: CT ABDOMEN AND PELVIS WITHOUT CONTRAST TECHNIQUE: Multidetector CT imaging of the  abdomen and pelvis was performed following the standard protocol without IV contrast. RADIATION DOSE REDUCTION: This exam was performed according to the departmental dose-optimization program which includes automated exposure control, adjustment of the mA and/or kV according to patient size and/or use of iterative reconstruction technique. COMPARISON:  06/03/2021 FINDINGS: Lower chest: No acute abnormality. Hepatobiliary: No focal liver abnormality is seen. No gallstones, gallbladder wall thickening, or biliary dilatation. Pancreas: Unremarkable. No pancreatic ductal dilatation or surrounding inflammatory changes. Spleen: Normal in size without focal abnormality. Adrenals/Urinary Tract: Adrenal glands are within normal limits. Kidneys again demonstrate multiple renal calculi. The largest of these on the left measures 8 mm in the lower pole. The largest of these on the right measures 6 mm in the lower pole. Fullness of the renal pelvis and right ureter is seen. This extends inferiorly to the distal right ureter just above the ureterovesical junction. Multiple ureteral stones are identified in the distal right ureter causing the obstructive change. The bladder is decompressed. Stomach/Bowel: No obstructive or inflammatory changes of colon are noted. The appendix is within normal limits. Small bowel and stomach are unremarkable. Vascular/Lymphatic: No significant vascular findings are present. No enlarged abdominal or pelvic lymph nodes. Reproductive: Uterus and bilateral adnexa  are unremarkable. IUD is noted in place. Other: No abdominal wall hernia or abnormality. No abdominopelvic ascites. Musculoskeletal: No acute or significant osseous findings. IMPRESSION: Considerable right hydronephrosis and hydroureter secondary to multiple distal right ureteral stones. The largest of these appears to measure 7 mm. Multiple bilateral renal calculi as described above. No left-sided hydronephrosis is seen. Electronically Signed    By: Alcide Clever M.D.   On: 10/14/2022 19:14    ------  Assessment:  47 y.o. female with PMH of nephrolithiasis who presented with right flank pain, nausea, vomiting and CT that demonstrates ~28mm x 7mm cumulative distal right ureteral obstructing stone burden with associated hydronephrosis.   Patient trialed outpatient MET with PRN analgesia but failed and re-presented to the ED today. She is nontoxic and her UA, while contaminated, is not frankly suggestive of infection. Discussed with patient that decompression could be considered in the setting of intractable pain. We discussed that stenting past an ~2cm segment of obstructed ureter could be technically challenging and risk damage to the ureter. We discussed she may be better served with a nephrostomy tube with IR until her stones could be definitively managed. She expressed understanding and was in agreement with that plan. I have discussed with IR this evening and they will plan to get patient on the schedule for tomorrow for R PCN placement. In order to avoid delay/cancellation, please hold any blood thinners (I.e. heparin/LVX) and keep patient NPO at MN.  Recommendations: - R PCN with IR tomorrow, 6/17; Urology has already discussed with IR and order placed for R PCN - Please make patient NPO at MN - Hold all blood thinners (I.e. no heparin, LVX) - Strain all voided urine - Multimodal analgesia, antiemetics PRN - Flomax 0.4mg  at bedtime - Urology will continue to follow  Thank you for this consult. Please contact the urology consult pager with any further questions/concerns.  If patient develops fever or hemodynamic instability overnight, please page Urology to discuss more expedited intervention.

## 2022-10-15 NOTE — ED Triage Notes (Signed)
Pt arrived via POV. C/o pain in LL pelvic area. Extensive kidney stone hx. Pt seen yesterday for same.

## 2022-10-15 NOTE — H&P (Signed)
History and Physical    Phyllis House WGN:[id number] DOB: [birth year] DOA: 10/15/2022  PCP: Ollen Bowl, MD  Patient coming from: home  I have personally briefly reviewed patient's old medical records in [facility] Chief Complaint: intractable RLQ/pelvic pain to flank  HPI: Phyllis House is a 47 y.o. 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  She was actually here yesterday for something similar.  She presents with left Right pelvic area pain - Actually yesterday had a right lower pelvis pain, nausea, dysuria, couple blood work abnormalities and UA was with moderate hemoglobin.  She had a CT renal stone study which showed multiple obstructing stones on the right with hydronephrosis largest stone 7 mm.  It was felt she could follow-up outpatient and she was discharged with pain, nausea and instructed to take Flomax.  She returns after having N/v and difficulty with pain control with similar RLQ/pelvic pain/flank pain.  Pt states that she passes a lot of kidney stones, even 2 last week.  She is not the best at avoiding calcium oxalate foods.  She states this time was different, sudden and quite severe.  Ibuprofen typically helps at home.  She doesn't take opioids daily.  Denies any other signs of UTI.  Doesn't think she is pregnant because she has IUD. Denies new sexual partner or new vaginal discharge.  Per review on [facility], she has trialed multiple medicines "including indapamide, hydrochlorothiazide, and potassium citrate. Most recently discontinued potassium citrate as she could not tolerate it"   In the emergency department 120/98, 100% on room air, HR 69, RR 20.  NA 134, K3.2, CL 113, CO2 15, glucose 131, creatinine 1.01, calcium 8.2, WBC 6.7, Hgb 9.4, PLT 159 Lipase yesterday was 37 CT scan yesterday on 10/14/2022 showed considerable right hydronephrosis and hydroureter  secondary  to multiple distal right ureteral stones the largest of these appears to measure 7 mm ED provider talked with urology who likely will plan a procedure for tomorrow.  Will try to keep NPO at midnight, hep ppx    Review of Systems: As per HPI otherwise 10 point review of systems negative.  Other pertinents as below:  General - denies f/c,  HEENT - denies new ha's or visual changes Cardio - denies cp, palpitations Resp - denies cough, recent RI, denies sob GI - denies n/v/d/GI pain, heamtochezia or melena, or heart burn GU - as per  hpi, denies any dysuria or feelings that she has a UTI MSK - denies new joint or muscle pain Skin - denies skin rashes or lesions  Neuro - denies new numbness or weakness Psych - a little anxious, but not depressed   Past Medical History:  Diagnosis Date   Anxiety    Depression    Eczema    H/O varicella    Hematuria    History of herpes genitalis    History of kidney stones    pt has long hx calcium phosphate stones   Hypocalcemia    Pneumonia    once 10+ yeaers ago   Polycystic ovarian syndrome    PONV (postoperative nausea and vomiting)    Renal tubular acidosis type I    Right ureteral stone    Seasonal allergies    Urgency of urination     Past Surgical History:  Procedure Laterality Date   CYSTOSCOPY W/ RETROGRADES Right 12/12/2019   Procedure: CYSTOSCOPY WITH RIGHT RETROGRADE  PYELOGRAM AND STENT PLACEMENT;  Surgeon: Heloise Purpura, MD;  Location: [facility];  Service: Urology;  Laterality: Right;   CYSTOSCOPY W/ URETERAL STENT PLACEMENT Left 07/01/2012   Procedure: CYSTOSCOPY WITH RETROGRADE PYELOGRAM/URETERAL STENT PLACEMENT;  Surgeon: Milford Cage, MD;  Location: [facility];  Service: Urology;  Laterality: Left;   CYSTOSCOPY W/ URETERAL STENT PLACEMENT Right 07/27/2016   Procedure: CYSTOSCOPY WITH RETROGRADE PYELOGRAM/URETERAL STENT PLACEMENT;  Surgeon: Alfredo Martinez, MD;  Location: [facility];  Service: Urology;  Laterality:  Right;   CYSTOSCOPY W/ URETERAL STENT PLACEMENT Right 05/08/2019   Procedure: CYSTOSCOPY WITH RETROGRADE PYELOGRAM/URETERAL STENT PLACEMENT;  Surgeon: Malen Gauze, MD;  Location: [facility];  Service: Urology;  Laterality: Right;   CYSTOSCOPY W/ URETERAL STENT PLACEMENT Right 03/05/2021   Procedure: CYSTOURETEROSCOPY WITH RETROGRADE PYELOGRAM/URETERAL STENT PLACEMENT RIGHT;  Surgeon: Malen Gauze, MD;  Location: [facility];  Service: Urology;  Laterality: Right;   CYSTOSCOPY WITH RETROGRADE PYELOGRAM, URETEROSCOPY AND STENT PLACEMENT Left 07/15/2012   Procedure: CYSTOSCOPY WITH left RETROGRADE PYELOGRAM,left  URETEROSCOPY AND left STENT exchange ;  Surgeon: Milford Cage, MD;  Location: [facility];  Service: Urology;  Laterality: Left;  LEFT URETER STENT EXCHANGE     CYSTOSCOPY WITH RETROGRADE PYELOGRAM, URETEROSCOPY AND STENT PLACEMENT Right 08/11/2016   Procedure: CYSTOSCOPY WITH RIGHT RETROGRADE PYELOGRAM,RIGHT  URETEROSCOPY AND STENT CHANGE;  Surgeon: Ihor Gully, MD;  Location: [facility];  Service: Urology;  Laterality: Right;   CYSTOSCOPY WITH URETEROSCOPY AND STENT PLACEMENT Right 07/12/2021   Procedure: CYSTOSCOPY WITH RIGHT URETEROSCOPY AND STENT PLACEMENT;  Surgeon: [name], MD;  Location: [facility];  Service: Urology;  Laterality: Right;   CYSTOSCOPY/RETROGRADE/URETEROSCOPY/STONE EXTRACTION WITH BASKET  AGE 11   CYSTOSCOPY/URETEROSCOPY/HOLMIUM LASER/STENT PLACEMENT Bilateral 09/19/2018   Procedure: CYSTOSCOPY/RETROGRADE/URETEROSCOPY/HOLMIUM LASER/STENT PLACEMENT;  Surgeon: [name], MD;  Location: [facility];  Service: Urology;  Laterality: Bilateral;   CYSTOSCOPY/URETEROSCOPY/HOLMIUM LASER/STENT PLACEMENT Right 10/22/2018   Procedure: CYSTOSCOPY/RETROGRADE/URETEROSCOPY/HOLMIUM LASER/STENT PLACEMENT;  Surgeon: [name], MD;  Location: [facility];  Service: Urology;  Laterality: Right;  ONLY NEEDS 60 MIN    CYSTOSCOPY/URETEROSCOPY/HOLMIUM LASER/STENT PLACEMENT Right 02/11/2019   Procedure: CYSTOSCOPY/RETROGRADE/URETEROSCOPY/HOLMIUM LASER/STENT PLACEMENT;  Surgeon: [name], MD;  Location: [facility];  Service: Urology;  Laterality: Right;   CYSTOSCOPY/URETEROSCOPY/HOLMIUM LASER/STENT PLACEMENT Bilateral 06/03/2019   Procedure: CYSTOSCOPY/RETROGRADE/URETEROSCOPY/HOLMIUM LASER/STENT PLACEMENT;  Surgeon: [name], MD;  Location: [facility];  Service: Urology;  Laterality: Bilateral;   CYSTOSCOPY/URETEROSCOPY/HOLMIUM LASER/STENT PLACEMENT Right 12/30/2019   Procedure: CYSTOSCOPY, RETROGRADE /URETEROSCOPY/HOLMIUM LASER/STENT EXCHANGE;  Surgeon: [name], MD;  Location: [facility];  Service: Urology;  Laterality: Right;   EXTRACORPOREAL SHOCK WAVE LITHOTRIPSY Left 08/07/2019   Procedure: EXTRACORPOREAL SHOCK WAVE LITHOTRIPSY (ESWL);  Surgeon: Noel Christmas, MD;  [facility]  Service: Urology;  Laterality: Left;   EXTRACORPOREAL SHOCK WAVE LITHOTRIPSY Left 04/08/2020   Procedure: EXTRACORPOREAL SHOCK WAVE LITHOTRIPSY (ESWL);  Surgeon: Belva Agee, MD;  Location: [facility];  Service: Urology;  Laterality: Left;   EXTRACORPOREAL SHOCK WAVE LITHOTRIPSY Right 12/16/2020   Procedure: EXTRACORPOREAL SHOCK WAVE LITHOTRIPSY (ESWL);  Surgeon: Alfredo Martinez, MD;  Location: [facility];  Service: Urology;  Laterality: Right;   HOLMIUM LASER APPLICATION Left 07/15/2012   Procedure: HOLMIUM LASER APPLICATION;  Surgeon: Milford Cage, MD;  Location: [facility];  Service: Urology;  Laterality: Left;   HOLMIUM LASER APPLICATION Right 08/11/2016   Procedure: HOLMIUM LASER APPLICATION;  Surgeon: Ihor Gully, MD;  Location: [facility];  Service: Urology;  Laterality: Right;   HOLMIUM LASER APPLICATION Right 02/11/2019   Procedure: HOLMIUM  LASER APPLICATION;  Surgeon: [name], MD;  Location: [facility];  Service: Urology;  Laterality: Right;   TONSILLECTOMY  AS CHILD   URETEROSCOPY       reports that she has never smoked. She has never used smokeless tobacco. She reports current alcohol use. She reports that she does not use drugs.  Allergies  Allergen Reactions   Adhesive [Tape] Rash    Pt states paper tape causes rash, but "plastic tape" is not problematic.     Family History  Problem Relation Age of Onset   Thyroid disease Mother    Depression Mother    Cancer Father        leaukemia   Thyroid disease Brother    Cancer Maternal Grandmother        GI   Diabetes Maternal Grandfather    Heart disease Paternal Grandmother    Diabetes Maternal Aunt      Prior to Admission medications   Medication Sig Start Date End Date Taking? Authorizing Provider  tiZANidine (ZANAFLEX) 4 MG tablet Take 4 mg by mouth 2 (two) times daily as needed for muscle spasms. 09/27/22  Yes [provider]  zonisamide (ZONEGRAN) 100 MG capsule Take 100 mg by mouth at bedtime. 10/05/22  Yes [provider]  ferrous sulfate 325 (65 FE) MG tablet Take 1 tablet (325 mg total) by mouth daily. Patient not taking: Reported on 01/09/2022 11/01/21   Long, Arlyss Repress, MD  HYDROcodone-acetaminophen (NORCO/VICODIN) 5-325 MG tablet Take 1 tablet by mouth every 6 (six) hours as needed. Patient not taking: Reported on 01/09/2022 07/12/21   [name], MD  levonorgestrel (MIRENA) 20 MCG/24HR IUD 1 each by Intrauterine route once. Implanted November 2019    [provider]  Norethin Ace-Eth Estrad-FE (LOESTRIN 24 FE PO) Take 1 tablet by mouth daily.    [provider]  ondansetron (ZOFRAN) 4 MG tablet Take 1 tablet (4 mg total) by mouth every 6 (six) hours. 10/14/22   Prosperi, Christian H, PA-C  ondansetron (ZOFRAN-ODT) 4 MG disintegrating tablet Take 1 tablet (4 mg total) by mouth every 8 (eight)  hours as needed for nausea or vomiting. Patient not taking: Reported on 01/09/2022 11/01/21   Long, Arlyss Repress, MD  oxyCODONE-acetaminophen (PERCOCET/ROXICET) 5-325 MG tablet Take 1 tablet by mouth every 6 (six) hours as needed for severe pain. 10/14/22   Prosperi, Christian H, PA-C  potassium chloride SA (KLOR-CON M) 20 MEQ tablet Take 2 tablets (40 mEq total) by mouth daily for 5 days. 11/01/21 11/06/21  Long, Arlyss Repress, MD  potassium citrate (UROCIT-K) 10 MEQ (1080 MG) SR tablet Take 10 mEq by mouth 2 (two) times daily with a meal.    [provider]  senna-docusate (SENOKOT-S) 8.6-50 MG tablet Take 1 tablet by mouth daily. Patient not taking: Reported on 01/09/2022 11/01/21   Maia Plan, MD    Physical Exam: Vitals:   10/15/22 1847 10/15/22 1849 10/15/22 1900  BP: 112/68  105/73  Pulse: 69  (!) 59  Resp: 18  20  Temp: 98.4 F (36.9 C)    TempSrc: Oral    SpO2: 100%  100%  Weight:  81 kg   Height:  5\' 2"  (1.575 m)     Constitutional: NAD, comfortable, very pleasant Eyes: pupils equal and reactive to light, anicteric, without injection ENMT: MMM, throat without  exudates or erythema Neck: normal, supple, no masses, no thyromegaly noted Respiratory: CTAB, nwob,    Cardiovascular: rrr w/o mrg, warm extremities Abdomen: NBS, NT, soft but some RLQ/pelvic pain and mild guarding, no CVA tenderness bilaterally Musculoskeletal: moving all 4 extremities, strength grossly intact 5/5 in the UE and LE's,   Skin: no rashes, lesions, ulcers. No induration Neurologic: CN 2-12 grossly intact. Sensation intact Psychiatric: AO appearing, mentation appropriate    Labs on Admission: I have personally reviewed following labs and imaging studies  CBC: Recent Labs  Lab 10/14/22 1645 10/15/22 1940  WBC 4.6 6.7  NEUTROABS 3.1  --   HGB 9.6* 9.4*  HCT 32.8* 31.8*  MCV 69.5* 69.7*  PLT 175 159   Basic Metabolic Panel: Recent Labs  Lab 10/14/22 1645 10/15/22 1940  NA 135 134*  K 3.4* 3.2*   CL 115* 113*  CO2 16* 15*  GLUCOSE 92 131*  BUN 11 12  CREATININE 0.97 1.01*  CALCIUM 8.1* 8.2*   GFR: Estimated Creatinine Clearance: 67.9 mL/min (A) (by C-G formula based on SCr of 1.01 mg/dL (H)). Liver Function Tests: Recent Labs  Lab 10/14/22 1645  AST 14*  ALT 12  ALKPHOS 50  BILITOT 0.5  PROT 6.4*  ALBUMIN 3.4*   Recent Labs  Lab 10/14/22 1645  LIPASE 37   No results for input(s): "AMMONIA" in the last 168 hours. Coagulation Profile: No results for input(s): "INR", "PROTIME" in the last 168 hours. Cardiac Enzymes: No results for input(s): "CKTOTAL", "CKMB", "CKMBINDEX", "TROPONINI" in the last 168 hours. BNP (last 3 results) No results for input(s): "PROBNP" in the last 8760 hours. HbA1C: No results for input(s): "HGBA1C" in the last 72 hours. CBG: No results for input(s): "GLUCAP" in the last 168 hours. Lipid Profile: No results for input(s): "CHOL", "HDL", "LDLCALC", "TRIG", "CHOLHDL", "LDLDIRECT" in the last 72 hours. Thyroid Function Tests: No results for input(s): "TSH", "T4TOTAL", "FREET4", "T3FREE", "THYROIDAB" in the last 72 hours. Anemia Panel: No results for input(s): "VITAMINB12", "FOLATE", "FERRITIN", "TIBC", "IRON", "RETICCTPCT" in the last 72 hours. Urine analysis:    Component Value Date/Time   COLORURINE YELLOW 10/15/2022 1925   APPEARANCEUR HAZY (A) 10/15/2022 1925   LABSPEC 1.013 10/15/2022 1925   PHURINE 6.0 10/15/2022 1925   GLUCOSEU NEGATIVE 10/15/2022 1925   HGBUR NEGATIVE 10/15/2022 1925   BILIRUBINUR NEGATIVE 10/15/2022 1925   KETONESUR NEGATIVE 10/15/2022 1925   PROTEINUR NEGATIVE 10/15/2022 1925   UROBILINOGEN 0.2 09/28/2014 0945   NITRITE NEGATIVE 10/15/2022 1925   LEUKOCYTESUR LARGE (A) 10/15/2022 1925    Radiological Exams on Admission: CT Renal Stone Study  Result Date: 10/14/2022 CLINICAL DATA:  Left-sided flank pain, initial encounter EXAM: CT ABDOMEN AND PELVIS WITHOUT CONTRAST TECHNIQUE: Multidetector CT imaging  of the abdomen and pelvis was performed following the standard protocol without IV contrast. RADIATION DOSE REDUCTION: This exam was performed according to the departmental dose-optimization program which includes automated exposure control, adjustment of the mA and/or kV according to patient size and/or use of iterative reconstruction technique. COMPARISON:  06/03/2021 FINDINGS: Lower chest: No acute abnormality. Hepatobiliary: No focal liver abnormality is seen. No gallstones, gallbladder wall thickening, or biliary dilatation. Pancreas: Unremarkable. No pancreatic ductal dilatation or surrounding inflammatory changes. Spleen: Normal in size without focal abnormality. Adrenals/Urinary Tract: Adrenal glands are within normal limits. Kidneys again demonstrate multiple renal calculi. The largest of these on the left measures 8 mm in the lower pole. The largest of these on the right measures 6 mm in the  lower pole. Fullness of the renal pelvis and right ureter is seen. This extends inferiorly to the distal right ureter just above the ureterovesical junction. Multiple ureteral stones are identified in the distal right ureter causing the obstructive change. The bladder is decompressed. Stomach/Bowel: No obstructive or inflammatory changes of colon are noted. The appendix is within normal limits. Small bowel and stomach are unremarkable. Vascular/Lymphatic: No significant vascular findings are present. No enlarged abdominal or pelvic lymph nodes. Reproductive: Uterus and bilateral adnexa are unremarkable. IUD is noted in place. Other: No abdominal wall hernia or abnormality. No abdominopelvic ascites. Musculoskeletal: No acute or significant osseous findings. IMPRESSION: Considerable right hydronephrosis and hydroureter secondary to multiple distal right ureteral stones. The largest of these appears to measure 7 mm. Multiple bilateral renal calculi as described above. No left-sided hydronephrosis is seen. Electronically  Signed   By: Alcide Clever M.D.   On: 10/14/2022 19:14    EKG: na  Assessment/Plan Principal Problem:   Hydronephrosis Active Problems:   Hypokalemia   Anxiety   Obesity   Renal tubular acidosis type I  #Hydronephrosis from the biggest is a 7mm stone.   "Considerable right hydronephrosis and hydroureter secondary to multiple distal right ureteral stones. The largest of these appears to measure 7 mm." - Rule out pregnancy, has IUD , seems like kidney stones but keep eyes out for other things. -- urology consulted here will keep NPO at midnight.  Hep ppx, --INR, TNS'ed --holding on abx but low threshold to start. follow culture - upon discharge Follow-up outpatient with urology, Dr. Mena Goes at [facility] and Dr. Mellody Life at [facility] for kidney stones and RTA- --encouraged her to stay hydrated and try to avoid oxalate foods. --pain control - toradol was fairly successful at pain control so will contoinue this prn q6hr and severe pain to do home percocet at q4 instead of q6 which constipates her. So miralax prn --Nausea - zofran prn, qtc in 2023 was 415  #Anemia, baseline around 7-8, 9.6-here, b12 and iron deficiency, blood transfusion 12/2021, will go ahead and type and cross.  would encourage iron and b12 in the outpatient.  States that iron supplements constipate her and encouraged her to try ferrous gluconate and then accrufer and consider iron infusion.   #HA's, has been working with outpatient headache specialist, will continue zonisamiade #Acidosis, bicarb 16, think related to RTA #Hypokalemia - replace as able  Pt completely agreed with the plan, expressed understanding and I answered all questions.  DVT prophylaxis: Heparin SQ Code Status: Full code Family Communication: na Consults called: urology Dr. Laymond Purser (with names) Admission status: observation for now     A total of 76 minutes utilized during this admission.  Charlane Ferretti DO [facility]   If 7PM-7AM,  please contact night-coverage [email or web address] Password [email or web address]  10/15/2022, 10:00 PM

## 2022-10-15 NOTE — ED Provider Notes (Signed)
[facility] AT [facility] Provider Note   CSN: [id number] Arrival date & time: 10/15/22  1814     History  Chief Complaint  Patient presents with   Flank Pain    Phyllis House is a 47 y.o. female.  Patient with past medical history significant for renal tubular acidosis, anxiety, frequent kidney stones who presents with ongoing left flank and pelvis pain, nausea and vomiting, stemming from right distal ureteral stones seen on CT scan yesterday.  Symptoms started yesterday early in the day.  She was improved after ED visit yesterday, but symptoms have been uncontrolled at home with oral oxycodone, tamsulosin.  She states that she called and spoke with urology office and was told to come into the emergency department and that we would call on-call urologist.  Patient denies fevers.  She states that she feels dehydrated because she cannot keep down fluids.  Pain is consistent with previous stones, which she has had a very extensive history.       Home Medications Prior to Admission medications   Medication Sig Start Date End Date Taking? Authorizing Provider  ferrous sulfate 325 (65 FE) MG tablet Take 1 tablet (325 mg total) by mouth daily. Patient not taking: Reported on 01/09/2022 11/01/21   Long, Arlyss Repress, MD  HYDROcodone-acetaminophen (NORCO/VICODIN) 5-325 MG tablet Take 1 tablet by mouth every 6 (six) hours as needed. Patient not taking: Reported on 01/09/2022 07/12/21   [name], MD  levonorgestrel (MIRENA) 20 MCG/24HR IUD 1 each by Intrauterine route once. Implanted November 2019    [provider]  Norethin Ace-Eth Estrad-FE (LOESTRIN 24 FE PO) Take 1 tablet by mouth daily.    [provider]  ondansetron (ZOFRAN) 4 MG tablet Take 1 tablet (4 mg total) by mouth every 6 (six) hours. 10/14/22   Prosperi, Christian H, PA-C  ondansetron (ZOFRAN-ODT) 4 MG disintegrating tablet Take 1 tablet (4 mg total) by mouth every 8 (eight)  hours as needed for nausea or vomiting. Patient not taking: Reported on 01/09/2022 11/01/21   Long, Arlyss Repress, MD  oxyCODONE-acetaminophen (PERCOCET/ROXICET) 5-325 MG tablet Take 1 tablet by mouth every 6 (six) hours as needed for severe pain. 10/14/22   Prosperi, Christian H, PA-C  potassium chloride SA (KLOR-CON M) 20 MEQ tablet Take 2 tablets (40 mEq total) by mouth daily for 5 days. 11/01/21 11/06/21  Long, Arlyss Repress, MD  potassium citrate (UROCIT-K) 10 MEQ (1080 MG) SR tablet Take 10 mEq by mouth 2 (two) times daily with a meal.    [provider]  senna-docusate (SENOKOT-S) 8.6-50 MG tablet Take 1 tablet by mouth daily. Patient not taking: Reported on 01/09/2022 11/01/21   Long, Arlyss Repress, MD      Allergies    Adhesive [tape]    Review of Systems   Review of Systems  Physical Exam Updated Vital Signs BP 112/68 (BP Location: Left Arm)   Pulse 69   Temp 98.4 F (36.9 C) (Oral)   Resp 18   Ht 5\' 2"  (1.575 m)   Wt 81 kg   SpO2 100%   BMI 32.66 kg/m  Physical Exam Vitals and nursing note reviewed.  Constitutional:      General: She is in acute distress (mildly uncomfortable).     Appearance: She is well-developed.  HENT:     Head: Normocephalic and atraumatic.     Right Ear: External ear normal.     Left Ear: External ear normal.  Nose: Nose normal.  Eyes:     Conjunctiva/sclera: Conjunctivae normal.  Cardiovascular:     Rate and Rhythm: Normal rate and regular rhythm.     Heart sounds: No murmur heard. Pulmonary:     Effort: No respiratory distress.     Breath sounds: No wheezing, rhonchi or rales.  Abdominal:     Palpations: Abdomen is soft.     Tenderness: There is abdominal tenderness. There is no right CVA tenderness, left CVA tenderness, guarding or rebound.  Musculoskeletal:     Cervical back: Normal range of motion and neck supple.     Right lower leg: No edema.     Left lower leg: No edema.  Skin:    General: Skin is warm and dry.     Findings: No rash.   Neurological:     General: No focal deficit present.     Mental Status: She is alert. Mental status is at baseline.     Motor: No weakness.  Psychiatric:        Mood and Affect: Mood normal.     ED Results / Procedures / Treatments   Labs (all labs ordered are listed, but only abnormal results are displayed) Labs Reviewed  URINALYSIS, ROUTINE W REFLEX MICROSCOPIC - Abnormal; Notable for the following components:      Result Value   APPearance HAZY (*)    Leukocytes,Ua LARGE (*)    Bacteria, UA RARE (*)    All other components within normal limits  CBC - Abnormal; Notable for the following components:   Hemoglobin 9.4 (*)    HCT 31.8 (*)    MCV 69.7 (*)    MCH 20.6 (*)    MCHC 29.6 (*)    RDW 18.7 (*)    All other components within normal limits  BASIC METABOLIC PANEL - Abnormal; Notable for the following components:   Sodium 134 (*)    Potassium 3.2 (*)    Chloride 113 (*)    CO2 15 (*)    Glucose, Bld 131 (*)    Creatinine, Ser 1.01 (*)    Calcium 8.2 (*)    All other components within normal limits  I-STAT BETA HCG BLOOD, ED ([facility], [facility], [facility] ONLY)    EKG None  Radiology CT Renal Stone Study  Result Date: 10/14/2022 CLINICAL DATA:  Left-sided flank pain, initial encounter EXAM: CT ABDOMEN AND PELVIS WITHOUT CONTRAST TECHNIQUE: Multidetector CT imaging of the abdomen and pelvis was performed following the standard protocol without IV contrast. RADIATION DOSE REDUCTION: This exam was performed according to the departmental dose-optimization program which includes automated exposure control, adjustment of the mA and/or kV according to patient size and/or use of iterative reconstruction technique. COMPARISON:  06/03/2021 FINDINGS: Lower chest: No acute abnormality. Hepatobiliary: No focal liver abnormality is seen. No gallstones, gallbladder wall thickening, or biliary dilatation. Pancreas: Unremarkable. No pancreatic ductal dilatation or surrounding inflammatory changes.  Spleen: Normal in size without focal abnormality. Adrenals/Urinary Tract: Adrenal glands are within normal limits. Kidneys again demonstrate multiple renal calculi. The largest of these on the left measures 8 mm in the lower pole. The largest of these on the right measures 6 mm in the lower pole. Fullness of the renal pelvis and right ureter is seen. This extends inferiorly to the distal right ureter just above the ureterovesical junction. Multiple ureteral stones are identified in the distal right ureter causing the obstructive change. The bladder is decompressed. Stomach/Bowel: No obstructive or inflammatory changes of colon are noted.  The appendix is within normal limits. Small bowel and stomach are unremarkable. Vascular/Lymphatic: No significant vascular findings are present. No enlarged abdominal or pelvic lymph nodes. Reproductive: Uterus and bilateral adnexa are unremarkable. IUD is noted in place. Other: No abdominal wall hernia or abnormality. No abdominopelvic ascites. Musculoskeletal: No acute or significant osseous findings. IMPRESSION: Considerable right hydronephrosis and hydroureter secondary to multiple distal right ureteral stones. The largest of these appears to measure 7 mm. Multiple bilateral renal calculi as described above. No left-sided hydronephrosis is seen. Electronically Signed   By: Alcide Clever M.D.   On: 10/14/2022 19:14    Procedures Procedures    Medications Ordered in ED Medications  ketorolac (TORADOL) 15 MG/ML injection 15 mg (has no administration in time range)  ondansetron (ZOFRAN) injection 4 mg (has no administration in time range)  sodium chloride 0.9 % bolus 1,000 mL (has no administration in time range)    ED Course/ Medical Decision Making/ A&P    Patient seen and examined. History obtained directly from patient.  I reviewed yesterday's emergency department visit note as well as imaging.  Labs/EKG: Ordered CBC, BMP, UA  Imaging: None  ordered  Medications/Fluids: Ordered: IV Toradol, IV Zofran, fluid bolus  Most recent vital signs reviewed and are as follows: BP 112/68 (BP Location: Left Arm)   Pulse 69   Temp 98.4 F (36.9 C) (Oral)   Resp 18   Ht 5\' 2"  (1.575 m)   Wt 81 kg   SpO2 100%   BMI 32.66 kg/m   Initial impression: Right-sided ureteral colic  8:59 PM Reassessment performed. Patient appears more comfortable after pain medication.  Labs personally reviewed and interpreted including: CBC showing normal white blood cell count, anemia with baseline hemoglobin of 9.4l; BMP with slightly low potassium at 3.2, chloride 113 minimally elevated, sodium 134, glucose 131, creatinine 1.01 stable.  Reviewed pertinent lab work and imaging with patient at bedside. Questions answered.   Most current vital signs reviewed and are as follows: BP 105/73   Pulse (!) 59   Temp 98.4 F (36.9 C) (Oral)   Resp 20   Ht 5\' 2"  (1.575 m)   Wt 81 kg   SpO2 100%   BMI 32.66 kg/m   Plan: I have consulted with Dr. Delanna Ahmadi, urology resident.  States that patient does not need to be emergently taken to the OR as she does not appear to be septic or have pyelonephritis, however she will need admission for pain control and urologic procedure in the morning.  This may be stenting versus nephrostomy.  He will consult on patient and request hospitalist admission.  9:21 PM Consulted with Dr. Thornell Mule, [facility], who will see.                              Medical Decision Making Amount and/or Complexity of Data Reviewed Labs: ordered.  Risk Prescription drug management.   Patient with right-sided ureteral stones, large stone burden.  She has tried controlling symptoms at home with narcotic pain medications, antiemetics, Flomax.  This has not gone well and has returned to the ED under advisement of her urologist.  Plan to admit for surgical treatment.  No evidence of pyelonephritis or sepsis at this time.        Final Clinical  Impression(s) / ED Diagnoses Final diagnoses:  Ureteral colic  Hydronephrosis with urinary obstruction due to ureteral calculus  Intractable pain    Rx /  DC Orders ED Discharge Orders     None         Renne Crigler, Cordelia Poche 10/15/22 2122    Charlynne Pander, MD 10/15/22 [phone]

## 2022-10-16 ENCOUNTER — Observation Stay (HOSPITAL_COMMUNITY): Payer: BC Managed Care – PPO

## 2022-10-16 DIAGNOSIS — N132 Hydronephrosis with renal and ureteral calculous obstruction: Secondary | ICD-10-CM | POA: Diagnosis not present

## 2022-10-16 HISTORY — PX: IR NEPHROSTOMY PLACEMENT RIGHT: IMG6064

## 2022-10-16 LAB — PROTIME-INR
INR: 1.1 (ref 0.8–1.2)
Prothrombin Time: 14.1 seconds (ref 11.4–15.2)

## 2022-10-16 LAB — CBC
HCT: 28.4 % — ABNORMAL LOW (ref 36.0–46.0)
Hemoglobin: 8.2 g/dL — ABNORMAL LOW (ref 12.0–15.0)
MCH: 20.2 pg — ABNORMAL LOW (ref 26.0–34.0)
MCHC: 28.9 g/dL — ABNORMAL LOW (ref 30.0–36.0)
MCV: 70 fL — ABNORMAL LOW (ref 80.0–100.0)
Platelets: 133 10*3/uL — ABNORMAL LOW (ref 150–400)
RBC: 4.06 MIL/uL (ref 3.87–5.11)
RDW: 18.6 % — ABNORMAL HIGH (ref 11.5–15.5)
WBC: 3.2 10*3/uL — ABNORMAL LOW (ref 4.0–10.5)
nRBC: 0 % (ref 0.0–0.2)

## 2022-10-16 LAB — COMPREHENSIVE METABOLIC PANEL
ALT: 11 U/L (ref 0–44)
AST: 11 U/L — ABNORMAL LOW (ref 15–41)
Albumin: 3.1 g/dL — ABNORMAL LOW (ref 3.5–5.0)
Alkaline Phosphatase: 45 U/L (ref 38–126)
Anion gap: 5 (ref 5–15)
BUN: 11 mg/dL (ref 6–20)
CO2: 13 mmol/L — ABNORMAL LOW (ref 22–32)
Calcium: 7.9 mg/dL — ABNORMAL LOW (ref 8.9–10.3)
Chloride: 118 mmol/L — ABNORMAL HIGH (ref 98–111)
Creatinine, Ser: 0.93 mg/dL (ref 0.44–1.00)
GFR, Estimated: >60 mL/min (ref >60)
Glucose, Bld: 107 mg/dL — ABNORMAL HIGH (ref 70–99)
Potassium: 3.5 mmol/L (ref 3.5–5.1)
Sodium: 136 mmol/L (ref 135–145)
Total Bilirubin: 0.4 mg/dL (ref 0.3–1.2)
Total Protein: 5.4 g/dL — ABNORMAL LOW (ref 6.5–8.1)

## 2022-10-16 LAB — BASIC METABOLIC PANEL
Anion gap: 6 (ref 5–15)
BUN: 10 mg/dL (ref 6–20)
CO2: 17 mmol/L — ABNORMAL LOW (ref 22–32)
Calcium: 8 mg/dL — ABNORMAL LOW (ref 8.9–10.3)
Chloride: 117 mmol/L — ABNORMAL HIGH (ref 98–111)
Creatinine, Ser: 1.05 mg/dL — ABNORMAL HIGH (ref 0.44–1.00)
GFR, Estimated: >60 mL/min (ref >60)
Glucose, Bld: 95 mg/dL (ref 70–99)
Potassium: 2.9 mmol/L — ABNORMAL LOW (ref 3.5–5.1)
Sodium: 140 mmol/L (ref 135–145)

## 2022-10-16 LAB — TYPE AND SCREEN
ABO/RH(D): O POS
Antibody Screen: NEGATIVE

## 2022-10-16 LAB — HIV ANTIBODY (ROUTINE TESTING W REFLEX): HIV Screen 4th Generation wRfx: NONREACTIVE

## 2022-10-16 MED ORDER — PROCHLORPERAZINE EDISYLATE 10 MG/2ML IJ SOLN
10.0000 mg | Freq: Four times a day (QID) | INTRAMUSCULAR | Status: DC | PRN
  Administered 2022-10-16: 10 mg via INTRAVENOUS
  Filled 2022-10-16: qty 2

## 2022-10-16 MED ORDER — IOHEXOL 300 MG/ML  SOLN
50.0000 mL | Freq: Once | INTRAMUSCULAR | Status: AC | PRN
  Administered 2022-10-16: 10 mL

## 2022-10-16 MED ORDER — FENTANYL CITRATE (PF) 100 MCG/2ML IJ SOLN
INTRAMUSCULAR | Status: AC | PRN
  Administered 2022-10-16 (×2): 50 ug via INTRAVENOUS

## 2022-10-16 MED ORDER — SODIUM CHLORIDE 0.9 % IV SOLN
1.0000 g | Freq: Once | INTRAVENOUS | Status: AC
  Administered 2022-10-17: 1 g via INTRAVENOUS
  Filled 2022-10-16: qty 10

## 2022-10-16 MED ORDER — SODIUM CHLORIDE 0.9 % IV SOLN
2.0000 g | Freq: Once | INTRAVENOUS | Status: AC
  Administered 2022-10-16: 2 g via INTRAVENOUS

## 2022-10-16 MED ORDER — LIDOCAINE-EPINEPHRINE 1 %-1:100000 IJ SOLN
20.0000 mL | Freq: Once | INTRAMUSCULAR | Status: AC
  Administered 2022-10-16: 4 mL via INTRADERMAL
  Filled 2022-10-16: qty 20

## 2022-10-16 MED ORDER — MIDAZOLAM HCL 2 MG/2ML IJ SOLN
INTRAMUSCULAR | Status: AC | PRN
  Administered 2022-10-16 (×2): 1 mg via INTRAVENOUS

## 2022-10-16 MED ORDER — MIDAZOLAM HCL 2 MG/2ML IJ SOLN
INTRAMUSCULAR | Status: AC
  Filled 2022-10-16: qty 4

## 2022-10-16 MED ORDER — LACTATED RINGERS IV SOLN: INTRAVENOUS | Status: AC

## 2022-10-16 MED ORDER — HEPARIN SODIUM (PORCINE) 5000 UNIT/ML IJ SOLN
5000.0000 [IU] | Freq: Three times a day (TID) | INTRAMUSCULAR | Status: DC
  Administered 2022-10-16: 5000 [IU] via SUBCUTANEOUS
  Filled 2022-10-16: qty 1

## 2022-10-16 MED ORDER — LIDOCAINE-EPINEPHRINE 1 %-1:100000 IJ SOLN
INTRAMUSCULAR | Status: AC
  Filled 2022-10-16: qty 1

## 2022-10-16 MED ORDER — SODIUM CHLORIDE 0.9 % IV SOLN
INTRAVENOUS | Status: AC
  Filled 2022-10-16: qty 20

## 2022-10-16 MED ORDER — SODIUM CHLORIDE 0.9% FLUSH
5.0000 mL | Freq: Three times a day (TID) | INTRAVENOUS | Status: DC
  Administered 2022-10-16 – 2022-10-17 (×4): 5 mL

## 2022-10-16 MED ORDER — FENTANYL CITRATE (PF) 100 MCG/2ML IJ SOLN
INTRAMUSCULAR | Status: AC
  Filled 2022-10-16: qty 4

## 2022-10-16 NOTE — Progress Notes (Signed)
  Subjective: 6/17: No acute events overnight.  Patient was resting on bed on rounds.  Her pain has been relatively well-controlled with available medications.   Objective: Vital signs in last 24 hours: Temp:  [97.7 F (36.5 C)-99.2 F (37.3 C)] 97.7 F (36.5 C) (06/17 0715) Pulse Rate:  [56-69] 57 (06/17 0715) Resp:  [16-20] 16 (06/17 0715) BP: (99-125)/(56-85) 120/85 (06/17 0715) SpO2:  [96 %-100 %] 100 % (06/17 0715) Weight:  [81 kg-81.2 kg] 81.2 kg (06/16 2310)  Intake/Output from previous day: 06/16 0701 - 06/17 0700 In: 493.3 [I.V.:493.3] Out: -   Intake/Output this shift: No intake/output data recorded.  Physical Exam:  General: Alert and oriented CV: No cyanosis Lungs: equal chest rise Abdomen: Soft, NTND, no rebound or guarding   Lab Results: Recent Labs    10/14/22 1645 10/15/22 1940 10/16/22 0437  HGB 9.6* 9.4* 8.2*  HCT 32.8* 31.8* 28.4*   BMET Recent Labs    10/15/22 1940 10/16/22 0437  NA 134* 136  K 3.2* 3.5  CL 113* 118*  CO2 15* 13*  GLUCOSE 131* 107*  BUN 12 11  CREATININE 1.01* 0.93  CALCIUM 8.2* 7.9*     Studies/Results: CT Renal Stone Study  Result Date: 10/14/2022 CLINICAL DATA:  Left-sided flank pain, initial encounter EXAM: CT ABDOMEN AND PELVIS WITHOUT CONTRAST TECHNIQUE: Multidetector CT imaging of the abdomen and pelvis was performed following the standard protocol without IV contrast. RADIATION DOSE REDUCTION: This exam was performed according to the departmental dose-optimization program which includes automated exposure control, adjustment of the mA and/or kV according to patient size and/or use of iterative reconstruction technique. COMPARISON:  06/03/2021 FINDINGS: Lower chest: No acute abnormality. Hepatobiliary: No focal liver abnormality is seen. No gallstones, gallbladder wall thickening, or biliary dilatation. Pancreas: Unremarkable. No pancreatic ductal dilatation or surrounding inflammatory changes. Spleen: Normal in  size without focal abnormality. Adrenals/Urinary Tract: Adrenal glands are within normal limits. Kidneys again demonstrate multiple renal calculi. The largest of these on the left measures 8 mm in the lower pole. The largest of these on the right measures 6 mm in the lower pole. Fullness of the renal pelvis and right ureter is seen. This extends inferiorly to the distal right ureter just above the ureterovesical junction. Multiple ureteral stones are identified in the distal right ureter causing the obstructive change. The bladder is decompressed. Stomach/Bowel: No obstructive or inflammatory changes of colon are noted. The appendix is within normal limits. Small bowel and stomach are unremarkable. Vascular/Lymphatic: No significant vascular findings are present. No enlarged abdominal or pelvic lymph nodes. Reproductive: Uterus and bilateral adnexa are unremarkable. IUD is noted in place. Other: No abdominal wall hernia or abnormality. No abdominopelvic ascites. Musculoskeletal: No acute or significant osseous findings. IMPRESSION: Considerable right hydronephrosis and hydroureter secondary to multiple distal right ureteral stones. The largest of these appears to measure 7 mm. Multiple bilateral renal calculi as described above. No left-sided hydronephrosis is seen. Electronically Signed   By: Alcide Clever M.D.   On: 10/14/2022 19:14    Assessment/Plan: # Urolithiasis Right distal ureteral stone with associated hydronephrosis. As needed antiemetics for N/V Right percutaneous nephrostomy tube in IR today. Continue Flomax 0.4 Strain all urine Preserved renal function Urinalysis is heavily contaminated, but not suggestive of UTI.  Nitrite negative, rare bacteria, 21-50 squams   LOS: 0 days   Elmon Kirschner, NP [facility] Pager: [phone]  10/16/2022, 9:46 AM

## 2022-10-16 NOTE — TOC CM/SW Note (Signed)
Transition of Care [facility]) - Inpatient Brief Assessment   Patient Details  Name: Phyllis House MRN: [id number] Date of Birth: Sep 02, 1975  Transition [facility] CM/SW Contact:    Otelia Santee, LCSW Phone Number: 10/16/2022, 1:57 PM   Clinical Narrative: Chart reviewed. No TOC needs identified.    Transition of Care Asessment: Insurance and Status: Insurance coverage has been reviewed Patient has primary care physician: Yes Home environment has been reviewed: Home alone Prior level of function:: Independent Prior/Current Home Services: No current home services Social Determinants of Health Reivew: SDOH reviewed no interventions necessary Readmission risk has been reviewed: Yes Transition of care needs: no transition of care needs at this time

## 2022-10-16 NOTE — Progress Notes (Signed)
PROGRESS NOTE    Phyllis House  UXL:[id number] DOB: Dec 23, 1975 DOA: 10/15/2022 PCP: Ollen Bowl, MD    Brief Narrative:  Phyllis House is a 47 y.o. 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  She was actually here yesterday for something similar.  She presents with left Right pelvic area pain - Actually yesterday had a right lower pelvis pain, nausea, dysuria, couple blood work abnormalities and UA was with moderate hemoglobin.  She had a CT renal stone study which showed multiple obstructing stones on the right with hydronephrosis largest stone 7 mm.  It was felt she could follow-up outpatient and she was discharged with pain, nausea and instructed to take Flomax.  She returns after having N/v and difficulty with pain control with similar RLQ/pelvic pain/flank pain.   Pt states that she passes a lot of kidney stones, even 2 last week.  She is not the best at avoiding calcium oxalate foods.  She states this time was different, sudden and quite severe.  Ibuprofen typically helps at home.  She doesn't take opioids daily.  Denies any other signs of UTI.  Doesn't think she is pregnant because she has IUD. Denies new sexual partner or new vaginal discharge.   Per review on [facility], she has trialed multiple medicines "including indapamide, hydrochlorothiazide, and potassium citrate. Most recently discontinued potassium citrate as she could not tolerate it"   Assessment and Plan: Hydronephrosis --multiple distal right ureteral stones. The largest of these appears to measure 7 mm." -- urology consulted -- s/p IR placement of nephrostomy tube-- may be able to take to the OR in the AM --holding on abx  -pain control   Anemia, baseline around 7-8 -outpatient follow up  HA's, has been working with outpatient headache specialist, will continue zonisamiade  Acidosis, bicarb 16, think  related to RTA -recheck BMP  Hypokalemia - replace as able  Obesity Estimated body mass index is 32.74 kg/m as calculated from the following:   Height as of this encounter: 5\' 2"  (1.575 m).   Weight as of this encounter: 81.2 kg.   DVT prophylaxis: heparin injection 5,000 Units Start: 10/16/22 2200 Place TED hose Start: 10/15/22 2204    Code Status: Full Code   Disposition Plan:  Level of care: Med-Surg Status is: Observation     Consultants:  urology   Subjective: Wanting to go home  Objective: Vitals:   10/16/22 1140 10/16/22 1145 10/16/22 1150 10/16/22 1215  BP: 111/68 111/66 105/62 121/60  Pulse: (!) 59 70 68 62  Resp: 13 16 15 18   Temp:    97.8 F (36.6 C)  TempSrc:    Oral  SpO2: 100% 100% 99% 99%  Weight:      Height:        Intake/Output Summary (Last 24 hours) at 10/16/2022 1301 Last data filed at 10/16/2022 1212 Gross per 24 hour  Intake 493.26 ml  Output 225 ml  Net 268.26 ml   Filed Weights   10/15/22 1849 10/15/22 2310  Weight: 81 kg 81.2 kg    Examination:   General: Appearance:    Obese female in no acute distress     Lungs:     Clear to auscultation bilaterally, respirations unlabored  Heart:    Normal heart rate. Normal rhythm. No murmurs, rubs, or gallops.    MS:   All extremities are intact.    Neurologic:   Awake,  alert, oriented x 3. No apparent focal neurological           defect.        Data Reviewed: I have personally reviewed following labs and imaging studies  CBC: Recent Labs  Lab 10/14/22 1645 10/15/22 1940 10/16/22 0437  WBC 4.6 6.7 3.2*  NEUTROABS 3.1  --   --   HGB 9.6* 9.4* 8.2*  HCT 32.8* 31.8* 28.4*  MCV 69.5* 69.7* 70.0*  PLT 175 159 133*   Basic Metabolic Panel: Recent Labs  Lab 10/14/22 1645 10/15/22 1940 10/16/22 0437  NA 135 134* 136  K 3.4* 3.2* 3.5  CL 115* 113* 118*  CO2 16* 15* 13*  GLUCOSE 92 131* 107*  BUN 11 12 11   CREATININE 0.97 1.01* 0.93  CALCIUM 8.1* 8.2* 7.9*    GFR: Estimated Creatinine Clearance: 73.8 mL/min (by C-G formula based on SCr of 0.93 mg/dL). Liver Function Tests: Recent Labs  Lab 10/14/22 1645 10/16/22 0437  AST 14* 11*  ALT 12 11  ALKPHOS 50 45  BILITOT 0.5 0.4  PROT 6.4* 5.4*  ALBUMIN 3.4* 3.1*   Recent Labs  Lab 10/14/22 1645  LIPASE 37   No results for input(s): "AMMONIA" in the last 168 hours. Coagulation Profile: Recent Labs  Lab 10/15/22 2349  INR 1.1   Cardiac Enzymes: No results for input(s): "CKTOTAL", "CKMB", "CKMBINDEX", "TROPONINI" in the last 168 hours. BNP (last 3 results) No results for input(s): "PROBNP" in the last 8760 hours. HbA1C: No results for input(s): "HGBA1C" in the last 72 hours. CBG: No results for input(s): "GLUCAP" in the last 168 hours. Lipid Profile: No results for input(s): "CHOL", "HDL", "LDLCALC", "TRIG", "CHOLHDL", "LDLDIRECT" in the last 72 hours. Thyroid Function Tests: No results for input(s): "TSH", "T4TOTAL", "FREET4", "T3FREE", "THYROIDAB" in the last 72 hours. Anemia Panel: No results for input(s): "VITAMINB12", "FOLATE", "FERRITIN", "TIBC", "IRON", "RETICCTPCT" in the last 72 hours. Sepsis Labs: No results for input(s): "PROCALCITON", "LATICACIDVEN" in the last 168 hours.  No results found for this or any previous visit (from the past 240 hour(s)).       Radiology Studies: IR NEPHROSTOMY PLACEMENT RIGHT  Result Date: 10/16/2022 INDICATION: Right-sided urinary obstruction secondary to distal right ureteral stones. Request made for image guided placement of right-sided nephrostomy catheter. EXAM: ULTRASOUND AND FLUOROSCOPIC GUIDED PLACEMENT OF RIGHT NEPHROSTOMY TUBE COMPARISON:  CT abdomen and pelvis-10/14/2022 MEDICATIONS: Rocephin 2 g IV; The antibiotic was administered in an appropriate time frame prior to skin puncture. ANESTHESIA/SEDATION: Moderate (conscious) sedation was employed during this procedure. A total of Versed 2 mg and Fentanyl 100 mcg was  administered intravenously. Moderate Sedation Time: 16 minutes. The patient's level of consciousness and vital signs were monitored continuously by radiology nursing throughout the procedure under my direct supervision. CONTRAST:  10 cc Omnipaque 300-administered into the renal collecting system FLUOROSCOPY TIME:  1 minute, 30 seconds (12 mGy) COMPLICATIONS: None immediate. PROCEDURE: The procedure, risks, benefits, and alternatives were explained to the patient, questions were encouraged and answered and informed consent was obtained. A timeout was performed prior to the initiation of the procedure. The operative site was prepped and draped in the usual sterile fashion and a sterile drape was applied covering the operative field. A sterile gown and sterile gloves were used for the procedure. Local anesthesia was provided with 1% Lidocaine with epinephrine. Ultrasound was used to localize the right kidney. Under direct ultrasound guidance, a 20 gauge needle was advanced into the renal collecting system. An ultrasound image  documentation was performed. Access within the collecting system was confirmed with the efflux of urine followed by limited contrast injection. Under intermittent fluoroscopic guidance, an 0.018 wire was advanced into the collecting system and the tract was dilated with an Accustick stent. Next, over a short Amplatz wire, the track was further dilated ultimately allowing placement of a 10-French percutaneous nephrostomy catheter with end coiled and locked within the renal pelvis. Contrast was injected and several spot fluoroscopic images were obtained in various obliquities. The catheter was secured at the skin entrance site with an interrupted suture and a stat lock device and connected to a gravity bag. Dressings were applied. The patient tolerated procedure well without immediate postprocedural complication. FINDINGS: Ultrasound scanning demonstrates a moderate to severely dilated right  collecting system. Under a combination of ultrasound and fluoroscopic guidance, a posterior inferior calix was targeted allowing placement of a 10-French percutaneous nephrostomy catheter with end coiled and locked within the renal pelvis. Contrast injection confirmed appropriate positioning. Note, patient's known extensive right-sided nephrolithiasis is not well demonstrated fluoroscopically. IMPRESSION: Successful ultrasound and fluoroscopic guided placement of a right sided 10 French PCN. Electronically Signed   By: Simonne Come M.D.   On: 10/16/2022 12:04   CT Renal Stone Study  Result Date: 10/14/2022 CLINICAL DATA:  Left-sided flank pain, initial encounter EXAM: CT ABDOMEN AND PELVIS WITHOUT CONTRAST TECHNIQUE: Multidetector CT imaging of the abdomen and pelvis was performed following the standard protocol without IV contrast. RADIATION DOSE REDUCTION: This exam was performed according to the departmental dose-optimization program which includes automated exposure control, adjustment of the mA and/or kV according to patient size and/or use of iterative reconstruction technique. COMPARISON:  06/03/2021 FINDINGS: Lower chest: No acute abnormality. Hepatobiliary: No focal liver abnormality is seen. No gallstones, gallbladder wall thickening, or biliary dilatation. Pancreas: Unremarkable. No pancreatic ductal dilatation or surrounding inflammatory changes. Spleen: Normal in size without focal abnormality. Adrenals/Urinary Tract: Adrenal glands are within normal limits. Kidneys again demonstrate multiple renal calculi. The largest of these on the left measures 8 mm in the lower pole. The largest of these on the right measures 6 mm in the lower pole. Fullness of the renal pelvis and right ureter is seen. This extends inferiorly to the distal right ureter just above the ureterovesical junction. Multiple ureteral stones are identified in the distal right ureter causing the obstructive change. The bladder is  decompressed. Stomach/Bowel: No obstructive or inflammatory changes of colon are noted. The appendix is within normal limits. Small bowel and stomach are unremarkable. Vascular/Lymphatic: No significant vascular findings are present. No enlarged abdominal or pelvic lymph nodes. Reproductive: Uterus and bilateral adnexa are unremarkable. IUD is noted in place. Other: No abdominal wall hernia or abnormality. No abdominopelvic ascites. Musculoskeletal: No acute or significant osseous findings. IMPRESSION: Considerable right hydronephrosis and hydroureter secondary to multiple distal right ureteral stones. The largest of these appears to measure 7 mm. Multiple bilateral renal calculi as described above. No left-sided hydronephrosis is seen. Electronically Signed   By: Alcide Clever M.D.   On: 10/14/2022 19:14        Scheduled Meds:  sodium chloride   Intravenous Once   heparin  5,000 Units Subcutaneous Q8H   sodium chloride flush  3 mL Intravenous Q12H   sodium chloride flush  5 mL Intracatheter Q8H   tamsulosin  0.4 mg Oral Daily   zonisamide  100 mg Oral QHS   Continuous Infusions:  lactated ringers 75 mL/hr at 10/16/22 0932     LOS:  0 days    Time spent: 45 minutes spent on chart review, discussion with nursing staff, consultants, updating family and interview/physical exam; more than 50% of that time was spent in counseling and/or coordination of care.    Joseph Art, DO [facility] Available via Epic secure chat 7am-7pm After these hours, please refer to coverage provider listed on [email or web address] 10/16/2022, 1:01 PM

## 2022-10-16 NOTE — Consult Note (Signed)
Chief Complaint: Patient was seen in consultation today for right percutaneous nephrostomy Chief Complaint  Patient presents with   Flank Pain    Referring Physician(s): Herrick,B  Supervising Physician: Simonne Come  Patient Status: [facility] - In-pt  History of Present Illness: Phyllis House is a 47 y.o. female with PMH sig for anxiety/depression, eczema, HSV, PCOS, anemia and nephrolithiasis.  She was recently admitted to [facility] on 6/16 with nausea, vomiting, right flank-pelvic pain.  Renal CT revealed considerable right hydronephrosis and hydroureter secondary to multiple distal right ureteral stones largest measuring 7 mm.  There were multiple bilateral renal calculi but no left-sided hydronephrosis.  Patient was evaluated by urology and request now received for right percutaneous nephrostomy.  She is afebrile, WBC 3.2, hemoglobin 8.2, platelets 133k, creatinine normal, PT/INR normal.  Past Medical History:  Diagnosis Date   Anxiety    Depression    Eczema    H/O varicella    Hematuria    History of herpes genitalis    History of kidney stones    pt has long hx calcium phosphate stones   Hypocalcemia    Pneumonia    once 10+ yeaers ago   Polycystic ovarian syndrome    PONV (postoperative nausea and vomiting)    Renal tubular acidosis type I    Right ureteral stone    Seasonal allergies    Urgency of urination     Past Surgical History:  Procedure Laterality Date   CYSTOSCOPY W/ RETROGRADES Right 12/12/2019   Procedure: CYSTOSCOPY WITH RIGHT RETROGRADE PYELOGRAM AND STENT PLACEMENT;  Surgeon: Heloise Purpura, MD;  Location: [facility];  Service: Urology;  Laterality: Right;   CYSTOSCOPY W/ URETERAL STENT PLACEMENT Left 07/01/2012   Procedure: CYSTOSCOPY WITH RETROGRADE PYELOGRAM/URETERAL STENT PLACEMENT;  Surgeon: Milford Cage, MD;  Location: [facility];  Service: Urology;  Laterality: Left;   CYSTOSCOPY W/ URETERAL STENT PLACEMENT Right 07/27/2016    Procedure: CYSTOSCOPY WITH RETROGRADE PYELOGRAM/URETERAL STENT PLACEMENT;  Surgeon: Alfredo Martinez, MD;  Location: [facility];  Service: Urology;  Laterality: Right;   CYSTOSCOPY W/ URETERAL STENT PLACEMENT Right 05/08/2019   Procedure: CYSTOSCOPY WITH RETROGRADE PYELOGRAM/URETERAL STENT PLACEMENT;  Surgeon: Malen Gauze, MD;  Location: [facility];  Service: Urology;  Laterality: Right;   CYSTOSCOPY W/ URETERAL STENT PLACEMENT Right 03/05/2021   Procedure: CYSTOURETEROSCOPY WITH RETROGRADE PYELOGRAM/URETERAL STENT PLACEMENT RIGHT;  Surgeon: Malen Gauze, MD;  Location: [facility];  Service: Urology;  Laterality: Right;   CYSTOSCOPY WITH RETROGRADE PYELOGRAM, URETEROSCOPY AND STENT PLACEMENT Left 07/15/2012   Procedure: CYSTOSCOPY WITH left RETROGRADE PYELOGRAM,left  URETEROSCOPY AND left STENT exchange ;  Surgeon: Milford Cage, MD;  Location: [facility];  Service: Urology;  Laterality: Left;  LEFT URETER STENT EXCHANGE     CYSTOSCOPY WITH RETROGRADE PYELOGRAM, URETEROSCOPY AND STENT PLACEMENT Right 08/11/2016   Procedure: CYSTOSCOPY WITH RIGHT RETROGRADE PYELOGRAM,RIGHT  URETEROSCOPY AND STENT CHANGE;  Surgeon: Ihor Gully, MD;  Location: [facility];  Service: Urology;  Laterality: Right;   CYSTOSCOPY WITH URETEROSCOPY AND STENT PLACEMENT Right 07/12/2021   Procedure: CYSTOSCOPY WITH RIGHT URETEROSCOPY AND STENT PLACEMENT;  Surgeon: [name], MD;  Location: [facility];  Service: Urology;  Laterality: Right;   CYSTOSCOPY/RETROGRADE/URETEROSCOPY/STONE EXTRACTION WITH BASKET  AGE 69   CYSTOSCOPY/URETEROSCOPY/HOLMIUM LASER/STENT PLACEMENT Bilateral 09/19/2018   Procedure: CYSTOSCOPY/RETROGRADE/URETEROSCOPY/HOLMIUM LASER/STENT PLACEMENT;  Surgeon: [name], MD;  Location: [facility];  Service: Urology;  Laterality: Bilateral;   CYSTOSCOPY/URETEROSCOPY/HOLMIUM LASER/STENT PLACEMENT Right 10/22/2018   Procedure:  CYSTOSCOPY/RETROGRADE/URETEROSCOPY/HOLMIUM LASER/STENT PLACEMENT;  Surgeon: [name], MD;  Location: [facility];  Service: Urology;  Laterality: Right;  ONLY NEEDS 60 MIN   CYSTOSCOPY/URETEROSCOPY/HOLMIUM LASER/STENT PLACEMENT Right 02/11/2019   Procedure: CYSTOSCOPY/RETROGRADE/URETEROSCOPY/HOLMIUM LASER/STENT PLACEMENT;  Surgeon: [name], MD;  Location: [facility];  Service: Urology;  Laterality: Right;   CYSTOSCOPY/URETEROSCOPY/HOLMIUM LASER/STENT PLACEMENT Bilateral 06/03/2019   Procedure: CYSTOSCOPY/RETROGRADE/URETEROSCOPY/HOLMIUM LASER/STENT PLACEMENT;  Surgeon: [name], MD;  Location: [facility];  Service: Urology;  Laterality: Bilateral;   CYSTOSCOPY/URETEROSCOPY/HOLMIUM LASER/STENT PLACEMENT Right 12/30/2019   Procedure: CYSTOSCOPY, RETROGRADE /URETEROSCOPY/HOLMIUM LASER/STENT EXCHANGE;  Surgeon: [name], MD;  Location: [facility];  Service: Urology;  Laterality: Right;   EXTRACORPOREAL SHOCK WAVE LITHOTRIPSY Left 08/07/2019   Procedure: EXTRACORPOREAL SHOCK WAVE LITHOTRIPSY (ESWL);  Surgeon: Noel Christmas, MD;  Location: [facility];  Service: Urology;  Laterality: Left;   EXTRACORPOREAL SHOCK WAVE LITHOTRIPSY Left 04/08/2020   Procedure: EXTRACORPOREAL SHOCK WAVE LITHOTRIPSY (ESWL);  Surgeon: Belva Agee, MD;  Location: [facility];  Service: Urology;  Laterality: Left;   EXTRACORPOREAL SHOCK WAVE LITHOTRIPSY Right 12/16/2020   Procedure: EXTRACORPOREAL SHOCK WAVE LITHOTRIPSY (ESWL);  Surgeon: Alfredo Martinez, MD;  Location: [facility];  Service: Urology;  Laterality: Right;   HOLMIUM LASER APPLICATION Left 07/15/2012   Procedure: HOLMIUM LASER APPLICATION;  Surgeon: Milford Cage, MD;  Location: [facility];  Service: Urology;  Laterality: Left;   HOLMIUM LASER APPLICATION Right 08/11/2016   Procedure:  HOLMIUM LASER APPLICATION;  Surgeon: Ihor Gully, MD;  Location: [facility];  Service: Urology;  Laterality: Right;   HOLMIUM LASER APPLICATION Right 02/11/2019   Procedure: HOLMIUM LASER APPLICATION;  Surgeon: [name], MD;  Location: [facility];  Service: Urology;  Laterality: Right;   TONSILLECTOMY  AS CHILD   URETEROSCOPY      Allergies: Adhesive [tape]  Medications: Prior to Admission medications   Medication Sig Start Date End Date Taking? Authorizing Provider  levonorgestrel (MIRENA) 20 MCG/24HR IUD 1 each by Intrauterine route once. Implanted November 2019   Yes [provider]  ondansetron (ZOFRAN) 4 MG tablet Take 1 tablet (4 mg total) by mouth every 6 (six) hours. 10/14/22  Yes Prosperi, Christian H, PA-C  oxyCODONE-acetaminophen (PERCOCET/ROXICET) 5-325 MG tablet Take 1 tablet by mouth every 6 (six) hours as needed for severe pain. 10/14/22  Yes Prosperi, Christian H, PA-C  tamsulosin (FLOMAX) 0.4 MG CAPS capsule Take 0.4 mg by mouth daily.   Yes [provider]  zonisamide (ZONEGRAN) 100 MG capsule Take 100 mg by mouth at bedtime. 10/05/22  Yes [provider]  ferrous sulfate 325 (65 FE) MG tablet Take 1 tablet (325 mg total) by mouth daily. Patient not taking: Reported on 01/09/2022 11/01/21   Long, Arlyss Repress, MD  HYDROcodone-acetaminophen (NORCO/VICODIN) 5-325 MG tablet Take 1 tablet by mouth every 6 (six) hours as needed. Patient not taking: Reported on 01/09/2022 07/12/21   [name], MD  ondansetron (ZOFRAN-ODT) 4 MG disintegrating tablet Take 1 tablet (4 mg total) by mouth every 8 (eight) hours as needed for nausea or vomiting. Patient not taking: Reported on 01/09/2022 11/01/21   Long, Arlyss Repress, MD  potassium chloride SA (KLOR-CON M) 20 MEQ tablet Take 2 tablets (40 mEq total) by mouth daily for 5 days. Patient not taking: Reported on 10/15/2022 11/01/21 11/06/21  Long, Arlyss Repress, MD  senna-docusate (SENOKOT-S)  8.6-50 MG tablet Take 1 tablet by mouth daily. Patient  not taking: Reported on 01/09/2022 11/01/21   Long, Arlyss Repress, MD     Family History  Problem Relation Age of Onset   Thyroid disease Mother    Depression Mother    Cancer Father        leaukemia   Thyroid disease Brother    Cancer Maternal Grandmother        GI   Diabetes Maternal Grandfather    Heart disease Paternal Grandmother    Diabetes Maternal Aunt     Social History   Socioeconomic History   Marital status: Single    Spouse name: Not on file   Number of children: Not on file   Years of education: Not on file   Highest education level: Not on file  Occupational History   Not on file  Tobacco Use   Smoking status: Never   Smokeless tobacco: Never  Vaping Use   Vaping Use: Never used  Substance and Sexual Activity   Alcohol use: Yes    Comment: occasional   Drug use: No   Sexual activity: Yes    Birth control/protection: I.U.D.    Comment: Mirena placed 11/ 2019  Other Topics Concern   Not on file  Social History Narrative   Not on file   Social Determinants of Health   Financial Resource Strain: Not on file  Food Insecurity: No Food Insecurity (10/15/2022)   Hunger Vital Sign    Worried About Running Out of Food in the Last Year: Never true    Ran Out of Food in the Last Year: Never true  Transportation Needs: No Transportation Needs (10/15/2022)   PRAPARE - Administrator, Civil Service (Medical): No    Lack of Transportation (Non-Medical): No  Physical Activity: Not on file  Stress: Not on file  Social Connections: Not on file      Review of Systems currently denies fever, headache, chest pain, dyspnea, cough,, hematuria, dysuria.  She has had some recent nausea vomiting right flank/pelvic pain  Vital Signs: BP 120/85 (BP Location: Left Arm)   Pulse (!) 57   Temp 97.7 F (36.5 C) (Oral)   Resp 16   Ht 5\' 2"  (1.575 m)   Wt 179 lb 0.2 oz (81.2 kg)   SpO2 100%   BMI 32.74 kg/m      Physical Exam: Awake, alert.  Chest clear to auscultation bilaterally.  Heart with regular rate and rhythm.  Abdomen soft, positive bowel sounds, mildly tender suprapubic region to palpation; no significant lower extremity edema.  Imaging: CT Renal Stone Study  Result Date: 10/14/2022 CLINICAL DATA:  Left-sided flank pain, initial encounter EXAM: CT ABDOMEN AND PELVIS WITHOUT CONTRAST TECHNIQUE: Multidetector CT imaging of the abdomen and pelvis was performed following the standard protocol without IV contrast. RADIATION DOSE REDUCTION: This exam was performed according to the departmental dose-optimization program which includes automated exposure control, adjustment of the mA and/or kV according to patient size and/or use of iterative reconstruction technique. COMPARISON:  06/03/2021 FINDINGS: Lower chest: No acute abnormality. Hepatobiliary: No focal liver abnormality is seen. No gallstones, gallbladder wall thickening, or biliary dilatation. Pancreas: Unremarkable. No pancreatic ductal dilatation or surrounding inflammatory changes. Spleen: Normal in size without focal abnormality. Adrenals/Urinary Tract: Adrenal glands are within normal limits. Kidneys again demonstrate multiple renal calculi. The largest of these on the left measures 8 mm in the lower pole. The largest of these on the right measures 6 mm in the lower pole. Fullness of the renal  pelvis and right ureter is seen. This extends inferiorly to the distal right ureter just above the ureterovesical junction. Multiple ureteral stones are identified in the distal right ureter causing the obstructive change. The bladder is decompressed. Stomach/Bowel: No obstructive or inflammatory changes of colon are noted. The appendix is within normal limits. Small bowel and stomach are unremarkable. Vascular/Lymphatic: No significant vascular findings are present. No enlarged abdominal or pelvic lymph nodes. Reproductive: Uterus and bilateral adnexa are  unremarkable. IUD is noted in place. Other: No abdominal wall hernia or abnormality. No abdominopelvic ascites. Musculoskeletal: No acute or significant osseous findings. IMPRESSION: Considerable right hydronephrosis and hydroureter secondary to multiple distal right ureteral stones. The largest of these appears to measure 7 mm. Multiple bilateral renal calculi as described above. No left-sided hydronephrosis is seen. Electronically Signed   By: Alcide Clever M.D.   On: 10/14/2022 19:14    Labs:  CBC: Recent Labs    01/09/22 1719 10/14/22 1645 10/15/22 1940 10/16/22 0437  WBC 3.9* 4.6 6.7 3.2*  HGB 8.1* 9.6* 9.4* 8.2*  HCT 28.3* 32.8* 31.8* 28.4*  PLT 196 175 159 133*    COAGS: Recent Labs    11/16/21 1209 10/15/22 2349  INR 1.1 1.1    BMP: Recent Labs    01/09/22 1719 10/14/22 1645 10/15/22 1940 10/16/22 0437  NA 139 135 134* 136  K 2.9* 3.4* 3.2* 3.5  CL 118* 115* 113* 118*  CO2 17* 16* 15* 13*  GLUCOSE 98 92 131* 107*  BUN 12 11 12 11   CALCIUM 9.0 8.1* 8.2* 7.9*  CREATININE 0.90 0.97 1.01* 0.93  GFRNONAA >60 >60 >60 >60    LIVER FUNCTION TESTS: Recent Labs    11/01/21 1924 01/09/22 1719 10/14/22 1645 10/16/22 0437  BILITOT 0.5 0.7 0.5 0.4  AST 18 18 14* 11*  ALT 15 16 12 11   ALKPHOS 49 57 50 45  PROT 7.2 7.6 6.4* 5.4*  ALBUMIN 4.2 4.3 3.4* 3.1*    TUMOR MARKERS: No results for input(s): "AFPTM", "CEA", "CA199", "CHROMGRNA" in the last 8760 hours.  Assessment and Plan: 47 y.o. female with PMH sig for anxiety/depression, eczema, HSV, PCOS, anemia and nephrolithiasis.  She was recently admitted to [facility] on 6/16 with nausea, vomiting, right flank-pelvic pain.  Renal CT revealed considerable right hydronephrosis and hydroureter secondary to multiple distal right ureteral stones largest measuring 7 mm.  There were multiple bilateral renal calculi but no left-sided hydronephrosis.  Patient was evaluated by urology and request now received for  right percutaneous nephrostomy.  She is afebrile, WBC 3.2, hemoglobin 8.2, platelets 133k, creatinine normal, PT/INR normal.  Imaging studies have been reviewed by Dr. Grace Isaac.Risks and benefits of right PCN placement was discussed with the patient including, but not limited to, infection, bleeding, significant bleeding causing loss or decrease in renal function or damage to adjacent structures.   All of the patient's questions were answered, patient is agreeable to proceed.  Consent signed and in chart.  Procedure scheduled for later today.      Thank you for this interesting consult.  I greatly enjoyed meeting Phyllis House and look forward to participating in their care.  A copy of this report was sent to the requesting provider on this date.  Electronically Signed: D. Jeananne Rama, PA-C 10/16/2022, 9:03 AM   I spent a total of 25 minutes    in face to face in clinical consultation, greater than 50% of which was counseling/coordinating care for right percutaneous nephrostomy

## 2022-10-16 NOTE — Progress Notes (Signed)
MEDICATION-RELATED CONSULT NOTE   IR Procedure Consult - Anticoagulant/Antiplatelet PTA/Inpatient Med List Review by Pharmacist    Procedure: right sided PCN    Completed: 6/17 ~1200  Post-Procedural bleeding risk per IR MD assessment:  HIGH  Antithrombotic medications on inpatient or PTA profile prior to procedure:   SQ heparin 5000 units q8    Recommended restart time per IR Post-Procedure Guidelines:  Day 0 (at least 8 hours or at next standard dose interval   Other considerations:      Plan:    Restart SQ heparin at 2200 tonight   Hessie Knows, PharmD, BCPS Secure Chat if ?s 10/16/2022 12:05 PM

## 2022-10-16 NOTE — Procedures (Signed)
Pre Procedure Dx: Hydronephrosis d/t distal right ureteral stones. Post Procedural Dx: Same  Successful [place] and fluoroscopic guided placement of a right sided PCN with end coiled and locked within the renal pelvis. PCN connected to gravity bag.  EBL: Trace Complications: None immediate.  Katherina Right, MD Pager #: [phone]

## 2022-10-17 ENCOUNTER — Encounter (HOSPITAL_COMMUNITY)
Admission: EM | Disposition: A | Discharge status: Home / Self Care | Payer: Self-pay | Source: Home / Self Care | Attending: Emergency Medicine

## 2022-10-17 ENCOUNTER — Observation Stay (HOSPITAL_COMMUNITY): Payer: BC Managed Care – PPO | Admitting: Anesthesiology

## 2022-10-17 ENCOUNTER — Encounter (HOSPITAL_COMMUNITY): Payer: Self-pay | Admitting: Internal Medicine

## 2022-10-17 ENCOUNTER — Observation Stay (HOSPITAL_COMMUNITY): Payer: BC Managed Care – PPO

## 2022-10-17 DIAGNOSIS — N1330 Unspecified hydronephrosis: Secondary | ICD-10-CM

## 2022-10-17 HISTORY — PX: CYSTOSCOPY/URETEROSCOPY/HOLMIUM LASER/STENT PLACEMENT: SHX6546

## 2022-10-17 LAB — BASIC METABOLIC PANEL
Anion gap: 7 (ref 5–15)
BUN: 10 mg/dL (ref 6–20)
CO2: 15 mmol/L — ABNORMAL LOW (ref 22–32)
Calcium: 8.2 mg/dL — ABNORMAL LOW (ref 8.9–10.3)
Chloride: 117 mmol/L — ABNORMAL HIGH (ref 98–111)
Creatinine, Ser: 1 mg/dL (ref 0.44–1.00)
GFR, Estimated: >60 mL/min (ref >60)
Glucose, Bld: 84 mg/dL (ref 70–99)
Potassium: 3.3 mmol/L — ABNORMAL LOW (ref 3.5–5.1)
Sodium: 139 mmol/L (ref 135–145)

## 2022-10-17 LAB — POCT I-STAT, CHEM 8
BUN: 8 mg/dL (ref 6–20)
Calcium, Ion: 1.22 mmol/L (ref 1.15–1.40)
Chloride: 114 mmol/L — ABNORMAL HIGH (ref 98–111)
Creatinine, Ser: 1 mg/dL (ref 0.44–1.00)
Glucose, Bld: 86 mg/dL (ref 70–99)
HCT: 27 % — ABNORMAL LOW (ref 36.0–46.0)
Hemoglobin: 9.2 g/dL — ABNORMAL LOW (ref 12.0–15.0)
Potassium: 3.6 mmol/L (ref 3.5–5.1)
Sodium: 141 mmol/L (ref 135–145)
TCO2: 16 mmol/L — ABNORMAL LOW (ref 22–32)

## 2022-10-17 SURGERY — CYSTOSCOPY/URETEROSCOPY/HOLMIUM LASER/STENT PLACEMENT
Anesthesia: General | Site: Ureter | Laterality: Right

## 2022-10-17 MED ORDER — LACTATED RINGERS IV SOLN: INTRAVENOUS | Status: DC | PRN

## 2022-10-17 MED ORDER — LIDOCAINE 2% (20 MG/ML) 5 ML SYRINGE
INTRAMUSCULAR | Status: DC | PRN
  Administered 2022-10-17: 60 mg via INTRAVENOUS

## 2022-10-17 MED ORDER — LIDOCAINE HCL (PF) 2 % IJ SOLN
INTRAMUSCULAR | Status: AC
  Filled 2022-10-17: qty 5

## 2022-10-17 MED ORDER — MIDAZOLAM HCL 2 MG/2ML IJ SOLN
INTRAMUSCULAR | Status: AC
  Filled 2022-10-17: qty 2

## 2022-10-17 MED ORDER — 0.9 % SODIUM CHLORIDE (POUR BTL) OPTIME
TOPICAL | Status: DC | PRN
  Administered 2022-10-17: 1000 mL

## 2022-10-17 MED ORDER — PROPOFOL 10 MG/ML IV BOLUS
INTRAVENOUS | Status: DC | PRN
  Administered 2022-10-17: 30 mg via INTRAVENOUS
  Administered 2022-10-17: 200 mg via INTRAVENOUS

## 2022-10-17 MED ORDER — ACETAMINOPHEN 500 MG PO TABS: 1000.0000 mg | ORAL_TABLET | Freq: Three times a day (TID) | ORAL | 0 refills | Status: AC | PRN

## 2022-10-17 MED ORDER — PROPOFOL 10 MG/ML IV BOLUS
INTRAVENOUS | Status: AC
  Filled 2022-10-17: qty 20

## 2022-10-17 MED ORDER — FENTANYL CITRATE PF 50 MCG/ML IJ SOSY: 25.0000 ug | PREFILLED_SYRINGE | INTRAMUSCULAR | Status: DC | PRN

## 2022-10-17 MED ORDER — OXYCODONE HCL 5 MG/5ML PO SOLN: 5.0000 mg | Freq: Once | ORAL | Status: DC | PRN

## 2022-10-17 MED ORDER — FENTANYL CITRATE (PF) 100 MCG/2ML IJ SOLN
INTRAMUSCULAR | Status: AC
  Filled 2022-10-17: qty 2

## 2022-10-17 MED ORDER — POTASSIUM CHLORIDE CRYS ER 20 MEQ PO TBCR
40.0000 meq | EXTENDED_RELEASE_TABLET | Freq: Every day | ORAL | Status: DC
  Administered 2022-10-17: 40 meq via ORAL
  Filled 2022-10-17: qty 2

## 2022-10-17 MED ORDER — PROMETHAZINE HCL 25 MG/ML IJ SOLN: 6.2500 mg | INTRAMUSCULAR | Status: DC | PRN

## 2022-10-17 MED ORDER — OXYCODONE HCL 5 MG PO TABS: 5.0000 mg | ORAL_TABLET | Freq: Once | ORAL | Status: DC | PRN

## 2022-10-17 MED ORDER — FENTANYL CITRATE (PF) 100 MCG/2ML IJ SOLN
INTRAMUSCULAR | Status: DC | PRN
  Administered 2022-10-17 (×2): 50 ug via INTRAVENOUS

## 2022-10-17 MED ORDER — ONDANSETRON HCL 4 MG/2ML IJ SOLN
INTRAMUSCULAR | Status: DC | PRN
  Administered 2022-10-17: 4 mg via INTRAVENOUS

## 2022-10-17 MED ORDER — SODIUM CHLORIDE 0.9 % IR SOLN
Status: DC | PRN
  Administered 2022-10-17: 3000 mL

## 2022-10-17 MED ORDER — OXYCODONE-ACETAMINOPHEN 5-325 MG PO TABS: 1.0000 | ORAL_TABLET | Freq: Four times a day (QID) | ORAL | 0 refills | Status: DC | PRN

## 2022-10-17 MED ORDER — FENTANYL CITRATE PF 50 MCG/ML IJ SOSY
PREFILLED_SYRINGE | INTRAMUSCULAR | Status: AC
  Filled 2022-10-17: qty 2

## 2022-10-17 MED ORDER — DEXAMETHASONE SODIUM PHOSPHATE 10 MG/ML IJ SOLN
INTRAMUSCULAR | Status: AC
  Filled 2022-10-17: qty 1

## 2022-10-17 MED ORDER — POTASSIUM CHLORIDE 10 MEQ/100ML IV SOLN
10.0000 meq | INTRAVENOUS | Status: DC
  Administered 2022-10-17: 10 meq via INTRAVENOUS
  Filled 2022-10-17 (×3): qty 100

## 2022-10-17 MED ORDER — MIDAZOLAM HCL 5 MG/5ML IJ SOLN
INTRAMUSCULAR | Status: DC | PRN
  Administered 2022-10-17: 2 mg via INTRAVENOUS

## 2022-10-17 MED ORDER — DEXAMETHASONE SODIUM PHOSPHATE 10 MG/ML IJ SOLN
INTRAMUSCULAR | Status: DC | PRN
  Administered 2022-10-17: 8 mg via INTRAVENOUS

## 2022-10-17 MED ORDER — PHENYLEPHRINE 80 MCG/ML (10ML) SYRINGE FOR IV PUSH (FOR BLOOD PRESSURE SUPPORT)
PREFILLED_SYRINGE | INTRAVENOUS | Status: AC
  Filled 2022-10-17: qty 10

## 2022-10-17 MED ORDER — IOHEXOL 300 MG/ML  SOLN
INTRAMUSCULAR | Status: DC | PRN
  Administered 2022-10-17: 6 mL

## 2022-10-17 SURGICAL SUPPLY — 22 items
BAG URO CATCHER STRL LF (MISCELLANEOUS) ×1 IMPLANT
BASKET ZERO TIP NITINOL 2.4FR (BASKET) IMPLANT
BSKT STON RTRVL ZERO TP 2.4FR (BASKET)
CATH URET 5FR 70CM CONE TIP (BALLOONS) IMPLANT
CATH URETL OPEN END 6FR 70 (CATHETERS) ×1 IMPLANT
CLOTH BEACON ORANGE TIMEOUT ST (SAFETY) ×2 IMPLANT
DRSG TEGADERM 2-3/8X2-3/4 SM (GAUZE/BANDAGES/DRESSINGS) IMPLANT
FIBER LASER MOSES 200 DFL (Laser) IMPLANT
FIBER LASER MOSES 365 DFL (Laser) IMPLANT
GLOVE BIO SURGEON STRL SZ7.5 (GLOVE) ×1 IMPLANT
GOWN STRL REUS W/ TWL XL LVL3 (GOWN DISPOSABLE) ×2 IMPLANT
GOWN STRL REUS W/TWL XL LVL3 (GOWN DISPOSABLE) ×1
GUIDEWIRE STR DUAL SENSOR (WIRE) ×2 IMPLANT
GUIDEWIRE ZIPWRE .038 STRAIGHT (WIRE) IMPLANT
KIT TURNOVER KIT A (KITS) IMPLANT
MANIFOLD NEPTUNE II (INSTRUMENTS) ×1 IMPLANT
PACK CYSTO (CUSTOM PROCEDURE TRAY) ×1 IMPLANT
SHEATH NAVIGATOR HD 11/13X28 (SHEATH) IMPLANT
SHEATH NAVIGATOR HD 11/13X36 (SHEATH) IMPLANT
STENT URET 6FRX24 CONTOUR (STENTS) IMPLANT
TUBING CONNECTING 10 (TUBING) ×1 IMPLANT
TUBING UROLOGY SET (TUBING) ×2 IMPLANT

## 2022-10-17 NOTE — Discharge Summary (Signed)
Physician Discharge Summary  Phyllis House ZOX:[id number] DOB: Jun 29, 1975 DOA: 10/15/2022  PCP: Phyllis Bowl, MD  Admit date: 10/15/2022 Discharge date: 10/17/2022  Admitted From: home Discharge disposition: home   Recommendations for Outpatient Follow-Up:   BMp at next office visit Outpatient urology follow up   Discharge Diagnosis:   Principal Problem:   Hydronephrosis Active Problems:   Hypokalemia   Anxiety   Obesity   Renal tubular acidosis type I    Discharge Condition: Improved.  Diet recommendation:  Regular.  Wound care: None.  Code status: Full.   History of Present Illness:   Phyllis House is a 47 y.o. 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  She was actually here yesterday for something similar.  She presents with left Right pelvic area pain - Actually yesterday had a right lower pelvis pain, nausea, dysuria, couple blood work abnormalities and UA was with moderate hemoglobin.  She had a CT renal stone study which showed multiple obstructing stones on the right with hydronephrosis largest stone 7 mm.  It was felt she could follow-up outpatient and she was discharged with pain, nausea and instructed to take Flomax.  She returns after having N/v and difficulty with pain control with similar RLQ/pelvic pain/flank pain.   Pt states that she passes a lot of kidney stones, even 2 last week.  She is not the best at avoiding calcium oxalate foods.  She states this time was different, sudden and quite severe.  Ibuprofen typically helps at home.  She doesn't take opioids daily.  Denies any other signs of UTI.  Doesn't think she is pregnant because she has IUD. Denies new sexual partner or new vaginal discharge.   Per review on [facility], she has trialed multiple medicines "including indapamide, hydrochlorothiazide, and potassium citrate. Most recently  discontinued potassium citrate as she could not tolerate it"     Hospital Course by Problem:   Hydronephrosis --multiple distal right ureteral stones. The largest of these appears to measure 7 mm." -- urology consulted -- s/p IR placement of nephrostomy tube--and then removal after stent placement --holding on abx  -pain control   Anemia, baseline around 7-8 -outpatient follow up   HA's, has been working with outpatient headache specialist, will continue zonisamiade   Acidosis, bicarb 16, think related to RTA -resolved   Hypokalemia - replace as able   Obesity Estimated body mass index is 32.74 kg/m as calculated from the following:   Height as of this encounter: 5\' 2"  (1.575 m).   Weight as of this encounter: 81.2 kg.     Medical Consultants:    urology  Discharge Exam:   Vitals:   10/17/22 1330 10/17/22 1405  BP: 105/72 116/65  Pulse: (!) 55 63  Resp: (!) 26 16  Temp:  (!) 97.5 F (36.4 C)  SpO2: 94% 99%   Vitals:   10/17/22 1300 10/17/22 1315 10/17/22 1330 10/17/22 1405  BP: 102/69 113/69 105/72 116/65  Pulse: (!) 59 (!) 58 (!) 55 63  Resp: 11 20 (!) 26 16  Temp:    (!) 97.5 F (36.4 C)  TempSrc:    Oral  SpO2: 100% 99% 94% 99%  Weight:      Height:        General exam: Appears calm and comfortable.    The results of significant diagnostics from this hospitalization (including imaging, microbiology, ancillary and  laboratory) are listed below for reference.     Procedures and Diagnostic Studies:   IR NEPHROSTOMY PLACEMENT RIGHT  Result Date: 10/16/2022 INDICATION: Right-sided urinary obstruction secondary to distal right ureteral stones. Request made for image guided placement of right-sided nephrostomy catheter. EXAM: ULTRASOUND AND FLUOROSCOPIC GUIDED PLACEMENT OF RIGHT NEPHROSTOMY TUBE COMPARISON:  CT abdomen and pelvis-10/14/2022 MEDICATIONS: Rocephin 2 g IV; The antibiotic was administered in an appropriate time frame prior to skin puncture.  ANESTHESIA/SEDATION: Moderate (conscious) sedation was employed during this procedure. A total of Versed 2 mg and Fentanyl 100 mcg was administered intravenously. Moderate Sedation Time: 16 minutes. The patient's level of consciousness and vital signs were monitored continuously by radiology nursing throughout the procedure under my direct supervision. CONTRAST:  10 cc Omnipaque 300-administered into the renal collecting system FLUOROSCOPY TIME:  1 minute, 30 seconds (12 mGy) COMPLICATIONS: None immediate. PROCEDURE: The procedure, risks, benefits, and alternatives were explained to the patient, questions were encouraged and answered and informed consent was obtained. A timeout was performed prior to the initiation of the procedure. The operative site was prepped and draped in the usual sterile fashion and a sterile drape was applied covering the operative field. A sterile gown and sterile gloves were used for the procedure. Local anesthesia was provided with 1% Lidocaine with epinephrine. Ultrasound was used to localize the right kidney. Under direct ultrasound guidance, a 20 gauge needle was advanced into the renal collecting system. An ultrasound image documentation was performed. Access within the collecting system was confirmed with the efflux of urine followed by limited contrast injection. Under intermittent fluoroscopic guidance, an 0.018 wire was advanced into the collecting system and the tract was dilated with an Accustick stent. Next, over a short Amplatz wire, the track was further dilated ultimately allowing placement of a 10-French percutaneous nephrostomy catheter with end coiled and locked within the renal pelvis. Contrast was injected and several spot fluoroscopic images were obtained in various obliquities. The catheter was secured at the skin entrance site with an interrupted suture and a stat lock device and connected to a gravity bag. Dressings were applied. The patient tolerated procedure well  without immediate postprocedural complication. FINDINGS: Ultrasound scanning demonstrates a moderate to severely dilated right collecting system. Under a combination of ultrasound and fluoroscopic guidance, a posterior inferior calix was targeted allowing placement of a 10-French percutaneous nephrostomy catheter with end coiled and locked within the renal pelvis. Contrast injection confirmed appropriate positioning. Note, patient's known extensive right-sided nephrolithiasis is not well demonstrated fluoroscopically. IMPRESSION: Successful ultrasound and fluoroscopic guided placement of a right sided 10 French PCN. Electronically Signed   By: Simonne Come M.D.   On: 10/16/2022 12:04     Labs:   Basic Metabolic Panel: Recent Labs  Lab 10/14/22 1645 10/15/22 1940 10/16/22 0437 10/16/22 1431 10/17/22 1035 10/17/22 1108  NA 135 134* 136 140 139 141  K 3.4* 3.2* 3.5 2.9* 3.3* 3.6  CL 115* 113* 118* 117* 117* 114*  CO2 16* 15* 13* 17* 15*  --   GLUCOSE 92 131* 107* 95 84 86  BUN 11 12 11 10 10 8   CREATININE 0.97 1.01* 0.93 1.05* 1.00 1.00  CALCIUM 8.1* 8.2* 7.9* 8.0* 8.2*  --    GFR Estimated Creatinine Clearance: 68.6 mL/min (by C-G formula based on SCr of 1 mg/dL). Liver Function Tests: Recent Labs  Lab 10/14/22 1645 10/16/22 0437  AST 14* 11*  ALT 12 11  ALKPHOS 50 45  BILITOT 0.5 0.4  PROT 6.4*  5.4*  ALBUMIN 3.4* 3.1*   Recent Labs  Lab 10/14/22 1645  LIPASE 37   No results for input(s): "AMMONIA" in the last 168 hours. Coagulation profile Recent Labs  Lab 10/15/22 2349  INR 1.1    CBC: Recent Labs  Lab 10/14/22 1645 10/15/22 1940 10/16/22 0437 10/17/22 1108  WBC 4.6 6.7 3.2*  --   NEUTROABS 3.1  --   --   --   HGB 9.6* 9.4* 8.2* 9.2*  HCT 32.8* 31.8* 28.4* 27.0*  MCV 69.5* 69.7* 70.0*  --   PLT 175 159 133*  --    Cardiac Enzymes: No results for input(s): "CKTOTAL", "CKMB", "CKMBINDEX", "TROPONINI" in the last 168 hours. BNP: Invalid input(s):  "POCBNP" CBG: No results for input(s): "GLUCAP" in the last 168 hours. D-Dimer No results for input(s): "DDIMER" in the last 72 hours. Hgb A1c No results for input(s): "HGBA1C" in the last 72 hours. Lipid Profile No results for input(s): "CHOL", "HDL", "LDLCALC", "TRIG", "CHOLHDL", "LDLDIRECT" in the last 72 hours. Thyroid function studies No results for input(s): "TSH", "T4TOTAL", "T3FREE", "THYROIDAB" in the last 72 hours.  Invalid input(s): "FREET3" Anemia work up No results for input(s): "VITAMINB12", "FOLATE", "FERRITIN", "TIBC", "IRON", "RETICCTPCT" in the last 72 hours. Microbiology No results found for this or any previous visit (from the past 240 hour(s)).   Discharge Instructions:   Discharge Instructions     Diet general   Complete by: As directed    Increase activity slowly   Complete by: As directed    No wound care   Complete by: As directed       Allergies as of 10/17/2022       Reactions   Adhesive [tape] Rash   Pt states paper tape causes rash, but "plastic tape" is not problematic.         Medication List     STOP taking these medications    ferrous sulfate 325 (65 FE) MG tablet   HYDROcodone-acetaminophen 5-325 MG tablet Commonly known as: NORCO/VICODIN   potassium chloride SA 20 MEQ tablet Commonly known as: KLOR-CON M       TAKE these medications    acetaminophen 500 MG tablet Commonly known as: TYLENOL Take 2 tablets (1,000 mg total) by mouth every 8 (eight) hours as needed for mild pain.   levonorgestrel 20 MCG/24HR IUD Commonly known as: MIRENA 1 each by Intrauterine route once. Implanted November 2019   ondansetron 4 MG disintegrating tablet Commonly known as: ZOFRAN-ODT Take 1 tablet (4 mg total) by mouth every 8 (eight) hours as needed for nausea or vomiting.   ondansetron 4 MG tablet Commonly known as: ZOFRAN Take 1 tablet (4 mg total) by mouth every 6 (six) hours.   oxyCODONE-acetaminophen 5-325 MG tablet Commonly  known as: PERCOCET/ROXICET Take 1 tablet by mouth every 6 (six) hours as needed for severe pain.   senna-docusate 8.6-50 MG tablet Commonly known as: Senokot-S Take 1 tablet by mouth daily.   tamsulosin 0.4 MG Caps capsule Commonly known as: FLOMAX Take 0.4 mg by mouth daily.   zonisamide 100 MG capsule Commonly known as: ZONEGRAN Take 100 mg by mouth at bedtime.        Follow-up Information     [name], MD Follow up.   Specialty: Urology Why: Office will call with appointment Contact information: [address] [place] [address] [phone]                  Time coordinating discharge:  45 min  Signed:  Joseph Art DO  [facility] 10/17/2022, 2:10 PM

## 2022-10-17 NOTE — Anesthesia Postprocedure Evaluation (Signed)
Anesthesia Post Note  Patient: Phyllis House  Procedure(s) Performed: CYSTOSCOPY/URETEROSCOPY/HOLMIUM LASER/STENT PLACEMENT/RIGHT NEPHROSTOMY TUBE REMOVAL (Right: Ureter)     Patient location during evaluation: PACU Anesthesia Type: General Level of consciousness: awake and alert Pain management: pain level controlled Vital Signs Assessment: post-procedure vital signs reviewed and stable Respiratory status: spontaneous breathing, nonlabored ventilation and respiratory function stable Cardiovascular status: stable and blood pressure returned to baseline Anesthetic complications: no   No notable events documented.  Last Vitals:  Vitals:   10/17/22 1330 10/17/22 1405  BP: 105/72 116/65  Pulse: (!) 55 63  Resp: (!) 26 16  Temp:  (!) 36.4 C  SpO2: 94% 99%    Last Pain:  Vitals:   10/17/22 1455  TempSrc:   PainSc: 4                  Beryle Lathe

## 2022-10-17 NOTE — Discharge Instructions (Signed)
Removal of the stent: Remove the stent on Monday morning, October 23, 2022 by pulling the string as instructed.

## 2022-10-17 NOTE — Progress Notes (Signed)
S: Doing well without complaint.  Tolerating the right nephrostomy tube.  No dysuria or fever.  O: Vitals:   10/16/22 2257 10/17/22 0554  BP:  110/70  Pulse: 61 (!) 56  Resp:  18  Temp:  97.7 F (36.5 C)  SpO2: 100% 100%   CBC    Component Value Date/Time   WBC 3.2 (L) 10/16/2022 0437   RBC 4.06 10/16/2022 0437   HGB 8.2 (L) 10/16/2022 0437   HCT 28.4 (L) 10/16/2022 0437   PLT 133 (L) 10/16/2022 0437   MCV 70.0 (L) 10/16/2022 0437   MCH 20.2 (L) 10/16/2022 0437   MCHC 28.9 (L) 10/16/2022 0437   RDW 18.6 (H) 10/16/2022 0437   LYMPHSABS 1.0 10/14/2022 1645   MONOABS 0.4 10/14/2022 1645   EOSABS 0.0 10/14/2022 1645   BASOSABS 0.1 10/14/2022 1645   BMET    Component Value Date/Time   NA 140 10/16/2022 1431   K 2.9 (L) 10/16/2022 1431   CL 117 (H) 10/16/2022 1431   CO2 17 (L) 10/16/2022 1431   GLUCOSE 95 10/16/2022 1431   BUN 10 10/16/2022 1431   CREATININE 1.05 (H) 10/16/2022 1431   CREATININE 1.04 03/14/2013 1450   CALCIUM 8.0 (L) 10/16/2022 1431   GFRNONAA >60 10/16/2022 1431   GFRNONAA 68 03/14/2013 1450   PE: She is alert and oriented in the preop area Right nephrostomy tube in place with clear urine Abdomen soft and nontender   A/P: Right distal stones, right renal stones - I discussed with the patient the nature, potential benefits, risks and alternatives to cystoscopy, right URS/HLL/stent, including side effects of the proposed treatment, the likelihood of the patient achieving the goals of the procedure, and any potential problems that might occur during the procedure or recuperation.  We discussed failure of retrograde access.  She does have the nephrostomy tube.  All questions answered. Patient elects to proceed.

## 2022-10-17 NOTE — Anesthesia Preprocedure Evaluation (Addendum)
Anesthesia Evaluation  Patient identified by MRN, date of birth, ID band Patient awake    Reviewed: Allergy & Precautions, NPO status , Patient's Chart, lab work & pertinent test results  History of Anesthesia Complications (+) PONV and history of anesthetic complications  Airway Mallampati: II  TM Distance: >3 FB Neck ROM: Full    Dental  (+) Dental Advisory Given, Chipped   Pulmonary neg pulmonary ROS   Pulmonary exam normal        Cardiovascular negative cardio ROS Normal cardiovascular exam     Neuro/Psych  Headaches PSYCHIATRIC DISORDERS Anxiety Depression       GI/Hepatic negative GI ROS, Neg liver ROS,,,  Endo/Other   Hypokalemia, K 3.3 today (being repleted) Obesity   Renal/GU Renal disease (hx RTA)     Musculoskeletal negative musculoskeletal ROS (+)    Abdominal   Peds  Hematology  (+) Blood dyscrasia, anemia  Plt 133k    Anesthesia Other Findings HSV  Reproductive/Obstetrics  PCOS                              Anesthesia Physical Anesthesia Plan  ASA: 3  Anesthesia Plan: General   Post-op Pain Management: Tylenol PO (pre-op)*   Induction: Intravenous  PONV Risk Score and Plan: 4 or greater and Treatment may vary due to age or medical condition, Ondansetron, Dexamethasone and Midazolam  Airway Management Planned: LMA  Additional Equipment: None  Intra-op Plan:   Post-operative Plan: Extubation in OR  Informed Consent: I have reviewed the patients History and Physical, chart, labs and discussed the procedure including the risks, benefits and alternatives for the proposed anesthesia with the patient or authorized representative who has indicated his/her understanding and acceptance.     Dental advisory given  Plan Discussed with: CRNA and Anesthesiologist  Anesthesia Plan Comments:         Anesthesia Quick Evaluation

## 2022-10-17 NOTE — Op Note (Signed)
Preoperative diagnosis: Right renal and ureteral stones Postoperative diagnosis: Same  Procedure: Cystoscopy with right retrograde pyelogram, right ureteroscopy laser lithotripsy and stent placement.  Removal of right nephrostomy tube.  Surgeon: Mena Goes  Anesthesia: General  Indication for procedure: Phyllis House is a 47 year old female with metabolic acidosis and recurrent stones.  She is brought today for above procedure.  She was seen over the weekend with intractable pain from 3-4 right distal stones and a right nephrostomy tube was placed for temporizing measure to take pressure off the kidney.  Findings: 3 to 4 stones in the right distal ureter dusted.  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  Uneventful removal of right nephrostomy tube.  Description of procedure: After consent was obtained patient brought to the operating room.  After adequate anesthesia she is placed lithotomy position and prepped and draped in the usual sterile fashion.  Timeout was performed to confirm the patient and procedure.  The cystoscope was passed per urethra and the bladder inspected.  The right ureteral orifice was cannulated with a 5 [place] open-ended catheter and retrograde injection of contrast was performed.  I then advanced a sensor wire and coiled that in the upper calyx.  I then passed a single channel semirigid ureteroscope and located the right distal stone.  Scope passed without difficulty.  A 240 m laser fiber advanced.  The stones in the distal ureter were dusted.  They dusted quite readily.  With withdrawal of the scope a lot of the fragments washed into the bladder.  Other fragments washed up toward the kidney as the nephrostomy tube was left open to drain the system.  I then passed the semirigid up into the proximal ureter and noted no significant stone fragments or injury.  I passed a Glidewire under direct  vision and backed out the semirigid scope.  I passed a short access sheath which went easily without resistance.  I then passed a dual channel digital ureteroscope into the kidney all the calyces were inspected after the nephrostomy tube was clamped.  5 stones were noted in the kidney and these were all dusted.  No other stones were noted.  I took a look at the CT to make sure I uncovered all the calyces and renal papilla.  I then went and unclamped her nephrostomy tube.  I unhooked the catheter security cramp, cut the suture at her skin.  I then opened and released the string holding the coil in place.  The nephrostomy tube was then gently removed without difficulty and intact.  I then scrubbed back in and reinspected the collecting system with the digital scope.  Again no other stone fragments were noted.  No significant bleeding was noted.  The access sheath was backed out on the ureteroscope and again the collecting system renal pelvis and ureter inspected on the way out no significant stone fragments were noted and no injury.  The wire was then backloaded on the cystoscope and a 6 x 24 cm stent advanced.  The wire was removed the good coil seen in the kidney and a good coil in the bladder.  The bladder was drained and the scope removed.  She was awakened taken the cover room in stable condition.  Complications: None  Blood loss: Minimal  Specimens: None  Drains: 6 x 24 cm right ureteral stent with string  Disposition: Patient stable to PACU - I discussed with Barbara Cower the procedure, postop  care and follow-up.  Description of procedure:

## 2022-10-17 NOTE — Transfer of Care (Signed)
Immediate Anesthesia Transfer of Care Note  Patient: Phyllis House  Procedure(s) Performed: CYSTOSCOPY/URETEROSCOPY/HOLMIUM LASER/STENT PLACEMENT/RIGHT NEPHROSTOMY TUBE REMOVAL (Right: Ureter)  Patient Location: PACU  Anesthesia Type:General  Level of Consciousness: drowsy  Airway & Oxygen Therapy: Patient Spontanous Breathing and Patient connected to face mask oxygen  Post-op Assessment: Report given to RN and Post -op Vital signs reviewed and stable  Post vital signs: Reviewed and stable  Last Vitals:  Vitals Value Taken Time  BP 101/61 10/17/22 1248  Temp    Pulse 61 10/17/22 1250  Resp 12 10/17/22 1250  SpO2 100 % 10/17/22 1250  Vitals shown include unvalidated device data.  Last Pain:  Vitals:   10/17/22 1011  TempSrc: Oral  PainSc: 7          Complications: No notable events documented.

## 2022-10-17 NOTE — Anesthesia Procedure Notes (Signed)
Procedure Name: LMA Insertion Date/Time: 10/17/2022 11:26 AM  Performed by: Epimenio Sarin, CRNAPre-anesthesia Checklist: Patient identified, Emergency Drugs available, Suction available, Patient being monitored and Timeout performed Patient Re-evaluated:Patient Re-evaluated prior to induction Oxygen Delivery Method: Circle system utilized Preoxygenation: Pre-oxygenation with 100% oxygen Induction Type: IV induction LMA: LMA with gastric port inserted LMA Size: 4.0 Number of attempts: 1 Dental Injury: Teeth and Oropharynx as per pre-operative assessment

## 2022-10-18 ENCOUNTER — Encounter (HOSPITAL_COMMUNITY): Payer: Self-pay | Admitting: Urology

## 2022-10-18 ENCOUNTER — Telehealth: Payer: Self-pay

## 2022-10-18 NOTE — Transitions of Care (Post Inpatient/ED Visit) (Signed)
   10/18/2022  Name: Phyllis House MRN: [id number] DOB: 1975/09/05  Today's TOC FU Call Status:    Attempted to reach the patient regarding the most recent Inpatient/ED visit.  Follow Up Plan: No further outreach attempts will be made at this time. We have been unable to contact the patient.  Signature TB,CMA

## 2022-12-01 ENCOUNTER — Emergency Department (HOSPITAL_COMMUNITY): Payer: BC Managed Care – PPO

## 2022-12-01 ENCOUNTER — Ambulatory Visit (HOSPITAL_COMMUNITY)
Admission: EM | Admit: 2022-12-01 | Discharge: 2022-12-01 | Disposition: A | Discharge status: Home / Self Care | Payer: BC Managed Care – PPO | Attending: Emergency Medicine | Admitting: Emergency Medicine

## 2022-12-01 ENCOUNTER — Emergency Department (HOSPITAL_COMMUNITY): Payer: BC Managed Care – PPO | Admitting: Anesthesiology

## 2022-12-01 ENCOUNTER — Other Ambulatory Visit: Payer: Self-pay

## 2022-12-01 ENCOUNTER — Encounter (HOSPITAL_COMMUNITY): Payer: Self-pay

## 2022-12-01 ENCOUNTER — Encounter (HOSPITAL_COMMUNITY)
Admission: EM | Disposition: A | Discharge status: Home / Self Care | Payer: Self-pay | Source: Home / Self Care | Attending: Emergency Medicine

## 2022-12-01 DIAGNOSIS — N2589 Other disorders resulting from impaired renal tubular function: Secondary | ICD-10-CM | POA: Diagnosis not present

## 2022-12-01 DIAGNOSIS — R110 Nausea: Secondary | ICD-10-CM | POA: Diagnosis not present

## 2022-12-01 DIAGNOSIS — N179 Acute kidney failure, unspecified: Secondary | ICD-10-CM | POA: Diagnosis not present

## 2022-12-01 DIAGNOSIS — N132 Hydronephrosis with renal and ureteral calculous obstruction: Secondary | ICD-10-CM | POA: Insufficient documentation

## 2022-12-01 DIAGNOSIS — N200 Calculus of kidney: Secondary | ICD-10-CM

## 2022-12-01 DIAGNOSIS — D649 Anemia, unspecified: Secondary | ICD-10-CM | POA: Diagnosis not present

## 2022-12-01 HISTORY — PX: CYSTOSCOPY/URETEROSCOPY/HOLMIUM LASER/STENT PLACEMENT: SHX6546

## 2022-12-01 LAB — CBC WITH DIFFERENTIAL/PLATELET
Abs Immature Granulocytes: 0.03 10*3/uL (ref 0.00–0.07)
Basophils Absolute: 0.1 10*3/uL (ref 0.0–0.1)
Basophils Relative: 2 %
Eosinophils Absolute: 0.1 10*3/uL (ref 0.0–0.5)
Eosinophils Relative: 2 %
HCT: 34.2 % — ABNORMAL LOW (ref 36.0–46.0)
Hemoglobin: 10 g/dL — ABNORMAL LOW (ref 12.0–15.0)
Immature Granulocytes: 1 %
Lymphocytes Relative: 16 %
Lymphs Abs: 0.8 10*3/uL (ref 0.7–4.0)
MCH: 20.9 pg — ABNORMAL LOW (ref 26.0–34.0)
MCHC: 29.2 g/dL — ABNORMAL LOW (ref 30.0–36.0)
MCV: 71.4 fL — ABNORMAL LOW (ref 80.0–100.0)
Monocytes Absolute: 0.4 10*3/uL (ref 0.1–1.0)
Monocytes Relative: 7 %
Neutro Abs: 3.8 10*3/uL (ref 1.7–7.7)
Neutrophils Relative %: 72 %
Platelets: 150 10*3/uL (ref 150–400)
RBC: 4.79 MIL/uL (ref 3.87–5.11)
RDW: 19.9 % — ABNORMAL HIGH (ref 11.5–15.5)
WBC: 5.2 10*3/uL (ref 4.0–10.5)
nRBC: 0 % (ref 0.0–0.2)

## 2022-12-01 LAB — COMPREHENSIVE METABOLIC PANEL
ALT: 13 U/L (ref 0–44)
AST: 15 U/L (ref 15–41)
Albumin: 3.9 g/dL (ref 3.5–5.0)
Alkaline Phosphatase: 52 U/L (ref 38–126)
Anion gap: 8 (ref 5–15)
BUN: 13 mg/dL (ref 6–20)
CO2: 14 mmol/L — ABNORMAL LOW (ref 22–32)
Calcium: 8.6 mg/dL — ABNORMAL LOW (ref 8.9–10.3)
Chloride: 113 mmol/L — ABNORMAL HIGH (ref 98–111)
Creatinine, Ser: 1.11 mg/dL — ABNORMAL HIGH (ref 0.44–1.00)
GFR, Estimated: >60 mL/min (ref >60)
Glucose, Bld: 102 mg/dL — ABNORMAL HIGH (ref 70–99)
Potassium: 3.4 mmol/L — ABNORMAL LOW (ref 3.5–5.1)
Sodium: 135 mmol/L (ref 135–145)
Total Bilirubin: 0.3 mg/dL (ref 0.3–1.2)
Total Protein: 6.7 g/dL (ref 6.5–8.1)

## 2022-12-01 LAB — URINALYSIS, ROUTINE W REFLEX MICROSCOPIC
Bacteria, UA: NONE SEEN
Bilirubin Urine: NEGATIVE
Glucose, UA: NEGATIVE mg/dL
Ketones, ur: NEGATIVE mg/dL
Nitrite: NEGATIVE
Protein, ur: 30 mg/dL — AB
Specific Gravity, Urine: 1.011 (ref 1.005–1.030)
pH: 6 (ref 5.0–8.0)

## 2022-12-01 LAB — PREGNANCY, URINE: Preg Test, Ur: NEGATIVE

## 2022-12-01 SURGERY — CYSTOSCOPY/URETEROSCOPY/HOLMIUM LASER/STENT PLACEMENT
Anesthesia: General | Laterality: Left

## 2022-12-01 MED ORDER — KETOROLAC TROMETHAMINE 30 MG/ML IJ SOLN
INTRAMUSCULAR | Status: DC | PRN
  Administered 2022-12-01: 30 mg via INTRAVENOUS

## 2022-12-01 MED ORDER — ACETAMINOPHEN 500 MG PO TABS
1000.0000 mg | ORAL_TABLET | Freq: Once | ORAL | Status: AC
  Administered 2022-12-01: 1000 mg via ORAL
  Filled 2022-12-01: qty 2

## 2022-12-01 MED ORDER — PROPOFOL 10 MG/ML IV BOLUS
INTRAVENOUS | Status: DC | PRN
Start: 2022-12-01 — End: 2022-12-01
  Administered 2022-12-01: 200 mg via INTRAVENOUS

## 2022-12-01 MED ORDER — PROPOFOL 10 MG/ML IV BOLUS
INTRAVENOUS | Status: AC
  Filled 2022-12-01: qty 20

## 2022-12-01 MED ORDER — EPHEDRINE SULFATE-NACL 50-0.9 MG/10ML-% IV SOSY
PREFILLED_SYRINGE | INTRAVENOUS | Status: DC | PRN
  Administered 2022-12-01: 10 mg via INTRAVENOUS

## 2022-12-01 MED ORDER — ONDANSETRON HCL 4 MG/2ML IJ SOLN
4.0000 mg | Freq: Once | INTRAMUSCULAR | Status: AC
  Administered 2022-12-01: 4 mg via INTRAVENOUS
  Filled 2022-12-01: qty 2

## 2022-12-01 MED ORDER — DEXTROSE 5 % IV SOLN
INTRAVENOUS | Status: DC | PRN
  Administered 2022-12-01: 2 g via INTRAVENOUS

## 2022-12-01 MED ORDER — OXYCODONE HCL 5 MG PO TABS: 5.0000 mg | ORAL_TABLET | Freq: Once | ORAL | Status: DC | PRN

## 2022-12-01 MED ORDER — AMISULPRIDE (ANTIEMETIC) 5 MG/2ML IV SOLN
10.0000 mg | Freq: Once | INTRAVENOUS | Status: AC | PRN
  Administered 2022-12-01: 10 mg via INTRAVENOUS

## 2022-12-01 MED ORDER — SUCCINYLCHOLINE CHLORIDE 200 MG/10ML IV SOSY
PREFILLED_SYRINGE | INTRAVENOUS | Status: DC | PRN
  Administered 2022-12-01: 100 mg via INTRAVENOUS

## 2022-12-01 MED ORDER — KETOROLAC TROMETHAMINE 30 MG/ML IJ SOLN
INTRAMUSCULAR | Status: AC
  Filled 2022-12-01: qty 1

## 2022-12-01 MED ORDER — SCOPOLAMINE 1 MG/3DAYS TD PT72
MEDICATED_PATCH | TRANSDERMAL | Status: AC
  Filled 2022-12-01: qty 1

## 2022-12-01 MED ORDER — SCOPOLAMINE 1 MG/3DAYS TD PT72
MEDICATED_PATCH | TRANSDERMAL | Status: DC | PRN
  Administered 2022-12-01: 1 via TRANSDERMAL

## 2022-12-01 MED ORDER — HYDROMORPHONE HCL 1 MG/ML IJ SOLN
0.5000 mg | Freq: Once | INTRAMUSCULAR | Status: AC
  Administered 2022-12-01: 0.5 mg via INTRAVENOUS
  Filled 2022-12-01: qty 1

## 2022-12-01 MED ORDER — FENTANYL CITRATE (PF) 100 MCG/2ML IJ SOLN
INTRAMUSCULAR | Status: DC | PRN
  Administered 2022-12-01: 100 ug via INTRAVENOUS

## 2022-12-01 MED ORDER — CHLORHEXIDINE GLUCONATE 0.12 % MT SOLN: 15.0000 mL | Freq: Once | OROMUCOSAL | Status: DC

## 2022-12-01 MED ORDER — SODIUM CHLORIDE 0.9 % IV SOLN
INTRAVENOUS | Status: AC
  Filled 2022-12-01: qty 20

## 2022-12-01 MED ORDER — ONDANSETRON HCL 4 MG/2ML IJ SOLN
INTRAMUSCULAR | Status: DC | PRN
  Administered 2022-12-01: 4 mg via INTRAVENOUS

## 2022-12-01 MED ORDER — LIDOCAINE HCL URETHRAL/MUCOSAL 2 % EX GEL
CUTANEOUS | Status: AC
  Filled 2022-12-01: qty 30

## 2022-12-01 MED ORDER — SODIUM CHLORIDE 0.9 % IR SOLN
Status: DC | PRN
  Administered 2022-12-01: 3000 mL via INTRAVESICAL

## 2022-12-01 MED ORDER — LACTATED RINGERS IV SOLN: INTRAVENOUS | Status: DC

## 2022-12-01 MED ORDER — LACTATED RINGERS IV BOLUS
1000.0000 mL | Freq: Once | INTRAVENOUS | Status: AC
  Administered 2022-12-01: 1000 mL via INTRAVENOUS

## 2022-12-01 MED ORDER — ACETAMINOPHEN 10 MG/ML IV SOLN: 1000.0000 mg | Freq: Once | INTRAVENOUS | Status: DC | PRN

## 2022-12-01 MED ORDER — LIDOCAINE 2% (20 MG/ML) 5 ML SYRINGE
INTRAMUSCULAR | Status: DC | PRN
  Administered 2022-12-01: 100 mg via INTRAVENOUS

## 2022-12-01 MED ORDER — FENTANYL CITRATE PF 50 MCG/ML IJ SOSY: 25.0000 ug | PREFILLED_SYRINGE | INTRAMUSCULAR | Status: DC | PRN

## 2022-12-01 MED ORDER — DEXMEDETOMIDINE HCL IN NACL 80 MCG/20ML IV SOLN
INTRAVENOUS | Status: DC | PRN
  Administered 2022-12-01 (×2): 4 ug via INTRAVENOUS

## 2022-12-01 MED ORDER — DEXAMETHASONE SODIUM PHOSPHATE 10 MG/ML IJ SOLN
INTRAMUSCULAR | Status: DC | PRN
  Administered 2022-12-01: 10 mg via INTRAVENOUS

## 2022-12-01 MED ORDER — METOCLOPRAMIDE HCL 5 MG/ML IJ SOLN
10.0000 mg | Freq: Once | INTRAMUSCULAR | Status: AC
  Administered 2022-12-01: 10 mg via INTRAVENOUS
  Filled 2022-12-01: qty 2

## 2022-12-01 MED ORDER — OXYCODONE HCL 5 MG/5ML PO SOLN: 5.0000 mg | Freq: Once | ORAL | Status: DC | PRN

## 2022-12-01 MED ORDER — MIDAZOLAM HCL 5 MG/5ML IJ SOLN
INTRAMUSCULAR | Status: DC | PRN
  Administered 2022-12-01: 2 mg via INTRAVENOUS

## 2022-12-01 MED ORDER — MIDAZOLAM HCL 2 MG/2ML IJ SOLN
INTRAMUSCULAR | Status: AC
  Filled 2022-12-01: qty 2

## 2022-12-01 MED ORDER — ORAL CARE MOUTH RINSE: 15.0000 mL | Freq: Once | OROMUCOSAL | Status: DC

## 2022-12-01 MED ORDER — FENTANYL CITRATE (PF) 100 MCG/2ML IJ SOLN
INTRAMUSCULAR | Status: AC
  Filled 2022-12-01: qty 2

## 2022-12-01 MED ORDER — AMISULPRIDE (ANTIEMETIC) 5 MG/2ML IV SOLN
INTRAVENOUS | Status: AC
  Filled 2022-12-01: qty 4

## 2022-12-01 SURGICAL SUPPLY — 23 items
BAG URO CATCHER STRL LF (MISCELLANEOUS) ×2 IMPLANT
BASKET ZERO TIP NITINOL 2.4FR (BASKET) IMPLANT
BSKT STON RTRVL ZERO TP 2.4FR (BASKET)
CATH URETERAL DUAL LUMEN 10F (MISCELLANEOUS) IMPLANT
CATH URETL OPEN END 6FR 70 (CATHETERS) IMPLANT
CLOTH BEACON ORANGE TIMEOUT ST (SAFETY) ×2 IMPLANT
FIBER LASER MOSES 200 DFL (Laser) IMPLANT
FIBER LASER MOSES 365 DFL (Laser) IMPLANT
GLOVE BIO SURGEON STRL SZ8.5 (GLOVE) ×2 IMPLANT
GOWN STRL REUS W/ TWL XL LVL3 (GOWN DISPOSABLE) ×2 IMPLANT
GOWN STRL REUS W/TWL XL LVL3 (GOWN DISPOSABLE) ×2
GUIDEWIRE AMPLATZ STIFF 0.35 (WIRE) IMPLANT
GUIDEWIRE STR DUAL SENSOR (WIRE) ×2 IMPLANT
GUIDEWIRE SUPER STIFF (WIRE) IMPLANT
GUIDEWIRE ZIPWRE .038 STRAIGHT (WIRE) IMPLANT
KIT TURNOVER KIT A (KITS) IMPLANT
MANIFOLD NEPTUNE II (INSTRUMENTS) ×2 IMPLANT
PACK CYSTO (CUSTOM PROCEDURE TRAY) ×2 IMPLANT
SHEATH NAVIGATOR HD 11/13X28 (SHEATH) IMPLANT
SHEATH NAVIGATOR HD 11/13X36 (SHEATH) IMPLANT
STENT URET 6FRX22 CONTOUR (STENTS) IMPLANT
TUBING CONNECTING 10 (TUBING) ×1 IMPLANT
TUBING UROLOGY SET (TUBING) ×1 IMPLANT

## 2022-12-01 NOTE — Anesthesia Procedure Notes (Signed)
Procedure Name: Intubation Date/Time: 12/01/2022 6:48 PM  Performed by: ,  D, CRNAPre-anesthesia Checklist: Patient identified, Emergency Drugs available, Suction available and Patient being monitored Patient Re-evaluated:Patient Re-evaluated prior to induction Oxygen Delivery Method: Circle system utilized Preoxygenation: Pre-oxygenation with 100% oxygen Induction Type: IV induction and Rapid sequence Laryngoscope Size: Mac and 4 Grade View: Grade II Tube type: Oral Tube size: 7.0 mm Number of attempts: 1 Airway Equipment and Method: Stylet and Oral airway Placement Confirmation: ETT inserted through vocal cords under direct vision, positive ETCO2 and breath sounds checked- equal and bilateral Secured at: 21 cm Tube secured with: Tape Dental Injury: Teeth and Oropharynx as per pre-operative assessment

## 2022-12-01 NOTE — Anesthesia Preprocedure Evaluation (Addendum)
Anesthesia Evaluation  Patient identified by MRN, date of birth, ID band Patient awake    Reviewed: Allergy & Precautions, NPO status , Patient's Chart, lab work & pertinent test results  History of Anesthesia Complications (+) PONV and history of anesthetic complications  Airway Mallampati: I  TM Distance: >3 FB Neck ROM: Full    Dental  (+) Dental Advisory Given   Pulmonary neg pulmonary ROS   Pulmonary exam normal breath sounds clear to auscultation       Cardiovascular negative cardio ROS  Rhythm:Regular Rate:Normal     Neuro/Psych  Headaches, neg Seizures PSYCHIATRIC DISORDERS Anxiety Depression       GI/Hepatic negative GI ROS, Neg liver ROS,,,  Endo/Other  negative endocrine ROS    Renal/GU Renal disease (stones, h/o renal tubular acidosis type 1)     Musculoskeletal   Abdominal  (+) + obese  Peds  Hematology  (+) Blood dyscrasia, anemia Lab Results      Component                Value               Date                      WBC                      5.2                 12/01/2022                HGB                      10.0 (L)            12/01/2022                HCT                      34.2 (L)            12/01/2022                MCV                      71.4 (L)            12/01/2022                PLT                      150                 12/01/2022              Anesthesia Other Findings Patient has been having a lot of nausea that hasn't improved with anti-emetics.  Reproductive/Obstetrics PCOS                             Anesthesia Physical Anesthesia Plan  ASA: 2  Anesthesia Plan: General   Post-op Pain Management:    Induction: Intravenous and Rapid sequence  PONV Risk Score and Plan: 4 or greater and Ondansetron, Dexamethasone, Midazolam, Treatment may vary due to age or medical condition and Scopolamine patch - Pre-op  Airway Management Planned: Oral  ETT  Additional Equipment:   Intra-op Plan:   Post-operative Plan: Extubation in OR  Informed Consent: I have reviewed the patients History and Physical, chart, labs and discussed the procedure including the risks, benefits and alternatives for the proposed anesthesia with the patient or authorized representative who has indicated his/her understanding and acceptance.     Dental advisory given  Plan Discussed with: CRNA and Anesthesiologist  Anesthesia Plan Comments: (Risks of general anesthesia discussed including, but not limited to, sore throat, hoarse voice, chipped/damaged teeth, injury to vocal cords, nausea and vomiting, allergic reactions, lung infection, heart attack, stroke, and death. All questions answered. )        Anesthesia Quick Evaluation

## 2022-12-01 NOTE — Transfer of Care (Signed)
Immediate Anesthesia Transfer of Care Note  Patient: Jaslyne Beeck  Procedure(s) Performed: CYSTOSCOPY/URETEROSCOPY/HOLMIUM LASER/STENT PLACEMENT (Left)  Patient Location: PACU  Anesthesia Type:General  Level of Consciousness: awake, alert , and oriented  Airway & Oxygen Therapy: Patient Spontanous Breathing and Patient connected to face mask oxygen  Post-op Assessment: Report given to RN and Post -op Vital signs reviewed and stable  Post vital signs: Reviewed and stable  Last Vitals:  Vitals Value Taken Time  BP    Temp    Pulse    Resp 16 12/01/22 1950  SpO2    Vitals shown include unfiled device data.  Last Pain:  Vitals:   12/01/22 1810  TempSrc: Oral  PainSc:       Patients Stated Pain Goal: 4 (12/01/22 1737)  Complications: No notable events documented.

## 2022-12-01 NOTE — Progress Notes (Signed)
[facility] Pre-op Review  Pre-op checklist: na, pt in ED  NPO: order placed  Labs: WNL, u-preg negative  Consent: no order, needs to be obtained  H&P: not yet completed  Vitals: WNL  O2 requirements: RA  MAR/PTA review: reviewed  IV: 20g LFA  Floor nurse name:  in ED  Additional info: will likely be done later this evening

## 2022-12-01 NOTE — Anesthesia Postprocedure Evaluation (Signed)
Anesthesia Post Note  Patient: Phyllis House  Procedure(s) Performed: CYSTOSCOPY/URETEROSCOPY/HOLMIUM LASER/STENT PLACEMENT (Left)     Patient location during evaluation: PACU Anesthesia Type: General Level of consciousness: awake Pain management: pain level controlled Vital Signs Assessment: post-procedure vital signs reviewed and stable Respiratory status: spontaneous breathing, nonlabored ventilation and respiratory function stable Cardiovascular status: blood pressure returned to baseline and stable Postop Assessment: no apparent nausea or vomiting Anesthetic complications: no   No notable events documented.  Last Vitals:  Vitals:   12/01/22 2000 12/01/22 2015  BP: 107/61 112/88  Pulse: 65 63  Resp: 14 18  Temp:    SpO2: 100% 100%    Last Pain:  Vitals:   12/01/22 2015  TempSrc:   PainSc: 0-No pain                 Linton Rump

## 2022-12-01 NOTE — Op Note (Signed)
OPERATIVE NOTE   Patient Name: Phyllis House  MRN: [id number]   Date of Procedure: 12/01/2022   Preoperative diagnosis:  Multiple distal left ureteral stones  Postoperative diagnosis:  same  Procedure:  Cystoscopy, left retrograde ureteropyelogram, left ureteroscopy with laser lithotripsy/stone extraction and placement of left 6 x 22 cm double-J stent with tether removed  Attending: Concepcion Living, MD  Anesthesia: geta  Estimated blood loss: minimal  Fluids: Per anesthesia record  Drains: 6x22cm dj stent with tether removed  Specimens: none  Antibiotics: rocephin 2gm iv  Findings: Multiple left distal ureteral calculi  Indications:  Left ureteral stones  Description of Procedure:  The patient was brought to the operating room where she was correctly identified and subsequently underwent satisfactory induction of general endotracheal anesthesia.  She was then positioned in the modified dorsolithotomy position and her external genitalia were prepped and draped in the usual sterile fashion.  A 22 French panendoscope was subsequently inserted per urethra.  The bladder was inspected.  The ureteral opening on the left side appeared abnormal with bulging stone in the intramural portion I could see some stone almost crowning at the ureteral orifice on the left.  The right ureteral opening was normal.  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  At this time a left retrograde ureteropyelogram was performed.  This revealed bulky stone occupying a length of approximately 2 cm in the distal ureter and I could not get contrast to go much above the dilated distal ureter region.  At this time I attempted to place a sensor wire as a safety wire and was unable to pass it.  I subsequently loaded a hydrophilic zip wire inside the open-ended catheter and with the catheter located at the ureteral opening I was able to manipulate the zip wire up the ureter and  coil within the renal collecting system.  I was subsequently able to gently manipulate the open-ended catheter up up beyond the stone.  I then obtained an additional retrograde injection which showed marked proximal hydroureteral nephrosis.  I exchanged the zip wire for a sensor wire as a safety wire.  At this point in time I deployed the semirigid ureteroscope.  Just inside the ureteral opening I encountered the first large leading stone.  I used the Moses laser with a 360 m fiber and was able to break the stone up into multiple small pieces and given the distal location they washed out into the bladder.  I then advanced the scope proximally and continuously using the laser was able to fragment all the remaining stones into multiple small pieces most of which washed out into the bladder.  I also basketed several fragments in the 3 mm range and dropped most of these within the bladder as well.  Ultimately I was able to remove all of the significant fragments other than some remaining dust.  After all the stone was removed I placed a 6 x 22 cm double-J stent with tether removed under endoscopic and fluoroscopic control with good position obtained proximally and distally.  The bladder was then drained.  The patient was administered 2% lidocaine jelly per urethra and the procedure terminated.  The patient tolerated the procedure well.  There were no apparent complications.  Complications: None  Condition: Stable, extubated, transferred to PACU  Plan: FU 1-2 weeks for stent removal

## 2022-12-01 NOTE — Consult Note (Signed)
Urology Consult Note   Requesting Attending Physician:  Linwood Dibbles, MD Service Providing Consult: Urology  Consulting Attending: Dr. Margo Aye   Reason for Consult: Left obstructing ureteral stones with associated hydroureteronephrosis.  HPI: Phyllis House is seen in consultation for reasons noted above at the request of Linwood Dibbles, MD  This is a 47 y.o. female presenting to [facility] with flank pain.  Patient has a past medical history of a renal tubular acidosis, making her prolific stone producer.  We have operated on her in number of times.  She is followed [facility] M[facility] Go[facility].  She recently enrolled in the stone clinic at [facility], unfortunately her most recent obstructive uropathy postponed treatment.  CT A/P shows multiple ureteral stones, the largest of which are around 8 mm at the level of the UVJ.  On assessment patient was alert, oriented, in no distress.  She shows no sign of systemic infection and her pain is well-controlled.  She last ate last night and is amenable to proceeding with cystoscopy, laser lithotripsy, and stent placement.   Past Medical History: Past Medical History:  Diagnosis Date   Anxiety    Depression    Eczema    H/O varicella    Hematuria    History of herpes genitalis    History of kidney stones    pt has long hx calcium phosphate stones   Hypocalcemia    Pneumonia    once 10+ yeaers ago   Polycystic ovarian syndrome    PONV (postoperative nausea and vomiting)    Renal tubular acidosis type I    Right ureteral stone    Seasonal allergies    Urgency of urination     Past Surgical History:  Past Surgical History:  Procedure Laterality Date   CYSTOSCOPY W/ RETROGRADES Right 12/12/2019   Procedure: CYSTOSCOPY WITH RIGHT RETROGRADE PYELOGRAM AND STENT PLACEMENT;  Surgeon: Heloise Purpura, MD;  Location: [facility];  Service: Urology;  Laterality: Right;   CYSTOSCOPY W/ URETERAL STENT PLACEMENT Left 07/01/2012    Procedure: CYSTOSCOPY WITH RETROGRADE PYELOGRAM/URETERAL STENT PLACEMENT;  Surgeon: Milford Cage, MD;  Location: [facility];  Service: Urology;  Laterality: Left;   CYSTOSCOPY W/ URETERAL STENT PLACEMENT Right 07/27/2016   Procedure: CYSTOSCOPY WITH RETROGRADE PYELOGRAM/URETERAL STENT PLACEMENT;  Surgeon: Alfredo Martinez, MD;  Location: [facility];  Service: Urology;  Laterality: Right;   CYSTOSCOPY W/ URETERAL STENT PLACEMENT Right 05/08/2019   Procedure: CYSTOSCOPY WITH RETROGRADE PYELOGRAM/URETERAL STENT PLACEMENT;  Surgeon: Malen Gauze, MD;  Location: [facility];  Service: Urology;  Laterality: Right;   CYSTOSCOPY W/ URETERAL STENT PLACEMENT Right 03/05/2021   Procedure: CYSTOURETEROSCOPY WITH RETROGRADE PYELOGRAM/URETERAL STENT PLACEMENT RIGHT;  Surgeon: Malen Gauze, MD;  Location: [facility];  Service: Urology;  Laterality: Right;   CYSTOSCOPY WITH RETROGRADE PYELOGRAM, URETEROSCOPY AND STENT PLACEMENT Left 07/15/2012   Procedure: CYSTOSCOPY WITH left RETROGRADE PYELOGRAM,left  URETEROSCOPY AND left STENT exchange ;  Surgeon: Milford Cage, MD;  Location: [facility];  Service: Urology;  Laterality: Left;  LEFT URETER STENT EXCHANGE     CYSTOSCOPY WITH RETROGRADE PYELOGRAM, URETEROSCOPY AND STENT PLACEMENT Right 08/11/2016   Procedure: CYSTOSCOPY WITH RIGHT RETROGRADE PYELOGRAM,RIGHT  URETEROSCOPY AND STENT CHANGE;  Surgeon: Ihor Gully, MD;  Location: [facility];  Service: Urology;  Laterality: Right;   CYSTOSCOPY WITH URETEROSCOPY AND STENT PLACEMENT Right 07/12/2021   Procedure: CYSTOSCOPY WITH RIGHT URETEROSCOPY AND STENT PLACEMENT;  Surgeon: [name], MD;  Location:  [facility];  Service: Urology;  Laterality: Right;   CYSTOSCOPY/RETROGRADE/URETEROSCOPY/STONE EXTRACTION WITH BASKET  AGE 3   CYSTOSCOPY/URETEROSCOPY/HOLMIUM LASER/STENT PLACEMENT Bilateral 09/19/2018   Procedure: CYSTOSCOPY/RETROGRADE/URETEROSCOPY/HOLMIUM LASER/STENT  PLACEMENT;  Surgeon: [name], MD;  Location: [facility];  Service: Urology;  Laterality: Bilateral;   CYSTOSCOPY/URETEROSCOPY/HOLMIUM LASER/STENT PLACEMENT Right 10/22/2018   Procedure: CYSTOSCOPY/RETROGRADE/URETEROSCOPY/HOLMIUM LASER/STENT PLACEMENT;  Surgeon: [name], MD;  Location: [facility];  Service: Urology;  Laterality: Right;  ONLY NEEDS 60 MIN   CYSTOSCOPY/URETEROSCOPY/HOLMIUM LASER/STENT PLACEMENT Right 02/11/2019   Procedure: CYSTOSCOPY/RETROGRADE/URETEROSCOPY/HOLMIUM LASER/STENT PLACEMENT;  Surgeon: [name], MD;  Location: [facility];  Service: Urology;  Laterality: Right;   CYSTOSCOPY/URETEROSCOPY/HOLMIUM LASER/STENT PLACEMENT Bilateral 06/03/2019   Procedure: CYSTOSCOPY/RETROGRADE/URETEROSCOPY/HOLMIUM LASER/STENT PLACEMENT;  Surgeon: [name], MD;  Location: [facility];  Service: Urology;  Laterality: Bilateral;   CYSTOSCOPY/URETEROSCOPY/HOLMIUM LASER/STENT PLACEMENT Right 12/30/2019   Procedure: CYSTOSCOPY, RETROGRADE /URETEROSCOPY/HOLMIUM LASER/STENT EXCHANGE;  Surgeon: [name], MD;  Location[facility];  Service: Urology;  Laterality: Right;   CYSTOSCOPY/URETEROSCOPY/HOLMIUM LASER/STENT PLACEMENT Right 10/17/2022   Procedure: CYSTOSCOPY/URETEROSCOPY/HOLMIUM LASER/STENT PLACEMENT/RIGHT NEPHROSTOMY TUBE REMOVAL;  Surgeon: [name], MD;  Location: [facility];  Service: Urology;  Laterality: Right;   EXTRACORPOREAL SHOCK WAVE LITHOTRIPSY Left 08/07/2019   Procedure: EXTRACORPOREAL SHOCK WAVE LITHOTRIPSY (ESWL);  Surgeon: Noel Christmas, MD;  Location: [facility];  Service: Urology;  Laterality: Left;   EXTRACORPOREAL SHOCK WAVE LITHOTRIPSY Left 04/08/2020   Procedure: EXTRACORPOREAL SHOCK WAVE LITHOTRIPSY (ESWL);  Surgeon: Belva Agee, MD;  Location: [facility];  Service: Urology;  Laterality: Left;   EXTRACORPOREAL SHOCK WAVE  LITHOTRIPSY Right 12/16/2020   Procedure: EXTRACORPOREAL SHOCK WAVE LITHOTRIPSY (ESWL);  Surgeon: Alfredo Martinez, MD;  Location: [facility];  Service: Urology;  Laterality: Right;   HOLMIUM LASER APPLICATION Left 07/15/2012   Procedure: HOLMIUM LASER APPLICATION;  Surgeon: Milford Cage, MD;  Location: [facility];  Service: Urology;  Laterality: Left;   HOLMIUM LASER APPLICATION Right 08/11/2016   Procedure: HOLMIUM LASER APPLICATION;  Surgeon: Ihor Gully, MD;  Location: [facility];  Service: Urology;  Laterality: Right;   HOLMIUM LASER APPLICATION Right 02/11/2019   Procedure: HOLMIUM LASER APPLICATION;  Surgeon: [name], MD;  Location: [facility];  Service: Urology;  Laterality: Right;   IR NEPHROSTOMY PLACEMENT RIGHT  10/16/2022   TONSILLECTOMY  AS CHILD   URETEROSCOPY      Medication: No current facility-administered medications for this encounter.   Current Outpatient Medications  Medication Sig Dispense Refill   acetaminophen (TYLENOL) 500 MG tablet Take 2 tablets (1,000 mg total) by mouth every 8 (eight) hours as needed for mild pain. 30 tablet 0   levonorgestrel (MIRENA) 20 MCG/24HR IUD 1 each by Intrauterine route once. Implanted November 2019     ondansetron (ZOFRAN) 4 MG tablet Take 1 tablet (4 mg total) by mouth every 6 (six) hours. 20 tablet 0   ondansetron (ZOFRAN-ODT) 4 MG disintegrating tablet Take 1 tablet (4 mg total) by mouth every 8 (eight) hours as needed for nausea or vomiting. (Patient not taking: Reported on 01/09/2022) 20 tablet 0   oxyCODONE-acetaminophen (PERCOCET/ROXICET) 5-325 MG tablet Take 1 tablet by mouth every 6 (six) hours as needed for severe pain. 12 tablet 0   senna-docusate (SENOKOT-S) 8.6-50 MG tablet Take 1 tablet by mouth daily. (Patient not taking: Reported on 01/09/2022) 20 tablet 0   tamsulosin (FLOMAX) 0.4 MG CAPS capsule Take 0.4 mg by mouth daily.  zonisamide  (ZONEGRAN) 100 MG capsule Take 100 mg by mouth at bedtime.      Allergies: Allergies  Allergen Reactions   Adhesive [Tape] Rash    Pt states paper tape causes rash, but "plastic tape" is not problematic.     Social History: Social History   Tobacco Use   Smoking status: Never   Smokeless tobacco: Never  Vaping Use   Vaping status: Never Used  Substance Use Topics   Alcohol use: Yes    Comment: occasional   Drug use: No    Family History Family History  Problem Relation Age of Onset   Thyroid disease Mother    Depression Mother    Cancer Father        leaukemia   Thyroid disease Brother    Cancer Maternal Grandmother        GI   Diabetes Maternal Grandfather    Heart disease Paternal Grandmother    Diabetes Maternal Aunt     Review of Systems  Genitourinary:  Positive for flank pain.     Objective   Vital signs in last 24 hours: BP 103/68   Pulse 60   Temp (!) 97.4 F (36.3 C) (Oral)   Resp 17   Ht 5\' 2"  (1.575 m)   Wt 78 kg   SpO2 100%   BMI 31.46 kg/m   Physical Exam General: NAD, A&O, resting, appropriate HEENT: [place]/AT Pulmonary: Normal work of breathing Cardiovascular: RRR, no cyanosis Abdomen: Soft, NTTP, nondistended Neuro: Appropriate, no focal neurological deficits  Most Recent Labs: Lab Results  Component Value Date   WBC 5.2 12/01/2022   HGB 10.0 (L) 12/01/2022   HCT 34.2 (L) 12/01/2022   PLT 150 12/01/2022    Lab Results  Component Value Date   NA 135 12/01/2022   K 3.4 (L) 12/01/2022   CL 113 (H) 12/01/2022   CO2 14 (L) 12/01/2022   BUN 13 12/01/2022   CREATININE 1.11 (H) 12/01/2022   CALCIUM 8.6 (L) 12/01/2022   MG 1.8 03/14/2013    Lab Results  Component Value Date   INR 1.1 10/15/2022     Urine Culture: @[id number] (laburin,org,[id number],[id number])@   IMAGING: CT Renal Stone Study  Result Date: 12/01/2022 CLINICAL DATA:  Abdominal/flank pain, stone suspected. Sudden left flank pain. EXAM: CT ABDOMEN AND PELVIS  WITHOUT CONTRAST TECHNIQUE: Multidetector CT imaging of the abdomen and pelvis was performed following the standard protocol without IV contrast. RADIATION DOSE REDUCTION: This exam was performed according to the departmental dose-optimization program which includes automated exposure control, adjustment of the mA and/or kV according to patient size and/or use of iterative reconstruction technique. COMPARISON:  10/14/2022 FINDINGS: Lower chest: Lung bases are clear. Hepatobiliary: Normal appearance of the liver and gallbladder. Pancreas: Unremarkable. No pancreatic ductal dilatation or surrounding inflammatory changes. Spleen: Normal in size without focal abnormality. Adrenals/Urinary Tract: Normal adrenal glands. Again noted are innumerable small calcifications involving both kidneys. Negative for right hydronephrosis. Numerous stones in the distal left ureter including an 8 mm stone at the left ureterovesical junction. Moderate to severe hydroureteronephrosis. At least 2 stones in the proximal left ureter, largest measures roughly 4 mm. No significant perinephric stranding. Mild stranding along the left side of the pelvis most likely associated with the distal left ureter. Urinary bladder is decompressed. Stomach/Bowel: High-density material in the appendix without inflammatory changes or distension. No evidence for bowel dilatation or obstruction. Normal appearance of the stomach. Vascular/Lymphatic: No significant vascular findings are present. No enlarged abdominal or pelvic  lymph nodes. Reproductive: IUD is present. Otherwise, the uterus and adnexal structures are unremarkable. Other: Stranding in the left hemipelvis associated with the dilated left ureter. No free fluid. Negative for free air. Small umbilical hernia containing fat. Musculoskeletal: No acute bone abnormality. IMPRESSION: 1. Numerous stones in the distal left ureter including a 8 mm stone at the left ureterovesical junction. Moderate to severe  left hydroureteronephrosis. 2. Innumerable small calcifications involving both kidneys. Negative for right hydronephrosis. Electronically Signed   By: Richarda Overlie M.D.   On: 12/01/2022 08:18    ------  Assessment:  47 y.o. female with left ureteral stones and associated hydroureteronephrosis   Recommendations: # Renal tubular acidosis # Ureteral stone, left # Mild AKI To the OR for cystoscopy, left retrograde pyelogram, left laser lithotripsy, and left ureteral stent placement.  Patient is not infected and I do not anticipate a need for her to stay in hospital overnight.  If she can be kept in holding until the OR time is posted that would be ideal.  Case and plan discussed with Dr. Marisue Brooklyn, NP Pager: [phone]   Please contact the urology consult pager with any further questions/concerns.

## 2022-12-01 NOTE — ED Triage Notes (Signed)
Extensive history with kidney stones. Says she has sudden left flank pain manifesting around midnight.   Seen by [facility] last week for an [place] and told many stones present but none are passing yet.   Has tried to push fluids and take meds at home but pain has became unbearable.

## 2022-12-01 NOTE — ED Provider Notes (Signed)
[place] EMERGENCY DEPARTMENT AT [facility] Provider Note   CSN: [id number] Arrival date & time: 12/01/22  4098     History Chief Complaint  Patient presents with   Flank Pain    Phyllis House is a 47 y.o. female with h/o renal tubular acidosis and anemia presents to the ER for evlaution of left flank/left lower quadrant pain since around 0000-0200 today.  Patient reports that she had the same pain on Monday that was resolved.  Given her history of RTA, she has a significant kidney stone burden.  She reports that she was just recently seen by [facility] and was told that she had many stones present in her kidneys.  She reports that she has tried antiemetics, pain medication at home but is concerned that it will worsen or that she will not able to pass this 1 by herself.  Currently, she is reports that her pain is manageable and declines pain medication.  Reports she is feeling some nausea however.  She denies any fevers.  Reports some dysuria but no hematuria.  Denies any diarrhea, constipation.  Reports unchanged urinary urgency/frequency. NKDA.   Flank Pain Associated symptoms include abdominal pain. Pertinent negatives include no chest pain and no shortness of breath.       Home Medications Prior to Admission medications   Medication Sig Start Date End Date Taking? Authorizing Provider  acetaminophen (TYLENOL) 500 MG tablet Take 2 tablets (1,000 mg total) by mouth every 8 (eight) hours as needed for mild pain. 10/17/22   Joseph Art, DO  levonorgestrel (MIRENA) 20 MCG/24HR IUD 1 each by Intrauterine route once. Implanted November 2019    [provider]  ondansetron (ZOFRAN) 4 MG tablet Take 1 tablet (4 mg total) by mouth every 6 (six) hours. 10/14/22   Prosperi, Christian H, PA-C  ondansetron (ZOFRAN-ODT) 4 MG disintegrating tablet Take 1 tablet (4 mg total) by mouth every 8 (eight) hours as needed for nausea or vomiting. Patient not taking:  Reported on 01/09/2022 11/01/21   Long, Arlyss Repress, MD  oxyCODONE-acetaminophen (PERCOCET/ROXICET) 5-325 MG tablet Take 1 tablet by mouth every 6 (six) hours as needed for severe pain. 10/17/22   Joseph Art, DO  senna-docusate (SENOKOT-S) 8.6-50 MG tablet Take 1 tablet by mouth daily. Patient not taking: Reported on 01/09/2022 11/01/21   Long, Arlyss Repress, MD  tamsulosin (FLOMAX) 0.4 MG CAPS capsule Take 0.4 mg by mouth daily.    [provider]  zonisamide (ZONEGRAN) 100 MG capsule Take 100 mg by mouth at bedtime. 10/05/22   [provider]      Allergies    Adhesive [tape]    Review of Systems   Review of Systems  Constitutional:  Negative for chills and fever.  Respiratory:  Negative for shortness of breath.   Cardiovascular:  Negative for chest pain.  Gastrointestinal:  Positive for abdominal pain and nausea. Negative for constipation, diarrhea and vomiting.  Genitourinary:  Positive for dysuria, flank pain, frequency and urgency. Negative for hematuria, vaginal bleeding and vaginal discharge.    Physical Exam Updated Vital Signs BP 103/68   Pulse 60   Temp 97.6 F (36.4 C) (Oral)   Resp 17   Ht 5\' 2"  (1.575 m)   Wt 78 kg   SpO2 100%   BMI 31.46 kg/m  Physical Exam Vitals and nursing note reviewed.  Constitutional:      General: She is not in acute distress.    Appearance: She is not  ill-appearing or toxic-appearing.  Eyes:     General: No scleral icterus. Cardiovascular:     Rate and Rhythm: Normal rate.  Pulmonary:     Effort: Pulmonary effort is normal. No respiratory distress.  Abdominal:     Palpations: Abdomen is soft.     Tenderness: There is abdominal tenderness. There is no right CVA tenderness, left CVA tenderness, guarding or rebound.     Comments: Some mild LLQ/suprapubic tenderness to palpation. Soft. NBS. No CVA tenderness.   Musculoskeletal:     Cervical back: Normal range of motion.  Skin:    General: Skin is warm and dry.  Neurological:      General: No focal deficit present.     Mental Status: She is alert.     Gait: Gait normal.     ED Results / Procedures / Treatments   Labs (all labs ordered are listed, but only abnormal results are displayed) Labs Reviewed  CBC WITH DIFFERENTIAL/PLATELET - Abnormal; Notable for the following components:      Result Value   Hemoglobin 10.0 (*)    HCT 34.2 (*)    MCV 71.4 (*)    MCH 20.9 (*)    MCHC 29.2 (*)    RDW 19.9 (*)    All other components within normal limits  COMPREHENSIVE METABOLIC PANEL - Abnormal; Notable for the following components:   Potassium 3.4 (*)    Chloride 113 (*)    CO2 14 (*)    Glucose, Bld 102 (*)    Creatinine, Ser 1.11 (*)    Calcium 8.6 (*)    All other components within normal limits  URINALYSIS, ROUTINE W REFLEX MICROSCOPIC - Abnormal; Notable for the following components:   APPearance HAZY (*)    Hgb urine dipstick MODERATE (*)    Protein, ur 30 (*)    Leukocytes,Ua MODERATE (*)    All other components within normal limits  URINE CULTURE  PREGNANCY, URINE    EKG None  Radiology CT Renal Stone Study  Result Date: 12/01/2022 CLINICAL DATA:  Abdominal/flank pain, stone suspected. Sudden left flank pain. EXAM: CT ABDOMEN AND PELVIS WITHOUT CONTRAST TECHNIQUE: Multidetector CT imaging of the abdomen and pelvis was performed following the standard protocol without IV contrast. RADIATION DOSE REDUCTION: This exam was performed according to the departmental dose-optimization program which includes automated exposure control, adjustment of the mA and/or kV according to patient size and/or use of iterative reconstruction technique. COMPARISON:  10/14/2022 FINDINGS: Lower chest: Lung bases are clear. Hepatobiliary: Normal appearance of the liver and gallbladder. Pancreas: Unremarkable. No pancreatic ductal dilatation or surrounding inflammatory changes. Spleen: Normal in size without focal abnormality. Adrenals/Urinary Tract: Normal adrenal glands.  Again noted are innumerable small calcifications involving both kidneys. Negative for right hydronephrosis. Numerous stones in the distal left ureter including an 8 mm stone at the left ureterovesical junction. Moderate to severe hydroureteronephrosis. At least 2 stones in the proximal left ureter, largest measures roughly 4 mm. No significant perinephric stranding. Mild stranding along the left side of the pelvis most likely associated with the distal left ureter. Urinary bladder is decompressed. Stomach/Bowel: High-density material in the appendix without inflammatory changes or distension. No evidence for bowel dilatation or obstruction. Normal appearance of the stomach. Vascular/Lymphatic: No significant vascular findings are present. No enlarged abdominal or pelvic lymph nodes. Reproductive: IUD is present. Otherwise, the uterus and adnexal structures are unremarkable. Other: Stranding in the left hemipelvis associated with the dilated left ureter. No free fluid. Negative for  free air. Small umbilical hernia containing fat. Musculoskeletal: No acute bone abnormality. IMPRESSION: 1. Numerous stones in the distal left ureter including a 8 mm stone at the left ureterovesical junction. Moderate to severe left hydroureteronephrosis. 2. Innumerable small calcifications involving both kidneys. Negative for right hydronephrosis. Electronically Signed   By: Richarda Overlie M.D.   On: 12/01/2022 08:18    Procedures Procedures   Medications Ordered in ED Medications  HYDROmorphone (DILAUDID) injection 0.5 mg (has no administration in time range)  metoCLOPramide (REGLAN) injection 10 mg (has no administration in time range)  ondansetron (ZOFRAN) injection 4 mg (4 mg Intravenous Given 12/01/22 0811)  lactated ringers bolus 1,000 mL (1,000 mLs Intravenous New Bag/Given 12/01/22 0810)    ED Course/ Medical Decision Making/ A&P                               Medical Decision Making Amount and/or Complexity of Data  Reviewed Labs: ordered. Radiology: ordered.  Risk Prescription drug management.   47 y.o. female presents to the ER for evaluation of flank pain. Differential diagnosis includes but is not limited to AAA, renal artery/vein embolism/thrombosis, mesenteric ischemia, pyelonephritis, nephrolithiasis, cystitis, biliary colic, pancreatitis, perforated peptic ulcer, appendicitis, diverticulitis, bowel obstruction, Ectopic Pregnancy, PID/TOA, Ovarian cyst, Ovarian torsion. Vital signs unremarkable. Physical exam as noted above.   Patient reports that she thinks her at home oxycodone was helping with the pain and declines any pain medication right now, however she reports that she will take some nausea medication.  Fluids ordered as well.  Will proceed with CT renal scan.  Patient reports this feels the same in comparison to her numerous other kidney stones.  I independently reviewed and interpreted the patient's labs.  CBC shows anemia with hemoglobin of 10 although does not appear to be improved from previous.  No cytosis.  CMP shows mildly decreased potassium 3.4.  Elevated chloride at 113.  Bicarb at 14 however appears to be chronically low.  Normal anion gap.  Glucose slightly elevated at 102.  Creatinine at 1.11 with previous being around 1.  Mild decrease in calcium.  Otherwise no electrolyte or LFT abnormality.  Urinalysis shows hazy urine with mod amount of hemoglobin present.  Is moderate amount of leukocytes however there is no white blood cells or bacteria seen and there is 11-20 squamous epithelial present.  30 protein seen.  Will culture.  CT renal shows  1. Numerous stones in the distal left ureter including a 8 mm stone at the left ureterovesical junction. Moderate to severe left hydroureteronephrosis. 2. Innumerable small calcifications involving both kidneys. Negative for right hydronephrosis.   Given the CT read, I consulted urology and spoke with Elmon Kirschner, NP. He will come and  evaluate the patient.  Plan is for lithotripsy and stenting sometime afternoon. The patient will remain NPO. She reports that she feels some nausea and pain returning. Dilaudid and reglan ordered for symptoms.   On re-evaluation, patient is not complaining of any increased pain or nausea.  Still waiting for OR availability.  I did order another liter of fluid for her.  Urology reports that should be soon for surgery.  Portions of this report may have been transcribed using voice recognition software. Every effort was made to ensure accuracy; however, inadvertent computerized transcription errors may be present.    Final Clinical Impression(s) / ED Diagnoses Final diagnoses:  Multiple kidney stones  Hydronephrosis with urinary obstruction due to  ureteral calculus    Rx / DC Orders ED Discharge Orders     None         Achille Rich, [place] 12/01/22 1547    Linwood Dibbles, MD 12/04/22 [phone]

## 2022-12-02 ENCOUNTER — Encounter (HOSPITAL_COMMUNITY): Payer: Self-pay | Admitting: Urology

## 2022-12-04 ENCOUNTER — Telehealth: Payer: Self-pay | Admitting: Urology

## 2022-12-04 NOTE — Telephone Encounter (Signed)
LVM for patient to call back and sch. Dr Margo Aye - "Patient had stone surgery this evening with stent placed.  It needs to stay in at least 7 days. Schedule for FU with cysto and stent removal 7-14 days."

## 2022-12-08 ENCOUNTER — Ambulatory Visit (INDEPENDENT_AMBULATORY_CARE_PROVIDER_SITE_OTHER): Payer: BC Managed Care – PPO | Admitting: Urology

## 2022-12-08 VITALS — BP 111/69 | HR 76 | Ht 62.0 in | Wt 172.0 lb

## 2022-12-08 DIAGNOSIS — N201 Calculus of ureter: Secondary | ICD-10-CM

## 2022-12-08 DIAGNOSIS — N3001 Acute cystitis with hematuria: Secondary | ICD-10-CM

## 2022-12-08 MED ORDER — SULFAMETHOXAZOLE-TRIMETHOPRIM 800-160 MG PO TABS
1.0000 | ORAL_TABLET | Freq: Two times a day (BID) | ORAL | 0 refills | Status: DC
Start: 2022-12-08 — End: 2023-07-13

## 2022-12-08 NOTE — Progress Notes (Signed)
Assessment: 1. Ureteral calculus, left   2. Acute cystitis with hematuria     Plan: Urine for culture today. Will begin treatment empirically with Bactrim DS twice daily for 7 days. Will hold off on stent removal until early next week.  Chief Complaint: Kidney stones  HPI: Phyllis House is a 47 y.o. female who presents for continued evaluation of nephrolithiasis.  See full consult note 12/01/2022 at the time of recent hospital admission. Patient has a history of renal tubular acidosis and frequent recurrent stone disease.  She is followed in [place] at their metabolic clinic and is on alkali supplementation. Patient presented on 12/01/2018 for with a CT showing multiple stones in the distal ureter stretching for approximately 2 cm distance.  The largest approximately 8 mm.  Patient was taken to the operating room on 12/01/2022 and underwent cystoscopy, ureteroscopy with laser lithotripsy/stone extraction and placement of a left double-J stent.  The patient tolerated the procedure well all significant stones in the ureter were removed at that time.  Patient presents here today for follow-up.   Portions of the above documentation were copied from a prior visit for review purposes only.  Allergies: Allergies  Allergen Reactions   Adhesive [Tape] Rash    Pt states paper tape causes rash, but "plastic tape" is not problematic.     PMH: Past Medical History:  Diagnosis Date   Anxiety    Depression    Eczema    H/O varicella    Hematuria    History of herpes genitalis    History of kidney stones    pt has long hx calcium phosphate stones   Hypocalcemia    Pneumonia    once 10+ yeaers ago   Polycystic ovarian syndrome    PONV (postoperative nausea and vomiting)    Renal tubular acidosis type I    Right ureteral stone    Seasonal allergies    Urgency of urination     PSH: Past Surgical History:  Procedure Laterality Date   CYSTOSCOPY W/ RETROGRADES Right 12/12/2019    Procedure: CYSTOSCOPY WITH RIGHT RETROGRADE PYELOGRAM AND STENT PLACEMENT;  Surgeon: Heloise Purpura, MD;  Location: [facility];  Service: Urology;  Laterality: Right;   CYSTOSCOPY W/ URETERAL STENT PLACEMENT Left 07/01/2012   Procedure: CYSTOSCOPY WITH RETROGRADE PYELOGRAM/URETERAL STENT PLACEMENT;  Surgeon: Milford Cage, MD;  Location: [facility];  Service: Urology;  Laterality: Left;   CYSTOSCOPY W/ URETERAL STENT PLACEMENT Right 07/27/2016   Procedure: CYSTOSCOPY WITH RETROGRADE PYELOGRAM/URETERAL STENT PLACEMENT;  Surgeon: Alfredo Martinez, MD;  Location: [facility];  Service: Urology;  Laterality: Right;   CYSTOSCOPY W/ URETERAL STENT PLACEMENT Right 05/08/2019   Procedure: CYSTOSCOPY WITH RETROGRADE PYELOGRAM/URETERAL STENT PLACEMENT;  Surgeon: Malen Gauze, MD;  Location: [facility];  Service: Urology;  Laterality: Right;   CYSTOSCOPY W/ URETERAL STENT PLACEMENT Right 03/05/2021   Procedure: CYSTOURETEROSCOPY WITH RETROGRADE PYELOGRAM/URETERAL STENT PLACEMENT RIGHT;  Surgeon: Malen Gauze, MD;  Location: [facility];  Service: Urology;  Laterality: Right;   CYSTOSCOPY WITH RETROGRADE PYELOGRAM, URETEROSCOPY AND STENT PLACEMENT Left 07/15/2012   Procedure: CYSTOSCOPY WITH left RETROGRADE PYELOGRAM,left  URETEROSCOPY AND left STENT exchange ;  Surgeon: Milford Cage, MD;  Location: [facility];  Service: Urology;  Laterality: Left;  LEFT URETER STENT EXCHANGE     CYSTOSCOPY WITH RETROGRADE PYELOGRAM, URETEROSCOPY AND STENT PLACEMENT Right 08/11/2016   Procedure: CYSTOSCOPY WITH RIGHT RETROGRADE PYELOGRAM,RIGHT  URETEROSCOPY AND STENT CHANGE;  Surgeon: Ihor Gully, MD;  Location:  [facility];  Service: Urology;  Laterality: Right;   CYSTOSCOPY WITH URETEROSCOPY AND STENT PLACEMENT Right 07/12/2021   Procedure: CYSTOSCOPY WITH RIGHT URETEROSCOPY AND STENT PLACEMENT;  Surgeon: [name], MD;  Location: [facility];  Service: Urology;  Laterality:  Right;   CYSTOSCOPY/RETROGRADE/URETEROSCOPY/STONE EXTRACTION WITH BASKET  AGE 37   CYSTOSCOPY/URETEROSCOPY/HOLMIUM LASER/STENT PLACEMENT Bilateral 09/19/2018   Procedure: CYSTOSCOPY/RETROGRADE/URETEROSCOPY/HOLMIUM LASER/STENT PLACEMENT;  Surgeon: [name], MD;  Location: [facility];  Service: Urology;  Laterality: Bilateral;   CYSTOSCOPY/URETEROSCOPY/HOLMIUM LASER/STENT PLACEMENT Right 10/22/2018   Procedure: CYSTOSCOPY/RETROGRADE/URETEROSCOPY/HOLMIUM LASER/STENT PLACEMENT;  Surgeon: [name], MD;  Location: [facility];  Service: Urology;  Laterality: Right;  ONLY NEEDS 60 MIN   CYSTOSCOPY/URETEROSCOPY/HOLMIUM LASER/STENT PLACEMENT Right 02/11/2019   Procedure: CYSTOSCOPY/RETROGRADE/URETEROSCOPY/HOLMIUM LASER/STENT PLACEMENT;  Surgeon: [name], MD;  Location: [facility];  Service: Urology;  Laterality: Right;   CYSTOSCOPY/URETEROSCOPY/HOLMIUM LASER/STENT PLACEMENT Bilateral 06/03/2019   Procedure: CYSTOSCOPY/RETROGRADE/URETEROSCOPY/HOLMIUM LASER/STENT PLACEMENT;  Surgeon: [name], MD;  Location: [facility];  Service: Urology;  Laterality: Bilateral;   CYSTOSCOPY/URETEROSCOPY/HOLMIUM LASER/STENT PLACEMENT Right 12/30/2019   Procedure: CYSTOSCOPY, RETROGRADE /URETEROSCOPY/HOLMIUM LASER/STENT EXCHANGE;  Surgeon: [name], MD;  Location: [facility];  Service: Urology;  Laterality: Right;   CYSTOSCOPY/URETEROSCOPY/HOLMIUM LASER/STENT PLACEMENT Right 10/17/2022   Procedure: CYSTOSCOPY/URETEROSCOPY/HOLMIUM LASER/STENT PLACEMENT/RIGHT NEPHROSTOMY TUBE REMOVAL;  Surgeon: [name], MD;  Location: [facility];  Service: Urology;  Laterality: Right;   CYSTOSCOPY/URETEROSCOPY/HOLMIUM LASER/STENT PLACEMENT Left 12/01/2022   Procedure: CYSTOSCOPY/URETEROSCOPY/HOLMIUM LASER/STENT PLACEMENT;  Surgeon: Joline Maxcy, MD;  Location: [facility];  Service: Urology;  Laterality: Left;   EXTRACORPOREAL SHOCK WAVE  LITHOTRIPSY Left 08/07/2019   Procedure: EXTRACORPOREAL SHOCK WAVE LITHOTRIPSY (ESWL);  Surgeon: Noel Christmas, MD;  Location: [facility];  Service: Urology;  Laterality: Left;   EXTRACORPOREAL SHOCK WAVE LITHOTRIPSY Left 04/08/2020   Procedure: EXTRACORPOREAL SHOCK WAVE LITHOTRIPSY (ESWL);  Surgeon: Belva Agee, MD;  Location: [facility];  Service: Urology;  Laterality: Left;   EXTRACORPOREAL SHOCK WAVE LITHOTRIPSY Right 12/16/2020   Procedure: EXTRACORPOREAL SHOCK WAVE LITHOTRIPSY (ESWL);  Surgeon: Alfredo Martinez, MD;  Location: [facility];  Service: Urology;  Laterality: Right;   HOLMIUM LASER APPLICATION Left 07/15/2012   Procedure: HOLMIUM LASER APPLICATION;  Surgeon: Milford Cage, MD;  Location: [facility];  Service: Urology;  Laterality: Left;   HOLMIUM LASER APPLICATION Right 08/11/2016   Procedure: HOLMIUM LASER APPLICATION;  Surgeon: Ihor Gully, MD;  Location: [facility];  Service: Urology;  Laterality: Right;   HOLMIUM LASER APPLICATION Right 02/11/2019   Procedure: HOLMIUM LASER APPLICATION;  Surgeon: [name], MD;  Location: [facility];  Service: Urology;  Laterality: Right;   IR NEPHROSTOMY PLACEMENT RIGHT  10/16/2022   TONSILLECTOMY  AS CHILD   URETEROSCOPY      SH: Social History   Tobacco Use   Smoking status: Never   Smokeless tobacco: Never  Vaping Use   Vaping status: Never Used  Substance Use Topics   Alcohol use: Yes    Comment: occasional   Drug use: No    ROS: Constitutional:  Negative for fever, chills, weight loss CV: Negative for chest pain, previous MI, hypertension Respiratory:  Negative for shortness of breath, wheezing, sleep apnea, frequent cough GI:  Negative for nausea, vomiting, bloody stool, GERD  PE: BP 111/69   Pulse 76   Ht 5\' 2"  (1.575 m)   Wt 172 lb (78 kg)   BMI 31.46 kg/m  GENERAL  APPEARANCE:  Well  appearing, well developed, well nourished, NAD    Results: Urinalysis shows significant pyuria, microscopic hematuria and moderate bacteria

## 2022-12-11 ENCOUNTER — Telehealth: Payer: Self-pay | Admitting: Urology

## 2022-12-11 MED ORDER — OXYCODONE HCL 5 MG PO TABS: 5.0000 mg | ORAL_TABLET | Freq: Four times a day (QID) | ORAL | 0 refills | Status: DC | PRN

## 2022-12-11 NOTE — Telephone Encounter (Signed)
Pt aware that pain meds were sent to the pharmacy. She is also aware that she needs to have a KUB prior to coming upstairs for her appt tomorrow morning.

## 2022-12-11 NOTE — Telephone Encounter (Signed)
Right kidney pain. Has appt tomorrow but wants meds for the pain today. Would like a call back as well.

## 2022-12-11 NOTE — Addendum Note (Signed)
Addended by: Joline Maxcy on: 12/11/2022 12:52 PM   Modules accepted: Orders

## 2022-12-12 ENCOUNTER — Other Ambulatory Visit: Payer: Self-pay

## 2022-12-12 ENCOUNTER — Emergency Department (HOSPITAL_COMMUNITY)
Admission: EM | Admit: 2022-12-12 | Discharge: 2022-12-12 | Disposition: A | Discharge status: Home / Self Care | Payer: BC Managed Care – PPO | Attending: Emergency Medicine | Admitting: Emergency Medicine

## 2022-12-12 ENCOUNTER — Emergency Department (HOSPITAL_COMMUNITY): Payer: BC Managed Care – PPO

## 2022-12-12 ENCOUNTER — Ambulatory Visit (INDEPENDENT_AMBULATORY_CARE_PROVIDER_SITE_OTHER): Payer: BC Managed Care – PPO | Admitting: Urology

## 2022-12-12 ENCOUNTER — Ambulatory Visit (HOSPITAL_BASED_OUTPATIENT_CLINIC_OR_DEPARTMENT_OTHER): Admission: RE | Admit: 2022-12-12 | Payer: BC Managed Care – PPO | Source: Ambulatory Visit

## 2022-12-12 VITALS — BP 118/66 | HR 102 | Temp 97.7°F

## 2022-12-12 DIAGNOSIS — R109 Unspecified abdominal pain: Secondary | ICD-10-CM | POA: Diagnosis present

## 2022-12-12 DIAGNOSIS — N202 Calculus of kidney with calculus of ureter: Secondary | ICD-10-CM | POA: Insufficient documentation

## 2022-12-12 DIAGNOSIS — N201 Calculus of ureter: Secondary | ICD-10-CM

## 2022-12-12 DIAGNOSIS — N200 Calculus of kidney: Secondary | ICD-10-CM

## 2022-12-12 LAB — BASIC METABOLIC PANEL
Anion gap: 6 (ref 5–15)
BUN: 13 mg/dL (ref 6–20)
CO2: 14 mmol/L — ABNORMAL LOW (ref 22–32)
Calcium: 8.8 mg/dL — ABNORMAL LOW (ref 8.9–10.3)
Chloride: 114 mmol/L — ABNORMAL HIGH (ref 98–111)
Creatinine, Ser: 1.18 mg/dL — ABNORMAL HIGH (ref 0.44–1.00)
GFR, Estimated: 57 mL/min — ABNORMAL LOW (ref >60)
Glucose, Bld: 98 mg/dL (ref 70–99)
Potassium: 3.5 mmol/L (ref 3.5–5.1)
Sodium: 134 mmol/L — ABNORMAL LOW (ref 135–145)

## 2022-12-12 LAB — CBC WITH DIFFERENTIAL/PLATELET
Abs Immature Granulocytes: 0.01 10*3/uL (ref 0.00–0.07)
Basophils Absolute: 0 10*3/uL (ref 0.0–0.1)
Basophils Relative: 1 %
Eosinophils Absolute: 0.2 10*3/uL (ref 0.0–0.5)
Eosinophils Relative: 6 %
HCT: 38.5 % (ref 36.0–46.0)
Hemoglobin: 11.7 g/dL — ABNORMAL LOW (ref 12.0–15.0)
Immature Granulocytes: 0 %
Lymphocytes Relative: 15 %
Lymphs Abs: 0.6 10*3/uL — ABNORMAL LOW (ref 0.7–4.0)
MCH: 21.4 pg — ABNORMAL LOW (ref 26.0–34.0)
MCHC: 30.4 g/dL (ref 30.0–36.0)
MCV: 70.5 fL — ABNORMAL LOW (ref 80.0–100.0)
Monocytes Absolute: 0.5 10*3/uL (ref 0.1–1.0)
Monocytes Relative: 12 %
Neutro Abs: 2.5 10*3/uL (ref 1.7–7.7)
Neutrophils Relative %: 66 %
Platelets: 172 10*3/uL (ref 150–400)
RBC: 5.46 MIL/uL — ABNORMAL HIGH (ref 3.87–5.11)
RDW: 19.6 % — ABNORMAL HIGH (ref 11.5–15.5)
WBC: 3.8 10*3/uL — ABNORMAL LOW (ref 4.0–10.5)
nRBC: 0 % (ref 0.0–0.2)

## 2022-12-12 LAB — PREGNANCY, URINE: Preg Test, Ur: NEGATIVE

## 2022-12-12 LAB — URINALYSIS, W/ REFLEX TO CULTURE (INFECTION SUSPECTED)
Bilirubin Urine: NEGATIVE
Glucose, UA: NEGATIVE mg/dL
Ketones, ur: NEGATIVE mg/dL
Nitrite: NEGATIVE
Protein, ur: 100 mg/dL — AB
RBC / HPF: >50 RBC/hpf (ref 0–5)
Specific Gravity, Urine: 1.014 (ref 1.005–1.030)
WBC, UA: >50 WBC/hpf (ref 0–5)
pH: 7 (ref 5.0–8.0)

## 2022-12-12 MED ORDER — ONDANSETRON HCL 4 MG PO TABS: 4.0000 mg | ORAL_TABLET | Freq: Four times a day (QID) | ORAL | 0 refills | Status: DC

## 2022-12-12 MED ORDER — ONDANSETRON HCL 4 MG/2ML IJ SOLN
4.0000 mg | Freq: Once | INTRAMUSCULAR | Status: AC
  Administered 2022-12-12: 4 mg via INTRAVENOUS
  Filled 2022-12-12: qty 2

## 2022-12-12 MED ORDER — CEFUROXIME AXETIL 250 MG PO TABS: 250.0000 mg | ORAL_TABLET | Freq: Two times a day (BID) | ORAL | 0 refills | Status: AC

## 2022-12-12 MED ORDER — MORPHINE SULFATE (PF) 4 MG/ML IV SOLN
4.0000 mg | Freq: Once | INTRAVENOUS | Status: AC
  Administered 2022-12-12: 4 mg via INTRAVENOUS
  Filled 2022-12-12: qty 1

## 2022-12-12 MED ORDER — KETOROLAC TROMETHAMINE 30 MG/ML IJ SOLN
30.0000 mg | Freq: Once | INTRAMUSCULAR | Status: AC
  Administered 2022-12-12: 30 mg via INTRAVENOUS
  Filled 2022-12-12: qty 1

## 2022-12-12 NOTE — ED Provider Notes (Signed)
[place] EMERGENCY DEPARTMENT AT [facility] Provider Note   CSN: [id number] Arrival date & time: 12/12/22  4098     History  Chief Complaint  Patient presents with   Emesis   Flank Pain    Phyllis House is a 47 y.o. female.  This is a 47 year old female here today for right-sided flank pain.  Patient has history of "100s of kidney stones".  She was at her urology office today for removal of the stent on her left side.  He recommended she come to the emergency department as there are imaging there could not assess stones.  She denies any fever.   Emesis Flank Pain       Home Medications Prior to Admission medications   Medication Sig Start Date End Date Taking? Authorizing Provider  cefUROXime (CEFTIN) 250 MG tablet Take 1 tablet (250 mg total) by mouth 2 (two) times daily with a meal for 5 days. 12/12/22 12/17/22 Yes Anders Simmonds T, DO  ondansetron (ZOFRAN) 4 MG tablet Take 1 tablet (4 mg total) by mouth every 6 (six) hours. 12/12/22  Yes Anders Simmonds T, DO  acetaminophen (TYLENOL) 500 MG tablet Take 2 tablets (1,000 mg total) by mouth every 8 (eight) hours as needed for mild pain. 10/17/22    Art, DO  levonorgestrel (MIRENA) 20 MCG/24HR IUD 1 each by Intrauterine route once. Implanted November 2019    [provider]  ondansetron (ZOFRAN) 4 MG tablet Take 1 tablet (4 mg total) by mouth every 6 (six) hours. 10/14/22   Prosperi, Christian H, PA-C  ondansetron (ZOFRAN-ODT) 4 MG disintegrating tablet Take 1 tablet (4 mg total) by mouth every 8 (eight) hours as needed for nausea or vomiting. 11/01/21   Long, Arlyss Repress, MD  oxyCODONE (ROXICODONE) 5 MG immediate release tablet Take 1 tablet (5 mg total) by mouth every 6 (six) hours as needed for severe pain. 12/11/22   Joline Maxcy, MD  oxyCODONE-acetaminophen (PERCOCET/ROXICET) 5-325 MG tablet Take 1 tablet by mouth every 6 (six) hours as needed for severe pain. 10/17/22    Art, DO   senna-docusate (SENOKOT-S) 8.6-50 MG tablet Take 1 tablet by mouth daily. 11/01/21   Long, Arlyss Repress, MD  sulfamethoxazole-trimethoprim (BACTRIM DS) 800-160 MG tablet Take 1 tablet by mouth every 12 (twelve) hours. 12/08/22   Joline Maxcy, MD  tamsulosin (FLOMAX) 0.4 MG CAPS capsule Take 0.4 mg by mouth daily.    [provider]  zonisamide (ZONEGRAN) 100 MG capsule Take 100 mg by mouth at bedtime. 10/05/22   [provider]      Allergies    Adhesive [tape]    Review of Systems   Review of Systems  Gastrointestinal:  Positive for vomiting.  Genitourinary:  Positive for flank pain.    Physical Exam Updated Vital Signs BP 135/88 (BP Location: Left Arm)   Pulse 98   Temp 98.4 F (36.9 C) (Oral)   Resp 16   Ht 5\' 2"  (1.575 m)   Wt 78 kg   SpO2 100%   BMI 31.46 kg/m  Physical Exam Vitals reviewed.  Cardiovascular:     Rate and Rhythm: Normal rate.  Abdominal:     Palpations: Abdomen is soft.     Tenderness: There is right CVA tenderness.  Neurological:     Mental Status: She is alert.     ED Results / Procedures / Treatments   Labs (all labs ordered are listed, but only abnormal results are displayed)  Labs Reviewed  CBC WITH DIFFERENTIAL/PLATELET - Abnormal; Notable for the following components:      Result Value   WBC 3.8 (*)    RBC 5.46 (*)    Hemoglobin 11.7 (*)    MCV 70.5 (*)    MCH 21.4 (*)    RDW 19.6 (*)    Lymphs Abs 0.6 (*)    All other components within normal limits  BASIC METABOLIC PANEL - Abnormal; Notable for the following components:   Sodium 134 (*)    Chloride 114 (*)    CO2 14 (*)    Creatinine, Ser 1.18 (*)    Calcium 8.8 (*)    GFR, Estimated 57 (*)    All other components within normal limits  URINALYSIS, W/ REFLEX TO CULTURE (INFECTION SUSPECTED) - Abnormal; Notable for the following components:   APPearance HAZY (*)    Hgb urine dipstick LARGE (*)    Protein, ur 100 (*)    Leukocytes,Ua LARGE (*)    Bacteria, UA  RARE (*)    All other components within normal limits  PREGNANCY, URINE    EKG None  Radiology Abdomen 1 view (KUB)  Result Date: 12/12/2022 CLINICAL DATA:  Nephrolithiasis, right-sided abdominal pain EXAM: ABDOMEN - 1 VIEW COMPARISON:  CT scan 12/12/2022 FINDINGS: Bilateral punctate renal calculi. Clustered small calculi are present adjacent to the distal intraureteral portion of the left ureteral stent. IUD noted. Unremarkable bowel gas pattern. Degenerative facet arthropathy at L5-S1.  Transitional S1 vertebra. IMPRESSION: 1. Clustered small calculi adjacent to the distal intraureteral portion of the left ureteral stent. 2. Bilateral punctate renal calculi. 3. Degenerative facet arthropathy at L5-S1, mildly transitional S1 vertebra. Electronically Signed   By: Gaylyn Rong M.D.   On: 12/12/2022 12:37   CT Renal Stone Study  Result Date: 12/12/2022 CLINICAL DATA:  Right flank pain starting Thursday. Left ureteral stent in place. Urinary tract infection, day 4 Bactrim therapy EXAM: CT ABDOMEN AND PELVIS WITHOUT CONTRAST TECHNIQUE: Multidetector CT imaging of the abdomen and pelvis was performed following the standard protocol without IV contrast. RADIATION DOSE REDUCTION: This exam was performed according to the departmental dose-optimization program which includes automated exposure control, adjustment of the mA and/or kV according to patient size and/or use of iterative reconstruction technique. COMPARISON:  12/01/2022 FINDINGS: Lower chest: Unremarkable Hepatobiliary: Unremarkable Pancreas: Unremarkable Spleen: Unremarkable Adrenals/Urinary Tract: Adrenal glands appear normal. Numerous punctate bilateral nonobstructive renal calculi. 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 In the left distal ureter alongside the stent, there is a 0.8 cm in length linear cluster of calculi individually measuring in the 1-3 mm diameter range. These are shown for example on image 51 series 8. The  previous larger ureteral calculi and previous UPJ calculus are absent. Stomach/Bowel: Unremarkable Vascular/Lymphatic: Unremarkable Reproductive: IUD appears satisfactorily positioned along the projected course of the endometrium. Other: No supplemental non-categorized findings. Musculoskeletal: Unremarkable IMPRESSION: 1. Numerous punctate bilateral nonobstructive renal calculi. 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 2. In the left distal ureter alongside the stent, there is a 0.8 cm in length linear cluster of calculi individually measuring in the 1-3 mm diameter range. The previous larger ureteral calculi and previous UPJ calculus are absent. 3. IUD appears satisfactorily positioned along the projected course of the endometrium. Electronically Signed   By: Gaylyn Rong M.D.   On: 12/12/2022 12:34    Procedures Procedures    Medications Ordered in ED Medications  morphine (PF) 4 MG/ML injection 4 mg (4 mg Intravenous Given 12/12/22 1055)  ondansetron (ZOFRAN) injection  4 mg (4 mg Intravenous Given 12/12/22 1054)  ketorolac (TORADOL) 30 MG/ML injection 30 mg (30 mg Intravenous Given 12/12/22 1054)    ED Course/ Medical Decision Making/ A&P                                 Medical Decision Making 47 year old female here today for right sided flank pain.  Differential diagnoses include nephrolithiasis, less likely pyelonephritis, less likely aortic pathology.  Plan-patient's history is most consistent with nephrolithiasis.  Will obtain imaging.  Urinalysis ordered.  Reassessment-patient feeling quite a bit better.  Imaging shows known stone in left ureter.  Multiple nonobstructive nephrolithiasis.  Patient does have large amount of leukocytes in urine, labs otherwise normal.  She will continue to follow-up outpatient.  Discussed return precautions with patient at bedside.  Amount and/or Complexity of Data Reviewed Labs: ordered. Radiology: ordered.  Risk Prescription drug  management.           Final Clinical Impression(s) / ED Diagnoses Final diagnoses:  Nephrolithiasis    Rx / DC Orders ED Discharge Orders          Ordered    ondansetron (ZOFRAN) 4 MG tablet  Every 6 hours        12/12/22 1309    cefUROXime (CEFTIN) 250 MG tablet  2 times daily with meals        12/12/22 1309              Anders Simmonds T, DO 12/12/22 1310

## 2022-12-12 NOTE — ED Triage Notes (Signed)
Pt reports rt flank pain since Thursday with extensive hx of stones. Has lt ureter stent in now. Pt reports N/V, UTI on day 4 of bactrim.

## 2022-12-12 NOTE — Discharge Instructions (Signed)
You can take Ceftin 2 times per day for the next 5 days.  Please follow-up with your urologist.  You can take 400 mg of ibuprofen every 6 hours, 1000 mg of Tylenol every 8 hours.  I have sent you with Zofran.

## 2022-12-12 NOTE — Progress Notes (Signed)
Assessment: 1. Ureteral calculus, left   2. Right flank pain     Plan: Will defer stent removal at this time. Patient is going to go to the emergency department for further evaluation of her right-sided flank pain with nausea and vomiting.  Chief Complaint: No chief complaint on file.   HPI: Phyllis House is a 47 y.o. female who presents for continued evaluation of nephrolithiasis. Patient is status post cystoscopy with left ureteroscopy and laser lithotripsy for a large volume of left ureteral calculi on 12/01/2022.  The patient is scheduled today for left-sided double-J stent removal.  Unfortunately, the patient has been having worsening right-sided renal colicky type pain over the last few days.  She is now having a lot of nausea and vomiting.  I told her at this time I think she is can need to go to the emergency room for further evaluation, hydration/pain control and repeat imaging on the right.  KUB today does not clearly show a right sided ureteral calculus.  Left double-J stent is in good position.  Portions of the above documentation were copied from a prior visit for review purposes only.  Allergies: Allergies  Allergen Reactions   Adhesive [Tape] Rash    Pt states paper tape causes rash, but "plastic tape" is not problematic.     PMH: Past Medical History:  Diagnosis Date   Anxiety    Depression    Eczema    H/O varicella    Hematuria    History of herpes genitalis    History of kidney stones    pt has long hx calcium phosphate stones   Hypocalcemia    Pneumonia    once 10+ yeaers ago   Polycystic ovarian syndrome    PONV (postoperative nausea and vomiting)    Renal tubular acidosis type I    Right ureteral stone    Seasonal allergies    Urgency of urination     PSH: Past Surgical History:  Procedure Laterality Date   CYSTOSCOPY W/ RETROGRADES Right 12/12/2019   Procedure: CYSTOSCOPY WITH RIGHT RETROGRADE PYELOGRAM AND STENT PLACEMENT;  Surgeon:  Heloise Purpura, MD;  Location: [facility];  Service: Urology;  Laterality: Right;   CYSTOSCOPY W/ URETERAL STENT PLACEMENT Left 07/01/2012   Procedure: CYSTOSCOPY WITH RETROGRADE PYELOGRAM/URETERAL STENT PLACEMENT;  Surgeon: Milford Cage, MD;  Location: [facility];  Service: Urology;  Laterality: Left;   CYSTOSCOPY W/ URETERAL STENT PLACEMENT Right 07/27/2016   Procedure: CYSTOSCOPY WITH RETROGRADE PYELOGRAM/URETERAL STENT PLACEMENT;  Surgeon: Alfredo Martinez, MD;  Location: [facility];  Service: Urology;  Laterality: Right;   CYSTOSCOPY W/ URETERAL STENT PLACEMENT Right 05/08/2019   Procedure: CYSTOSCOPY WITH RETROGRADE PYELOGRAM/URETERAL STENT PLACEMENT;  Surgeon: Malen Gauze, MD;  Location: [facility];  Service: Urology;  Laterality: Right;   CYSTOSCOPY W/ URETERAL STENT PLACEMENT Right 03/05/2021   Procedure: CYSTOURETEROSCOPY WITH RETROGRADE PYELOGRAM/URETERAL STENT PLACEMENT RIGHT;  Surgeon: Malen Gauze, MD;  Location: [facility];  Service: Urology;  Laterality: Right;   CYSTOSCOPY WITH RETROGRADE PYELOGRAM, URETEROSCOPY AND STENT PLACEMENT Left 07/15/2012   Procedure: CYSTOSCOPY WITH left RETROGRADE PYELOGRAM,left  URETEROSCOPY AND left STENT exchange ;  Surgeon: Milford Cage, MD;  Location: [facility];  Service: Urology;  Laterality: Left;  LEFT URETER STENT EXCHANGE     CYSTOSCOPY WITH RETROGRADE PYELOGRAM, URETEROSCOPY AND STENT PLACEMENT Right 08/11/2016   Procedure: CYSTOSCOPY WITH RIGHT RETROGRADE PYELOGRAM,RIGHT  URETEROSCOPY AND STENT CHANGE;  Surgeon: Ihor Gully, MD;  Location: [facility];  Service: Urology;  Laterality: Right;   CYSTOSCOPY WITH URETEROSCOPY AND STENT PLACEMENT Right 07/12/2021   Procedure: CYSTOSCOPY WITH RIGHT URETEROSCOPY AND STENT PLACEMENT;  Surgeon: [name], MD;  Location: [facility];  Service: Urology;  Laterality: Right;   CYSTOSCOPY/RETROGRADE/URETEROSCOPY/STONE EXTRACTION WITH BASKET  AGE 89    CYSTOSCOPY/URETEROSCOPY/HOLMIUM LASER/STENT PLACEMENT Bilateral 09/19/2018   Procedure: CYSTOSCOPY/RETROGRADE/URETEROSCOPY/HOLMIUM LASER/STENT PLACEMENT;  Surgeon: [name], MD;  Location: [facility];  Service: Urology;  Laterality: Bilateral;   CYSTOSCOPY/URETEROSCOPY/HOLMIUM LASER/STENT PLACEMENT Right 10/22/2018   Procedure: CYSTOSCOPY/RETROGRADE/URETEROSCOPY/HOLMIUM LASER/STENT PLACEMENT;  Surgeon: [name], MD;  Location: [facility];  Service: Urology;  Laterality: Right;  ONLY NEEDS 60 MIN   CYSTOSCOPY/URETEROSCOPY/HOLMIUM LASER/STENT PLACEMENT Right 02/11/2019   Procedure: CYSTOSCOPY/RETROGRADE/URETEROSCOPY/HOLMIUM LASER/STENT PLACEMENT;  Surgeon: [name], MD;  Location: [facility];  Service: Urology;  Laterality: Right;   CYSTOSCOPY/URETEROSCOPY/HOLMIUM LASER/STENT PLACEMENT Bilateral 06/03/2019   Procedure: CYSTOSCOPY/RETROGRADE/URETEROSCOPY/HOLMIUM LASER/STENT PLACEMENT;  Surgeon: [name], MD;  Location: [facility];  Service: Urology;  Laterality: Bilateral;   CYSTOSCOPY/URETEROSCOPY/HOLMIUM LASER/STENT PLACEMENT Right 12/30/2019   Procedure: CYSTOSCOPY, RETROGRADE /URETEROSCOPY/HOLMIUM LASER/STENT EXCHANGE;  Surgeon: [name], MD;  Location: [facility];  Service: Urology;  Laterality: Right;   CYSTOSCOPY/URETEROSCOPY/HOLMIUM LASER/STENT PLACEMENT Right 10/17/2022   Procedure: CYSTOSCOPY/URETEROSCOPY/HOLMIUM LASER/STENT PLACEMENT/RIGHT NEPHROSTOMY TUBE REMOVAL;  Surgeon: [name], MD;  Location: [facility];  Service: Urology;  Laterality: Right;   CYSTOSCOPY/URETEROSCOPY/HOLMIUM LASER/STENT PLACEMENT Left 12/01/2022   Procedure: CYSTOSCOPY/URETEROSCOPY/HOLMIUM LASER/STENT PLACEMENT;  Surgeon: Joline Maxcy, MD;  Location: [facility];  Service: Urology;  Laterality: Left;   EXTRACORPOREAL SHOCK WAVE LITHOTRIPSY Left 08/07/2019   Procedure: EXTRACORPOREAL SHOCK WAVE LITHOTRIPSY (ESWL);   Surgeon: Noel Christmas, MD;  Location: [facility];  Service: Urology;  Laterality: Left;   EXTRACORPOREAL SHOCK WAVE LITHOTRIPSY Left 04/08/2020   Procedure: EXTRACORPOREAL SHOCK WAVE LITHOTRIPSY (ESWL);  Surgeon: Belva Agee, MD;  Location: [facility];  Service: Urology;  Laterality: Left;   EXTRACORPOREAL SHOCK WAVE LITHOTRIPSY Right 12/16/2020   Procedure: EXTRACORPOREAL SHOCK WAVE LITHOTRIPSY (ESWL);  Surgeon: Alfredo Martinez, MD;  Location: [facility];  Service: Urology;  Laterality: Right;   HOLMIUM LASER APPLICATION Left 07/15/2012   Procedure: HOLMIUM LASER APPLICATION;  Surgeon: Milford Cage, MD;  Location[facility];  Service: Urology;  Laterality: Left;   HOLMIUM LASER APPLICATION Right 08/11/2016   Procedure: HOLMIUM LASER APPLICATION;  Surgeon: Ihor Gully, MD;  Location: [facility];  Service: Urology;  Laterality: Right;   HOLMIUM LASER APPLICATION Right 02/11/2019   Procedure: HOLMIUM LASER APPLICATION;  Surgeon: [name], MD;  Location: [facility];  Service: Urology;  Laterality: Right;   IR NEPHROSTOMY PLACEMENT RIGHT  10/16/2022   TONSILLECTOMY  AS CHILD   URETEROSCOPY      SH: Social History   Tobacco Use   Smoking status: Never   Smokeless tobacco: Never  Vaping Use   Vaping status: Never Used  Substance Use Topics   Alcohol use: Yes    Comment: occasional   Drug use: No    ROS: Constitutional:  Negative for fever, chills, weight loss CV: Negative for chest pain, previous MI, hypertension Respiratory:  Negative for shortness of breath, wheezing, sleep apnea, frequent cough GI:  Negative for nausea, vomiting, bloody stool, GERD  PE: BP 118/66   Pulse (!) 102   Temp 97.7 F (36.5 C) (Oral)  GENERAL APPEARANCE:  Well appearing, well developed, well nourished, NAD

## 2022-12-13 ENCOUNTER — Telehealth: Payer: Self-pay | Admitting: Urology

## 2022-12-13 ENCOUNTER — Telehealth: Payer: Self-pay

## 2022-12-13 NOTE — Telephone Encounter (Signed)
Left msg for patient to call back to the office to verify that she got the message about her appt with Dr. Margo Aye at [phone] tomorrow to have the stent removed.

## 2022-12-13 NOTE — Telephone Encounter (Signed)
Was seen yesterday in office. today pt is having heavy clotts of blood in urine. was seen at [facility] as well. heavy nausea, pain not as bad. Wants to know what she should do

## 2022-12-13 NOTE — Telephone Encounter (Signed)
-----   Message from Phyllis House sent at 12/13/2022 10:24 AM EDT ----- Regarding: stent removal She was seen in the ED yesterday for her right sided pain and did not have an obstructing stone there.  I need to get her back in and get her left-sided stent out.  Please set up tomorrow

## 2022-12-14 ENCOUNTER — Ambulatory Visit (INDEPENDENT_AMBULATORY_CARE_PROVIDER_SITE_OTHER): Payer: BC Managed Care – PPO | Admitting: Urology

## 2022-12-14 VITALS — BP 120/83 | HR 83

## 2022-12-14 DIAGNOSIS — Z466 Encounter for fitting and adjustment of urinary device: Secondary | ICD-10-CM

## 2022-12-14 DIAGNOSIS — N201 Calculus of ureter: Secondary | ICD-10-CM | POA: Diagnosis not present

## 2022-12-14 NOTE — Progress Notes (Signed)
Assessment: 1. Ureteral calculus, left     Plan: Left dj stent removed  Patient will continue routine FU-- due to complete 24 hr urine studies for stone clinic at [facility].  Chief Complaint: Here today for stent removal.  HPI: Phyllis House is a 47 y.o. female who presents for continued evaluation of nephrolithiasis. Patient has quite significant recurrent metabolic stone disease.  She is most recently status post left USM/laser/stent for large left distal stone burden on 12/01/22. Patient was here 2 days ago for planned stent removal however at that time she was having right-sided renal colic and nausea and vomiting.  She subsequently presented to the emergency department underwent CT stone study which showed no obstructing stones on the right side.  She was started on ceftin.  Culture neg. She was noted to have several 1 to 3 mm stones and a linear cluster along the left distal ureteral stent.  Patient is here today again for stent removal.   Portions of the above documentation were copied from a prior visit for review purposes only.  Allergies: Allergies  Allergen Reactions   Adhesive [Tape] Rash    Pt states paper tape causes rash, but "plastic tape" is not problematic.     PMH: Past Medical History:  Diagnosis Date   Anxiety    Depression    Eczema    H/O varicella    Hematuria    History of herpes genitalis    History of kidney stones    pt has long hx calcium phosphate stones   Hypocalcemia    Pneumonia    once 10+ yeaers ago   Polycystic ovarian syndrome    PONV (postoperative nausea and vomiting)    Renal tubular acidosis type I    Right ureteral stone    Seasonal allergies    Urgency of urination     PSH: Past Surgical History:  Procedure Laterality Date   CYSTOSCOPY W/ RETROGRADES Right 12/12/2019   Procedure: CYSTOSCOPY WITH RIGHT RETROGRADE PYELOGRAM AND STENT PLACEMENT;  Surgeon: Heloise Purpura, MD;  Location: [facility];  Service: Urology;   Laterality: Right;   CYSTOSCOPY W/ URETERAL STENT PLACEMENT Left 07/01/2012   Procedure: CYSTOSCOPY WITH RETROGRADE PYELOGRAM/URETERAL STENT PLACEMENT;  Surgeon: Milford Cage, MD;  Location: [facility];  Service: Urology;  Laterality: Left;   CYSTOSCOPY W/ URETERAL STENT PLACEMENT Right 07/27/2016   Procedure: CYSTOSCOPY WITH RETROGRADE PYELOGRAM/URETERAL STENT PLACEMENT;  Surgeon: Alfredo Martinez, MD;  Location: [facility];  Service: Urology;  Laterality: Right;   CYSTOSCOPY W/ URETERAL STENT PLACEMENT Right 05/08/2019   Procedure: CYSTOSCOPY WITH RETROGRADE PYELOGRAM/URETERAL STENT PLACEMENT;  Surgeon: Malen Gauze, MD;  Location: [facility];  Service: Urology;  Laterality: Right;   CYSTOSCOPY W/ URETERAL STENT PLACEMENT Right 03/05/2021   Procedure: CYSTOURETEROSCOPY WITH RETROGRADE PYELOGRAM/URETERAL STENT PLACEMENT RIGHT;  Surgeon: Malen Gauze, MD;  Location: [facility];  Service: Urology;  Laterality: Right;   CYSTOSCOPY WITH RETROGRADE PYELOGRAM, URETEROSCOPY AND STENT PLACEMENT Left 07/15/2012   Procedure: CYSTOSCOPY WITH left RETROGRADE PYELOGRAM,left  URETEROSCOPY AND left STENT exchange ;  Surgeon: Milford Cage, MD;  Location: [facility];  Service: Urology;  Laterality: Left;  LEFT URETER STENT EXCHANGE     CYSTOSCOPY WITH RETROGRADE PYELOGRAM, URETEROSCOPY AND STENT PLACEMENT Right 08/11/2016   Procedure: CYSTOSCOPY WITH RIGHT RETROGRADE PYELOGRAM,RIGHT  URETEROSCOPY AND STENT CHANGE;  Surgeon: Ihor Gully, MD;  Location: [facility];  Service: Urology;  Laterality: Right;   CYSTOSCOPY WITH URETEROSCOPY AND STENT PLACEMENT  Right 07/12/2021   Procedure: CYSTOSCOPY WITH RIGHT URETEROSCOPY AND STENT PLACEMENT;  Surgeon: [name], MD;  Location: [facility];  Service: Urology;  Laterality: Right;   CYSTOSCOPY/RETROGRADE/URETEROSCOPY/STONE EXTRACTION WITH BASKET  AGE 17   CYSTOSCOPY/URETEROSCOPY/HOLMIUM LASER/STENT PLACEMENT Bilateral  09/19/2018   Procedure: CYSTOSCOPY/RETROGRADE/URETEROSCOPY/HOLMIUM LASER/STENT PLACEMENT;  Surgeon: [name], MD;  Location: [facility];  Service: Urology;  Laterality: Bilateral;   CYSTOSCOPY/URETEROSCOPY/HOLMIUM LASER/STENT PLACEMENT Right 10/22/2018   Procedure: CYSTOSCOPY/RETROGRADE/URETEROSCOPY/HOLMIUM LASER/STENT PLACEMENT;  Surgeon: [name], MD;  Location: [facility];  Service: Urology;  Laterality: Right;  ONLY NEEDS 60 MIN   CYSTOSCOPY/URETEROSCOPY/HOLMIUM LASER/STENT PLACEMENT Right 02/11/2019   Procedure: CYSTOSCOPY/RETROGRADE/URETEROSCOPY/HOLMIUM LASER/STENT PLACEMENT;  Surgeon: [name], MD;  Location: [facility];  Service: Urology;  Laterality: Right;   CYSTOSCOPY/URETEROSCOPY/HOLMIUM LASER/STENT PLACEMENT Bilateral 06/03/2019   Procedure: CYSTOSCOPY/RETROGRADE/URETEROSCOPY/HOLMIUM LASER/STENT PLACEMENT;  Surgeon: [name], MD;  Location: [facility];  Service: Urology;  Laterality: Bilateral;   CYSTOSCOPY/URETEROSCOPY/HOLMIUM LASER/STENT PLACEMENT Right 12/30/2019   Procedure: CYSTOSCOPY, RETROGRADE /URETEROSCOPY/HOLMIUM LASER/STENT EXCHANGE;  Surgeon: [name], MD;  Location: [facility];  Service: Urology;  Laterality: Right;   CYSTOSCOPY/URETEROSCOPY/HOLMIUM LASER/STENT PLACEMENT Right 10/17/2022   Procedure: CYSTOSCOPY/URETEROSCOPY/HOLMIUM LASER/STENT PLACEMENT/RIGHT NEPHROSTOMY TUBE REMOVAL;  Surgeon: [name], MD;  Location: [facility];  Service: Urology;  Laterality: Right;   CYSTOSCOPY/URETEROSCOPY/HOLMIUM LASER/STENT PLACEMENT Left 12/01/2022   Procedure: CYSTOSCOPY/URETEROSCOPY/HOLMIUM LASER/STENT PLACEMENT;  Surgeon: Joline Maxcy, MD;  Location: [facility];  Service: Urology;  Laterality: Left;   EXTRACORPOREAL SHOCK WAVE LITHOTRIPSY Left 08/07/2019   Procedure: EXTRACORPOREAL SHOCK WAVE LITHOTRIPSY (ESWL);  Surgeon: Noel Christmas, MD;  Location: [facility];  Service: Urology;  Laterality: Left;   EXTRACORPOREAL SHOCK WAVE LITHOTRIPSY Left 04/08/2020   Procedure: EXTRACORPOREAL SHOCK WAVE LITHOTRIPSY (ESWL);  Surgeon: Belva Agee, MD;  Location: [facility];  Service: Urology;  Laterality: Left;   EXTRACORPOREAL SHOCK WAVE LITHOTRIPSY Right 12/16/2020   Procedure: EXTRACORPOREAL SHOCK WAVE LITHOTRIPSY (ESWL);  Surgeon: Alfredo Martinez, MD;  Location: [facility];  Service: Urology;  Laterality: Right;   HOLMIUM LASER APPLICATION Left 07/15/2012   Procedure: HOLMIUM LASER APPLICATION;  Surgeon: Milford Cage, MD;  Location: [facility];  Service: Urology;  Laterality: Left;   HOLMIUM LASER APPLICATION Right 08/11/2016   Procedure: HOLMIUM LASER APPLICATION;  Surgeon: Ihor Gully, MD;  Location: [facility];  Service: Urology;  Laterality: Right;   HOLMIUM LASER APPLICATION Right 02/11/2019   Procedure: HOLMIUM LASER APPLICATION;  Surgeon: [name], MD;  Location: [facility];  Service: Urology;  Laterality: Right;   IR NEPHROSTOMY PLACEMENT RIGHT  10/16/2022   TONSILLECTOMY  AS CHILD   URETEROSCOPY      SH: Social History   Tobacco Use   Smoking status: Never   Smokeless tobacco: Never  Vaping Use   Vaping status: Never Used  Substance Use Topics   Alcohol use: Yes    Comment: occasional   Drug use: No    ROS: Constitutional:  Negative for fever, chills, weight loss CV: Negative for chest pain, previous MI, hypertension Respiratory:  Negative for shortness of breath, wheezing, sleep apnea, frequent cough GI:  Negative for nausea, vomiting, bloody stool, GERD  PE: BP 120/83   Pulse 83  GENERAL APPEARANCE:  Well appearing, well developed, well nourished, NAD    Procedure:  Cystoscopy with stent removal  Indication:  nephrolithiasis with ureteral stent  DESCRIPTION OF PROCEDURE: The patient was brought to the procedure  room where she was correctly identified.  The procedure was again reviewed with her and informed consent was obtained.  Preprocedural timeout was performed.  Flexible cystoscopy was subsequently performed. The double-J stent was seen coming from the left ureteral opening and was grasped with a flexible graspers and removed intact without difficulty. Procedure was well-tolerated.  No complications.

## 2023-02-05 ENCOUNTER — Ambulatory Visit (INDEPENDENT_AMBULATORY_CARE_PROVIDER_SITE_OTHER): Payer: BC Managed Care – PPO | Admitting: Podiatry

## 2023-02-05 ENCOUNTER — Ambulatory Visit (INDEPENDENT_AMBULATORY_CARE_PROVIDER_SITE_OTHER): Payer: BC Managed Care – PPO

## 2023-02-05 DIAGNOSIS — M722 Plantar fascial fibromatosis: Secondary | ICD-10-CM

## 2023-02-05 MED ORDER — DICLOFENAC SODIUM 75 MG PO TBEC: 75.0000 mg | DELAYED_RELEASE_TABLET | Freq: Two times a day (BID) | ORAL | 2 refills | Status: DC

## 2023-02-05 MED ORDER — TRIAMCINOLONE ACETONIDE 10 MG/ML IJ SUSP
10.0000 mg | Freq: Once | INTRAMUSCULAR | Status: AC
Start: 2023-02-05 — End: 2023-02-05
  Administered 2023-02-05: 10 mg via INTRA_ARTICULAR

## 2023-02-05 NOTE — Patient Instructions (Signed)

## 2023-02-06 NOTE — Progress Notes (Signed)
Subjective:   Patient ID: Phyllis House, female   DOB: 47 y.o.   MRN: [id number]   HPI Patient presents with a lot of pain in her right heel and states it is not as much in the back as it is in the bottom now states it sore hard to walk on   ROS      Objective:  Physical Exam  Neurovascular status intact with exquisite discomfort medial fascial band right at the insertional point tendon calcaneus for several months duration     Assessment:  Acute plantar fasciitis right with fluid buildup around the medial band     Plan:  H&P reviewed went ahead today did sterile prep injected the fascia at insertion 3 mg Kenalog 5 mg Xylocaine applied fascial brace with instructions on wearing this oral anti-inflammatories and good support.  Reappoint 2 weeks may require orthotics or other treatment  X-rays indicate spur formation both posterior and plantar heel no indication stress fracture arthritis

## 2023-02-19 ENCOUNTER — Encounter: Payer: Self-pay | Admitting: Podiatry

## 2023-02-19 ENCOUNTER — Ambulatory Visit (INDEPENDENT_AMBULATORY_CARE_PROVIDER_SITE_OTHER): Payer: BC Managed Care – PPO | Admitting: Podiatry

## 2023-02-19 DIAGNOSIS — M722 Plantar fascial fibromatosis: Secondary | ICD-10-CM | POA: Diagnosis not present

## 2023-02-19 MED ORDER — TRIAMCINOLONE ACETONIDE 10 MG/ML IJ SUSP
10.0000 mg | Freq: Once | INTRAMUSCULAR | Status: AC
Start: 2023-02-19 — End: 2023-02-19
  Administered 2023-02-19: 10 mg via INTRA_ARTICULAR

## 2023-02-20 NOTE — Progress Notes (Signed)
Subjective:   Patient ID: Phyllis House, female   DOB: 47 y.o.   MRN: [id number]   HPI Patient states still having discomfort but I am improved from where I was previously   ROS      Objective:  Physical Exam  Neurovascular status intact exquisite discomfort medial fascial band right at the insertional point with improvement from previous but quite sore at the current time     Assessment:  Plan fasciitis right improving still present     Plan:  Reviewed condition went ahead today did sterile prep reinjected the plantar fascia 3 mg Kenalog 5 mg Xylocaine applied sterile dressing patient discharged reappoint to recheck

## 2023-02-22 ENCOUNTER — Telehealth: Payer: Self-pay

## 2023-02-22 NOTE — Telephone Encounter (Signed)
Come in tomorrow to be checked

## 2023-02-22 NOTE — Telephone Encounter (Signed)
Patient called and states that she was seen on Monday and told that she was good to go as far as her right foot. She states that she was running yesterday and thinks she injured her foot. She states that it started hurting immediately and is now limping. She would like to know if she should come back in or advise to do something else.

## 2023-02-23 ENCOUNTER — Ambulatory Visit (INDEPENDENT_AMBULATORY_CARE_PROVIDER_SITE_OTHER): Payer: BC Managed Care – PPO

## 2023-02-23 ENCOUNTER — Encounter: Payer: Self-pay | Admitting: Podiatry

## 2023-02-23 ENCOUNTER — Ambulatory Visit (INDEPENDENT_AMBULATORY_CARE_PROVIDER_SITE_OTHER): Payer: BC Managed Care – PPO | Admitting: Podiatry

## 2023-02-23 DIAGNOSIS — M629 Disorder of muscle, unspecified: Secondary | ICD-10-CM

## 2023-02-23 DIAGNOSIS — S9031XA Contusion of right foot, initial encounter: Secondary | ICD-10-CM

## 2023-02-23 NOTE — Progress Notes (Unsigned)
Subjective: Chief Complaint  Patient presents with   Foot Injury    Arch right - patient being treated for PF by Dr. Charlsie Merles, Wednesday (02/21/23) she took off running at school and felt a sharp pain in the arch and the pain hasn't subsided, continuing to wear PF brace   47 year old female presents the above concerns.  She recently just saw Dr. Charlsie Merles on Monday for heel pain on Wednesday she took off as she felt sharp pain, pop in the arch of the foot and since then the pain has not subsided.  She subsequently the plantar fascial brace.  No other injuries.  Mild swelling.  No other concerns.   Objective: AAO x3, NAD DP/PT pulses palpable bilaterally, CRT less than 3 seconds Tenderness palpation on the arch of the foot on the medial band and plantar fascia.  Unable to fully palpate the bands of plantar fascia compared to contra extremity but there is edema present.  There is no area pinpoint tenderness. No pain with calf compression, swelling, warmth, erythema  Assessment: Likely plantar fascial tear  Plan: -All treatment options discussed with the patient including all alternatives, risks, complications.  -X-rays obtained reviewed.  3 views of the foot were obtained.  No evidence of acute fracture. -Recommend immobilization in surgical boot which was dispensed help facilitate soft tissue and.  Ice, elevation.  Anti-inflammatories as needed. -Patient encouraged to call the office with any questions, concerns, change in symptoms.   Vivi Barrack DPM

## 2023-02-23 NOTE — Telephone Encounter (Signed)
Patient made appointment with a different provider.

## 2023-03-10 ENCOUNTER — Other Ambulatory Visit: Payer: Self-pay

## 2023-03-10 ENCOUNTER — Ambulatory Visit
Admission: RE | Admit: 2023-03-10 | Discharge: 2023-03-10 | Disposition: A | Discharge status: Home / Self Care | Payer: BC Managed Care – PPO | Source: Ambulatory Visit

## 2023-03-10 VITALS — BP 119/73 | HR 53 | Temp 98.6°F | Resp 16

## 2023-03-10 DIAGNOSIS — R21 Rash and other nonspecific skin eruption: Secondary | ICD-10-CM

## 2023-03-10 MED ORDER — PREDNISONE 10 MG (21) PO TBPK: ORAL_TABLET | Freq: Every day | ORAL | 0 refills | Status: DC

## 2023-03-10 MED ORDER — DEXAMETHASONE SODIUM PHOSPHATE 10 MG/ML IJ SOLN
10.0000 mg | Freq: Once | INTRAMUSCULAR | Status: AC
  Administered 2023-03-10: 10 mg via INTRAMUSCULAR

## 2023-03-10 NOTE — Discharge Instructions (Addendum)
Today you are being treated for a rash, appears inflammatory and a reaction to something skin has come in contact with at this time there are no signs of infection  You have been given an injection of steroids today in the office today to help reduce the inflammatory process that occurs with this rash which will help minimize your itching as well as begin to clear  Starting tomorrow take prednisone every morning with food as directed, to continue the above process  You may continue use of topical calamine or Benadryl cream to help manage itching, you may also continue oral Benadryl  Please avoid long exposures to heat such as a hot steamy shower or being outside as this may cause further irritation to your rash  You may follow-up with his urgent care as needed if symptoms persist or worsen

## 2023-03-10 NOTE — ED Triage Notes (Signed)
C/O sporadic, bumpy, pruritic rash to bilat arms approx 1 wk ago; same rash started on bilat thighs over past couple days, and continues to spread. Has been applying hydrocortisone.

## 2023-03-10 NOTE — ED Provider Notes (Signed)
Renaldo Fiddler    CSN: [id number] Arrival date & time: 03/10/23  1418      History   Chief Complaint Chief Complaint  Patient presents with   Rash   Appt 1415    HPI Phyllis House is a 47 y.o. female.   Patient presents for evaluation of a erythematous pruritic rash that began on the left upper arm beginning 7 days ago.  Has spread to the right arm and lower extremities.  Pruritus is described as severe and feels as if her skin is starting to bruise due to persistent itching endorses that she was traveling during onset but accompanying members of household have no symptoms.  Denies changes in toiletries, diet, new foods.  Has attempted use of oral antihistamines, hydrocortisone with no improvement of symptoms.  Past Medical History:  Diagnosis Date   Anxiety    Depression    Eczema    H/O varicella    Hematuria    History of herpes genitalis    History of kidney stones    pt has long hx calcium phosphate stones   Hypocalcemia    Migraine    Pneumonia    once 10+ yeaers ago   Polycystic ovarian syndrome    PONV (postoperative nausea and vomiting)    Renal tubular acidosis type I    Right ureteral stone    Seasonal allergies    Urgency of urination     Patient Active Problem List   Diagnosis Date Noted   Hydronephrosis 10/15/2022   Ureteral stone with hydronephrosis 03/05/2021   Anxiety 10/06/2020   Obesity 10/06/2020   Ureteral calculus 12/12/2019   Renal tubular acidosis type I 06/24/2019   Hydronephrosis concurrent with and due to calculi of kidney and ureter 05/08/2019   Migraine without aura and without status migrainosus, not intractable 11/19/2013   Other malaise and fatigue 03/14/2013   Hypokalemia 12/26/2012   Infection of urinary tract 07/02/2012   Ureteral calculus, left 07/01/2012   Fever, unspecified 07/01/2012    Past Surgical History:  Procedure Laterality Date   CYSTOSCOPY W/ RETROGRADES Right 12/12/2019   Procedure: CYSTOSCOPY  WITH RIGHT RETROGRADE PYELOGRAM AND STENT PLACEMENT;  Surgeon: Heloise Purpura, MD;  Location: [facility];  Service: Urology;  Laterality: Right;   CYSTOSCOPY W/ URETERAL STENT PLACEMENT Left 07/01/2012   Procedure: CYSTOSCOPY WITH RETROGRADE PYELOGRAM/URETERAL STENT PLACEMENT;  Surgeon: Milford Cage, MD;  Location: [facility];  Service: Urology;  Laterality: Left;   CYSTOSCOPY W/ URETERAL STENT PLACEMENT Right 07/27/2016   Procedure: CYSTOSCOPY WITH RETROGRADE PYELOGRAM/URETERAL STENT PLACEMENT;  Surgeon: Alfredo Martinez, MD;  Location: [facility];  Service: Urology;  Laterality: Right;   CYSTOSCOPY W/ URETERAL STENT PLACEMENT Right 05/08/2019   Procedure: CYSTOSCOPY WITH RETROGRADE PYELOGRAM/URETERAL STENT PLACEMENT;  Surgeon: Malen Gauze, MD;  Location: [facility];  Service: Urology;  Laterality: Right;   CYSTOSCOPY W/ URETERAL STENT PLACEMENT Right 03/05/2021   Procedure: CYSTOURETEROSCOPY WITH RETROGRADE PYELOGRAM/URETERAL STENT PLACEMENT RIGHT;  Surgeon: Malen Gauze, MD;  [facility];  Service: Urology;  Laterality: Right;   CYSTOSCOPY WITH RETROGRADE PYELOGRAM, URETEROSCOPY AND STENT PLACEMENT Left 07/15/2012   Procedure: CYSTOSCOPY WITH left RETROGRADE PYELOGRAM,left  URETEROSCOPY AND left STENT exchange ;  Surgeon: Milford Cage, MD;  Location: [facility];  Service: Urology;  Laterality: Left;  LEFT URETER STENT EXCHANGE     CYSTOSCOPY WITH RETROGRADE PYELOGRAM, URETEROSCOPY AND STENT PLACEMENT Right 08/11/2016   Procedure: CYSTOSCOPY WITH RIGHT RETROGRADE PYELOGRAM,RIGHT  URETEROSCOPY AND STENT  CHANGE;  Surgeon: Ihor Gully, MD;  Location: [facility];  Service: Urology;  Laterality: Right;   CYSTOSCOPY WITH URETEROSCOPY AND STENT PLACEMENT Right 07/12/2021   Procedure: CYSTOSCOPY WITH RIGHT URETEROSCOPY AND STENT PLACEMENT;  Surgeon: [name], MD;  [facility];  Service: Urology;  Laterality: Right;    CYSTOSCOPY/RETROGRADE/URETEROSCOPY/STONE EXTRACTION WITH BASKET  AGE 43   CYSTOSCOPY/URETEROSCOPY/HOLMIUM LASER/STENT PLACEMENT Bilateral 09/19/2018   Procedure: CYSTOSCOPY/RETROGRADE/URETEROSCOPY/HOLMIUM LASER/STENT PLACEMENT;  Surgeon: [name], MD;  [facility];  Service: Urology;  Laterality: Bilateral;   CYSTOSCOPY/URETEROSCOPY/HOLMIUM LASER/STENT PLACEMENT Right 10/22/2018   Procedure: CYSTOSCOPY/RETROGRADE/URETEROSCOPY/HOLMIUM LASER/STENT PLACEMENT;  Surgeon: [name], MD;  Location: [facility];  Service: Urology;  Laterality: Right;  ONLY NEEDS 60 MIN   CYSTOSCOPY/URETEROSCOPY/HOLMIUM LASER/STENT PLACEMENT Right 02/11/2019   Procedure: CYSTOSCOPY/RETROGRADE/URETEROSCOPY/HOLMIUM LASER/STENT PLACEMENT;  Surgeon: [name], MD;  Location: [facility];  Service: Urology;  Laterality: Right;   CYSTOSCOPY/URETEROSCOPY/HOLMIUM LASER/STENT PLACEMENT Bilateral 06/03/2019   Procedure: CYSTOSCOPY/RETROGRADE/URETEROSCOPY/HOLMIUM LASER/STENT PLACEMENT;  Surgeon: [name], MD;  Location: [facility];  Service: Urology;  Laterality: Bilateral;   CYSTOSCOPY/URETEROSCOPY/HOLMIUM LASER/STENT PLACEMENT Right 12/30/2019   Procedure: CYSTOSCOPY, RETROGRADE /URETEROSCOPY/HOLMIUM LASER/STENT EXCHANGE;  Surgeon: [name], MD;  Location: [facility];  Service: Urology;  Laterality: Right;   CYSTOSCOPY/URETEROSCOPY/HOLMIUM LASER/STENT PLACEMENT Right 10/17/2022   Procedure: CYSTOSCOPY/URETEROSCOPY/HOLMIUM LASER/STENT PLACEMENT/RIGHT NEPHROSTOMY TUBE REMOVAL;  Surgeon: [name], MD;  [facility];  Service: Urology;  Laterality: Right;   CYSTOSCOPY/URETEROSCOPY/HOLMIUM LASER/STENT PLACEMENT Left 12/01/2022   Procedure: CYSTOSCOPY/URETEROSCOPY/HOLMIUM LASER/STENT PLACEMENT;  Surgeon: Joline Maxcy, MD;  [facility];  Service: Urology;  Laterality: Left;   EXTRACORPOREAL SHOCK WAVE LITHOTRIPSY  Left 08/07/2019   Procedure: EXTRACORPOREAL SHOCK WAVE LITHOTRIPSY (ESWL);  Surgeon: Noel Christmas, MD;  Location: [facility];  Service: Urology;  Laterality: Left;   EXTRACORPOREAL SHOCK WAVE LITHOTRIPSY Left 04/08/2020   Procedure: EXTRACORPOREAL SHOCK WAVE LITHOTRIPSY (ESWL);  Surgeon: Belva Agee, MD;  [facility]  Service: Urology;  Laterality: Left;   EXTRACORPOREAL SHOCK WAVE LITHOTRIPSY Right 12/16/2020   Procedure: EXTRACORPOREAL SHOCK WAVE LITHOTRIPSY (ESWL);  Surgeon: Alfredo Martinez, MD;  Location: [facility];  Service: Urology;  Laterality: Right;   HOLMIUM LASER APPLICATION Left 07/15/2012   Procedure: HOLMIUM LASER APPLICATION;  Surgeon: Milford Cage, MD;  Location: [facility];  Service: Urology;  Laterality: Left;   HOLMIUM LASER APPLICATION Right 08/11/2016   Procedure: HOLMIUM LASER APPLICATION;  Surgeon: Ihor Gully, MD;  Location: [facility];  Service: Urology;  Laterality: Right;   HOLMIUM LASER APPLICATION Right 02/11/2019   Procedure: HOLMIUM LASER APPLICATION;  Surgeon: [name], MD;  Location: [facility]  Service: Urology;  Laterality: Right;   IR NEPHROSTOMY PLACEMENT RIGHT  10/16/2022   TONSILLECTOMY  AS CHILD   URETEROSCOPY      OB History     Gravida  2   Para  2   Term  2   Preterm      AB      Living  2      SAB      IAB      Ectopic      Multiple      Live Births  2            Home Medications    Prior to Admission medications   Medication Sig Start Date End Date Taking? Authorizing Provider  Cyanocobalamin (VITAMIN B-12 PO) Take by  mouth.   Yes [provider]  Ferrous Sulfate (IRON PO) Take by mouth.   Yes [provider]  levonorgestrel (MIRENA) 20 MCG/24HR IUD 1 each by Intrauterine route once. Implanted November 2019   Yes [provider]  predniSONE (STERAPRED  UNI-PAK 21 TAB) 10 MG (21) TBPK tablet Take by mouth daily. Take 6 tabs by mouth daily  for 2 days, then 5 tabs for 2 days, then 4 tabs for 2 days, then 3 tabs for 2 days, 2 tabs for 2 days, then 1 tab by mouth daily for 2 days 03/10/23  Yes Levaeh Vice R, NP  zonisamide (ZONEGRAN) 100 MG capsule Take 100 mg by mouth at bedtime. 10/05/22  Yes [provider]  acetaminophen (TYLENOL) 500 MG tablet Take 2 tablets (1,000 mg total) by mouth every 8 (eight) hours as needed for mild pain. 10/17/22   Joseph Art, DO  diclofenac (VOLTAREN) 75 MG EC tablet Take 1 tablet (75 mg total) by mouth 2 (two) times daily. 02/05/23   Lenn Sink, DPM  ondansetron (ZOFRAN) 4 MG tablet Take 1 tablet (4 mg total) by mouth every 6 (six) hours. 10/14/22   Prosperi, Christian H, PA-C  ondansetron (ZOFRAN) 4 MG tablet Take 1 tablet (4 mg total) by mouth every 6 (six) hours. 12/12/22   Anders Simmonds T, DO  ondansetron (ZOFRAN-ODT) 4 MG disintegrating tablet Take 1 tablet (4 mg total) by mouth every 8 (eight) hours as needed for nausea or vomiting. 11/01/21   Long, Arlyss Repress, MD  oxyCODONE (ROXICODONE) 5 MG immediate release tablet Take 1 tablet (5 mg total) by mouth every 6 (six) hours as needed for severe pain. 12/11/22   Joline Maxcy, MD  oxyCODONE-acetaminophen (PERCOCET/ROXICET) 5-325 MG tablet Take 1 tablet by mouth every 6 (six) hours as needed for severe pain. 10/17/22   Joseph Art, DO  senna-docusate (SENOKOT-S) 8.6-50 MG tablet Take 1 tablet by mouth daily. 11/01/21   Long, Arlyss Repress, MD  sulfamethoxazole-trimethoprim (BACTRIM DS) 800-160 MG tablet Take 1 tablet by mouth every 12 (twelve) hours. 12/08/22   Joline Maxcy, MD  tamsulosin (FLOMAX) 0.4 MG CAPS capsule Take 0.4 mg by mouth daily.    [provider]    Family History Family History  Problem Relation Age of Onset   Thyroid disease Mother    Depression Mother    Cancer Father        leaukemia   Thyroid disease Brother     Cancer Maternal Grandmother        GI   Diabetes Maternal Grandfather    Heart disease Paternal Grandmother    Diabetes Maternal Aunt     Social History Social History   Tobacco Use   Smoking status: Never   Smokeless tobacco: Never  Vaping Use   Vaping status: Never Used  Substance Use Topics   Alcohol use: Not Currently    Comment: occasional   Drug use: No     Allergies   Adhesive [tape]   Review of Systems Review of Systems  Skin:  Positive for rash.     Physical Exam Triage Vital Signs ED Triage Vitals [03/10/23 1443]  Encounter Vitals Group     BP 119/73     Systolic BP Percentile      Diastolic BP Percentile      Pulse Rate (!) 53     Resp 16     Temp 98.6 F (37 C)     Temp  Source Oral     SpO2 99 %     Weight      Height      Head Circumference      Peak Flow      Pain Score 0     Pain Loc      Pain Education      Exclude from Growth Chart    No data found.  Updated Vital Signs BP 119/73   Pulse (!) 53   Temp 98.6 F (37 C) (Oral)   Resp 16   SpO2 99%   Visual Acuity Right Eye Distance:   Left Eye Distance:   Bilateral Distance:    Right Eye Near:   Left Eye Near:    Bilateral Near:     Physical Exam Constitutional:      Appearance: Normal appearance.  Eyes:     Extraocular Movements: Extraocular movements intact.  Pulmonary:     Effort: Pulmonary effort is normal.  Skin:    Comments: Clusters of erythematous papules spread over the bilateral upper and lower extremities  Neurological:     Mental Status: She is alert and oriented to person, place, and time. Mental status is at baseline.      UC Treatments / Results  Labs (all labs ordered are listed, but only abnormal results are displayed) Labs Reviewed - No data to display  EKG   Radiology No results found.  Procedures Procedures (including critical care time)  Medications Ordered in UC Medications  dexamethasone (DECADRON) injection 10 mg (has no  administration in time range)    Initial Impression / Assessment and Plan / UC Course  I have reviewed the triage vital signs and the nursing notes.  Pertinent labs & imaging results that were available during my care of the patient were reviewed by me and considered in my medical decision making (see chart for details).  Rash  Presentation consistent with a contact dermatitis, at this time no signs of infection, discussed with patient, Decadron IM given and prescribed prednisone taper for outpatient use recommended continued use of oral topical antihistamines and additional supportive measures for management of pruritus, advised against long exposure to heat to prevent further irritation and advised follow-up if symptoms persist worsen or recur Final Clinical Impressions(s) / UC Diagnoses   Final diagnoses:  Rash     Discharge Instructions      Today you are being treated for a rash, appears inflammatory and a reaction to something skin has come in contact with at this time there are no signs of infection  You have been given an injection of steroids today in the office today to help reduce the inflammatory process that occurs with this rash which will help minimize your itching as well as begin to clear  Starting tomorrow take prednisone every morning with food as directed, to continue the above process  You may continue use of topical calamine or Benadryl cream to help manage itching, you may also continue oral Benadryl  Please avoid long exposures to heat such as a hot steamy shower or being outside as this may cause further irritation to your rash  You may follow-up with his urgent care as needed if symptoms persist or worsen    ED Prescriptions     Medication Sig Dispense Auth. Provider   predniSONE (STERAPRED UNI-PAK 21 TAB) 10 MG (21) TBPK tablet Take by mouth daily. Take 6 tabs by mouth daily  for 2 days, then 5 tabs for 2 days,  then 4 tabs for 2 days, then 3 tabs for 2  days, 2 tabs for 2 days, then 1 tab by mouth daily for 2 days 42 tablet Johann Santone, Elita Boone, NP      PDMP not reviewed this encounter.   Valinda Hoar, NP 03/10/23 1505

## 2023-03-22 ENCOUNTER — Ambulatory Visit: Payer: BC Managed Care – PPO | Admitting: Podiatry

## 2023-03-22 ENCOUNTER — Encounter: Payer: Self-pay | Admitting: Podiatry

## 2023-03-22 DIAGNOSIS — M722 Plantar fascial fibromatosis: Secondary | ICD-10-CM

## 2023-03-22 DIAGNOSIS — M629 Disorder of muscle, unspecified: Secondary | ICD-10-CM | POA: Diagnosis not present

## 2023-03-23 NOTE — Progress Notes (Signed)
Subjective:   Patient ID: Phyllis House, female   DOB: 47 y.o.   MRN: [id number]   HPI Patient states feeling a lot better after having had tear of the right plantar fascia   ROS      Objective:  Physical Exam  Neurovascular status intact with significant diminishment of discomfort in the right plantar fascia with mild discomfort into the arch but much better     Assessment:  Improving from having had a tear of the plantar fascia right with chronic Planter fasciitis     Plan:  H&P reviewed and discussed the continuation of conservative care for this and stretching exercises and other treatment ultimately may be necessary but I am pleased with where she is now.  Reappoint as symptoms indicate

## 2023-04-27 ENCOUNTER — Ambulatory Visit (INDEPENDENT_AMBULATORY_CARE_PROVIDER_SITE_OTHER): Payer: BC Managed Care – PPO | Admitting: Podiatry

## 2023-04-27 ENCOUNTER — Ambulatory Visit (INDEPENDENT_AMBULATORY_CARE_PROVIDER_SITE_OTHER): Payer: BC Managed Care – PPO

## 2023-04-27 ENCOUNTER — Encounter: Payer: Self-pay | Admitting: Podiatry

## 2023-04-27 DIAGNOSIS — M778 Other enthesopathies, not elsewhere classified: Secondary | ICD-10-CM | POA: Diagnosis not present

## 2023-04-27 DIAGNOSIS — M216X1 Other acquired deformities of right foot: Secondary | ICD-10-CM

## 2023-04-27 DIAGNOSIS — M722 Plantar fascial fibromatosis: Secondary | ICD-10-CM

## 2023-04-27 MED ORDER — MELOXICAM 15 MG PO TABS: 15.0000 mg | ORAL_TABLET | Freq: Every day | ORAL | 0 refills | Status: DC

## 2023-04-27 MED ORDER — METHYLPREDNISOLONE 4 MG PO TBPK
ORAL_TABLET | ORAL | 0 refills | Status: DC
Start: 2023-04-27 — End: 2023-06-15

## 2023-04-27 MED ORDER — TRIAMCINOLONE ACETONIDE 10 MG/ML IJ SUSP
10.0000 mg | Freq: Once | INTRAMUSCULAR | Status: AC
Start: 2023-04-27 — End: 2023-04-27
  Administered 2023-04-27: 10 mg

## 2023-04-27 NOTE — Progress Notes (Unsigned)
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 She thinks she has reinjured the foot again and is having to walk on the ball of the foot and causing a callus to form.     HPI: 47 y.o. female presenting today with c/o pain in the bottom of the right heel. She has been treated by Dr. Charlsie Merles for Plantar Fasciitis, and subsequently did have a tear end of October. She had been doing well for a period of time, has had recurrence of heel pain over the past couple weeks. Presents in regular shoe gear today.  Past Medical History:  Diagnosis Date   Anxiety    Depression    Eczema    H/O varicella    Hematuria    History of herpes genitalis    History of kidney stones    pt has long hx calcium phosphate stones   Hypocalcemia    Migraine    Pneumonia    once 10+ yeaers ago   Polycystic ovarian syndrome    PONV (postoperative nausea and vomiting)    Renal tubular acidosis type I    Right ureteral stone    Seasonal allergies    Urgency of urination     Past Surgical History:  Procedure Laterality Date   CYSTOSCOPY W/ RETROGRADES Right 12/12/2019   Procedure: CYSTOSCOPY WITH RIGHT RETROGRADE PYELOGRAM AND STENT PLACEMENT;  Surgeon: Heloise Purpura, MD;  Location: [facility];  Service: Urology;  Laterality: Right;   CYSTOSCOPY W/ URETERAL STENT PLACEMENT Left 07/01/2012   Procedure: CYSTOSCOPY WITH RETROGRADE PYELOGRAM/URETERAL STENT PLACEMENT;  Surgeon: Milford Cage, MD;  Location[facility];  Service: Urology;  Laterality: Left;   CYSTOSCOPY W/ URETERAL STENT PLACEMENT Right 07/27/2016   Procedure: CYSTOSCOPY WITH RETROGRADE PYELOGRAM/URETERAL STENT PLACEMENT;  Surgeon: Alfredo Martinez, MD;  Location: [facility];  Service: Urology;  Laterality: Right;   CYSTOSCOPY W/ URETERAL STENT PLACEMENT Right 05/08/2019   Procedure: CYSTOSCOPY WITH RETROGRADE PYELOGRAM/URETERAL STENT PLACEMENT;  Surgeon:  Malen Gauze, MD;  Location: [facility];  Service: Urology;  Laterality: Right;   CYSTOSCOPY W/ URETERAL STENT PLACEMENT Right 03/05/2021   Procedure: CYSTOURETEROSCOPY WITH RETROGRADE PYELOGRAM/URETERAL STENT PLACEMENT RIGHT;  Surgeon: Malen Gauze, MD;  Location[facility];  Service: Urology;  Laterality: Right;   CYSTOSCOPY WITH RETROGRADE PYELOGRAM, URETEROSCOPY AND STENT PLACEMENT Left 07/15/2012   Procedure: CYSTOSCOPY WITH left RETROGRADE PYELOGRAM,left  URETEROSCOPY AND left STENT exchange ;  Surgeon: Milford Cage, MD;  Location: [facility];  Service: Urology;  Laterality: Left;  LEFT URETER STENT EXCHANGE     CYSTOSCOPY WITH RETROGRADE PYELOGRAM, URETEROSCOPY AND STENT PLACEMENT Right 08/11/2016   Procedure: CYSTOSCOPY WITH RIGHT RETROGRADE PYELOGRAM,RIGHT  URETEROSCOPY AND STENT CHANGE;  Surgeon: Ihor Gully, MD;  Location: [facility];  Service: Urology;  Laterality: Right;   CYSTOSCOPY WITH URETEROSCOPY AND STENT PLACEMENT Right 07/12/2021   Procedure: CYSTOSCOPY WITH RIGHT URETEROSCOPY AND STENT PLACEMENT;  Surgeon: [name], MD;  Location: [facility];  Service: Urology;  Laterality: Right;   CYSTOSCOPY/RETROGRADE/URETEROSCOPY/STONE EXTRACTION WITH BASKET  AGE 43   CYSTOSCOPY/URETEROSCOPY/HOLMIUM LASER/STENT PLACEMENT Bilateral 09/19/2018   Procedure: CYSTOSCOPY/RETROGRADE/URETEROSCOPY/HOLMIUM LASER/STENT PLACEMENT;  Surgeon: [name], MD;  Location: [facility];  Service: Urology;  Laterality: Bilateral;   CYSTOSCOPY/URETEROSCOPY/HOLMIUM LASER/STENT PLACEMENT Right 10/22/2018   Procedure: CYSTOSCOPY/RETROGRADE/URETEROSCOPY/HOLMIUM LASER/STENT PLACEMENT;  Surgeon: [name], MD;  Location: [facility];  Service: Urology;  Laterality: Right;  ONLY NEEDS 60 MIN   CYSTOSCOPY/URETEROSCOPY/HOLMIUM LASER/STENT PLACEMENT Right 02/11/2019   Procedure: CYSTOSCOPY/RETROGRADE/URETEROSCOPY/HOLMIUM LASER/STENT PLACEMENT;   Surgeon: [name], MD;  Location: [facility];  Service: Urology;  Laterality: Right;   CYSTOSCOPY/URETEROSCOPY/HOLMIUM LASER/STENT PLACEMENT Bilateral 06/03/2019   Procedure: CYSTOSCOPY/RETROGRADE/URETEROSCOPY/HOLMIUM LASER/STENT PLACEMENT;  Surgeon: [name], MD;  Location: [facility];  Service: Urology;  Laterality: Bilateral;   CYSTOSCOPY/URETEROSCOPY/HOLMIUM LASER/STENT PLACEMENT Right 12/30/2019   Procedure: CYSTOSCOPY, RETROGRADE /URETEROSCOPY/HOLMIUM LASER/STENT EXCHANGE;  Surgeon: [name], MD;  Location: [facility];  Service: Urology;  Laterality: Right;   CYSTOSCOPY/URETEROSCOPY/HOLMIUM LASER/STENT PLACEMENT Right 10/17/2022   Procedure: CYSTOSCOPY/URETEROSCOPY/HOLMIUM LASER/STENT PLACEMENT/RIGHT NEPHROSTOMY TUBE REMOVAL;  Surgeon: [name], MD;  Location[facility];  Service: Urology;  Laterality: Right;   CYSTOSCOPY/URETEROSCOPY/HOLMIUM LASER/STENT PLACEMENT Left 12/01/2022   Procedure: CYSTOSCOPY/URETEROSCOPY/HOLMIUM LASER/STENT PLACEMENT;  Surgeon: Joline Maxcy, MD;  Location[facility];  Service: Urology;  Laterality: Left;   EXTRACORPOREAL SHOCK WAVE LITHOTRIPSY Left 08/07/2019   Procedure: EXTRACORPOREAL SHOCK WAVE LITHOTRIPSY (ESWL);  Surgeon: Noel Christmas, MD;  Location: [facility];  Service: Urology;  Laterality: Left;   EXTRACORPOREAL SHOCK WAVE LITHOTRIPSY Left 04/08/2020   Procedure: EXTRACORPOREAL SHOCK WAVE LITHOTRIPSY (ESWL);  Surgeon: Belva Agee, MD;  Location: [facility];  Service: Urology;  Laterality: Left;   EXTRACORPOREAL SHOCK WAVE LITHOTRIPSY Right 12/16/2020   Procedure: EXTRACORPOREAL SHOCK WAVE LITHOTRIPSY (ESWL);  Surgeon: Alfredo Martinez, MD;  Location: [facility];  Service: Urology;  Laterality: Right;   HOLMIUM LASER APPLICATION Left 07/15/2012   Procedure: HOLMIUM LASER APPLICATION;  Surgeon: Milford Cage, MD;   Location: [facility];  Service: Urology;  Laterality: Left;   HOLMIUM LASER APPLICATION Right 08/11/2016   Procedure: HOLMIUM LASER APPLICATION;  Surgeon: Ihor Gully, MD;  Location: [facility];  Service: Urology;  Laterality: Right;   HOLMIUM LASER APPLICATION Right 02/11/2019   Procedure: HOLMIUM LASER APPLICATION;  Surgeon: [name], MD;  Location: [facility];  Service: Urology;  Laterality: Right;   IR NEPHROSTOMY PLACEMENT RIGHT  10/16/2022   TONSILLECTOMY  AS CHILD   URETEROSCOPY      Allergies  Allergen Reactions   Adhesive [Tape] Rash    Pt states paper tape causes rash, but "plastic tape" is not problematic.      Physical Exam: General: The patient is alert and oriented x3 in no acute distress.  Dermatology:  No ecchymosis, erythema, or edema bilateral.  No open lesions.    Vascular: Palpable pedal pulses bilaterally. Capillary refill within normal limits.  No appreciable edema.    Neurological: Light touch sensation intact bilateral.  MMT 5/5 to lower extremity bilateral. Negative Tinel's sign with percussion of the posterior tibial nerve on the affected extremity.    Musculoskeletal Exam:  There is pain on palpation of the plantarmedial & plantarcentral aspect of right heel.  No gaps or nodules within the plantar fascia today.  Antalgic gait noted with first few steps upon standing.  No pain on palpation of achilles tendon bilateral.  Ankle df less than 10 degrees with knee extended b/l. Early heel rise on gait, patient does report hamstring tightness.  Radiographic Exam: Right foot 04/27/23 Normal osseous mineralization. Joint spaces preserved.  No fractures or osseous irregularities noted.  Assessment/Plan of Care: 1. Capsulitis of foot   2. Plantar fascial fibromatosis   3. Acquired equinus deformity of right foot  Meds ordered this encounter  Medications   triamcinolone acetonide (KENALOG) 10 MG/ML  injection 10 mg   methylPREDNISolone (MEDROL DOSEPAK) 4 MG TBPK tablet    Sig: 6 Day Tapering Dose    Dispense:  21 tablet    Refill:  0   meloxicam (MOBIC) 15 MG tablet    Sig: Take 1 tablet (15 mg total) by mouth daily.    Dispense:  30 tablet    Refill:  0   None  -Reviewed etiology of plantar fasciitis with patient.  Discussed treatment options with patient today, including cortisone injection, NSAID course of treatment, stretching exercises, physical therapy, use of night splint, rest, icing the heel, arch supports/orthotics, and supportive shoe gear.    With the patient's verbal consent, a corticosteroid injection was administered to the right heel, consisting of a mixture of 1% lidocaine plain, 0.5% Sensorcaine plain, and Kenalog-10 for a total of 1.5cc administered.  A Band-aid was applied.  Prescribing 6-day steroid taper followed by course of oral meloxicam  Advised that patient return to the cam walking boot over the next two weeks and keep the right foot immobilized when not performing stretching and icing.  She does have boot and fascial brace at home.  Return in about 2 weeks (around 05/11/2023) for Plantar Fasciitis.   Bronwen Betters, DPM, AACFAS [facility]     [address] [place], [place] [address]                Office [phone]  Fax [phone]

## 2023-04-27 NOTE — Patient Instructions (Signed)

## 2023-05-17 ENCOUNTER — Ambulatory Visit: Payer: BC Managed Care – PPO | Admitting: Podiatry

## 2023-05-25 ENCOUNTER — Other Ambulatory Visit: Payer: Self-pay | Admitting: Podiatry

## 2023-05-25 DIAGNOSIS — M722 Plantar fascial fibromatosis: Secondary | ICD-10-CM

## 2023-06-08 ENCOUNTER — Ambulatory Visit: Payer: 59 | Admitting: Podiatry

## 2023-06-15 ENCOUNTER — Ambulatory Visit: Payer: 59 | Admitting: Podiatry

## 2023-06-15 ENCOUNTER — Encounter: Payer: Self-pay | Admitting: Podiatry

## 2023-06-15 ENCOUNTER — Ambulatory Visit (INDEPENDENT_AMBULATORY_CARE_PROVIDER_SITE_OTHER): Payer: 59

## 2023-06-15 DIAGNOSIS — M216X1 Other acquired deformities of right foot: Secondary | ICD-10-CM

## 2023-06-15 DIAGNOSIS — M722 Plantar fascial fibromatosis: Secondary | ICD-10-CM

## 2023-06-15 MED ORDER — MELOXICAM 15 MG PO TABS
15.0000 mg | ORAL_TABLET | Freq: Every day | ORAL | 0 refills | Status: DC
Start: 2023-06-15 — End: 2023-07-04

## 2023-06-15 MED ORDER — METHYLPREDNISOLONE 4 MG PO TBPK
ORAL_TABLET | ORAL | 0 refills | Status: DC
Start: 2023-06-15 — End: 2023-07-13

## 2023-06-15 NOTE — Progress Notes (Signed)
 Chief Complaint  Patient presents with   Plantar Fasciitis    Patient states that she has been in a lot of pain, she has to sit down a lot and take more pain medication. Patient has been wearing her boot. The tylenol is not helping any more. After a long day of work it is like shooting pain in her right foot. But her left one is starting hurt because she leads on it so much to take the weight off of her right foot.    HPI: 48 y.o. female presenting today for follow-up evaluation right foot plantar fasciitis.  Last seen in mid December, states that she has not really had significant improvement of symptoms during this time.  They were starting to worsen again is noticing a lot of pain with weightbearing and with certain shoes.  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  Past Medical History:  Diagnosis Date   Anxiety    Depression    Eczema    H/O varicella    Hematuria    History of herpes genitalis    History of kidney stones    pt has long hx calcium phosphate stones   Hypocalcemia    Migraine    Pneumonia    once 10+ yeaers ago   Polycystic ovarian syndrome    PONV (postoperative nausea and vomiting)    Renal tubular acidosis type I    Right ureteral stone    Seasonal allergies    Urgency of urination     Past Surgical History:  Procedure Laterality Date   CYSTOSCOPY W/ RETROGRADES Right 12/12/2019   Procedure: CYSTOSCOPY WITH RIGHT RETROGRADE PYELOGRAM AND STENT PLACEMENT;  Surgeon: Heloise Purpura, MD;  [facility]: [facility];  Service: Urology;  Laterality: Right;   CYSTOSCOPY W/ URETERAL STENT PLACEMENT Left 07/01/2012   Procedure: CYSTOSCOPY WITH RETROGRADE PYELOGRAM/URETERAL STENT PLACEMENT;  Surgeon: Milford Cage, MD;  Location: [facility];  Service: Urology;  Laterality: Left;   CYSTOSCOPY W/ URETERAL STENT PLACEMENT Right 07/27/2016   Procedure: CYSTOSCOPY WITH RETROGRADE PYELOGRAM/URETERAL STENT PLACEMENT;  Surgeon: Alfredo Martinez, MD;  [facility];  Service: Urology;  Laterality: Right;   CYSTOSCOPY W/ URETERAL STENT PLACEMENT Right 05/08/2019   Procedure: CYSTOSCOPY WITH RETROGRADE PYELOGRAM/URETERAL STENT PLACEMENT;  Surgeon: Malen Gauze, MD;  Location: [facility];  Service: Urology;  Laterality: Right;   CYSTOSCOPY W/ URETERAL STENT PLACEMENT Right 03/05/2021   Procedure: CYSTOURETEROSCOPY WITH RETROGRADE PYELOGRAM/URETERAL STENT PLACEMENT RIGHT;  Surgeon: Malen Gauze, MD;  [facility];  Service: Urology;  Laterality: Right;   CYSTOSCOPY WITH RETROGRADE PYELOGRAM, URETEROSCOPY AND STENT PLACEMENT Left 07/15/2012   Procedure: CYSTOSCOPY WITH left RETROGRADE PYELOGRAM,left  URETEROSCOPY AND left STENT exchange ;  Surgeon: Milford Cage, MD;  Location: [facility];  Service: Urology;  Laterality: Left;  LEFT URETER STENT EXCHANGE     CYSTOSCOPY WITH RETROGRADE PYELOGRAM, URETEROSCOPY AND STENT PLACEMENT Right 08/11/2016   Procedure: CYSTOSCOPY WITH RIGHT RETROGRADE PYELOGRAM,RIGHT  URETEROSCOPY AND STENT CHANGE;  Surgeon: Ihor Gully, MD;  Location: [facility];  Service: Urology;  Laterality: Right;   CYSTOSCOPY WITH URETEROSCOPY AND STENT PLACEMENT Right 07/12/2021   Procedure: CYSTOSCOPY WITH RIGHT URETEROSCOPY AND STENT PLACEMENT;  Surgeon: [name], MD;  [facility];  Service: Urology;  Laterality: Right;   CYSTOSCOPY/RETROGRADE/URETEROSCOPY/STONE EXTRACTION WITH BASKET  AGE 21   CYSTOSCOPY/URETEROSCOPY/HOLMIUM LASER/STENT PLACEMENT Bilateral 09/19/2018   Procedure: CYSTOSCOPY/RETROGRADE/URETEROSCOPY/HOLMIUM LASER/STENT PLACEMENT;  Surgeon: [name], MD;  [facility];  Service: Urology;  Laterality: Bilateral;   CYSTOSCOPY/URETEROSCOPY/HOLMIUM LASER/STENT PLACEMENT Right 10/22/2018   Procedure: CYSTOSCOPY/RETROGRADE/URETEROSCOPY/HOLMIUM LASER/STENT PLACEMENT;  Surgeon: [name], MD;  Location: [facility];  Service: Urology;   Laterality: Right;  ONLY NEEDS 60 MIN   CYSTOSCOPY/URETEROSCOPY/HOLMIUM LASER/STENT PLACEMENT Right 02/11/2019   Procedure: CYSTOSCOPY/RETROGRADE/URETEROSCOPY/HOLMIUM LASER/STENT PLACEMENT;  Surgeon: [name], MD;  Location: [facility];  Service: Urology;  Laterality: Right;   CYSTOSCOPY/URETEROSCOPY/HOLMIUM LASER/STENT PLACEMENT Bilateral 06/03/2019   Procedure: CYSTOSCOPY/RETROGRADE/URETEROSCOPY/HOLMIUM LASER/STENT PLACEMENT;  Surgeon: [name], MD;  Location: [facility];  Service: Urology;  Laterality: Bilateral;   CYSTOSCOPY/URETEROSCOPY/HOLMIUM LASER/STENT PLACEMENT Right 12/30/2019   Procedure: CYSTOSCOPY, RETROGRADE /URETEROSCOPY/HOLMIUM LASER/STENT EXCHANGE;  Surgeon: [name], MD;  Location: [facility];  Service: Urology;  Laterality: Right;   CYSTOSCOPY/URETEROSCOPY/HOLMIUM LASER/STENT PLACEMENT Right 10/17/2022   Procedure: CYSTOSCOPY/URETEROSCOPY/HOLMIUM LASER/STENT PLACEMENT/RIGHT NEPHROSTOMY TUBE REMOVAL;  Surgeon: [name], MD;  [facility];  Service: Urology;  Laterality: Right;   CYSTOSCOPY/URETEROSCOPY/HOLMIUM LASER/STENT PLACEMENT Left 12/01/2022   Procedure: CYSTOSCOPY/URETEROSCOPY/HOLMIUM LASER/STENT PLACEMENT;  Surgeon: Joline Maxcy, MD;  [facility];  Service: Urology;  Laterality: Left;   EXTRACORPOREAL SHOCK WAVE LITHOTRIPSY Left 08/07/2019   Procedure: EXTRACORPOREAL SHOCK WAVE LITHOTRIPSY (ESWL);  Surgeon: Noel Christmas, MD;  Location: [facility];  Service: Urology;  Laterality: Left;   EXTRACORPOREAL SHOCK WAVE LITHOTRIPSY Left 04/08/2020   Procedure: EXTRACORPOREAL SHOCK WAVE LITHOTRIPSY (ESWL);  Surgeon: Belva Agee, MD;  Location: [facility];  Service: Urology;  Laterality: Left;   EXTRACORPOREAL SHOCK WAVE LITHOTRIPSY Right 12/16/2020   Procedure: EXTRACORPOREAL SHOCK WAVE LITHOTRIPSY (ESWL);  Surgeon: Alfredo Martinez, MD;   Location: [facility];  Service: Urology;  Laterality: Right;   HOLMIUM LASER APPLICATION Left 07/15/2012   Procedure: HOLMIUM LASER APPLICATION;  Surgeon: Milford Cage, MD;  Location: [facility];  Service: Urology;  Laterality: Left;   HOLMIUM LASER APPLICATION Right 08/11/2016   Procedure: HOLMIUM LASER APPLICATION;  Surgeon: Ihor Gully, MD;  Location: [facility];  Service: Urology;  Laterality: Right;   HOLMIUM LASER APPLICATION Right 02/11/2019   Procedure: HOLMIUM LASER APPLICATION;  Surgeon: [name], MD;  Location: [facility];  Service: Urology;  Laterality: Right;   IR NEPHROSTOMY PLACEMENT RIGHT  10/16/2022   TONSILLECTOMY  AS CHILD   URETEROSCOPY      Allergies  Allergen Reactions   Adhesive [Tape] Rash    Pt states paper tape causes rash, but "plastic tape" is not problematic.      Physical Exam: General: The patient is alert and oriented x3 in no acute distress.  Dermatology:  No ecchymosis, erythema, or edema bilateral.  No open lesions.    Vascular: Palpable pedal pulses bilaterally. Capillary refill within normal limits.  No appreciable edema.    Neurological: Light touch sensation intact bilateral.  MMT 5/5 to lower extremity bilateral. Negative Tinel's sign with percussion of the posterior tibial nerve on the affected extremity.    Musculoskeletal Exam:  There is pain on palpation of the plantarmedial & plantarcentral aspect of right heel.  No gaps or nodules within the plantar fascia today.  Antalgic gait noted with first few steps upon standing.  No pain on palpation of achilles tendon bilateral.  Ankle df less than 10 degrees with knee extended b/l. Early heel rise on gait, patient does report hamstring tightness.  Radiographic Exam: Right foot 04/27/23 Normal osseous mineralization. Joint  spaces preserved.  No fractures or osseous irregularities noted.  Assessment/Plan of Care: 1.  Plantar fascial fibromatosis   2. Acquired equinus deformity of right foot     Meds ordered this encounter  Medications   methylPREDNISolone (MEDROL DOSEPAK) 4 MG TBPK tablet    Sig: 6 Day Tapering Dose    Dispense:  21 tablet    Refill:  0   meloxicam (MOBIC) 15 MG tablet    Sig: Take 1 tablet (15 mg total) by mouth daily for 14 days.    Dispense:  14 tablet    Refill:  0   MR FOOT RIGHT WO CONTRAST  -Reviewed etiology of plantar fasciitis with patient.  Discussed treatment options with patient today, including cortisone injection, NSAID course of treatment, stretching exercises, physical therapy, use of night splint, rest, icing the heel, arch supports/orthotics, and supportive shoe gear.    Deferring injection today as she had several injections prior to seeing me.   Prescribing 6-day steroid taper followed by course of oral meloxicam  Ordering right foot MRI as pain has been recalcitrant.  Has not had relief with CAM boot, she can try fascial brace and power steps to see if this provides any relief in the interim.  Return in about 4 weeks (around 07/13/2023) for Plantar Fasciitis, MRI results.   Bronwen Betters, DPM, AACFAS [facility]     [address] [place], [place] [address]                Office [phone]  Fax [phone]

## 2023-06-22 ENCOUNTER — Encounter: Payer: Self-pay | Admitting: Podiatry

## 2023-06-30 ENCOUNTER — Other Ambulatory Visit: Payer: Self-pay | Admitting: Podiatry

## 2023-06-30 DIAGNOSIS — M722 Plantar fascial fibromatosis: Secondary | ICD-10-CM

## 2023-07-01 ENCOUNTER — Ambulatory Visit
Admission: RE | Admit: 2023-07-01 | Discharge: 2023-07-01 | Disposition: A | Discharge status: Home / Self Care | Payer: Self-pay | Source: Ambulatory Visit | Attending: Podiatry | Admitting: Podiatry

## 2023-07-01 DIAGNOSIS — M722 Plantar fascial fibromatosis: Secondary | ICD-10-CM

## 2023-07-04 ENCOUNTER — Other Ambulatory Visit: Payer: Self-pay | Admitting: Obstetrics & Gynecology

## 2023-07-04 DIAGNOSIS — N6489 Other specified disorders of breast: Secondary | ICD-10-CM

## 2023-07-13 ENCOUNTER — Encounter: Payer: Self-pay | Admitting: Podiatry

## 2023-07-13 ENCOUNTER — Ambulatory Visit: Payer: 59 | Admitting: Podiatry

## 2023-07-13 VITALS — Ht 62.0 in | Wt 172.0 lb

## 2023-07-13 DIAGNOSIS — M67873 Other specified disorders of tendon, right ankle and foot: Secondary | ICD-10-CM | POA: Diagnosis not present

## 2023-07-13 DIAGNOSIS — M722 Plantar fascial fibromatosis: Secondary | ICD-10-CM | POA: Diagnosis not present

## 2023-07-13 MED ORDER — DICLOFENAC SODIUM 1 % EX GEL: 2.0000 g | Freq: Four times a day (QID) | CUTANEOUS | 1 refills | Status: AC

## 2023-07-13 NOTE — Progress Notes (Signed)
 Chief Complaint  Patient presents with   Foot Pain    She is here for mri results      HPI: 48 y.o. female presenting today for follow-up evaluation right foot plantar fasciitis.  Here to review MRI.  She reports significant improvement from previous when she wears good shoes, using the brace and uses her inserts however overall the pain is still nagging.  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  Past Medical History:  Diagnosis Date   Anxiety    Depression    Eczema    H/O varicella    Hematuria    History of herpes genitalis    History of kidney stones    pt has long hx calcium phosphate stones   Hypocalcemia    Migraine    Pneumonia    once 10+ yeaers ago   Polycystic ovarian syndrome    PONV (postoperative nausea and vomiting)    Renal tubular acidosis type I    Right ureteral stone    Seasonal allergies    Urgency of urination     Past Surgical History:  Procedure Laterality Date   CYSTOSCOPY W/ RETROGRADES Right 12/12/2019   Procedure: CYSTOSCOPY WITH RIGHT RETROGRADE PYELOGRAM AND STENT PLACEMENT;  Surgeon: Heloise Purpura, MD;  [facility];  Service: Urology;  Laterality: Right;   CYSTOSCOPY W/ URETERAL STENT PLACEMENT Left 07/01/2012   Procedure: CYSTOSCOPY WITH RETROGRADE PYELOGRAM/URETERAL STENT PLACEMENT;  Surgeon: Milford Cage, MD;  [facility];  Service: Urology;  Laterality: Left;   CYSTOSCOPY W/ URETERAL STENT PLACEMENT Right 07/27/2016   Procedure: CYSTOSCOPY WITH RETROGRADE PYELOGRAM/URETERAL STENT PLACEMENT;  Surgeon: Alfredo Martinez, MD;  [facility];  Service: Urology;  Laterality: Right;   CYSTOSCOPY W/ URETERAL STENT PLACEMENT Right 05/08/2019   Procedure: CYSTOSCOPY WITH RETROGRADE PYELOGRAM/URETERAL STENT PLACEMENT;  Surgeon: Malen Gauze, MD;  Location[facility];  Service: Urology;  Laterality: Right;   CYSTOSCOPY W/ URETERAL STENT PLACEMENT Right 03/05/2021   Procedure: CYSTOURETEROSCOPY WITH  RETROGRADE PYELOGRAM/URETERAL STENT PLACEMENT RIGHT;  Surgeon: Malen Gauze, MD;  [facility];  Service: Urology;  Laterality: Right;   CYSTOSCOPY WITH RETROGRADE PYELOGRAM, URETEROSCOPY AND STENT PLACEMENT Left 07/15/2012   Procedure: CYSTOSCOPY WITH left RETROGRADE PYELOGRAM,left  URETEROSCOPY AND left STENT exchange ;  Surgeon: Milford Cage, MD;  Location: [facility];  Service: Urology;  Laterality: Left;  LEFT URETER STENT EXCHANGE     CYSTOSCOPY WITH RETROGRADE PYELOGRAM, URETEROSCOPY AND STENT PLACEMENT Right 08/11/2016   Procedure: CYSTOSCOPY WITH RIGHT RETROGRADE PYELOGRAM,RIGHT  URETEROSCOPY AND STENT CHANGE;  Surgeon: Ihor Gully, MD;  Location[facility];  Service: Urology;  Laterality: Right;   CYSTOSCOPY WITH URETEROSCOPY AND STENT PLACEMENT Right 07/12/2021   Procedure: CYSTOSCOPY WITH RIGHT URETEROSCOPY AND STENT PLACEMENT;  Surgeon: [name], MD;  [facility];  Service: Urology;  Laterality: Right;   CYSTOSCOPY/RETROGRADE/URETEROSCOPY/STONE EXTRACTION WITH BASKET  AGE 5   CYSTOSCOPY/URETEROSCOPY/HOLMIUM LASER/STENT PLACEMENT Bilateral 09/19/2018   Procedure: CYSTOSCOPY/RETROGRADE/URETEROSCOPY/HOLMIUM LASER/STENT PLACEMENT;  Surgeon: [name], MD;  Location[facility];  Service: Urology;  Laterality: Bilateral;   CYSTOSCOPY/URETEROSCOPY/HOLMIUM LASER/STENT PLACEMENT Right 10/22/2018   Procedure: CYSTOSCOPY/RETROGRADE/URETEROSCOPY/HOLMIUM LASER/STENT PLACEMENT;  Surgeon: [name], MD;  Location: [facility];  Service: Urology;  Laterality: Right;  ONLY NEEDS 60 MIN   CYSTOSCOPY/URETEROSCOPY/HOLMIUM LASER/STENT PLACEMENT Right 02/11/2019   Procedure: CYSTOSCOPY/RETROGRADE/URETEROSCOPY/HOLMIUM LASER/STENT PLACEMENT;  Surgeon: [name], MD;  Location: [facility];  Service: Urology;  Laterality: Right;   CYSTOSCOPY/URETEROSCOPY/HOLMIUM LASER/STENT PLACEMENT Bilateral  06/03/2019   Procedure: CYSTOSCOPY/RETROGRADE/URETEROSCOPY/HOLMIUM LASER/STENT PLACEMENT;  Surgeon: [name], MD;  Location: [facility];  Service: Urology;  Laterality: Bilateral;   CYSTOSCOPY/URETEROSCOPY/HOLMIUM LASER/STENT PLACEMENT Right 12/30/2019   Procedure: CYSTOSCOPY, RETROGRADE /URETEROSCOPY/HOLMIUM LASER/STENT EXCHANGE;  Surgeon: [name], MD;  Location: [facility];  Service: Urology;  Laterality: Right;   CYSTOSCOPY/URETEROSCOPY/HOLMIUM LASER/STENT PLACEMENT Right 10/17/2022   Procedure: CYSTOSCOPY/URETEROSCOPY/HOLMIUM LASER/STENT PLACEMENT/RIGHT NEPHROSTOMY TUBE REMOVAL;  Surgeon: [name], MD;  [facility];  Service: Urology;  Laterality: Right;   CYSTOSCOPY/URETEROSCOPY/HOLMIUM LASER/STENT PLACEMENT Left 12/01/2022   Procedure: CYSTOSCOPY/URETEROSCOPY/HOLMIUM LASER/STENT PLACEMENT;  Surgeon: Joline Maxcy, MD;  Location[facility];  Service: Urology;  Laterality: Left;   EXTRACORPOREAL SHOCK WAVE LITHOTRIPSY Left 08/07/2019   Procedure: EXTRACORPOREAL SHOCK WAVE LITHOTRIPSY (ESWL);  Surgeon: Noel Christmas, MD;  Location: [facility];  Service: Urology;  Laterality: Left;   EXTRACORPOREAL SHOCK WAVE LITHOTRIPSY Left 04/08/2020   Procedure: EXTRACORPOREAL SHOCK WAVE LITHOTRIPSY (ESWL);  Surgeon: Belva Agee, MD;  Location: [facility];  Service: Urology;  Laterality: Left;   EXTRACORPOREAL SHOCK WAVE LITHOTRIPSY Right 12/16/2020   Procedure: EXTRACORPOREAL SHOCK WAVE LITHOTRIPSY (ESWL);  Surgeon: Alfredo Martinez, MD;  Location: [facility];  Service: Urology;  Laterality: Right;   HOLMIUM LASER APPLICATION Left 07/15/2012   Procedure: HOLMIUM LASER APPLICATION;  Surgeon: Milford Cage, MD;  Location: [facility];  Service: Urology;  Laterality: Left;   HOLMIUM LASER APPLICATION Right 08/11/2016   Procedure: HOLMIUM LASER APPLICATION;  Surgeon: Ihor Gully, MD;  Location: [facility];  Service: Urology;  Laterality: Right;   HOLMIUM LASER APPLICATION Right 02/11/2019   Procedure: HOLMIUM LASER APPLICATION;  Surgeon: [name], MD;  Location: [facility];  Service: Urology;  Laterality: Right;   IR NEPHROSTOMY PLACEMENT RIGHT  10/16/2022   TONSILLECTOMY  AS CHILD   URETEROSCOPY      Allergies  Allergen Reactions   Adhesive [Tape] Rash    Pt states paper tape causes rash, but "plastic tape" is not problematic.      Physical Exam: General: The patient is alert and oriented x3 in no acute distress.  Dermatology:  No ecchymosis, erythema, or edema bilateral.  No open lesions.    Vascular: Palpable pedal pulses bilaterally. Capillary refill within normal limits.  No appreciable edema.    Neurological: Light touch sensation intact bilateral.  MMT 5/5 to lower extremity bilateral. Negative Tinel's sign with percussion of the posterior tibial nerve on the affected extremity.    Musculoskeletal Exam:  There is pain on palpation of the plantarmedial & plantarcentral aspect of right heel.  No gaps or nodules within the plantar fascia today.  Antalgic gait noted with first few steps upon standing.  No pain on palpation of achilles tendon bilateral.  Ankle df less than 10 degrees with knee extended b/l. Early heel rise on gait, patient does report hamstring tightness.  Radiographic Exam: Right foot 04/27/23 Normal osseous mineralization. Joint spaces preserved.  No fractures or osseous irregularities noted.  MRI right foot: IMPRESSION: 1. Mild distal Achilles tendinosis. 2. Mild plantar fasciitis of the medial band of the plantar fascia at the calcaneal insertion.  Assessment/Plan of Care: 1. Plantar fascial fibromatosis   2. Achilles tendinosis of right ankle     Meds ordered this encounter  Medications   diclofenac Sodium (VOLTAREN) 1 % GEL    Sig: Apply  2 g topically 4 (four) times daily.     Dispense:  150 g    Refill:  1   AMB REFERRAL TO PHYSICAL THERAPY  MRI reviewed with patient  -Reviewed etiology of plantar fasciitis with patient.  Discussed treatment options with patient today, including cortisone injection, NSAID course of treatment, stretching exercises, physical therapy, use of night splint, rest, icing the heel, arch supports/orthotics, and supportive shoe gear.    Overall she is in a good place regarding pain when she consistently uses good shoes, her inserts and her plantar fascial brace.  Holding off on prescribing more oral medication for now  She really wants to avoid surgery. PT Referral placed this should also be beneficial for findings of Achilles tendinosis.  Continue home stretching regimen in the interim.  Diclofenac gel prescribed to the patient which may be thoroughly massaged into the heel 3- 4 times a day  Return in about 6 weeks (around 08/24/2023).   Bronwen Betters, DPM, AACFAS [facility]     [address] [place], [place] [address]                Office [phone]  Fax [phone]

## 2023-07-15 IMAGING — CT CT RENAL STONE PROTOCOL
2 of 4 series · 16 of 46 positions shown, 18 images · non-contrast
Comparison: CT November 14, 2020

CLINICAL DATA: Right flank pain history of renal stones

EXAM:
CT ABDOMEN AND PELVIS WITHOUT CONTRAST
TECHNIQUE: Multidetector CT imaging of the abdomen and pelvis was performed
following the standard protocol without IV contrast.

[Series 2: axial st · axial · 0.80mm/px · z∈[-447,-37]mm · 13 of 94 slices shown, 15 images]
[im 6/94  soft-tissue]
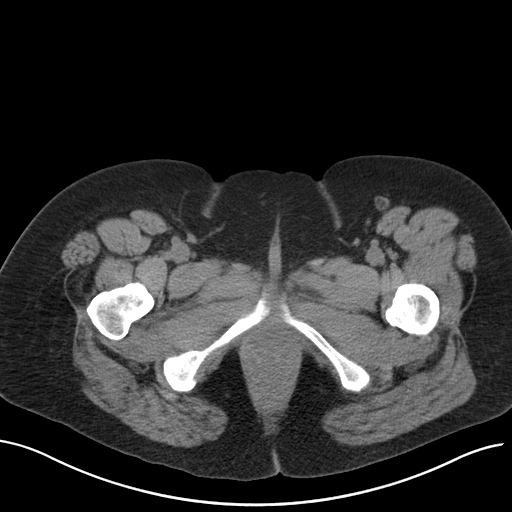
[im 6/94  bone]
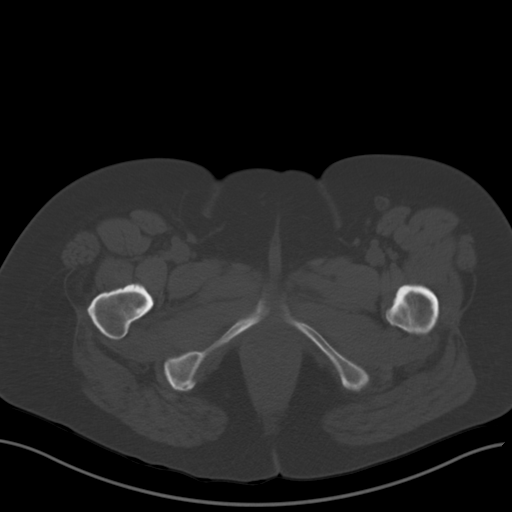
[im 11/94  soft-tissue]
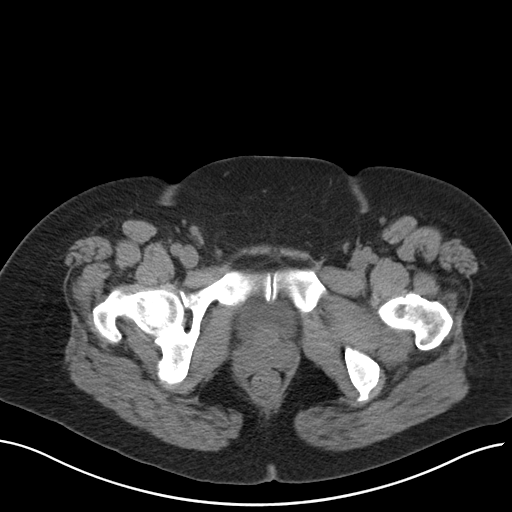
[im 21/94  soft-tissue]
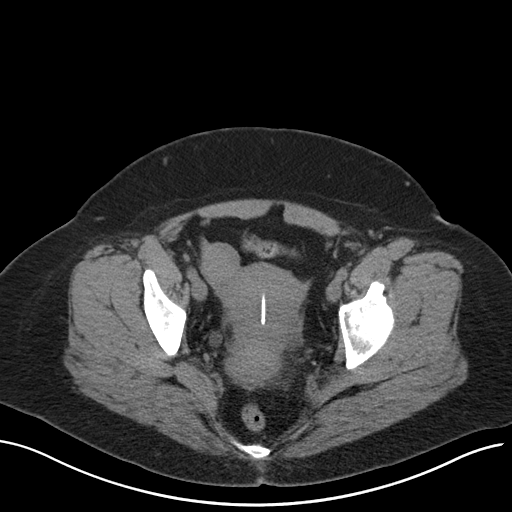
[im 26/94  soft-tissue]
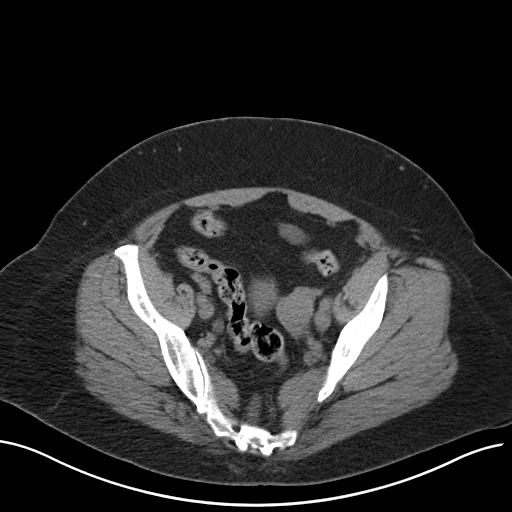
[im 32/94  soft-tissue]
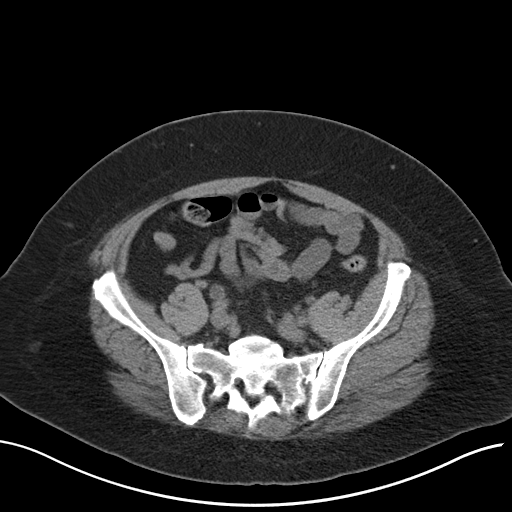
[im 42/94  soft-tissue]
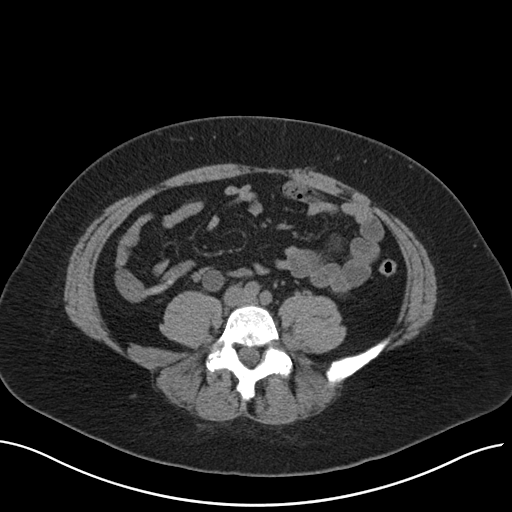
[im 47/94  soft-tissue]
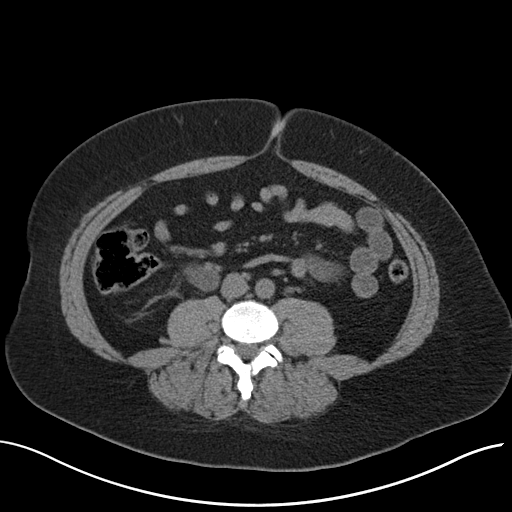
[im 52/94  soft-tissue]
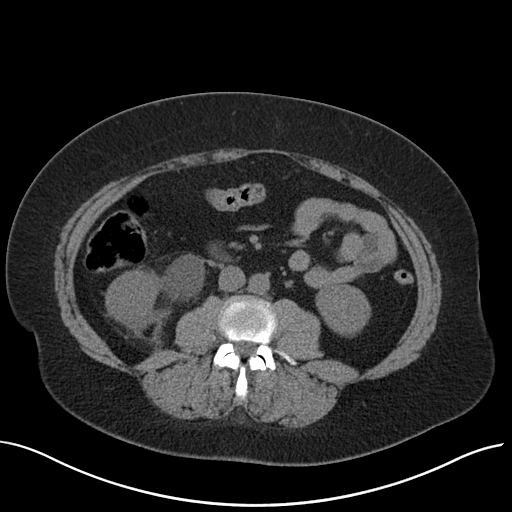
[im 63/94  soft-tissue]
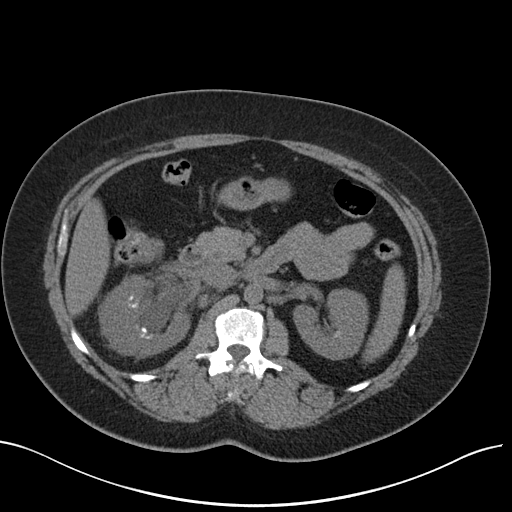
[im 63/94  bone]
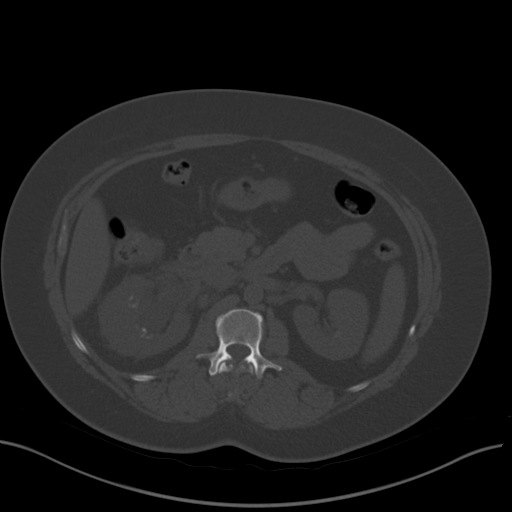
[im 68/94  soft-tissue]
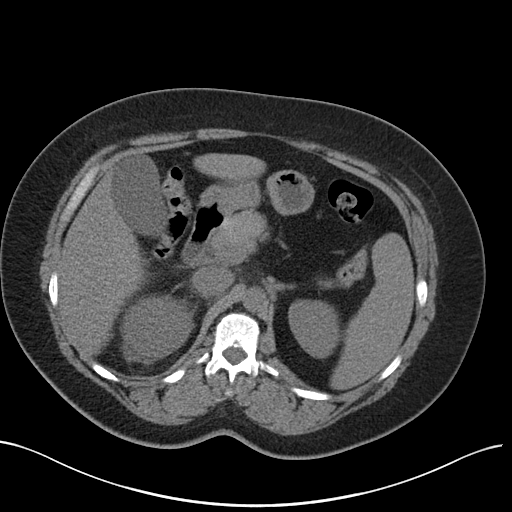
[im 73/94  soft-tissue]
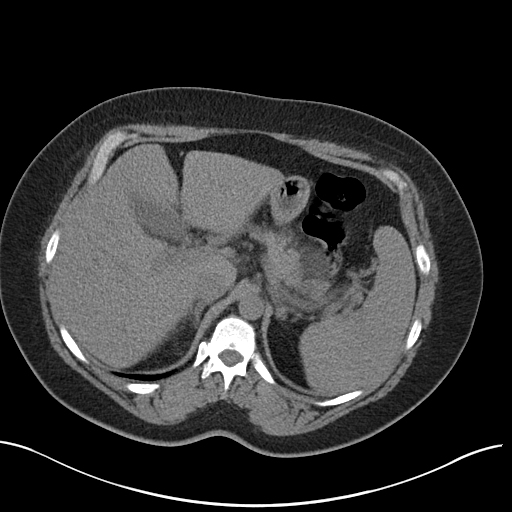
[im 83/94  soft-tissue]
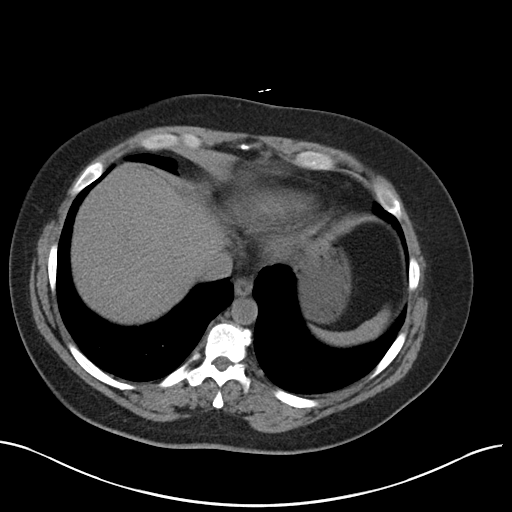
[im 88/94  soft-tissue]
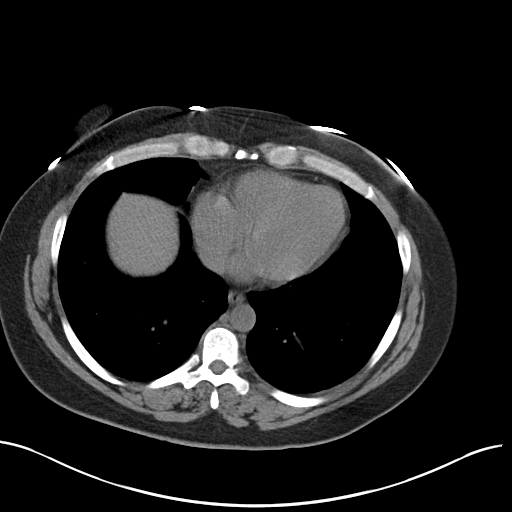

[Series 5: coronal · coronal · 0.79mm/px · 3 of 160 slices shown]
[im 54/160  soft-tissue]
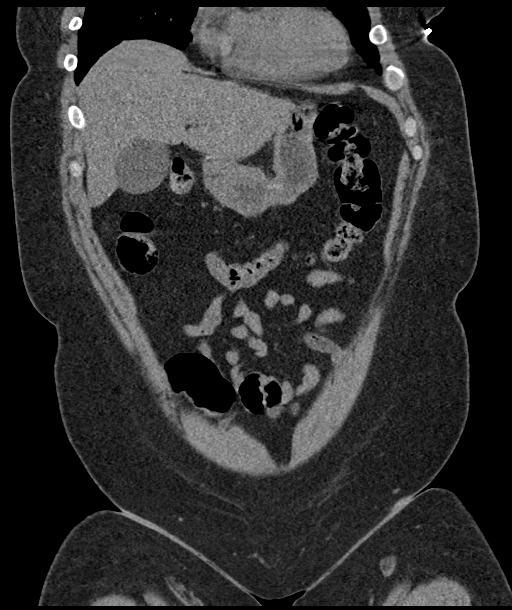
[im 71/160  soft-tissue]
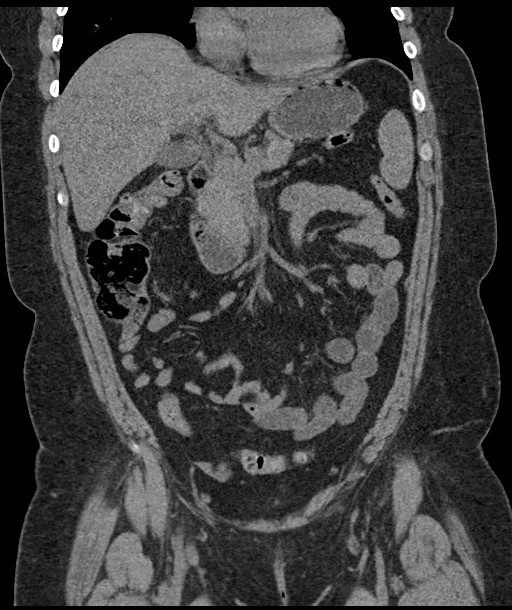
[im 89/160  soft-tissue]
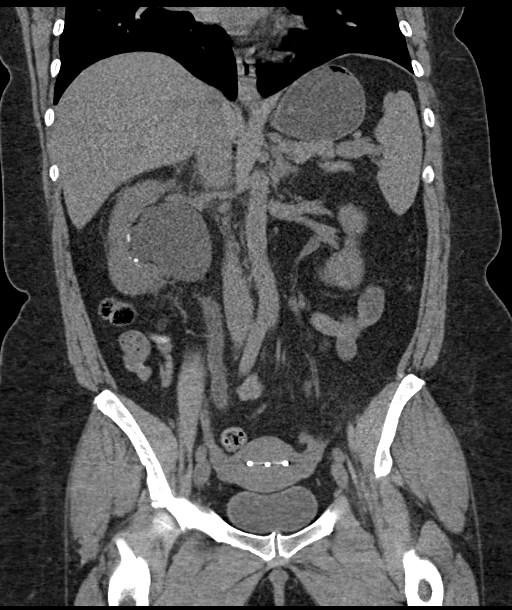

[16 of 46 positions shown; findings below may reference images not displayed]

FINDINGS: Lower chest: No acute abnormality.

Hepatobiliary: Unremarkable noncontrast appearance of the hepatic
parenchyma. Gallbladder is unremarkable. No biliary ductal dilation.

Pancreas: No pancreatic ductal dilation or evidence of acute
inflammation.

Spleen: Within normal limits.

Adrenals/Urinary Tract: Bilateral adrenal glands are unremarkable.

Increased severe right-sided hydroureteronephrosis to the level of a
6 mm stone at the ureterovesicular junction now with new perinephric
and periureteric stranding.

No left-sided hydronephrosis. Additional bilateral nonobstructive
nephrolithiasis.

Stomach/Bowel: No enteric contrast was administered. Stomach is
unremarkable for degree of distension. No pathologic dilation of
small or large bowel. The appendix and terminal ileum appear normal.
No evidence of acute bowel inflammation.

Vascular/Lymphatic: No abdominal aortic aneurysm. No pathologically
enlarged abdominal or pelvic lymph nodes.

Reproductive: Intrauterine device appears appropriate in
positioning. Right adnexa is unremarkable. 2.2 cm left ovarian cyst.
2.2 cm left ovarian simple-appearing cyst. No follow-up imaging is
recommended. Reference: JACR [DATE]):[id number]

Other: No significant abdominopelvic ascites.

Musculoskeletal: Thoracolumbar spondylosis. No acute osseous
abnormality.
IMPRESSION: 1. Increased severe right-sided hydroureteronephrosis to the level
of an obstructive 6 mm stone at the right ureterovesicular junction
now with new perinephric and periureteric stranding. Correlation
with urinalysis for superimposed infection is suggested.
2. Additional bilateral nonobstructive nephrolithiasis.

## 2023-07-16 ENCOUNTER — Encounter: Payer: Self-pay | Admitting: Podiatry

## 2023-07-16 NOTE — Patient Instructions (Signed)

## 2023-07-19 ENCOUNTER — Ambulatory Visit
Admission: RE | Admit: 2023-07-19 | Discharge: 2023-07-19 | Disposition: A | Discharge status: Home / Self Care | Source: Ambulatory Visit | Attending: Obstetrics & Gynecology | Admitting: Obstetrics & Gynecology

## 2023-07-19 DIAGNOSIS — N6489 Other specified disorders of breast: Secondary | ICD-10-CM

## 2023-07-25 ENCOUNTER — Encounter: Payer: Self-pay | Admitting: Physical Therapy

## 2023-07-25 ENCOUNTER — Ambulatory Visit: Attending: Podiatry | Admitting: Physical Therapy

## 2023-07-25 ENCOUNTER — Other Ambulatory Visit: Payer: Self-pay

## 2023-07-25 DIAGNOSIS — M79671 Pain in right foot: Secondary | ICD-10-CM | POA: Insufficient documentation

## 2023-07-25 DIAGNOSIS — M722 Plantar fascial fibromatosis: Secondary | ICD-10-CM | POA: Diagnosis not present

## 2023-07-25 DIAGNOSIS — M79672 Pain in left foot: Secondary | ICD-10-CM | POA: Diagnosis present

## 2023-07-25 DIAGNOSIS — M6281 Muscle weakness (generalized): Secondary | ICD-10-CM | POA: Diagnosis present

## 2023-07-25 NOTE — Therapy (Unsigned)
 OUTPATIENT PHYSICAL THERAPY LOWER EXTREMITY EVALUATION  Patient Name: Phyllis House MRN: [id number] DOB:1975-05-06, 49 y.o., female Today's Date: 07/27/2023   PT End of Session - 07/27/23 0731     Visit Number 1    Number of Visits --   1-2x/week   Date for PT Re-Evaluation 09/21/23    Authorization Type [employer] - LEFS    PT Start Time 0545    PT Stop Time 0625    PT Time Calculation (min) 40 min             Past Medical History:  Diagnosis Date   Anxiety    Depression    Eczema    H/O varicella    Hematuria    History of herpes genitalis    History of kidney stones    pt has long hx calcium phosphate stones   Hypocalcemia    Migraine    Pneumonia    once 10+ yeaers ago   Polycystic ovarian syndrome    PONV (postoperative nausea and vomiting)    Renal tubular acidosis type I    Right ureteral stone    Seasonal allergies    Urgency of urination    Past Surgical History:  Procedure Laterality Date   CYSTOSCOPY W/ RETROGRADES Right 12/12/2019   Procedure: CYSTOSCOPY WITH RIGHT RETROGRADE PYELOGRAM AND STENT PLACEMENT;  Surgeon: Heloise Purpura, MD;  Location: [facility];  Service: Urology;  Laterality: Right;   CYSTOSCOPY W/ URETERAL STENT PLACEMENT Left 07/01/2012   Procedure: CYSTOSCOPY WITH RETROGRADE PYELOGRAM/URETERAL STENT PLACEMENT;  Surgeon: Milford Cage, MD;  Location: [facility];  Service: Urology;  Laterality: Left;   CYSTOSCOPY W/ URETERAL STENT PLACEMENT Right 07/27/2016   Procedure: CYSTOSCOPY WITH RETROGRADE PYELOGRAM/URETERAL STENT PLACEMENT;  Surgeon: Alfredo Martinez, MD;  Location: [facility];  Service: Urology;  Laterality: Right;   CYSTOSCOPY W/ URETERAL STENT PLACEMENT Right 05/08/2019   Procedure: CYSTOSCOPY WITH RETROGRADE PYELOGRAM/URETERAL STENT PLACEMENT;  Surgeon: Malen Gauze, MD;  Location: [facility];  Service: Urology;  Laterality: Right;   CYSTOSCOPY W/ URETERAL STENT PLACEMENT Right 03/05/2021   Procedure: CYSTOURETEROSCOPY WITH  RETROGRADE PYELOGRAM/URETERAL STENT PLACEMENT RIGHT;  Surgeon: Malen Gauze, MD;  Location: [facility];  Service: Urology;  Laterality: Right;   CYSTOSCOPY WITH RETROGRADE PYELOGRAM, URETEROSCOPY AND STENT PLACEMENT Left 07/15/2012   Procedure: CYSTOSCOPY WITH left RETROGRADE PYELOGRAM,left  URETEROSCOPY AND left STENT exchange ;  Surgeon: Milford Cage, MD;  Location: [facility];  Service: Urology;  Laterality: Left;  LEFT URETER STENT EXCHANGE     CYSTOSCOPY WITH RETROGRADE PYELOGRAM, URETEROSCOPY AND STENT PLACEMENT Right 08/11/2016   Procedure: CYSTOSCOPY WITH RIGHT RETROGRADE PYELOGRAM,RIGHT  URETEROSCOPY AND STENT CHANGE;  Surgeon: Ihor Gully, MD;  Location: [facility];  Service: Urology;  Laterality: Right;   CYSTOSCOPY WITH URETEROSCOPY AND STENT PLACEMENT Right 07/12/2021   Procedure: CYSTOSCOPY WITH RIGHT URETEROSCOPY AND STENT PLACEMENT;  Surgeon: [name], MD;  Location: [facility];  Service: Urology;  Laterality: Right;   CYSTOSCOPY/RETROGRADE/URETEROSCOPY/STONE EXTRACTION WITH BASKET  AGE 18   CYSTOSCOPY/URETEROSCOPY/HOLMIUM LASER/STENT PLACEMENT Bilateral 09/19/2018   Procedure: CYSTOSCOPY/RETROGRADE/URETEROSCOPY/HOLMIUM LASER/STENT PLACEMENT;  Surgeon: [name], MD;  Location: [facility];  Service: Urology;  Laterality: Bilateral;   CYSTOSCOPY/URETEROSCOPY/HOLMIUM LASER/STENT PLACEMENT Right 10/22/2018   Procedure: CYSTOSCOPY/RETROGRADE/URETEROSCOPY/HOLMIUM LASER/STENT PLACEMENT;  Surgeon: [name], MD;  Location: [facility];  Service: Urology;  Laterality: Right;  ONLY NEEDS 60 MIN   CYSTOSCOPY/URETEROSCOPY/HOLMIUM LASER/STENT PLACEMENT Right 02/11/2019   Procedure: CYSTOSCOPY/RETROGRADE/URETEROSCOPY/HOLMIUM LASER/STENT PLACEMENT;  Surgeon: [name],  MD;  Location: [facility];  Service: Urology;  Laterality: Right;   CYSTOSCOPY/URETEROSCOPY/HOLMIUM LASER/STENT PLACEMENT Bilateral  06/03/2019   Procedure: CYSTOSCOPY/RETROGRADE/URETEROSCOPY/HOLMIUM LASER/STENT PLACEMENT;  Surgeon: [name], MD;  Location: [facility];  Service: Urology;  Laterality: Bilateral;   CYSTOSCOPY/URETEROSCOPY/HOLMIUM LASER/STENT PLACEMENT Right 12/30/2019   Procedure: CYSTOSCOPY, RETROGRADE /URETEROSCOPY/HOLMIUM LASER/STENT EXCHANGE;  Surgeon: [name], MD;  Location: [facility];  Service: Urology;  Laterality: Right;   CYSTOSCOPY/URETEROSCOPY/HOLMIUM LASER/STENT PLACEMENT Right 10/17/2022   Procedure: CYSTOSCOPY/URETEROSCOPY/HOLMIUM LASER/STENT PLACEMENT/RIGHT NEPHROSTOMY TUBE REMOVAL;  Surgeon: [name], MD;  Location: [facility];  Service: Urology;  Laterality: Right;   CYSTOSCOPY/URETEROSCOPY/HOLMIUM LASER/STENT PLACEMENT Left 12/01/2022   Procedure: CYSTOSCOPY/URETEROSCOPY/HOLMIUM LASER/STENT PLACEMENT;  Surgeon: Joline Maxcy, MD;  Location: [facility];  Service: Urology;  Laterality: Left;   EXTRACORPOREAL SHOCK WAVE LITHOTRIPSY Left 08/07/2019   Procedure: EXTRACORPOREAL SHOCK WAVE LITHOTRIPSY (ESWL);  Surgeon: Noel Christmas, MD;  Location: [facility] [place];  Service: Urology;  Laterality: Left;   EXTRACORPOREAL SHOCK WAVE LITHOTRIPSY Left 04/08/2020   Procedure: EXTRACORPOREAL SHOCK WAVE LITHOTRIPSY (ESWL);  Surgeon: Belva Agee, MD;  Location: [facility];  Service: Urology;  Laterality: Left;   EXTRACORPOREAL SHOCK WAVE LITHOTRIPSY Right 12/16/2020   Procedure: EXTRACORPOREAL SHOCK WAVE LITHOTRIPSY (ESWL);  Surgeon: Alfredo Martinez, MD;  Location: [facility];  Service: Urology;  Laterality: Right;   HOLMIUM LASER APPLICATION Left 07/15/2012   Procedure: HOLMIUM LASER APPLICATION;  Surgeon: Milford Cage, MD;  Location: [facility];  Service: Urology;  Laterality: Left;   HOLMIUM LASER APPLICATION Right 08/11/2016   Procedure: HOLMIUM LASER APPLICATION;  Surgeon: Ihor Gully, MD;  Location: [facility];  Service: Urology;  Laterality: Right;   HOLMIUM LASER APPLICATION Right 02/11/2019   Procedure: HOLMIUM LASER APPLICATION;  Surgeon: [name], MD;  Location: [facility];  Service: Urology;  Laterality: Right;   IR NEPHROSTOMY PLACEMENT RIGHT  10/16/2022   TONSILLECTOMY  AS CHILD   URETEROSCOPY     Patient Active Problem List   Diagnosis Date Noted   Hydronephrosis 10/15/2022   Ureteral stone with hydronephrosis 03/05/2021   Anxiety 10/06/2020   Obesity 10/06/2020   Ureteral calculus 12/12/2019   Renal tubular acidosis type I 06/24/2019   Hydronephrosis concurrent with and due to calculi of kidney and ureter 05/08/2019   Migraine without aura and without status migrainosus, not intractable 11/19/2013   Other malaise and fatigue 03/14/2013   Hypokalemia 12/26/2012   Infection of urinary tract 07/02/2012   Ureteral calculus, left 07/01/2012   Fever, unspecified 07/01/2012    PCP: Ollen Bowl, MD  REFERRING PROVIDER: Barbaraann Share, DPM  THERAPY DIAG:  Pain in right foot - Plan: PT plan of care cert/re-cert  Pain in left foot - Plan: PT plan of care cert/re-cert  Muscle weakness - Plan: PT plan of care cert/re-cert  REFERRING DIAG: 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 No, Number of falls: 0  MOI/History of condition:  Onset date: Re-occuring - about 8 months this bout  SUBJECTIVE STATEMENT  Phyllis House is a 48 y.o. female who presents to clinic with chief complaint of R plantar fascia pain with some pain now staring in L.  She has had bouts  of PF for years.  This round started about 8 months ago.  She was starting to run and felt a pop in the bottom of her foot and her pain  has been very severe since.  It is more sore after sitting or first thing in the morning but she feels that her current pain is different than the PF pain she has had in the past.  It encompass her entire heal.  It feels better after resting for a weekend.  She has been wearing orthotics which has helped.  She was in a boot and has tried several rounds of injections where were minimally helpful.   Red flags:  denies   Pain:  Are you having pain? 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 Mild distal Achilles tendinosis. 2. Mild plantar fasciitis of the medial band of the plantar fascia at the calcaneal insertion.  GENERAL OBSERVATION/GAIT:    SENSATION: Light touch: Appears intact  PALPATION: Significant TTP near calcaneal insertion    LE MMT:  MMT Right (Eval) Left (Eval)  Hip flexion (L2, L3) 4+ 4+  Knee extension (L3) 4+ 4+  Knee flexion 4+ 4+  Hip abduction 4+ 4+  Hip extension    Hip external rotation    Hip internal rotation    Hip adduction    Ankle dorsiflexion (L4) N but painful N  Ankle plantarflexion (S1) n n  Ankle inversion n n  Ankle eversion n n  Great Toe ext (L5)    Grossly     (Blank rows = not tested, score listed is out of 5 possible points.  N = WNL, D = diminished, C = clear for gross weakness with myotome testing, * = concordant pain with testing)  LE ROM:  ROM Right (Eval) Left (Eval)  Hip flexion    Hip extension    Hip abduction    Hip adduction    Hip internal rotation    Hip external rotation     Knee extension    Knee flexion    Ankle dorsiflexion CC > 40 CC > 40  Ankle plantarflexion n n  Ankle inversion n n  Ankle eversion n n   (Blank rows = not tested, N = WNL, * = concordant pain with testing)  Functional Tests  Eval        SL heel raise: R limited by pain                                                     TODAY'S TREATMENT: Therapeutic Exercise: Creating, reviewing, and completing below HEP   PATIENT EDUCATION:  POC, diagnosis, prognosis, HEP, and outcome measures.  Pt educated via explanation, demonstration, and handout (HEP).  Pt confirms understanding verbally.  Discussion on graded exposure and progressive overload.  HOME EXERCISE PROGRAM: Access Code: [email or web address] URL: [email or web address] Date: 07/25/2023 Prepared by: Alphonzo Severance  Exercises - Ankle and Toe  Plantarflexion with Resistance  - 1 x daily - 7 x weekly - 3 reps - 1 minute hold - Seated Toe Towel Scrunches  - 1 x daily - 7 x weekly - 3x to fatigue  Treatment priorities   Eval        Progressive loading as tolerated                                          ASSESSMENT:  CLINICAL IMPRESSION: Phyllis House is a 48 y.o. female who presents to clinic with signs and sxs consistent with proximal R>L plantar pain.  MRI shows mild inflammation but no significant structural abnormalities.  Her strength and ankle ROM is excellent with exception of R heel raise which is limited by pain.  Considering she has had little benefit from rest and injections I suggested we try to gradually load.  She will start out with isometrics and foot intrinsic endurance work.  Pt will benefit from skilled therapy to address relevant deficits and improve comfort in daily activities.   OBJECTIVE IMPAIRMENTS: Pain, plantar flexion strength  ACTIVITY LIMITATIONS: walking, standing, squatting, housework, work  PERSONAL FACTORS: See medical history and pertinent history   REHAB POTENTIAL:  Good  CLINICAL DECISION MAKING: Stable/uncomplicated  EVALUATION COMPLEXITY: Moderate   GOALS:   SHORT TERM GOALS: Target date: 08/24/2023   Phyllis House will be >75% HEP compliant to improve carryover between sessions and facilitate independent management of condition  Evaluation: ongoing Goal status: INITIAL   LONG TERM GOALS: Target date: 09/21/2023   Phyllis House will self report >/= 50% decrease in pain from evaluation to improve function in daily tasks  Evaluation/Baseline: 7/10 max pain Goal status: INITIAL   2.  Phyllis House will be able to work, not limited by pain  Evaluation/Baseline: limited in standing at work Goal status: INITIAL   3.  Phyllis House will report confidence in self management of condition at time of discharge with advanced HEP  Evaluation/Baseline: unable to self manage Goal status: INITIAL  4.  Phyllis House will be able to complete 5x R SL heel raise, not limited by pain  Evaluation/Baseline: limited Goal status: INITIAL   PLAN: PT FREQUENCY: 1-2x/week  PT DURATION: 8 weeks  PLANNED INTERVENTIONS:  [id number]- PT Re-evaluation, 97110-Therapeutic exercises, 97530- Therapeutic activity, 97112- Neuromuscular re-education, 97535- Self Care, 16109- Manual therapy, [id number]- Gait training, [id number]- Aquatic Therapy, [id number]- Electrical stimulation (manual), [id number]- Vasopneumatic device, [id number]- Traction (mechanical), [id number]- Ionotophoresis 4mg /ml Dexamethasone, Taping, Dry Needling, Joint manipulation, and Spinal manipulation.   Alphonzo Severance PT, DPT 07/27/2023, 8:15 AM

## 2023-08-14 ENCOUNTER — Ambulatory Visit: Attending: Podiatry | Admitting: Physical Therapy

## 2023-08-14 ENCOUNTER — Encounter: Payer: Self-pay | Admitting: Physical Therapy

## 2023-08-14 DIAGNOSIS — M6281 Muscle weakness (generalized): Secondary | ICD-10-CM | POA: Diagnosis present

## 2023-08-14 DIAGNOSIS — M79672 Pain in left foot: Secondary | ICD-10-CM | POA: Insufficient documentation

## 2023-08-14 DIAGNOSIS — M79671 Pain in right foot: Secondary | ICD-10-CM | POA: Diagnosis present

## 2023-08-14 NOTE — Therapy (Unsigned)
 OUTPATIENT PHYSICAL THERAPY DAILY NOTE  Patient Name: Phyllis House MRN: [id number] DOB:1976/03/07, 48 y.o., female Today's Date: 08/15/2023   PT End of Session - 08/14/23 1633     Visit Number 2    Number of Visits --   1-2x/week   Date for PT Re-Evaluation 09/21/23    Authorization Type [employer] - LEFS    PT Start Time 1633    PT Stop Time 1713    PT Time Calculation (min) 40 min              Past Medical History:  Diagnosis Date   Anxiety    Depression    Eczema    H/O varicella    Hematuria    History of herpes genitalis    History of kidney stones    pt has long hx calcium phosphate stones   Hypocalcemia    Migraine    Pneumonia    once 10+ yeaers ago   Polycystic ovarian syndrome    PONV (postoperative nausea and vomiting)    Renal tubular acidosis type I    Right ureteral stone    Seasonal allergies    Urgency of urination    Past Surgical History:  Procedure Laterality Date   CYSTOSCOPY W/ RETROGRADES Right 12/12/2019   Procedure: CYSTOSCOPY WITH RIGHT RETROGRADE PYELOGRAM AND STENT PLACEMENT;  Surgeon: Florencio Hunting, MD;  Location: [facility];  Service: Urology;  Laterality: Right;   CYSTOSCOPY W/ URETERAL STENT PLACEMENT Left 07/01/2012   Procedure: CYSTOSCOPY WITH RETROGRADE PYELOGRAM/URETERAL STENT PLACEMENT;  Surgeon: Soledad Dupes, MD;  Location: [facility];  Service: Urology;  Laterality: Left;   CYSTOSCOPY W/ URETERAL STENT PLACEMENT Right 07/27/2016   Procedure: CYSTOSCOPY WITH RETROGRADE PYELOGRAM/URETERAL STENT PLACEMENT;  Surgeon: Erman Hayward, MD;  Location: [facility];  Service: Urology;  Laterality: Right;   CYSTOSCOPY W/ URETERAL STENT PLACEMENT Right 05/08/2019   Procedure: CYSTOSCOPY WITH RETROGRADE PYELOGRAM/URETERAL STENT PLACEMENT;  Surgeon: Marco Severs, MD;  Location: [facility];  Service: Urology;  Laterality: Right;   CYSTOSCOPY W/ URETERAL STENT PLACEMENT Right 03/05/2021   Procedure: CYSTOURETEROSCOPY WITH RETROGRADE  PYELOGRAM/URETERAL STENT PLACEMENT RIGHT;  Surgeon: Marco Severs, MD;  Location: [facility];  Service: Urology;  Laterality: Right;   CYSTOSCOPY WITH RETROGRADE PYELOGRAM, URETEROSCOPY AND STENT PLACEMENT Left 07/15/2012   Procedure: CYSTOSCOPY WITH left RETROGRADE PYELOGRAM,left  URETEROSCOPY AND left STENT exchange ;  Surgeon: Soledad Dupes, MD;  Location: [facility];  Service: Urology;  Laterality: Left;  LEFT URETER STENT EXCHANGE     CYSTOSCOPY WITH RETROGRADE PYELOGRAM, URETEROSCOPY AND STENT PLACEMENT Right 08/11/2016   Procedure: CYSTOSCOPY WITH RIGHT RETROGRADE PYELOGRAM,RIGHT  URETEROSCOPY AND STENT CHANGE;  Surgeon: Mark Ottelin, MD;  Location: [facility];  Service: Urology;  Laterality: Right;   CYSTOSCOPY WITH URETEROSCOPY AND STENT PLACEMENT Right 07/12/2021   Procedure: CYSTOSCOPY WITH RIGHT URETEROSCOPY AND STENT PLACEMENT;  Surgeon: Christina Coyer, MD;  Location: [facility];  Service: Urology;  Laterality: Right;   CYSTOSCOPY/RETROGRADE/URETEROSCOPY/STONE EXTRACTION WITH BASKET  AGE 63   CYSTOSCOPY/URETEROSCOPY/HOLMIUM LASER/STENT PLACEMENT Bilateral 09/19/2018   Procedure: CYSTOSCOPY/RETROGRADE/URETEROSCOPY/HOLMIUM LASER/STENT PLACEMENT;  Surgeon: Christina Coyer, MD;  Location: [facility];  Service: Urology;  Laterality: Bilateral;   CYSTOSCOPY/URETEROSCOPY/HOLMIUM LASER/STENT PLACEMENT Right 10/22/2018   Procedure: CYSTOSCOPY/RETROGRADE/URETEROSCOPY/HOLMIUM LASER/STENT PLACEMENT;  Surgeon: Christina Coyer, MD;  Location: [facility];  Service: Urology;  Laterality: Right;  ONLY NEEDS 60 MIN   CYSTOSCOPY/URETEROSCOPY/HOLMIUM LASER/STENT PLACEMENT Right 02/11/2019   Procedure: CYSTOSCOPY/RETROGRADE/URETEROSCOPY/HOLMIUM LASER/STENT PLACEMENT;  Surgeon: Christina Coyer,  MD;  Location: [facility];  Service: Urology;  Laterality: Right;   CYSTOSCOPY/URETEROSCOPY/HOLMIUM LASER/STENT PLACEMENT Bilateral 06/03/2019    Procedure: CYSTOSCOPY/RETROGRADE/URETEROSCOPY/HOLMIUM LASER/STENT PLACEMENT;  Surgeon: Christina Coyer, MD;  Location: [facility];  Service: Urology;  Laterality: Bilateral;   CYSTOSCOPY/URETEROSCOPY/HOLMIUM LASER/STENT PLACEMENT Right 12/30/2019   Procedure: CYSTOSCOPY, RETROGRADE /URETEROSCOPY/HOLMIUM LASER/STENT EXCHANGE;  Surgeon: Christina Coyer, MD;  Location: [facility];  Service: Urology;  Laterality: Right;   CYSTOSCOPY/URETEROSCOPY/HOLMIUM LASER/STENT PLACEMENT Right 10/17/2022   Procedure: CYSTOSCOPY/URETEROSCOPY/HOLMIUM LASER/STENT PLACEMENT/RIGHT NEPHROSTOMY TUBE REMOVAL;  Surgeon: Christina Coyer, MD;  Location: [facility];  Service: Urology;  Laterality: Right;   CYSTOSCOPY/URETEROSCOPY/HOLMIUM LASER/STENT PLACEMENT Left 12/01/2022   Procedure: CYSTOSCOPY/URETEROSCOPY/HOLMIUM LASER/STENT PLACEMENT;  Surgeon: Scarlet Curly, MD;  Location: [facility];  Service: Urology;  Laterality: Left;   EXTRACORPOREAL SHOCK WAVE LITHOTRIPSY Left 08/07/2019   Procedure: EXTRACORPOREAL SHOCK WAVE LITHOTRIPSY (ESWL);  Surgeon: Roxane Copp, MD;  Location: [facility];  Service: Urology;  Laterality: Left;   EXTRACORPOREAL SHOCK WAVE LITHOTRIPSY Left 04/08/2020   Procedure: EXTRACORPOREAL SHOCK WAVE LITHOTRIPSY (ESWL);  Surgeon: Sherlyn Ditto, MD;  Location: [facility];  Service: Urology;  Laterality: Left;   EXTRACORPOREAL SHOCK WAVE LITHOTRIPSY Right 12/16/2020   Procedure: EXTRACORPOREAL SHOCK WAVE LITHOTRIPSY (ESWL);  Surgeon: Erman Hayward, MD;  Location: [facility];  Service: Urology;  Laterality: Right;   HOLMIUM LASER APPLICATION Left 07/15/2012   Procedure: HOLMIUM LASER APPLICATION;  Surgeon: Soledad Dupes, MD;  Location: [facility];  Service: Urology;  Laterality: Left;   HOLMIUM LASER APPLICATION Right 08/11/2016   Procedure: HOLMIUM LASER APPLICATION;  Surgeon: Mark Ottelin,  MD;  Location: [facility];  Service: Urology;  Laterality: Right;   HOLMIUM LASER APPLICATION Right 02/11/2019   Procedure: HOLMIUM LASER APPLICATION;  Surgeon: Christina Coyer, MD;  Location: [facility];  Service: Urology;  Laterality: Right;   IR NEPHROSTOMY PLACEMENT RIGHT  10/16/2022   TONSILLECTOMY  AS CHILD   URETEROSCOPY     Patient Active Problem List   Diagnosis Date Noted   Hydronephrosis 10/15/2022   Ureteral stone with hydronephrosis 03/05/2021   Anxiety 10/06/2020   Obesity 10/06/2020   Ureteral calculus 12/12/2019   Renal tubular acidosis type I 06/24/2019   Hydronephrosis concurrent with and due to calculi of kidney and ureter 05/08/2019   Migraine without aura and without status migrainosus, not intractable 11/19/2013   Other malaise and fatigue 03/14/2013   Hypokalemia 12/26/2012   Infection of urinary tract 07/02/2012   Ureteral calculus, left 07/01/2012   Fever, unspecified 07/01/2012    PCP: Elester Grim, MD  REFERRING PROVIDER: Reina Cara, DPM  THERAPY DIAG:  Pain in right foot  Pain in left foot  Muscle weakness  REFERRING DIAG: 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 No, Number of falls: 0  MOI/History of condition:  Onset date: Re-occuring - about 8 months this bout  SUBJECTIVE STATEMENT  08/15/2023:  Pt reports that she has made some progress.  She has new orthotics.  She feels her pain duration is reduced.   EVAL:  Phyllis House is a 48 y.o. female who presents to clinic with chief complaint of R plantar fascia pain with some pain now staring in  L.  She has had bouts of PF for years.  This round started about 8 months ago.  She was starting to run and felt a pop in the bottom of her foot  and her pain has been very severe since.  It is more sore after sitting or first thing in the morning but she feels that her current pain is different than the PF pain she has had in the past.  It encompass her entire heal.  It feels better after resting for a weekend.  She has been wearing orthotics which has helped.  She was in a boot and has tried several rounds of injections where were minimally helpful.   Red flags:  denies   Pain:  Are you having pain? 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 Mild distal Achilles tendinosis. 2. Mild plantar fasciitis of the medial band of the plantar fascia at the calcaneal insertion.  GENERAL OBSERVATION/GAIT:    SENSATION: Light touch: Appears intact  PALPATION: Significant TTP near calcaneal insertion    LE MMT:  MMT Right (Eval) Left (Eval)  Hip flexion (L2, L3) 4+ 4+  Knee extension (L3) 4+ 4+  Knee flexion 4+ 4+  Hip abduction 4+ 4+  Hip extension    Hip external rotation    Hip internal rotation    Hip adduction    Ankle dorsiflexion (L4) N but painful N  Ankle plantarflexion (S1) n n  Ankle inversion n n  Ankle eversion n n  Great Toe ext (L5)    Grossly     (Blank rows = not tested, score listed is out of 5 possible points.  N = WNL, D = diminished, C = clear for gross weakness with myotome testing, * = concordant pain with testing)  LE ROM:  ROM Right (Eval) Left (Eval)  Hip flexion    Hip extension    Hip abduction    Hip adduction    Hip internal rotation    Hip external  rotation    Knee extension    Knee flexion    Ankle dorsiflexion CC > 40 CC > 40  Ankle plantarflexion n n  Ankle inversion n n  Ankle eversion n n   (Blank rows = not tested, N = WNL, * = concordant pain with testing)  Functional Tests  Eval        SL heel raise: R limited by pain                                                     TODAY'S TREATMENT:  [facility] Adult PT Treatment  08/14/2023:  Therapeutic Exercise: Bike 5 min Isometric heel raise - 3x not exceeding 1' to fatigue Semi tandem on foam - 100% Tandem stance Updating HEP  Manual Therapy  STM and IASTM of R PF     HOME EXERCISE PROGRAM: Access Code: [email or web address] URL: [email or web address] Date: 07/25/2023  Prepared by: Lesleigh Rash  Exercises - Ankle and Toe Plantarflexion with Resistance  - 1 x daily - 7 x weekly - 3 reps - 1 minute hold - Seated Toe Towel Scrunches  - 1 x daily - 7 x weekly - 3x to fatigue  Treatment priorities   Eval        Progressive loading as tolerated                                          ASSESSMENT:  CLINICAL IMPRESSION:  08/15/2023:  Phyllis House tolerated session well with no adverse reaction.  Concentrated on progressing loading of PF with "burning" but no significant lasting pain.  Trialed IASTM with no clear immediate benefit.  Overall progressing with change in shoes and loading.  EVAL: Phyllis House is a 48 y.o. female who presents to clinic with signs and sxs consistent with proximal R>L plantar pain.  MRI shows mild inflammation but no significant structural abnormalities.  Her strength and ankle ROM is excellent with exception of R heel raise which is limited by pain.  Considering she has had little benefit from rest and injections I suggested we try to gradually load.  She will start out with isometrics and foot intrinsic endurance work.  Pt will benefit from skilled therapy to address relevant deficits and improve comfort in daily activities.    OBJECTIVE IMPAIRMENTS: Pain, plantar flexion strength  ACTIVITY LIMITATIONS: walking, standing, squatting, housework, work  PERSONAL FACTORS: See medical history and pertinent history   REHAB POTENTIAL: Good  CLINICAL DECISION MAKING: Stable/uncomplicated  EVALUATION COMPLEXITY: Moderate   GOALS:   SHORT TERM GOALS: Target date: 08/24/2023   Phyllis House will be >75% HEP compliant to improve carryover between sessions and facilitate independent management of condition  Evaluation: ongoing Goal status: INITIAL   LONG TERM GOALS: Target date: 09/21/2023   Phyllis House will self report >/= 50% decrease in pain from evaluation to improve function in daily tasks  Evaluation/Baseline: 7/10 max pain Goal status: INITIAL   2.  Phyllis House will be able to work, not limited by pain  Evaluation/Baseline: limited in standing at work Goal status: INITIAL   3.  Phyllis House will report confidence in self management of condition at time of discharge with advanced HEP  Evaluation/Baseline: unable to self manage Goal status: INITIAL  4.  Phyllis House will be able to complete 5x R SL heel raise, not limited by pain  Evaluation/Baseline: limited Goal status: INITIAL   PLAN: PT FREQUENCY: 1-2x/week  PT DURATION: 8 weeks  PLANNED INTERVENTIONS:  [id number]- PT Re-evaluation, 97110-Therapeutic exercises, 97530- Therapeutic activity, 97112- Neuromuscular re-education, 97535- Self Care, 16109- Manual therapy, [id number]- Gait training, [id number]- Aquatic Therapy, [id number]- Electrical stimulation (manual), [id number]- Vasopneumatic device, [id number]- Traction (mechanical), [id number]- Ionotophoresis 4mg /ml Dexamethasone, Taping, Dry Needling, Joint manipulation, and Spinal manipulation.   Brittanee Ghazarian PT, DPT 08/15/2023, 7:59 AM

## 2023-08-16 ENCOUNTER — Ambulatory Visit: Admitting: Podiatry

## 2023-08-30 ENCOUNTER — Ambulatory Visit

## 2023-09-06 ENCOUNTER — Ambulatory Visit: Attending: Podiatry

## 2023-09-06 DIAGNOSIS — M79672 Pain in left foot: Secondary | ICD-10-CM | POA: Diagnosis present

## 2023-09-06 DIAGNOSIS — M79671 Pain in right foot: Secondary | ICD-10-CM | POA: Insufficient documentation

## 2023-09-06 DIAGNOSIS — M6281 Muscle weakness (generalized): Secondary | ICD-10-CM | POA: Diagnosis present

## 2023-09-06 NOTE — Therapy (Signed)
 OUTPATIENT PHYSICAL THERAPY DAILY NOTE  Patient Name: Phyllis House MRN: [id number] DOB:July 01, 1975, 48 y.o., female Today's Date: 09/06/2023    PT End of Session - 09/06/23 1700     Visit Number 3    Date for PT Re-Evaluation 09/21/23    Authorization Type [employer] - LEFS    PT Start Time 1701    PT Stop Time 1742    PT Time Calculation (min) 41 min    Activity Tolerance Patient tolerated treatment well    Behavior During Therapy WFL for tasks assessed/performed               Past Medical History:  Diagnosis Date   Anxiety    Depression    Eczema    H/O varicella    Hematuria    History of herpes genitalis    History of kidney stones    pt has long hx calcium phosphate stones   Hypocalcemia    Migraine    Pneumonia    once 10+ yeaers ago   Polycystic ovarian syndrome    PONV (postoperative nausea and vomiting)    Renal tubular acidosis type I    Right ureteral stone    Seasonal allergies    Urgency of urination    Past Surgical History:  Procedure Laterality Date   CYSTOSCOPY W/ RETROGRADES Right 12/12/2019   Procedure: CYSTOSCOPY WITH RIGHT RETROGRADE PYELOGRAM AND STENT PLACEMENT;  Surgeon: Florencio Hunting, MD;  Location: [facility];  Service: Urology;  Laterality: Right;   CYSTOSCOPY W/ URETERAL STENT PLACEMENT Left 07/01/2012   Procedure: CYSTOSCOPY WITH RETROGRADE PYELOGRAM/URETERAL STENT PLACEMENT;  Surgeon: Soledad Dupes, MD;  Location: [facility];  Service: Urology;  Laterality: Left;   CYSTOSCOPY W/ URETERAL STENT PLACEMENT Right 07/27/2016   Procedure: CYSTOSCOPY WITH RETROGRADE PYELOGRAM/URETERAL STENT PLACEMENT;  Surgeon: Erman Hayward, MD;  Location: [facility];  Service: Urology;  Laterality: Right;   CYSTOSCOPY W/ URETERAL STENT PLACEMENT Right 05/08/2019   Procedure: CYSTOSCOPY WITH RETROGRADE PYELOGRAM/URETERAL STENT PLACEMENT;  Surgeon: Marco Severs, MD;  Location: [facility];  Service: Urology;  Laterality: Right;   CYSTOSCOPY W/ URETERAL  STENT PLACEMENT Right 03/05/2021   Procedure: CYSTOURETEROSCOPY WITH RETROGRADE PYELOGRAM/URETERAL STENT PLACEMENT RIGHT;  Surgeon: Marco Severs, MD;  Location: [facility];  Service: Urology;  Laterality: Right;   CYSTOSCOPY WITH RETROGRADE PYELOGRAM, URETEROSCOPY AND STENT PLACEMENT Left 07/15/2012   Procedure: CYSTOSCOPY WITH left RETROGRADE PYELOGRAM,left  URETEROSCOPY AND left STENT exchange ;  Surgeon: Soledad Dupes, MD;  Location: [facility];  Service: Urology;  Laterality: Left;  LEFT URETER STENT EXCHANGE     CYSTOSCOPY WITH RETROGRADE PYELOGRAM, URETEROSCOPY AND STENT PLACEMENT Right 08/11/2016   Procedure: CYSTOSCOPY WITH RIGHT RETROGRADE PYELOGRAM,RIGHT  URETEROSCOPY AND STENT CHANGE;  Surgeon: Mark Ottelin, MD;  Location: [facility];  Service: Urology;  Laterality: Right;   CYSTOSCOPY WITH URETEROSCOPY AND STENT PLACEMENT Right 07/12/2021   Procedure: CYSTOSCOPY WITH RIGHT URETEROSCOPY AND STENT PLACEMENT;  Surgeon: Christina Coyer, MD;  Location: [facility];  Service: Urology;  Laterality: Right;   CYSTOSCOPY/RETROGRADE/URETEROSCOPY/STONE EXTRACTION WITH BASKET  AGE 37   CYSTOSCOPY/URETEROSCOPY/HOLMIUM LASER/STENT PLACEMENT Bilateral 09/19/2018   Procedure: CYSTOSCOPY/RETROGRADE/URETEROSCOPY/HOLMIUM LASER/STENT PLACEMENT;  Surgeon: Christina Coyer, MD;  Location: [facility];  Service: Urology;  Laterality: Bilateral;   CYSTOSCOPY/URETEROSCOPY/HOLMIUM LASER/STENT PLACEMENT Right 10/22/2018   Procedure: CYSTOSCOPY/RETROGRADE/URETEROSCOPY/HOLMIUM LASER/STENT PLACEMENT;  Surgeon: Christina Coyer, MD;  Location: [facility];  Service: Urology;  Laterality: Right;  ONLY NEEDS 60 MIN   CYSTOSCOPY/URETEROSCOPY/HOLMIUM LASER/STENT PLACEMENT  Right 02/11/2019   Procedure: CYSTOSCOPY/RETROGRADE/URETEROSCOPY/HOLMIUM LASER/STENT PLACEMENT;  Surgeon: Christina Coyer, MD;  Location: [facility];  Service: Urology;  Laterality:  Right;   CYSTOSCOPY/URETEROSCOPY/HOLMIUM LASER/STENT PLACEMENT Bilateral 06/03/2019   Procedure: CYSTOSCOPY/RETROGRADE/URETEROSCOPY/HOLMIUM LASER/STENT PLACEMENT;  Surgeon: Christina Coyer, MD;  Location: [facility];  Service: Urology;  Laterality: Bilateral;   CYSTOSCOPY/URETEROSCOPY/HOLMIUM LASER/STENT PLACEMENT Right 12/30/2019   Procedure: CYSTOSCOPY, RETROGRADE /URETEROSCOPY/HOLMIUM LASER/STENT EXCHANGE;  Surgeon: Christina Coyer, MD;  Location: [facility];  Service: Urology;  Laterality: Right;   CYSTOSCOPY/URETEROSCOPY/HOLMIUM LASER/STENT PLACEMENT Right 10/17/2022   Procedure: CYSTOSCOPY/URETEROSCOPY/HOLMIUM LASER/STENT PLACEMENT/RIGHT NEPHROSTOMY TUBE REMOVAL;  Surgeon: Christina Coyer, MD;  Location: [facility];  Service: Urology;  Laterality: Right;   CYSTOSCOPY/URETEROSCOPY/HOLMIUM LASER/STENT PLACEMENT Left 12/01/2022   Procedure: CYSTOSCOPY/URETEROSCOPY/HOLMIUM LASER/STENT PLACEMENT;  Surgeon: Scarlet Curly, MD;  Location: [facility];  Service: Urology;  Laterality: Left;   EXTRACORPOREAL SHOCK WAVE LITHOTRIPSY Left 08/07/2019   Procedure: EXTRACORPOREAL SHOCK WAVE LITHOTRIPSY (ESWL);  Surgeon: Roxane Copp, MD;  Location: [facility];  Service: Urology;  Laterality: Left;   EXTRACORPOREAL SHOCK WAVE LITHOTRIPSY Left 04/08/2020   Procedure: EXTRACORPOREAL SHOCK WAVE LITHOTRIPSY (ESWL);  Surgeon: Sherlyn Ditto, MD;  Location: [facility];  Service: Urology;  Laterality: Left;   EXTRACORPOREAL SHOCK WAVE LITHOTRIPSY Right 12/16/2020   Procedure: EXTRACORPOREAL SHOCK WAVE LITHOTRIPSY (ESWL);  Surgeon: Erman Hayward, MD;  Location: [facility];  Service: Urology;  Laterality: Right;   HOLMIUM LASER APPLICATION Left 07/15/2012   Procedure: HOLMIUM LASER APPLICATION;  Surgeon: Soledad Dupes, MD;  Location: [facility];  Service: Urology;  Laterality: Left;   HOLMIUM LASER  APPLICATION Right 08/11/2016   Procedure: HOLMIUM LASER APPLICATION;  Surgeon: Mark Ottelin, MD;  Location: [facility];  Service: Urology;  Laterality: Right;   HOLMIUM LASER APPLICATION Right 02/11/2019   Procedure: HOLMIUM LASER APPLICATION;  Surgeon: Christina Coyer, MD;  Location: [facility];  Service: Urology;  Laterality: Right;   IR NEPHROSTOMY PLACEMENT RIGHT  10/16/2022   TONSILLECTOMY  AS CHILD   URETEROSCOPY     Patient Active Problem List   Diagnosis Date Noted   Hydronephrosis 10/15/2022   Ureteral stone with hydronephrosis 03/05/2021   Anxiety 10/06/2020   Obesity 10/06/2020   Ureteral calculus 12/12/2019   Renal tubular acidosis type I 06/24/2019   Hydronephrosis concurrent with and due to calculi of kidney and ureter 05/08/2019   Migraine without aura and without status migrainosus, not intractable 11/19/2013   Other malaise and fatigue 03/14/2013   Hypokalemia 12/26/2012   Infection of urinary tract 07/02/2012   Ureteral calculus, left 07/01/2012   Fever, unspecified 07/01/2012    PCP: Elester Grim, MD  REFERRING PROVIDER: Reina Cara, DPM  THERAPY DIAG:  Pain in right foot  Muscle weakness  Pain in left foot  REFERRING DIAG: 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 No, Number of falls: 0  MOI/History of condition:  Onset date: Re-occuring - about 8 months this bout  SUBJECTIVE STATEMENT  09/06/2023:  Patient that she is feeling some improvement with her new shoes. She does feel that she is experiencing mm weakness and fatigue. She mentions losing her balance when washing her hair in the shower with her eyes closed.  She states that she has a fear of tripping, falling and being in severe pain if she tries  to walking.   EVAL:  Janiylah Cooksley is a 47 y.o. female who presents to clinic with chief complaint of R plantar fascia pain with some pain now staring in L.  She has had bouts of PF for years.  This round started about 8 months ago.  She was starting to run and felt a pop in the bottom of her foot and her pain has been very severe since.  It is more sore after sitting or first thing in the morning but she feels that her current pain is different than the PF pain she has had in the past.  It encompass her entire heal.  It feels better after resting for a weekend.  She has been wearing orthotics which has helped.  She was in a boot and has tried several rounds of injections where were minimally helpful.   Red flags:  denies   Pain:  Are you having pain? 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 Mild distal Achilles tendinosis. 2. Mild plantar fasciitis of the medial band of the plantar fascia at the calcaneal insertion.  GENERAL OBSERVATION/GAIT:    SENSATION: Light touch: Appears intact  PALPATION: Significant TTP near calcaneal insertion    LE MMT:  MMT Right (Eval) Left (Eval)  Hip flexion (L2, L3) 4+ 4+  Knee extension (L3) 4+ 4+  Knee flexion 4+ 4+  Hip abduction 4+ 4+  Hip extension    Hip external rotation    Hip internal rotation    Hip adduction    Ankle dorsiflexion (L4) N but painful N  Ankle plantarflexion (S1) n n  Ankle inversion n n  Ankle eversion n n  Great Toe ext (L5)    Grossly     (Blank rows = not tested,  score listed is out of 5 possible points.  N = WNL, D = diminished, C = clear for gross weakness with myotome testing, * = concordant pain with testing)  LE ROM:  ROM Right (Eval) Left (Eval)  Hip flexion    Hip extension    Hip abduction    Hip adduction    Hip internal rotation    Hip external rotation    Knee extension    Knee flexion    Ankle dorsiflexion CC > 40 CC > 40  Ankle plantarflexion n n  Ankle inversion n n  Ankle eversion n n   (Blank rows = not tested, N = WNL, * = concordant pain with testing)  Functional Tests  Eval        SL heel raise: R limited by pain                                                     TODAY'S TREATMENT:   [facility] Adult PT Treatment  09/06/2023  Therapeutic Activity: Bike x 5 min Step Ups to 4" step with cueing for excessive hip flexion to improve foot clearance and mechanics  Isometric heel raise - 3x not exceeding 40-60 sec to fatigue Patient education regarding recommended approach to reported R hip weakness, knee buckling and dizziness   Neuromuscular Reeducation:  Tandem on foam 2 x 30 sec each with UE support as needed  SL cone taps (2) from foam, 2 rounds (1 min each)    [facility] Adult PT Treatment  08/14/2023:  Therapeutic Exercise: Bike 5 min Isometric heel raise - 3x not exceeding 1' to fatigue Semi tandem on foam - 100% Tandem stance Updating HEP  Manual Therapy  STM and IASTM of R PF     HOME EXERCISE PROGRAM: Access Code: [email or web address] URL: [email or web address] Date: 09/06/2023 Prepared by: Arlester Bence  Exercises - Tandem Stance  - 1 x daily - 7 x weekly - 1 sets - 4 reps - 45'' hold - Heel Raises with Counter Support  - 1 x daily - 7 x weekly - 1 sets - 3 reps - 1 min hold - Toe Yoga - Alternating Great Toe and Lesser Toe Extension  - 1 x daily - 7 x weekly - 10 reps - Side Step Over  - 1 x daily - Walking Step Over  - 1 x daily  Treatment priorities   Eval         Progressive loading as tolerated                                          ASSESSMENT:  CLINICAL IMPRESSION:  09/06/2023:  [facility] had good tolerance of today's treatment session, which focused on progression of weight bearing activities. Additional time spent with patient education regarding indicates for further PT assessment, pending physical referral d/t concurrent report of dizziness and balance impairments. We will continue to progress weight bearing activities to address plantar pain per POC.  Safiyya tolerated session well with no adverse reaction.  Concentrated on progressing loading of PF with "burning" but no significant lasting pain.  Trialed IASTM with no clear immediate benefit.  Overall progressing with change in shoes and loading.  EVAL: Anvika is a 48 y.o. female who presents to clinic with signs and sxs consistent with proximal R>L plantar pain.  MRI shows mild inflammation but no significant structural abnormalities.  Her strength and ankle ROM is excellent with exception of R heel raise which is limited by pain.  Considering she has had little benefit from rest and injections I suggested we try to gradually load.  She will start out with isometrics and foot intrinsic endurance work.  Pt will benefit from skilled therapy to address relevant deficits and improve comfort in daily activities.   OBJECTIVE IMPAIRMENTS: Pain, plantar flexion strength  ACTIVITY LIMITATIONS: walking, standing, squatting, housework, work  PERSONAL FACTORS: See medical history and pertinent history   REHAB POTENTIAL: Good  CLINICAL DECISION MAKING: Stable/uncomplicated  EVALUATION COMPLEXITY: Moderate   GOALS:   SHORT TERM GOALS: Target date: 08/24/2023   Naiovy will be >75% HEP compliant to improve carryover between sessions and facilitate independent management of condition  Evaluation: ongoing Goal status: INITIAL   LONG TERM GOALS: Target date: 09/21/2023   Enna will  self report >/= 50% decrease in pain from evaluation to improve function in daily tasks  Evaluation/Baseline: 7/10 max pain Goal status: INITIAL  2.  Reauna will be able to work, not limited by pain  Evaluation/Baseline: limited in standing at work Goal status: INITIAL   3.  Dalena will report confidence in self management of condition at time of discharge with advanced HEP  Evaluation/Baseline: unable to self manage Goal status: INITIAL  4.  Raymie will be able to complete 5x R SL heel raise, not limited by pain  Evaluation/Baseline: limited Goal status: INITIAL   PLAN: PT FREQUENCY: 1-2x/week  PT DURATION: 8 weeks  PLANNED INTERVENTIONS:  [id number]- PT Re-evaluation, 97110-Therapeutic exercises, 97530- Therapeutic activity, 97112- Neuromuscular re-education, 97535- Self Care, [id number]- Manual therapy, [id number]- Gait training, [id number]- Aquatic Therapy, [id number]- Electrical stimulation (manual), [id number]- Vasopneumatic device, [id number]- Traction (mechanical), [id number]- Ionotophoresis 4mg /ml Dexamethasone , Taping, Dry Needling, Joint manipulation, and Spinal manipulation.   Arlester Bence, PT, DPT  09/07/2023 2:16 PM

## 2023-09-13 ENCOUNTER — Ambulatory Visit

## 2023-09-13 DIAGNOSIS — M79671 Pain in right foot: Secondary | ICD-10-CM

## 2023-09-13 NOTE — Therapy (Signed)
 OUTPATIENT PHYSICAL THERAPY DAILY NOTE  Patient Name: Phyllis House MRN: [id number] DOB:April 20, 1976, 48 y.o., female Today's Date: 09/13/2023   PT End of Session - 09/13/23 1714     Visit Number 4    Date for PT Re-Evaluation 09/21/23    Authorization Type [employer] - LEFS    PT Start Time 1705    PT Stop Time 1743    PT Time Calculation (min) 38 min    Activity Tolerance Patient tolerated treatment well    Behavior During Therapy WFL for tasks assessed/performed                  Past Medical History:  Diagnosis Date   Anxiety    Depression    Eczema    H/O varicella    Hematuria    History of herpes genitalis    History of kidney stones    pt has long hx calcium phosphate stones   Hypocalcemia    Migraine    Pneumonia    once 10+ yeaers ago   Polycystic ovarian syndrome    PONV (postoperative nausea and vomiting)    Renal tubular acidosis type I    Right ureteral stone    Seasonal allergies    Urgency of urination    Past Surgical History:  Procedure Laterality Date   CYSTOSCOPY W/ RETROGRADES Right 12/12/2019   Procedure: CYSTOSCOPY WITH RIGHT RETROGRADE PYELOGRAM AND STENT PLACEMENT;  Surgeon: Florencio Hunting, MD;  [facility];  Service: Urology;  Laterality: Right;   CYSTOSCOPY W/ URETERAL STENT PLACEMENT Left 07/01/2012   Procedure: CYSTOSCOPY WITH RETROGRADE PYELOGRAM/URETERAL STENT PLACEMENT;  Surgeon: Soledad Dupes, MD;  [facility];  Service: Urology;  Laterality: Left;   CYSTOSCOPY W/ URETERAL STENT PLACEMENT Right 07/27/2016   Procedure: CYSTOSCOPY WITH RETROGRADE PYELOGRAM/URETERAL STENT PLACEMENT;  Surgeon: Erman Hayward, MD;  [facility];  Service: Urology;  Laterality: Right;   CYSTOSCOPY W/ URETERAL STENT PLACEMENT Right 05/08/2019   Procedure: CYSTOSCOPY WITH RETROGRADE PYELOGRAM/URETERAL STENT PLACEMENT;  Surgeon: Marco Severs, MD;  [facility];  Service: Urology;  Laterality: Right;   CYSTOSCOPY W/  URETERAL STENT PLACEMENT Right 03/05/2021   Procedure: CYSTOURETEROSCOPY WITH RETROGRADE PYELOGRAM/URETERAL STENT PLACEMENT RIGHT;  Surgeon: Marco Severs, MD;  [facility];  Service: Urology;  Laterality: Right;   CYSTOSCOPY WITH RETROGRADE PYELOGRAM, URETEROSCOPY AND STENT PLACEMENT Left 07/15/2012   Procedure: CYSTOSCOPY WITH left RETROGRADE PYELOGRAM,left  URETEROSCOPY AND left STENT exchange ;  Surgeon: Soledad Dupes, MD;  Location: [facility];  Service: Urology;  Laterality: Left;  LEFT URETER STENT EXCHANGE     CYSTOSCOPY WITH RETROGRADE PYELOGRAM, URETEROSCOPY AND STENT PLACEMENT Right 08/11/2016   Procedure: CYSTOSCOPY WITH RIGHT RETROGRADE PYELOGRAM,RIGHT  URETEROSCOPY AND STENT CHANGE;  Surgeon: Mark Ottelin, MD;  Location: [facility];  Service: Urology;  Laterality: Right;   CYSTOSCOPY WITH URETEROSCOPY AND STENT PLACEMENT Right 07/12/2021   Procedure: CYSTOSCOPY WITH RIGHT URETEROSCOPY AND STENT PLACEMENT;  Surgeon: Christina Coyer, MD;  [facility];  Service: Urology;  Laterality: Right;   CYSTOSCOPY/RETROGRADE/URETEROSCOPY/STONE EXTRACTION WITH BASKET  AGE 28   CYSTOSCOPY/URETEROSCOPY/HOLMIUM LASER/STENT PLACEMENT Bilateral 09/19/2018   Procedure: CYSTOSCOPY/RETROGRADE/URETEROSCOPY/HOLMIUM LASER/STENT PLACEMENT;  Surgeon: Christina Coyer, MD;  [facility];  Service: Urology;  Laterality: Bilateral;   CYSTOSCOPY/URETEROSCOPY/HOLMIUM LASER/STENT PLACEMENT Right 10/22/2018   Procedure: CYSTOSCOPY/RETROGRADE/URETEROSCOPY/HOLMIUM LASER/STENT PLACEMENT;  Surgeon: Christina Coyer, MD;  Location: [facility];  Service: Urology;  Laterality: Right;  ONLY NEEDS 60 MIN   CYSTOSCOPY/URETEROSCOPY/HOLMIUM  LASER/STENT PLACEMENT Right 02/11/2019   Procedure: CYSTOSCOPY/RETROGRADE/URETEROSCOPY/HOLMIUM LASER/STENT PLACEMENT;  Surgeon: Christina Coyer, MD;  Location: [facility];  Service: Urology;   Laterality: Right;   CYSTOSCOPY/URETEROSCOPY/HOLMIUM LASER/STENT PLACEMENT Bilateral 06/03/2019   Procedure: CYSTOSCOPY/RETROGRADE/URETEROSCOPY/HOLMIUM LASER/STENT PLACEMENT;  Surgeon: Christina Coyer, MD;  Location: [facility];  Service: Urology;  Laterality: Bilateral;   CYSTOSCOPY/URETEROSCOPY/HOLMIUM LASER/STENT PLACEMENT Right 12/30/2019   Procedure: CYSTOSCOPY, RETROGRADE /URETEROSCOPY/HOLMIUM LASER/STENT EXCHANGE;  Surgeon: Christina Coyer, MD;  Location: [facility];  Service: Urology;  Laterality: Right;   CYSTOSCOPY/URETEROSCOPY/HOLMIUM LASER/STENT PLACEMENT Right 10/17/2022   Procedure: CYSTOSCOPY/URETEROSCOPY/HOLMIUM LASER/STENT PLACEMENT/RIGHT NEPHROSTOMY TUBE REMOVAL;  Surgeon: Christina Coyer, MD;  [facility];  Service: Urology;  Laterality: Right;   CYSTOSCOPY/URETEROSCOPY/HOLMIUM LASER/STENT PLACEMENT Left 12/01/2022   Procedure: CYSTOSCOPY/URETEROSCOPY/HOLMIUM LASER/STENT PLACEMENT;  Surgeon: Scarlet Curly, MD;  [facility];  Service: Urology;  Laterality: Left;   EXTRACORPOREAL SHOCK WAVE LITHOTRIPSY Left 08/07/2019   Procedure: EXTRACORPOREAL SHOCK WAVE LITHOTRIPSY (ESWL);  Surgeon: Roxane Copp, MD;  Location: [facility];  Service: Urology;  Laterality: Left;   EXTRACORPOREAL SHOCK WAVE LITHOTRIPSY Left 04/08/2020   Procedure: EXTRACORPOREAL SHOCK WAVE LITHOTRIPSY (ESWL);  Surgeon: Sherlyn Ditto, MD;  Location: [facility];  Service: Urology;  Laterality: Left;   EXTRACORPOREAL SHOCK WAVE LITHOTRIPSY Right 12/16/2020   Procedure: EXTRACORPOREAL SHOCK WAVE LITHOTRIPSY (ESWL);  Surgeon: Erman Hayward, MD;  Location: [facility];  Service: Urology;  Laterality: Right;   HOLMIUM LASER APPLICATION Left 07/15/2012   Procedure: HOLMIUM LASER APPLICATION;  Surgeon: Soledad Dupes, MD;  Location: [facility];  Service: Urology;  Laterality: Left;   HOLMIUM  LASER APPLICATION Right 08/11/2016   Procedure: HOLMIUM LASER APPLICATION;  Surgeon: Mark Ottelin, MD;  Location: [facility];  Service: Urology;  Laterality: Right;   HOLMIUM LASER APPLICATION Right 02/11/2019   Procedure: HOLMIUM LASER APPLICATION;  Surgeon: Christina Coyer, MD;  Location: [facility];  Service: Urology;  Laterality: Right;   IR NEPHROSTOMY PLACEMENT RIGHT  10/16/2022   TONSILLECTOMY  AS CHILD   URETEROSCOPY     Patient Active Problem List   Diagnosis Date Noted   Hydronephrosis 10/15/2022   Ureteral stone with hydronephrosis 03/05/2021   Anxiety 10/06/2020   Obesity 10/06/2020   Ureteral calculus 12/12/2019   Renal tubular acidosis type I 06/24/2019   Hydronephrosis concurrent with and due to calculi of kidney and ureter 05/08/2019   Migraine without aura and without status migrainosus, not intractable 11/19/2013   Other malaise and fatigue 03/14/2013   Hypokalemia 12/26/2012   Infection of urinary tract 07/02/2012   Ureteral calculus, left 07/01/2012   Fever, unspecified 07/01/2012    PCP: Elester Grim, MD  REFERRING PROVIDER: Reina Cara, DPM  THERAPY DIAG:  Pain in right foot  REFERRING DIAG: 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 No, Number of falls: 0  MOI/History of condition:  Onset date: Re-occuring - about 8 months this bout  SUBJECTIVE STATEMENT  09/13/2023:  Patient reports that she has had more pain over the past few days. She has been wearing her inserts regularly and using the shoes that "Dr. Marvis Sluder said were okay." She did grab the wrong inserts this morning and has increased pain today. She  has been applying Voltaren  gel the last two evenings.   EVAL:  Phyllis House is a 48 y.o. female  who presents to clinic with chief complaint of R plantar fascia pain with some pain now staring in L.  She has had bouts of PF for years.  This round started about 8 months ago.  She was starting to run and felt a pop in the bottom of her foot and her pain has been very severe since.  It is more sore after sitting or first thing in the morning but she feels that her current pain is different than the PF pain she has had in the past.  It encompass her entire heal.  It feels better after resting for a weekend.  She has been wearing orthotics which has helped.  She was in a boot and has tried several rounds of injections where were minimally helpful.   Red flags:  denies   Pain:  Are you having pain? 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 Mild distal Achilles tendinosis. 2. Mild plantar fasciitis of the medial band of the plantar fascia at the calcaneal insertion.  GENERAL OBSERVATION/GAIT:    SENSATION: Light touch: Appears intact  PALPATION: Significant TTP near calcaneal insertion    LE MMT:  MMT Right (Eval) Left (Eval)  Hip flexion (L2, L3) 4+ 4+  Knee extension (L3) 4+ 4+  Knee flexion 4+ 4+  Hip abduction 4+ 4+  Hip extension    Hip external rotation    Hip internal rotation    Hip adduction    Ankle dorsiflexion (L4) N but painful N  Ankle plantarflexion (S1) n n  Ankle inversion n n  Ankle eversion n n  Great Toe ext (L5)    Grossly     (Blank rows = not tested, score listed is out of 5 possible points.  N = WNL, D =  diminished, C = clear for gross weakness with myotome testing, * = concordant pain with testing)  LE ROM:  ROM Right (Eval) Left (Eval)  Hip flexion    Hip extension    Hip abduction    Hip adduction    Hip internal rotation    Hip external rotation    Knee extension    Knee flexion    Ankle dorsiflexion CC > 40 CC > 40  Ankle plantarflexion n n  Ankle inversion n n  Ankle eversion n n   (Blank rows = not tested, N = WNL, * = concordant pain with testing)  Functional Tests  Eval        SL heel raise: R limited by pain                                                     TODAY'S TREATMENT:  [facility] Adult PT Treatment  09/13/2023   Therapeutic Exercise: Bike level 3 x 5 min Seated Plantar  Fascia stretch with towel   Manual Therapy:  STM and myofascial release along medial and proximal/calcaneal portion of R plantar fascia  Passive plantar fascia stretching   Self-care:  Patient education regarding purpose of diclofenac  gel and encouraged to use daily/as prescribed by DPM to address local inflammation and swelling Patient education regarding indications for and use of heel lift; with trial (patient provided with heel lift today)        HOME EXERCISE PROGRAM: Access Code: [email or web address] URL: [email or web address] Date: 09/06/2023 Prepared by: Arlester Bence  Exercises - Tandem Stance  - 1 x daily - 7 x weekly - 1 sets - 4 reps - 45'' hold - Heel Raises with Counter Support  - 1 x daily - 7 x weekly - 1 sets - 3 reps - 1 min hold - Toe Yoga - Alternating Great Toe and Lesser Toe Extension  - 1 x daily - 7 x weekly - 10 reps - Side Step Over  - 1 x daily - Walking Step Over  - 1 x daily  Treatment priorities   Eval        Progressive loading as tolerated                                          ASSESSMENT:  CLINICAL IMPRESSION:  09/13/2023: Phyllis House had good tolerance of today's treatment session, including reported symptom  improvement following manual therapy. Encouraged patient to work on self-plantar fascia stretches and include use of heel lift to relieve pressure. She will follow up with referring provider. We will continue as indicated per POC in order to address remaining deficits and return to PLOF.    EVAL: Phyllis House is a 48 y.o. female who presents to clinic with signs and sxs consistent with proximal R>L plantar pain.  MRI shows mild inflammation but no significant structural abnormalities.  Her strength and ankle ROM is excellent with exception of R heel raise which is limited by pain.  Considering she has had little benefit from rest and injections I suggested we try to gradually load.  She will start out with isometrics and foot intrinsic endurance work.  Pt will benefit from skilled therapy to address relevant deficits and improve comfort in daily activities.   OBJECTIVE IMPAIRMENTS: Pain, plantar flexion strength  ACTIVITY LIMITATIONS: walking, standing, squatting, housework, work  PERSONAL FACTORS: See medical history and pertinent history   REHAB POTENTIAL: Good  CLINICAL DECISION MAKING: Stable/uncomplicated  EVALUATION COMPLEXITY: Moderate   GOALS:   SHORT TERM GOALS: Target date: 08/24/2023   [place] will be >75% HEP compliant to improve carryover between sessions and facilitate independent management of condition  Evaluation: ongoing Goal status: INITIAL   LONG TERM GOALS: Target date: 09/21/2023   Pabla will self report >/= 50% decrease in pain from evaluation to improve function in daily tasks  Evaluation/Baseline: 7/10 max pain Goal status: INITIAL   2.  Emmett will be able to work, not limited by pain  Evaluation/Baseline: limited in standing at work Goal status: INITIAL   3.  Detra will report confidence in self management of condition at time of discharge with advanced HEP  Evaluation/Baseline: unable to self manage Goal status: INITIAL  4.  Mck[place]leigh  will be able to complete 5x R SL heel raise, not limited by pain  Evaluation/Baseline: limited Goal status: INITIAL   PLAN: PT FREQUENCY: 1-2x/week  PT DURATION: 8 weeks  PLANNED INTERVENTIONS:  97164- PT Re-evaluation, 97110-Therapeutic exercises, 97530- Therapeutic activity, [id number]- Neuromuscular re-education, 97535- Self Care, [id number]- Manual therapy, [id number]- Gait training, [id number]- Aquatic Therapy, [phone]- Electrical stimulation (manual), [id number]- Vasopneumatic device, [id number]- Traction (mechanical), [id number]- Ionotophoresis 4mg /ml Dexamethasone , Taping, Dry Needling, Joint manipulation, and Spinal manipulation.   Arlester Bence, PT, DPT  09/13/2023 6:22 PM

## 2023-09-14 ENCOUNTER — Encounter: Payer: Self-pay | Admitting: Podiatry

## 2023-09-14 ENCOUNTER — Ambulatory Visit: Admitting: Podiatry

## 2023-09-14 DIAGNOSIS — M216X1 Other acquired deformities of right foot: Secondary | ICD-10-CM

## 2023-09-14 DIAGNOSIS — M722 Plantar fascial fibromatosis: Secondary | ICD-10-CM | POA: Diagnosis not present

## 2023-09-14 NOTE — Progress Notes (Signed)
 Chief Complaint  Patient presents with   Plantar Fasciitis    RM#1 Patient presents today with right foot heel pain states has been doing physical therapy and is having a difficult time finds herself walking on toes due to the heel pain.    HPI: 48 y.o. female presenting today for follow-up evaluation right foot plantar fasciitis.  She is well into her course of physical therapy.  She reports not having a lot of relief and still dealing with a fair amount of pain.  Past Medical History:  Diagnosis Date   Anxiety    Depression    Eczema    H/O varicella    Hematuria    History of herpes genitalis    History of kidney stones    pt has long hx calcium phosphate stones   Hypocalcemia    Migraine    Pneumonia    once 10+ yeaers ago   Polycystic ovarian syndrome    PONV (postoperative nausea and vomiting)    Renal tubular acidosis type I    Right ureteral stone    Seasonal allergies    Urgency of urination     Past Surgical History:  Procedure Laterality Date   CYSTOSCOPY W/ RETROGRADES Right 12/12/2019   Procedure: CYSTOSCOPY WITH RIGHT RETROGRADE PYELOGRAM AND STENT PLACEMENT;  Surgeon: Florencio Hunting, MD;  [facility];  Service: Urology;  Laterality: Right;   CYSTOSCOPY W/ URETERAL STENT PLACEMENT Left 07/01/2012   Procedure: CYSTOSCOPY WITH RETROGRADE PYELOGRAM/URETERAL STENT PLACEMENT;  Surgeon: Soledad Dupes, MD;  [facility];  Service: Urology;  Laterality: Left;   CYSTOSCOPY W/ URETERAL STENT PLACEMENT Right 07/27/2016   Procedure: CYSTOSCOPY WITH RETROGRADE PYELOGRAM/URETERAL STENT PLACEMENT;  Surgeon: Erman Hayward, MD;  [facility];  Service: Urology;  Laterality: Right;   CYSTOSCOPY W/ URETERAL STENT PLACEMENT Right 05/08/2019   Procedure: CYSTOSCOPY WITH RETROGRADE PYELOGRAM/URETERAL STENT PLACEMENT;  Surgeon: Marco Severs, MD;  [facility];  Service: Urology;  Laterality: Right;   CYSTOSCOPY W/ URETERAL STENT PLACEMENT  Right 03/05/2021   Procedure: CYSTOURETEROSCOPY WITH RETROGRADE PYELOGRAM/URETERAL STENT PLACEMENT RIGHT;  Surgeon: Marco Severs, MD;  [facility];  Service: Urology;  Laterality: Right;   CYSTOSCOPY WITH RETROGRADE PYELOGRAM, URETEROSCOPY AND STENT PLACEMENT Left 07/15/2012   Procedure: CYSTOSCOPY WITH left RETROGRADE PYELOGRAM,left  URETEROSCOPY AND left STENT exchange ;  Surgeon: Soledad Dupes, MD;  Location: [facility];  Service: Urology;  Laterality: Left;  LEFT URETER STENT EXCHANGE     CYSTOSCOPY WITH RETROGRADE PYELOGRAM, URETEROSCOPY AND STENT PLACEMENT Right 08/11/2016   Procedure: CYSTOSCOPY WITH RIGHT RETROGRADE PYELOGRAM,RIGHT  URETEROSCOPY AND STENT CHANGE;  Surgeon: Mark Ottelin, MD;  Location: [facility];  Service: Urology;  Laterality: Right;   CYSTOSCOPY WITH URETEROSCOPY AND STENT PLACEMENT Right 07/12/2021   Procedure: CYSTOSCOPY WITH RIGHT URETEROSCOPY AND STENT PLACEMENT;  Surgeon: Christina Coyer, MD;  [facility];  Service: Urology;  Laterality: Right;   CYSTOSCOPY/RETROGRADE/URETEROSCOPY/STONE EXTRACTION WITH BASKET  AGE 70   CYSTOSCOPY/URETEROSCOPY/HOLMIUM LASER/STENT PLACEMENT Bilateral 09/19/2018   Procedure: CYSTOSCOPY/RETROGRADE/URETEROSCOPY/HOLMIUM LASER/STENT PLACEMENT;  Surgeon: Christina Coyer, MD;  [facility];  Service: Urology;  Laterality: Bilateral;   CYSTOSCOPY/URETEROSCOPY/HOLMIUM LASER/STENT PLACEMENT Right 10/22/2018   Procedure: CYSTOSCOPY/RETROGRADE/URETEROSCOPY/HOLMIUM LASER/STENT PLACEMENT;  Surgeon: Christina Coyer, MD;  [facility];  Service: Urology;  Laterality: Right;  ONLY NEEDS 60 MIN   CYSTOSCOPY/URETEROSCOPY/HOLMIUM LASER/STENT PLACEMENT Right 02/11/2019   Procedure: CYSTOSCOPY/RETROGRADE/URETEROSCOPY/HOLMIUM LASER/STENT PLACEMENT;  Surgeon: Christina Coyer, MD;  Location: [facility];  Service: Urology;  Laterality: Right;    CYSTOSCOPY/URETEROSCOPY/HOLMIUM LASER/STENT PLACEMENT Bilateral 06/03/2019   Procedure: CYSTOSCOPY/RETROGRADE/URETEROSCOPY/HOLMIUM LASER/STENT PLACEMENT;  Surgeon: Christina Coyer, MD;  Location: [facility];  Service: Urology;  Laterality: Bilateral;   CYSTOSCOPY/URETEROSCOPY/HOLMIUM LASER/STENT PLACEMENT Right 12/30/2019   Procedure: CYSTOSCOPY, RETROGRADE /URETEROSCOPY/HOLMIUM LASER/STENT EXCHANGE;  Surgeon: Christina Coyer, MD;  Location: [facility];  Service: Urology;  Laterality: Right;   CYSTOSCOPY/URETEROSCOPY/HOLMIUM LASER/STENT PLACEMENT Right 10/17/2022   Procedure: CYSTOSCOPY/URETEROSCOPY/HOLMIUM LASER/STENT PLACEMENT/RIGHT NEPHROSTOMY TUBE REMOVAL;  Surgeon: Christina Coyer, MD;  [facility];  Service: Urology;  Laterality: Right;   CYSTOSCOPY/URETEROSCOPY/HOLMIUM LASER/STENT PLACEMENT Left 12/01/2022   Procedure: CYSTOSCOPY/URETEROSCOPY/HOLMIUM LASER/STENT PLACEMENT;  Surgeon: Scarlet Curly, MD;  [facility];  Service: Urology;  Laterality: Left;   EXTRACORPOREAL SHOCK WAVE LITHOTRIPSY Left 08/07/2019   Procedure: EXTRACORPOREAL SHOCK WAVE LITHOTRIPSY (ESWL);  Surgeon: Roxane Copp, MD;  Location: [facility];  Service: Urology;  Laterality: Left;   EXTRACORPOREAL SHOCK WAVE LITHOTRIPSY Left 04/08/2020   Procedure: EXTRACORPOREAL SHOCK WAVE LITHOTRIPSY (ESWL);  Surgeon: Sherlyn Ditto, MD;  Location: [facility];  Service: Urology;  Laterality: Left;   EXTRACORPOREAL SHOCK WAVE LITHOTRIPSY Right 12/16/2020   Procedure: EXTRACORPOREAL SHOCK WAVE LITHOTRIPSY (ESWL);  Surgeon: Erman Hayward, MD;  Location: [facility];  Service: Urology;  Laterality: Right;   HOLMIUM LASER APPLICATION Left 07/15/2012   Procedure: HOLMIUM LASER APPLICATION;  Surgeon: Soledad Dupes, MD;  Location: [facility];  Service: Urology;  Laterality: Left;   HOLMIUM LASER APPLICATION Right  08/11/2016   Procedure: HOLMIUM LASER APPLICATION;  Surgeon: Mark Ottelin, MD;  Location: [facility];  Service: Urology;  Laterality: Right;   HOLMIUM LASER APPLICATION Right 02/11/2019   Procedure: HOLMIUM LASER APPLICATION;  Surgeon: Christina Coyer, MD;  Location: [facility];  Service: Urology;  Laterality: Right;   IR NEPHROSTOMY PLACEMENT RIGHT  10/16/2022   TONSILLECTOMY  AS CHILD   URETEROSCOPY      Allergies  Allergen Reactions   Adhesive [Tape] Rash    Pt states paper tape causes rash, but "plastic tape" is not problematic.      Physical Exam: General: The patient is alert and oriented x3 in no acute distress.  Dermatology:  No ecchymosis, erythema, or edema bilateral.  No open lesions.    Vascular: Palpable pedal pulses bilaterally. Capillary refill within normal limits.  No appreciable edema.    Neurological: Light touch sensation intact bilateral.  MMT 5/5 to lower extremity bilateral. Negative Tinel's sign with percussion of the posterior tibial nerve on the affected extremity.    Musculoskeletal Exam:  There is pain on palpation of the plantarmedial & plantarcentral aspect of right heel.  No gaps or nodules within the plantar fascia today.  Antalgic gait noted with first few steps upon standing.  No pain on palpation of achilles tendon bilateral.  Ankle df less than 10 degrees with knee extended b/l. Early heel rise on gait, patient does report hamstring tightness.  Radiographic Exam: Right foot 04/27/23 Normal osseous mineralization. Joint spaces preserved.  No fractures or osseous irregularities noted.  MRI right foot: 07/01/2023 IMPRESSION: 1. Mild distal Achilles tendinosis. 2. Mild plantar fasciitis of the medial band of the plantar fascia at the calcaneal insertion.  Assessment/Plan of Care: 1. Plantar fascial fibromatosis   2. Acquired equinus deformity of right foot      Meds ordered this encounter  Medications   meloxicam   (  MOBIC ) 15 MG tablet    Sig: Take 1 tablet (15 mg total) by mouth daily for 21 days.    Dispense:  21 tablet    Refill:  0   None  She reports still dealing with a fair amount of pain.  At this point she would like to discuss other options.  She would like to consider PRP injections.  We did discuss this at length.  She thinks that she would be able to do them beginning of June.  I did discuss that he may take 2-3 of the injections spaced out to see relief.  Did discuss that I would want her protected weightbearing in cam boot after injections.  In the meantime, continue with course of physical therapy.  Continue home stretching.  Okay to continue oral NSAID use and topical diclofenac  gel.    Return in about 3 weeks (around 10/05/2023) for Plantar Fasciitis/PRP.   Eve Hinders, DPM, AACFAS [facility]     [address] [place], [place] [address]                Office [phone]  Fax [phone]

## 2023-09-17 MED ORDER — MELOXICAM 15 MG PO TABS: 15.0000 mg | ORAL_TABLET | Freq: Every day | ORAL | 0 refills | Status: AC

## 2023-09-25 ENCOUNTER — Encounter: Payer: Self-pay | Admitting: Physical Therapy

## 2023-09-25 ENCOUNTER — Ambulatory Visit: Admitting: Physical Therapy

## 2023-09-25 DIAGNOSIS — M6281 Muscle weakness (generalized): Secondary | ICD-10-CM

## 2023-09-25 DIAGNOSIS — M79671 Pain in right foot: Secondary | ICD-10-CM | POA: Diagnosis not present

## 2023-09-25 DIAGNOSIS — M79672 Pain in left foot: Secondary | ICD-10-CM

## 2023-09-25 NOTE — Therapy (Unsigned)
 PHYSICAL THERAPY DISCHARGE SUMMARY  Visits from Start of Care: 5  Current functional level related to goals / functional outcomes: See assessment/goals   Remaining deficits: See assessment/goals   Education / Equipment: HEP and D/C plans  Patient agrees to discharge. Patient goals were met. Patient is being discharged due to meeting the stated rehab goals.  Patient Name: Phyllis House MRN: [id number] DOB:November 22, 1975, 48 y.o., female Today's Date: 09/26/2023   PT End of Session - 09/25/23 1550     Visit Number 5    Date for PT Re-Evaluation 09/21/23    Authorization Type [employer] - LEFS    PT Start Time 1550    PT Stop Time 1630    PT Time Calculation (min) 40 min    Activity Tolerance Patient tolerated treatment well    Behavior During Therapy WFL for tasks assessed/performed                  Past Medical History:  Diagnosis Date   Anxiety    Depression    Eczema    H/O varicella    Hematuria    History of herpes genitalis    History of kidney stones    pt has long hx calcium phosphate stones   Hypocalcemia    Migraine    Pneumonia    once 10+ yeaers ago   Polycystic ovarian syndrome    PONV (postoperative nausea and vomiting)    Renal tubular acidosis type I    Right ureteral stone    Seasonal allergies    Urgency of urination    Past Surgical History:  Procedure Laterality Date   CYSTOSCOPY W/ RETROGRADES Right 12/12/2019   Procedure: CYSTOSCOPY WITH RIGHT RETROGRADE PYELOGRAM AND STENT PLACEMENT;  Surgeon: Florencio Hunting, MD;  Location: [facility];  Service: Urology;  Laterality: Right;   CYSTOSCOPY W/ URETERAL STENT PLACEMENT Left 07/01/2012   Procedure: CYSTOSCOPY WITH RETROGRADE PYELOGRAM/URETERAL STENT PLACEMENT;  Surgeon: Soledad Dupes, MD;  Location: [facility];  Service: Urology;  Laterality: Left;   CYSTOSCOPY W/ URETERAL STENT PLACEMENT Right 07/27/2016   Procedure: CYSTOSCOPY WITH RETROGRADE PYELOGRAM/URETERAL STENT PLACEMENT;   Surgeon: Erman Hayward, MD;  Location: [facility];  Service: Urology;  Laterality: Right;   CYSTOSCOPY W/ URETERAL STENT PLACEMENT Right 05/08/2019   Procedure: CYSTOSCOPY WITH RETROGRADE PYELOGRAM/URETERAL STENT PLACEMENT;  Surgeon: Marco Severs, MD;  Location: [facility];  Service: Urology;  Laterality: Right;   CYSTOSCOPY W/ URETERAL STENT PLACEMENT Right 03/05/2021   Procedure: CYSTOURETEROSCOPY WITH RETROGRADE PYELOGRAM/URETERAL STENT PLACEMENT RIGHT;  Surgeon: Marco Severs, MD;  Location: [facility];  Service: Urology;  Laterality: Right;   CYSTOSCOPY WITH RETROGRADE PYELOGRAM, URETEROSCOPY AND STENT PLACEMENT Left 07/15/2012   Procedure: CYSTOSCOPY WITH left RETROGRADE PYELOGRAM,left  URETEROSCOPY AND left STENT exchange ;  Surgeon: Soledad Dupes, MD;  Location[facility];  Service: Urology;  Laterality: Left;  LEFT URETER STENT EXCHANGE     CYSTOSCOPY WITH RETROGRADE PYELOGRAM, URETEROSCOPY AND STENT PLACEMENT Right 08/11/2016   Procedure: CYSTOSCOPY WITH RIGHT RETROGRADE PYELOGRAM,RIGHT  URETEROSCOPY AND STENT CHANGE;  Surgeon: Mark Ottelin, MD;  Location: [facility];  Service: Urology;  Laterality: Right;   CYSTOSCOPY WITH URETEROSCOPY AND STENT PLACEMENT Right 07/12/2021   Procedure: CYSTOSCOPY WITH RIGHT URETEROSCOPY AND STENT PLACEMENT;  Surgeon: Christina Coyer, MD;  Location: [facility];  Service: Urology;  Laterality: Right;   CYSTOSCOPY/RETROGRADE/URETEROSCOPY/STONE EXTRACTION WITH BASKET  AGE 5   CYSTOSCOPY/URETEROSCOPY/HOLMIUM LASER/STENT PLACEMENT Bilateral 09/19/2018   Procedure: CYSTOSCOPY/RETROGRADE/URETEROSCOPY/HOLMIUM LASER/STENT PLACEMENT;  Surgeon: Christina Coyer, MD;  Location: [facility];  Service: Urology;  Laterality: Bilateral;   CYSTOSCOPY/URETEROSCOPY/HOLMIUM LASER/STENT PLACEMENT Right 10/22/2018   Procedure: CYSTOSCOPY/RETROGRADE/URETEROSCOPY/HOLMIUM LASER/STENT PLACEMENT;  Surgeon: Christina Coyer, MD;  Location:  [facility];  Service: Urology;  Laterality: Right;  ONLY NEEDS 60 MIN   CYSTOSCOPY/URETEROSCOPY/HOLMIUM LASER/STENT PLACEMENT Right 02/11/2019   Procedure: CYSTOSCOPY/RETROGRADE/URETEROSCOPY/HOLMIUM LASER/STENT PLACEMENT;  Surgeon: Christina Coyer, MD;  Location: [facility];  Service: Urology;  Laterality: Right;   CYSTOSCOPY/URETEROSCOPY/HOLMIUM LASER/STENT PLACEMENT Bilateral 06/03/2019   Procedure: CYSTOSCOPY/RETROGRADE/URETEROSCOPY/HOLMIUM LASER/STENT PLACEMENT;  Surgeon: Christina Coyer, MD;  Location: [facility];  Service: Urology;  Laterality: Bilateral;   CYSTOSCOPY/URETEROSCOPY/HOLMIUM LASER/STENT PLACEMENT Right 12/30/2019   Procedure: CYSTOSCOPY, RETROGRADE /URETEROSCOPY/HOLMIUM LASER/STENT EXCHANGE;  Surgeon: Christina Coyer, MD;  Location: [facility];  Service: Urology;  Laterality: Right;   CYSTOSCOPY/URETEROSCOPY/HOLMIUM LASER/STENT PLACEMENT Right 10/17/2022   Procedure: CYSTOSCOPY/URETEROSCOPY/HOLMIUM LASER/STENT PLACEMENT/RIGHT NEPHROSTOMY TUBE REMOVAL;  Surgeon: Christina Coyer, MD;  Location: [facility];  Service: Urology;  Laterality: Right;   CYSTOSCOPY/URETEROSCOPY/HOLMIUM LASER/STENT PLACEMENT Left 12/01/2022   Procedure: CYSTOSCOPY/URETEROSCOPY/HOLMIUM LASER/STENT PLACEMENT;  Surgeon: Scarlet Curly, MD;  Location: [facility];  Service: Urology;  Laterality: Left;   EXTRACORPOREAL SHOCK WAVE LITHOTRIPSY Left 08/07/2019   Procedure: EXTRACORPOREAL SHOCK WAVE LITHOTRIPSY (ESWL);  Surgeon: Roxane Copp, MD;  Location: [facility];  Service: Urology;  Laterality: Left;   EXTRACORPOREAL SHOCK WAVE LITHOTRIPSY Left 04/08/2020   Procedure: EXTRACORPOREAL SHOCK WAVE LITHOTRIPSY (ESWL);  Surgeon: Sherlyn Ditto, MD;  Location: [facility];  Service: Urology;  Laterality: Left;   EXTRACORPOREAL SHOCK WAVE LITHOTRIPSY Right 12/16/2020   Procedure: EXTRACORPOREAL SHOCK WAVE LITHOTRIPSY  (ESWL);  Surgeon: Erman Hayward, MD;  Location: [facility];  Service: Urology;  Laterality: Right;   HOLMIUM LASER APPLICATION Left 07/15/2012   Procedure: HOLMIUM LASER APPLICATION;  Surgeon: Soledad Dupes, MD;  Location: [facility];  Service: Urology;  Laterality: Left;   HOLMIUM LASER APPLICATION Right 08/11/2016   Procedure: HOLMIUM LASER APPLICATION;  Surgeon: Mark Ottelin, MD;  Location: [facility];  Service: Urology;  Laterality: Right;   HOLMIUM LASER APPLICATION Right 02/11/2019   Procedure: HOLMIUM LASER APPLICATION;  Surgeon: Christina Coyer, MD;  [facility]: [facility];  Service: Urology;  Laterality: Right;   IR NEPHROSTOMY PLACEMENT RIGHT  10/16/2022   TONSILLECTOMY  AS CHILD   URETEROSCOPY     Patient Active Problem List   Diagnosis Date Noted   Hydronephrosis 10/15/2022   Ureteral stone with hydronephrosis 03/05/2021   Anxiety 10/06/2020   Obesity 10/06/2020   Ureteral calculus 12/12/2019   Renal tubular acidosis type I 06/24/2019   Hydronephrosis concurrent with and due to calculi of kidney and ureter 05/08/2019   Migraine without aura and without status migrainosus, not intractable 11/19/2013   Other malaise and fatigue 03/14/2013   Hypokalemia 12/26/2012   Infection of urinary tract 07/02/2012   Ureteral calculus, left 07/01/2012   Fever, unspecified 07/01/2012    PCP: Elester Grim, MD  REFERRING PROVIDER: Reina Cara, DPM  THERAPY DIAG:  Pain in right foot - Plan: PT plan of care cert/re-cert  Muscle weakness - Plan: PT plan of care cert/re-cert  Pain in left foot - Plan: PT plan of care cert/re-cert  REFERRING DIAG: Plantar fascial fibromatosis [M72.2]   Rationale for Evaluation and Treatment:  Rehabilitation  SUBJECTIVE:  PERTINENT PAST HISTORY:  anxiety        PRECAUTIONS: None  WEIGHT BEARING  RESTRICTIONS No  FALLS:  Has patient fallen in last 6 months? No,  Number of falls: 0  MOI/History of condition:  Onset date: Re-occuring - about 8 months this bout  SUBJECTIVE STATEMENT  09/26/2023:  Pt reports that she has had some significant improvement in her pain.  She is no longer having first step pain.  She has been walking more at school with no issue.  She continues to have medial heel pain but this is improved as well.  She has a PRP injection scheduled for June 6th.  She feels she has a good understanding of her HEP and would like to D/C today.  EVAL:  Zeyna Mkrtchyan is a 48 y.o. female who presents to clinic with chief complaint of R plantar fascia pain with some pain now staring in L.  She has had bouts of PF for years.  This round started about 8 months ago.  She was starting to run and felt a pop in the bottom of her foot and her pain has been very severe since.  It is more sore after sitting or first thing in the morning but she feels that her current pain is different than the PF pain she has had in the past.  It encompass her entire heal.  It feels better after resting for a weekend.  She has been wearing orthotics which has helped.  She was in a boot and has tried several rounds of injections where were minimally helpful.   Red flags:  denies   Pain:  Are you having pain? 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 Mild distal Achilles tendinosis. 2. Mild plantar fasciitis of the medial band of the plantar fascia at the calcaneal insertion.  GENERAL OBSERVATION/GAIT:    SENSATION: Light touch:  Appears intact  PALPATION: Significant TTP near calcaneal insertion    LE MMT:  MMT Right (Eval) Left (Eval)  Hip flexion (L2, L3) 4+ 4+  Knee extension (L3) 4+ 4+  Knee flexion 4+ 4+  Hip abduction 4+ 4+  Hip extension    Hip external rotation    Hip internal rotation    Hip adduction    Ankle dorsiflexion (L4) N but painful N  Ankle plantarflexion (S1) n n  Ankle inversion n n  Ankle eversion n n  Great Toe ext (L5)    Grossly     (Blank rows = not tested, score listed is out of 5 possible points.  N = WNL, D = diminished, C = clear for gross weakness with myotome testing, * = concordant pain with testing)  LE ROM:  ROM Right (Eval) Left (Eval)  Hip flexion    Hip extension    Hip abduction    Hip adduction    Hip internal rotation    Hip external rotation    Knee extension    Knee flexion    Ankle dorsiflexion CC > 40 CC > 40  Ankle plantarflexion n n  Ankle inversion n n  Ankle eversion n n   (Blank rows = not tested, N =  WNL, * = concordant pain with testing)  Functional Tests  Eval        SL heel raise: R limited by pain                                                     TODAY'S TREATMENT:  [facility] Adult PT Treatment  09/26/2023   Therapeutic Exercise: Towel scrunch Foot rock paper scissors - 2x10 Seated Plantar Fascia stretch with towel Bil heel raise Eccentric heel raise SL heel raise SLS Reviewing and updating HEP  Therapeutic Activity  collecting information for goals, checking progress, and reviewing with patient   HOME EXERCISE PROGRAM: Access Code: [email or web address] URL: [email or web address] Date: 09/25/2023 Prepared by: Lesleigh Rash  Exercises - Long Sitting Plantar Fascia Stretch with Towel  - 1 x daily - 7 x weekly - 1 sets - 2 reps - 1 minutes hold - Single Leg Stance  - 2 x daily - 4-7 x weekly - 1 sets - 3 reps - 30-60 second hold - Heel Raises with Counter Support  - 1 x daily - 3 x weekly - 1  sets - 3 reps - 1 min hold - Standing Eccentric Heel Raise  - 1 x daily - 3 x weekly - 3 sets - 10 reps - Single Leg Heel Raise with Chair Support  - 1 x daily - 7 x weekly - 3 sets - 10 reps - Toe Yoga - Alternating Great Toe and Lesser Toe Extension  - 1 x daily - 7 x weekly - 10 reps - Seated Toe Towel Scrunches  - 1 x daily - 7 x weekly - 1 sets - 2 reps - 1 minute hold - Side Step Over  - 1 x daily - Walking Step Over  - 1 x daily  Treatment priorities   Eval        Progressive loading as tolerated                                          ASSESSMENT:  CLINICAL IMPRESSION:  09/26/2023: Bridgette Campus tolerated session well with no adverse reaction.  She has made significant progress with her pain and function since starting PT. She has met all goals.  She reports complete resolution of what she considers her PF pain and is no longer having pain with first steps in the morning.  She continues to have medial heel pain but this is improved as well.  She has been wearing supportive foot wear, orthotics, and has been working on slow progressive loading of PF.  We discussed progressions with ultimate goal of SL heel raise with minimal pain.  Also encouraged her to work continue to work on general R LE strengthening.  Robust HEP provided.   EVAL: Reed is a 48 y.o. female who presents to clinic with signs and sxs consistent with proximal R>L plantar pain.  MRI shows mild inflammation but no significant structural abnormalities.  Her strength and ankle ROM is excellent with exception of R heel raise which is limited by pain.  Considering she has had little benefit from rest and injections I suggested we try to gradually load.  She will start out with isometrics and foot intrinsic endurance work.  Pt will  benefit from skilled therapy to address relevant deficits and improve comfort in daily activities.   OBJECTIVE IMPAIRMENTS: Pain, plantar flexion strength  ACTIVITY LIMITATIONS: walking,  standing, squatting, housework, work  PERSONAL FACTORS: See medical history and pertinent history   REHAB POTENTIAL: Good  CLINICAL DECISION MAKING: Stable/uncomplicated  EVALUATION COMPLEXITY: Moderate   GOALS:   SHORT TERM GOALS: Target date: 08/24/2023   Sheritha will be >75% HEP compliant to improve carryover between sessions and facilitate independent management of condition  Evaluation: ongoing Goal status: MET   LONG TERM GOALS: Target date: 09/21/2023   Dajahnae will self report >/= 50% decrease in pain from evaluation to improve function in daily tasks  Evaluation/Baseline: 7/10 max pain 5/27:  80% improved Goal status: MET   2.  Kaena will be able to work, not limited by pain  Evaluation/Baseline: limited in standing at work 5/27: MET Goal status: MET   3.  Kyndel will report confidence in self management of condition at time of discharge with advanced HEP  Evaluation/Baseline: unable to self manage 5/27: MET Goal status: INITIAL  4.  Lexa will be able to complete 5x R SL heel raise, not limited by pain  Evaluation/Baseline: limited 5/27: MET Goal status: MET   PLAN: PT FREQUENCY: 1-2x/week  PT DURATION: 8 weeks  PLANNED INTERVENTIONS:  97164- PT Re-evaluation, 97110-Therapeutic exercises, 97530- Therapeutic activity, 97112- Neuromuscular re-education, 97535- Self Care, 21308- Manual therapy, [id number]- Gait training, [id number]- Aquatic Therapy, [id number]- Electrical stimulation (manual), [id number]- Vasopneumatic device, [id number]- Traction (mechanical), [id number]- Ionotophoresis 4mg /ml Dexamethasone , Taping, Dry Needling, Joint manipulation, and Spinal manipulation.   Zaineb Nowaczyk E Fumiko Cham PT 09/26/2023 8:15 AM

## 2023-10-01 ENCOUNTER — Telehealth: Payer: Self-pay | Admitting: Urology

## 2023-10-01 NOTE — Telephone Encounter (Signed)
 Pt called wanting to know if she can drive after her prp (right foot) on Friday with Dr. Marvis Sluder. 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   I sent Dr. Marvis Sluder this in a secure chat. His response was:   she can go to work as long as she is in the boot. Try and stay off of it as much as she can. Driving is probably ok but would recommend keeping it immobilized in the boot as much as possible. If she can have someone drive her Friday that would be ideal and give it time to rest over the weekend.  I called pt back and Left a message with what Dr. Caldwell Castles. Told her to call the office back if she had any further questions or concerns.

## 2023-10-05 ENCOUNTER — Encounter: Payer: Self-pay | Admitting: Podiatry

## 2023-10-05 ENCOUNTER — Ambulatory Visit: Admitting: Podiatry

## 2023-10-05 DIAGNOSIS — M79673 Pain in unspecified foot: Secondary | ICD-10-CM

## 2023-10-05 DIAGNOSIS — M722 Plantar fascial fibromatosis: Secondary | ICD-10-CM

## 2023-10-05 DIAGNOSIS — M216X1 Other acquired deformities of right foot: Secondary | ICD-10-CM

## 2023-10-05 NOTE — Progress Notes (Signed)
 Chief Complaint  Patient presents with   Plantar Fasciitis    Rm17 Plantar fasciitis right foot/patients says she feels some tenderness with pressure and walking.    HPI: 48 y.o. female presenting today for follow-up evaluation right foot plantar fasciitis.  She is here for PRP injection today.  Overall symptoms are unchanged.  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  Past Medical History:  Diagnosis Date   Anxiety    Depression    Eczema    H/O varicella    Hematuria    History of herpes genitalis    History of kidney stones    pt has long hx calcium phosphate stones   Hypocalcemia    Migraine    Pneumonia    once 10+ yeaers ago   Polycystic ovarian syndrome    PONV (postoperative nausea and vomiting)    Renal tubular acidosis type I    Right ureteral stone    Seasonal allergies    Urgency of urination     Past Surgical History:  Procedure Laterality Date   CYSTOSCOPY W/ RETROGRADES Right 12/12/2019   Procedure: CYSTOSCOPY WITH RIGHT RETROGRADE PYELOGRAM AND STENT PLACEMENT;  Surgeon: Florencio Hunting, MD;  Location: [facility];  Service: Urology;  Laterality: Right;   CYSTOSCOPY W/ URETERAL STENT PLACEMENT Left 07/01/2012   Procedure: CYSTOSCOPY WITH RETROGRADE PYELOGRAM/URETERAL STENT PLACEMENT;  Surgeon: Soledad Dupes, MD;  Location: [facility];  Service: Urology;  Laterality: Left;   CYSTOSCOPY W/ URETERAL STENT PLACEMENT Right 07/27/2016   Procedure: CYSTOSCOPY WITH RETROGRADE PYELOGRAM/URETERAL STENT PLACEMENT;  Surgeon: Erman Hayward, MD;  Location: [facility];  Service: Urology;  Laterality: Right;   CYSTOSCOPY W/ URETERAL STENT PLACEMENT Right 05/08/2019   Procedure: CYSTOSCOPY WITH RETROGRADE PYELOGRAM/URETERAL STENT PLACEMENT;  Surgeon: Marco Severs, MD;  Location: [facility];  Service: Urology;  Laterality: Right;   CYSTOSCOPY W/ URETERAL STENT PLACEMENT Right 03/05/2021   Procedure: CYSTOURETEROSCOPY WITH RETROGRADE  PYELOGRAM/URETERAL STENT PLACEMENT RIGHT;  Surgeon: Marco Severs, MD;  [facility]: [facility];  Service: Urology;  Laterality: Right;   CYSTOSCOPY WITH RETROGRADE PYELOGRAM, URETEROSCOPY AND STENT PLACEMENT Left 07/15/2012   Procedure: CYSTOSCOPY WITH left RETROGRADE PYELOGRAM,left  URETEROSCOPY AND left STENT exchange ;  Surgeon: Soledad Dupes, MD;  Location: [facility];  Service: Urology;  Laterality: Left;  LEFT URETER STENT EXCHANGE     CYSTOSCOPY WITH RETROGRADE PYELOGRAM, URETEROSCOPY AND STENT PLACEMENT Right 08/11/2016   Procedure: CYSTOSCOPY WITH RIGHT RETROGRADE PYELOGRAM,RIGHT  URETEROSCOPY AND STENT CHANGE;  Surgeon: Mark Ottelin, MD;  Location: [facility];  Service: Urology;  Laterality: Right;   CYSTOSCOPY WITH URETEROSCOPY AND STENT PLACEMENT Right 07/12/2021   Procedure: CYSTOSCOPY WITH RIGHT URETEROSCOPY AND STENT PLACEMENT;  Surgeon: Christina Coyer, MD;  [facility];  Service: Urology;  Laterality: Right;   CYSTOSCOPY/RETROGRADE/URETEROSCOPY/STONE EXTRACTION WITH BASKET  AGE 49   CYSTOSCOPY/URETEROSCOPY/HOLMIUM LASER/STENT PLACEMENT Bilateral 09/19/2018   Procedure: CYSTOSCOPY/RETROGRADE/URETEROSCOPY/HOLMIUM LASER/STENT PLACEMENT;  Surgeon: Christina Coyer, MD;  [facility];  Service: Urology;  Laterality: Bilateral;   CYSTOSCOPY/URETEROSCOPY/HOLMIUM LASER/STENT PLACEMENT Right 10/22/2018   Procedure: CYSTOSCOPY/RETROGRADE/URETEROSCOPY/HOLMIUM LASER/STENT PLACEMENT;  Surgeon: Christina Coyer, MD;  Location: [facility];  Service: Urology;  Laterality: Right;  ONLY NEEDS 60 MIN   CYSTOSCOPY/URETEROSCOPY/HOLMIUM LASER/STENT PLACEMENT Right 02/11/2019   Procedure: CYSTOSCOPY/RETROGRADE/URETEROSCOPY/HOLMIUM LASER/STENT PLACEMENT;  Surgeon: Christina Coyer, MD;  Location: [facility];  Service: Urology;  Laterality: Right;   CYSTOSCOPY/URETEROSCOPY/HOLMIUM LASER/STENT  PLACEMENT Bilateral 06/03/2019    Procedure: CYSTOSCOPY/RETROGRADE/URETEROSCOPY/HOLMIUM LASER/STENT PLACEMENT;  Surgeon: Christina Coyer, MD;  Location: [facility];  Service: Urology;  Laterality: Bilateral;   CYSTOSCOPY/URETEROSCOPY/HOLMIUM LASER/STENT PLACEMENT Right 12/30/2019   Procedure: CYSTOSCOPY, RETROGRADE /URETEROSCOPY/HOLMIUM LASER/STENT EXCHANGE;  Surgeon: Christina Coyer, MD;  Location: [facility];  Service: Urology;  Laterality: Right;   CYSTOSCOPY/URETEROSCOPY/HOLMIUM LASER/STENT PLACEMENT Right 10/17/2022   Procedure: CYSTOSCOPY/URETEROSCOPY/HOLMIUM LASER/STENT PLACEMENT/RIGHT NEPHROSTOMY TUBE REMOVAL;  Surgeon: Christina Coyer, MD;  [facility];  Service: Urology;  Laterality: Right;   CYSTOSCOPY/URETEROSCOPY/HOLMIUM LASER/STENT PLACEMENT Left 12/01/2022   Procedure: CYSTOSCOPY/URETEROSCOPY/HOLMIUM LASER/STENT PLACEMENT;  Surgeon: Scarlet Curly, MD;  Location[facility];  Service: Urology;  Laterality: Left;   EXTRACORPOREAL SHOCK WAVE LITHOTRIPSY Left 08/07/2019   Procedure: EXTRACORPOREAL SHOCK WAVE LITHOTRIPSY (ESWL);  Surgeon: Roxane Copp, MD;  Location: [facility];  Service: Urology;  Laterality: Left;   EXTRACORPOREAL SHOCK WAVE LITHOTRIPSY Left 04/08/2020   Procedure: EXTRACORPOREAL SHOCK WAVE LITHOTRIPSY (ESWL);  Surgeon: Sherlyn Ditto, MD;  Location: [facility];  Service: Urology;  Laterality: Left;   EXTRACORPOREAL SHOCK WAVE LITHOTRIPSY Right 12/16/2020   Procedure: EXTRACORPOREAL SHOCK WAVE LITHOTRIPSY (ESWL);  Surgeon: Erman Hayward, MD;  Location: [facility];  Service: Urology;  Laterality: Right;   HOLMIUM LASER APPLICATION Left 07/15/2012   Procedure: HOLMIUM LASER APPLICATION;  Surgeon: Soledad Dupes, MD;  Location: [facility];  Service: Urology;  Laterality: Left;   HOLMIUM LASER APPLICATION Right 08/11/2016   Procedure: HOLMIUM LASER APPLICATION;  Surgeon: Mark Ottelin,  MD;  Location: [facility];  Service: Urology;  Laterality: Right;   HOLMIUM LASER APPLICATION Right 02/11/2019   Procedure: HOLMIUM LASER APPLICATION;  Surgeon: Christina Coyer, MD;  Location: [facility];  Service: Urology;  Laterality: Right;   IR NEPHROSTOMY PLACEMENT RIGHT  10/16/2022   TONSILLECTOMY  AS CHILD   URETEROSCOPY      Allergies  Allergen Reactions   Adhesive [Tape] Rash    Pt states paper tape causes rash, but "plastic tape" is not problematic.      Physical Exam: General: The patient is alert and oriented x3 in no acute distress.  Dermatology:  No ecchymosis, erythema, or edema bilateral.  No open lesions.    Vascular: Palpable pedal pulses bilaterally. Capillary refill within normal limits.  No appreciable edema.    Neurological: Light touch sensation intact bilateral.  MMT 5/5 to lower extremity bilateral. Negative Tinel's sign with percussion of the posterior tibial nerve on the affected extremity.    Musculoskeletal Exam:  There is pain on palpation of the plantarmedial & plantarcentral aspect of right heel.  No gaps or nodules within the plantar fascia today.  Antalgic gait noted with first few steps upon standing.  No pain on palpation of achilles tendon bilateral.  Ankle df less than 10 degrees with knee extended b/l. Early heel rise on gait, patient does report hamstring tightness.  Radiographic Exam: Right foot 04/27/23 Normal osseous mineralization. Joint spaces preserved.  No fractures or osseous irregularities noted.  MRI right foot: 07/01/2023 IMPRESSION: 1. Mild distal Achilles tendinosis. 2. Mild plantar fasciitis of the medial band of the plantar fascia at the calcaneal insertion.  Assessment/Plan of Care: 1. Plantar fascial fibromatosis   2. Acquired equinus deformity of right foot       No orders of the defined types were placed in this encounter.  None  Consent obtained to proceed with PRP injection to the  right foot.  Right foot was anesthetized with 3 cc of a one-to-one ratio of 0.5% Marcaine  plain and 2% lidocaine  plain after alcohol skin prep.  Venous blood was drawn from patient's arm.  The specimen was centrifuged and approximately 3 cc of PRP was obtained.  The right plantar fascia was injected with the PRP via 18-gauge needle the medial approach following ChloraPrep skin prep.  Patient tolerated this well.  Compressive bandage was applied consisting of 4 x 4 gauze, light Coban.  Advised patient to maintain his bandage for at least 24 hours and continue with compressive bandage.  New cam boot was dispensed to the patient today.  She did previously have a cam boot which had been over worn due to duration of use and was no longer functioning properly.  Advised patient to limit weightbearing is much as possible for the first 2 to 3 days and keep foot elevated.  Otherwise maintain cam boot is much as possible and may begin to weight-bear as tolerated over the next week.  Avoid any NSAIDs for the next week.  Follow-up in 1 week for reevaluation.   Return in about 1 week (around 10/12/2023) for Plantar Fasciitis/sp PRP.   Eve Hinders, DPM, AACFAS [facility]     [address] [place], [place] [address]                Office [phone]  Fax [phone]

## 2023-10-11 ENCOUNTER — Encounter: Payer: Self-pay | Admitting: Podiatry

## 2023-10-11 ENCOUNTER — Ambulatory Visit: Admitting: Podiatry

## 2023-10-11 VITALS — Ht 62.0 in | Wt 172.0 lb

## 2023-10-11 DIAGNOSIS — M722 Plantar fascial fibromatosis: Secondary | ICD-10-CM

## 2023-10-14 NOTE — Progress Notes (Signed)
 Chief Complaint  Patient presents with   Foot Orthotics    Pt is here to f/u on PRP that she had done on 6/6 pt states her foot at the end of the day is a little tender but has no pain at all, continues to wear boot just for safety.    HPI: 48 y.o. female presenting today for follow-up evaluation right foot plantar fasciitis.  She had PRP injection on 6/6.  She presents ambulating in the cam walker boot.  She reports doing very well from a pain standpoint, does have some tenderness however her pain is much improved.  Past Medical History:  Diagnosis Date   Anxiety    Depression    Eczema    H/O varicella    Hematuria    History of herpes genitalis    History of kidney stones    pt has long hx calcium phosphate stones   Hypocalcemia    Migraine    Pneumonia    once 10+ yeaers ago   Polycystic ovarian syndrome    PONV (postoperative nausea and vomiting)    Renal tubular acidosis type I    Right ureteral stone    Seasonal allergies    Urgency of urination     Past Surgical History:  Procedure Laterality Date   CYSTOSCOPY W/ RETROGRADES Right 12/12/2019   Procedure: CYSTOSCOPY WITH RIGHT RETROGRADE PYELOGRAM AND STENT PLACEMENT;  Surgeon: Florencio Hunting, MD;  [facility];  Service: Urology;  Laterality: Right;   CYSTOSCOPY W/ URETERAL STENT PLACEMENT Left 07/01/2012   Procedure: CYSTOSCOPY WITH RETROGRADE PYELOGRAM/URETERAL STENT PLACEMENT;  Surgeon: Soledad Dupes, MD;  Location: [facility];  Service: Urology;  Laterality: Left;   CYSTOSCOPY W/ URETERAL STENT PLACEMENT Right 07/27/2016   Procedure: CYSTOSCOPY WITH RETROGRADE PYELOGRAM/URETERAL STENT PLACEMENT;  Surgeon: Erman Hayward, MD;  Location[facility];  Service: Urology;  Laterality: Right;   CYSTOSCOPY W/ URETERAL STENT PLACEMENT Right 05/08/2019   Procedure: CYSTOSCOPY WITH RETROGRADE PYELOGRAM/URETERAL STENT PLACEMENT;  Surgeon: Marco Severs, MD;  Location[facility];  Service: Urology;   Laterality: Right;   CYSTOSCOPY W/ URETERAL STENT PLACEMENT Right 03/05/2021   Procedure: CYSTOURETEROSCOPY WITH RETROGRADE PYELOGRAM/URETERAL STENT PLACEMENT RIGHT;  Surgeon: Marco Severs, MD;  [facility];  Service: Urology;  Laterality: Right;   CYSTOSCOPY WITH RETROGRADE PYELOGRAM, URETEROSCOPY AND STENT PLACEMENT Left 07/15/2012   Procedure: CYSTOSCOPY WITH left RETROGRADE PYELOGRAM,left  URETEROSCOPY AND left STENT exchange ;  Surgeon: Soledad Dupes, MD;  Location: [facility];  Service: Urology;  Laterality: Left;  LEFT URETER STENT EXCHANGE     CYSTOSCOPY WITH RETROGRADE PYELOGRAM, URETEROSCOPY AND STENT PLACEMENT Right 08/11/2016   Procedure: CYSTOSCOPY WITH RIGHT RETROGRADE PYELOGRAM,RIGHT  URETEROSCOPY AND STENT CHANGE;  Surgeon: Mark Ottelin, MD;  Location: [facility];  Service: Urology;  Laterality: Right;   CYSTOSCOPY WITH URETEROSCOPY AND STENT PLACEMENT Right 07/12/2021   Procedure: CYSTOSCOPY WITH RIGHT URETEROSCOPY AND STENT PLACEMENT;  Surgeon: Christina Coyer, MD;  [facility];  Service: Urology;  Laterality: Right;   CYSTOSCOPY/RETROGRADE/URETEROSCOPY/STONE EXTRACTION WITH BASKET  AGE 62   CYSTOSCOPY/URETEROSCOPY/HOLMIUM LASER/STENT PLACEMENT Bilateral 09/19/2018   Procedure: CYSTOSCOPY/RETROGRADE/URETEROSCOPY/HOLMIUM LASER/STENT PLACEMENT;  Surgeon: Christina Coyer, MD;  Location[facility];  Service: Urology;  Laterality: Bilateral;   CYSTOSCOPY/URETEROSCOPY/HOLMIUM LASER/STENT PLACEMENT Right 10/22/2018   Procedure: CYSTOSCOPY/RETROGRADE/URETEROSCOPY/HOLMIUM LASER/STENT PLACEMENT;  Surgeon: Christina Coyer, MD;  Location: [facility];  Service: Urology;  Laterality: Right;  ONLY NEEDS 60 MIN  CYSTOSCOPY/URETEROSCOPY/HOLMIUM LASER/STENT PLACEMENT Right 02/11/2019   Procedure: CYSTOSCOPY/RETROGRADE/URETEROSCOPY/HOLMIUM LASER/STENT PLACEMENT;  Surgeon: Christina Coyer, MD;  Location: [facility];  Service: Urology;  Laterality: Right;   CYSTOSCOPY/URETEROSCOPY/HOLMIUM LASER/STENT PLACEMENT Bilateral 06/03/2019   Procedure: CYSTOSCOPY/RETROGRADE/URETEROSCOPY/HOLMIUM LASER/STENT PLACEMENT;  Surgeon: Christina Coyer, MD;  Location: [facility];  Service: Urology;  Laterality: Bilateral;   CYSTOSCOPY/URETEROSCOPY/HOLMIUM LASER/STENT PLACEMENT Right 12/30/2019   Procedure: CYSTOSCOPY, RETROGRADE /URETEROSCOPY/HOLMIUM LASER/STENT EXCHANGE;  Surgeon: Christina Coyer, MD;  Location: [facility];  Service: Urology;  Laterality: Right;   CYSTOSCOPY/URETEROSCOPY/HOLMIUM LASER/STENT PLACEMENT Right 10/17/2022   Procedure: CYSTOSCOPY/URETEROSCOPY/HOLMIUM LASER/STENT PLACEMENT/RIGHT NEPHROSTOMY TUBE REMOVAL;  Surgeon: Christina Coyer, MD;  [facility];  Service: Urology;  Laterality: Right;   CYSTOSCOPY/URETEROSCOPY/HOLMIUM LASER/STENT PLACEMENT Left 12/01/2022   Procedure: CYSTOSCOPY/URETEROSCOPY/HOLMIUM LASER/STENT PLACEMENT;  Surgeon: Scarlet Curly, MD;  Location[facility];  Service: Urology;  Laterality: Left;   EXTRACORPOREAL SHOCK WAVE LITHOTRIPSY Left 08/07/2019   Procedure: EXTRACORPOREAL SHOCK WAVE LITHOTRIPSY (ESWL);  Surgeon: Roxane Copp, MD;  Location: [facility];  Service: Urology;  Laterality: Left;   EXTRACORPOREAL SHOCK WAVE LITHOTRIPSY Left 04/08/2020   Procedure: EXTRACORPOREAL SHOCK WAVE LITHOTRIPSY (ESWL);  Surgeon: Sherlyn Ditto, MD;  Location: [facility];  Service: Urology;  Laterality: Left;   EXTRACORPOREAL SHOCK WAVE LITHOTRIPSY Right 12/16/2020   Procedure: EXTRACORPOREAL SHOCK WAVE LITHOTRIPSY (ESWL);  Surgeon: Erman Hayward, MD;  Location: [facility];  Service: Urology;  Laterality: Right;   HOLMIUM LASER APPLICATION Left 07/15/2012   Procedure: HOLMIUM LASER APPLICATION;  Surgeon: Soledad Dupes, MD;  Location: [facility];  Service: Urology;   Laterality: Left;   HOLMIUM LASER APPLICATION Right 08/11/2016   Procedure: HOLMIUM LASER APPLICATION;  Surgeon: Mark Ottelin, MD;  Location: [facility];  Service: Urology;  Laterality: Right;   HOLMIUM LASER APPLICATION Right 02/11/2019   Procedure: HOLMIUM LASER APPLICATION;  Surgeon: Christina Coyer, MD;  Location: [facility];  Service: Urology;  Laterality: Right;   IR NEPHROSTOMY PLACEMENT RIGHT  10/16/2022   TONSILLECTOMY  AS CHILD   URETEROSCOPY      Allergies  Allergen Reactions   Adhesive [Tape] Rash    Pt states paper tape causes rash, but plastic tape is not problematic.      Physical Exam: General: The patient is alert and oriented x3 in no acute distress.  Dermatology:  No ecchymosis, erythema, or edema bilateral.  No open lesions.    Vascular: Palpable pedal pulses bilaterally. Capillary refill within normal limits.  No appreciable edema.    Neurological: Light touch sensation intact bilateral.  MMT 5/5 to lower extremity bilateral. Negative Tinel's sign with percussion of the posterior tibial nerve on the affected extremity.    Musculoskeletal Exam:  There is decreased pain on palpation of the plantarmedial & plantarcentral aspect of right heel.  No gaps or nodules within the plantar fascia today.  Antalgic gait noted with first few steps upon standing.  No pain on palpation of achilles tendon bilateral.  Ankle df less than 10 degrees with knee extended b/l. Early heel rise on gait, patient does report hamstring tightness.   Assessment/Plan of Care: 1. Plantar fascial fibromatosis       No orders of the defined types were placed in this encounter.  None  Patient reports very good improvement of her symptoms.  Does have some residual tenderness.  She has been protected weightbearing in the cam boot.  At this point can return to  weightbearing as tolerated, wean out of the boot into good supportive shoe.  Continue home stretching  regimen in the interim.  Will have her follow-up in about 3 weeks, if there is any residual symptoms, we will determine whether to proceed with a second PRP injection.    Return in about 3 weeks (around 11/01/2023) for Plantar Fasciitis.   Eve Hinders, DPM, AACFAS [facility]     [address] [place], [place] [address]                Office [phone]  Fax [phone]

## 2023-11-08 ENCOUNTER — Ambulatory Visit: Admitting: Podiatry

## 2023-11-20 ENCOUNTER — Inpatient Hospital Stay (HOSPITAL_COMMUNITY): Admitting: Anesthesiology

## 2023-11-20 ENCOUNTER — Inpatient Hospital Stay (HOSPITAL_COMMUNITY)

## 2023-11-20 ENCOUNTER — Encounter (HOSPITAL_COMMUNITY)
Admission: RE | Disposition: A | Discharge status: Home / Self Care | Payer: Self-pay | Source: Ambulatory Visit | Attending: Urology

## 2023-11-20 ENCOUNTER — Other Ambulatory Visit: Payer: Self-pay | Admitting: Urology

## 2023-11-20 ENCOUNTER — Ambulatory Visit (HOSPITAL_COMMUNITY)
Admission: RE | Admit: 2023-11-20 | Discharge: 2023-11-20 | Disposition: A | Discharge status: Home / Self Care | Source: Ambulatory Visit | Attending: Urology | Admitting: Urology

## 2023-11-20 ENCOUNTER — Encounter (HOSPITAL_COMMUNITY): Payer: Self-pay | Admitting: Urology

## 2023-11-20 DIAGNOSIS — N201 Calculus of ureter: Secondary | ICD-10-CM

## 2023-11-20 HISTORY — PX: CYSTOSCOPY W/ URETERAL STENT PLACEMENT: SHX1429

## 2023-11-20 LAB — POCT I-STAT EG7
Acid-base deficit: 12 mmol/L — ABNORMAL HIGH (ref 0.0–2.0)
Bicarbonate: 13 mmol/L — ABNORMAL LOW (ref 20.0–28.0)
Calcium, Ion: 1.31 mmol/L (ref 1.15–1.40)
HCT: 33 % — ABNORMAL LOW (ref 36.0–46.0)
Hemoglobin: 11.2 g/dL — ABNORMAL LOW (ref 12.0–15.0)
O2 Saturation: 66 %
Patient temperature: 36
Potassium: 3.6 mmol/L (ref 3.5–5.1)
Sodium: 142 mmol/L (ref 135–145)
TCO2: 14 mmol/L — ABNORMAL LOW (ref 22–32)
pCO2, Ven: 26.1 mmHg — ABNORMAL LOW (ref 44–60)
pH, Ven: 7.3 (ref 7.25–7.43)
pO2, Ven: 35 mmHg (ref 32–45)

## 2023-11-20 SURGERY — CYSTOSCOPY, WITH RETROGRADE PYELOGRAM AND URETERAL STENT INSERTION
Anesthesia: General | Site: Ureter | Laterality: Bilateral

## 2023-11-20 MED ORDER — TAMSULOSIN HCL 0.4 MG PO CAPS: 0.4000 mg | ORAL_CAPSULE | Freq: Every day | ORAL | 0 refills | Status: AC

## 2023-11-20 MED ORDER — OXYCODONE HCL 5 MG/5ML PO SOLN: 5.0000 mg | Freq: Once | ORAL | Status: DC | PRN

## 2023-11-20 MED ORDER — FENTANYL CITRATE (PF) 100 MCG/2ML IJ SOLN
INTRAMUSCULAR | Status: AC
  Filled 2023-11-20: qty 2

## 2023-11-20 MED ORDER — PROPOFOL 10 MG/ML IV BOLUS
INTRAVENOUS | Status: DC | PRN
  Administered 2023-11-20: 160 mg via INTRAVENOUS

## 2023-11-20 MED ORDER — LIDOCAINE HCL (CARDIAC) PF 100 MG/5ML IV SOSY
PREFILLED_SYRINGE | INTRAVENOUS | Status: DC | PRN
  Administered 2023-11-20: 60 mg via INTRAVENOUS

## 2023-11-20 MED ORDER — SCOPOLAMINE 1 MG/3DAYS TD PT72
MEDICATED_PATCH | TRANSDERMAL | Status: AC
  Filled 2023-11-20: qty 1

## 2023-11-20 MED ORDER — FENTANYL CITRATE (PF) 100 MCG/2ML IJ SOLN
INTRAMUSCULAR | Status: DC | PRN
  Administered 2023-11-20: 50 ug via INTRAVENOUS
  Administered 2023-11-20 (×2): 25 ug via INTRAVENOUS

## 2023-11-20 MED ORDER — ACETAMINOPHEN 500 MG PO TABS
1000.0000 mg | ORAL_TABLET | Freq: Once | ORAL | Status: AC
  Administered 2023-11-20: 1000 mg via ORAL
  Filled 2023-11-20: qty 2

## 2023-11-20 MED ORDER — CEFAZOLIN SODIUM-DEXTROSE 2-4 GM/100ML-% IV SOLN
2.0000 g | INTRAVENOUS | Status: AC
  Administered 2023-11-20: 2 g via INTRAVENOUS
  Filled 2023-11-20: qty 100

## 2023-11-20 MED ORDER — IOHEXOL 300 MG/ML  SOLN
INTRAMUSCULAR | Status: DC | PRN
  Administered 2023-11-20: 10 mL

## 2023-11-20 MED ORDER — HYOSCYAMINE SULFATE 0.125 MG PO TBDP: 0.1250 mg | ORAL_TABLET | Freq: Four times a day (QID) | ORAL | 0 refills | Status: AC | PRN

## 2023-11-20 MED ORDER — DEXAMETHASONE SODIUM PHOSPHATE 4 MG/ML IJ SOLN
INTRAMUSCULAR | Status: DC | PRN
  Administered 2023-11-20: 10 mg via INTRAVENOUS

## 2023-11-20 MED ORDER — METHOCARBAMOL 750 MG PO TABS: 750.0000 mg | ORAL_TABLET | Freq: Four times a day (QID) | ORAL | 0 refills | Status: AC

## 2023-11-20 MED ORDER — SODIUM CHLORIDE 0.9 % IR SOLN
Status: DC | PRN
  Administered 2023-11-20: 3000 mL

## 2023-11-20 MED ORDER — ORAL CARE MOUTH RINSE: 15.0000 mL | Freq: Once | OROMUCOSAL | Status: AC

## 2023-11-20 MED ORDER — MIDAZOLAM HCL 5 MG/5ML IJ SOLN
INTRAMUSCULAR | Status: DC | PRN
  Administered 2023-11-20: 2 mg via INTRAVENOUS

## 2023-11-20 MED ORDER — OXYCODONE HCL 5 MG PO TABS: 5.0000 mg | ORAL_TABLET | Freq: Once | ORAL | Status: DC | PRN

## 2023-11-20 MED ORDER — FENTANYL CITRATE PF 50 MCG/ML IJ SOSY: 25.0000 ug | PREFILLED_SYRINGE | INTRAMUSCULAR | Status: DC | PRN

## 2023-11-20 MED ORDER — CHLORHEXIDINE GLUCONATE 0.12 % MT SOLN
15.0000 mL | Freq: Once | OROMUCOSAL | Status: AC
  Administered 2023-11-20: 15 mL via OROMUCOSAL

## 2023-11-20 MED ORDER — AMISULPRIDE (ANTIEMETIC) 5 MG/2ML IV SOLN: 10.0000 mg | Freq: Once | INTRAVENOUS | Status: DC | PRN

## 2023-11-20 MED ORDER — SCOPOLAMINE 1 MG/3DAYS TD PT72
1.0000 | MEDICATED_PATCH | TRANSDERMAL | Status: DC
  Administered 2023-11-20: 1 via TRANSDERMAL

## 2023-11-20 MED ORDER — ONDANSETRON HCL 4 MG/2ML IJ SOLN
INTRAMUSCULAR | Status: DC | PRN
  Administered 2023-11-20: 4 mg via INTRAVENOUS

## 2023-11-20 MED ORDER — MIDAZOLAM HCL 2 MG/2ML IJ SOLN
INTRAMUSCULAR | Status: AC
  Filled 2023-11-20: qty 2

## 2023-11-20 MED ORDER — LACTATED RINGERS IV SOLN: INTRAVENOUS | Status: DC

## 2023-11-20 SURGICAL SUPPLY — 20 items
BAG URO CATCHER STRL LF (MISCELLANEOUS) ×1 IMPLANT
BASKET STONE NCOMPASS (UROLOGICAL SUPPLIES) IMPLANT
BASKET ZERO TIP NITINOL 2.4FR (BASKET) IMPLANT
CATH URETL OPEN 5X70 (CATHETERS) IMPLANT
CLOTH BEACON ORANGE TIMEOUT ST (SAFETY) ×2 IMPLANT
EXTRACTOR STONE 1.7FRX115CM (UROLOGICAL SUPPLIES) IMPLANT
FIBER LASER MOSES 200 DFL (Laser) IMPLANT
FIBER LASER MOSES 365 DFL (Laser) IMPLANT
GLOVE BIO SURGEON STRL SZ8 (GLOVE) ×1 IMPLANT
GOWN STRL REUS W/ TWL XL LVL3 (GOWN DISPOSABLE) ×2 IMPLANT
GUIDEWIRE ANG ZIPWIRE 038X150 (WIRE) IMPLANT
GUIDEWIRE STR DUAL SENSOR (WIRE) ×1 IMPLANT
KIT TURNOVER KIT A (KITS) ×2 IMPLANT
MANIFOLD NEPTUNE II (INSTRUMENTS) ×1 IMPLANT
PACK CYSTO (CUSTOM PROCEDURE TRAY) ×1 IMPLANT
SHEATH NAVIGATOR HD 11/13X28 (SHEATH) IMPLANT
SHEATH NAVIGATOR HD 11/13X36 (SHEATH) ×1 IMPLANT
STENT URET 6FRX24 CONTOUR (STENTS) IMPLANT
TUBING CONNECTING 10 (TUBING) ×1 IMPLANT
TUBING UROLOGY SET (TUBING) ×2 IMPLANT

## 2023-11-20 NOTE — Discharge Instructions (Addendum)
 DISCHARGE INSTRUCTIONS FOR Ureteroscopy and/or Ureteral Stent Placement  MEDICATIONS:  1.  Robaxin  2. Tamsulosin   3. Hyoscyamine    ACTIVITY:  1. No strenuous activity x 1week  2. No driving while on narcotic pain medications  3. Drink plenty of water   4. Continue to walk at home - it is normal to see blood in the urine while the stent is in place, so keep active, but don't over do it.  5. May return to work/school tomorrow or when you feel ready  6. You may experience some pain when urinating in the kidney on the side that was operated on while the stent is in place this is normal  WHAT IS NORMAL TO EXPERIENCE: It is normal to feel the urge to urinate while the stent is in place It is normal to have blood in your urine while the stent is in place  It sometimes can be normal to have pain in your kidney when you urinate   BATHING:  1. You can shower and we recommend daily showers    DIET: You may return to your normal diet immediately. Because of the raw surface of your bladder, alcohol, spicy foods, acid type foods and drinks with caffeine may cause irritation or frequency and should be used in moderation. To keep your urine flowing freely and to avoid constipation, drink plenty of fluids during the day ( 8-10 glasses ). Tip: Avoid cranberry juice because it is very acidic.  SIGNS/SYMPTOMS TO CALL:  Please call us  if you have a fever greater than 101.5, uncontrolled nausea/vomiting, uncontrolled pain, dizziness, unable to urinate, bloody urine with clots greater than the size of a quarter, chest pain, shortness of breath, leg swelling, leg pain, redness around wound, drainage from wound, or any other concerns or questions.   You can reach us  at [phone].   FOLLOW-UP:  1. You you have been set up for f/u in 6-8 weeks  2. You will have your stent removed in clinic in 2 weeks

## 2023-11-20 NOTE — H&P (Signed)
 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 For further history please refer to charted history   Past Medical History:  Diagnosis Date   Anxiety    Depression    Eczema    H/O varicella    Hematuria    History of herpes genitalis    History of kidney stones    pt has long hx calcium phosphate stones   Hypocalcemia    Migraine    Pneumonia    once 10+ yeaers ago   Polycystic ovarian syndrome    PONV (postoperative nausea and vomiting)    Renal tubular acidosis type I    Right ureteral stone    Seasonal allergies    Urgency of urination    Past Surgical History:  Procedure Laterality Date   CYSTOSCOPY W/ RETROGRADES Right 12/12/2019   Procedure: CYSTOSCOPY WITH RIGHT RETROGRADE PYELOGRAM AND STENT PLACEMENT;  Surgeon: Renda Glance, MD;  Location: [facility];  Service: Urology;  Laterality: Right;   CYSTOSCOPY W/ URETERAL STENT PLACEMENT Left 07/01/2012   Procedure: CYSTOSCOPY WITH RETROGRADE PYELOGRAM/URETERAL STENT PLACEMENT;  Surgeon: Toribio Neysa Repine, MD;  Location: [facility];  Service: Urology;  Laterality: Left;   CYSTOSCOPY W/ URETERAL STENT PLACEMENT Right 07/27/2016   Procedure: CYSTOSCOPY WITH RETROGRADE PYELOGRAM/URETERAL STENT PLACEMENT;  Surgeon: Glendia Elizabeth, MD;  Location: [facility];  Service: Urology;  Laterality: Right;   CYSTOSCOPY W/ URETERAL STENT PLACEMENT Right 05/08/2019   Procedure: CYSTOSCOPY WITH RETROGRADE PYELOGRAM/URETERAL STENT PLACEMENT;  Surgeon: Sherrilee Belvie CROME, MD;  Location: [facility];  Service: Urology;  Laterality: Right;   CYSTOSCOPY W/ URETERAL STENT PLACEMENT Right 03/05/2021    Procedure: CYSTOURETEROSCOPY WITH RETROGRADE PYELOGRAM/URETERAL STENT PLACEMENT RIGHT;  Surgeon: Sherrilee Belvie CROME, MD;  Location: [facility];  Service: Urology;  Laterality: Right;   CYSTOSCOPY WITH RETROGRADE PYELOGRAM, URETEROSCOPY AND STENT PLACEMENT Left 07/15/2012   Procedure: CYSTOSCOPY WITH left RETROGRADE PYELOGRAM,left  URETEROSCOPY AND left STENT exchange ;  Surgeon: Toribio Neysa Repine, MD;  Location: [facility];  Service: Urology;  Laterality: Left;  LEFT URETER STENT EXCHANGE     CYSTOSCOPY WITH RETROGRADE PYELOGRAM, URETEROSCOPY AND STENT PLACEMENT Right 08/11/2016   Procedure: CYSTOSCOPY WITH RIGHT RETROGRADE PYELOGRAM,RIGHT  URETEROSCOPY AND STENT CHANGE;  Surgeon: Mark Ottelin, MD;  Location: [facility];  Service: Urology;  Laterality: Right;   CYSTOSCOPY WITH URETEROSCOPY AND STENT PLACEMENT Right 07/12/2021   Procedure: CYSTOSCOPY WITH RIGHT URETEROSCOPY AND STENT PLACEMENT;  Surgeon: Nieves Cough, MD;  Location: [facility];  Service: Urology;  Laterality: Right;   CYSTOSCOPY/RETROGRADE/URETEROSCOPY/STONE EXTRACTION WITH BASKET  AGE 15   CYSTOSCOPY/URETEROSCOPY/HOLMIUM LASER/STENT PLACEMENT Bilateral 09/19/2018   Procedure: CYSTOSCOPY/RETROGRADE/URETEROSCOPY/HOLMIUM LASER/STENT PLACEMENT;  Surgeon: Nieves Cough, MD;  Location: [facility];  Service: Urology;  Laterality: Bilateral;   CYSTOSCOPY/URETEROSCOPY/HOLMIUM LASER/STENT PLACEMENT Right 10/22/2018   Procedure: CYSTOSCOPY/RETROGRADE/URETEROSCOPY/HOLMIUM LASER/STENT PLACEMENT;  Surgeon: Nieves Cough, MD;  Location: [facility];  Service: Urology;  Laterality: Right;  ONLY NEEDS 60 MIN   CYSTOSCOPY/URETEROSCOPY/HOLMIUM LASER/STENT PLACEMENT Right 02/11/2019   Procedure: CYSTOSCOPY/RETROGRADE/URETEROSCOPY/HOLMIUM LASER/STENT PLACEMENT;  Surgeon: Nieves Cough, MD;  Location: [facility];  Service: Urology;  Laterality: Right;    CYSTOSCOPY/URETEROSCOPY/HOLMIUM LASER/STENT PLACEMENT Bilateral 06/03/2019   Procedure: CYSTOSCOPY/RETROGRADE/URETEROSCOPY/HOLMIUM LASER/STENT PLACEMENT;  Surgeon: Nieves Cough,  MD;  Location: [facility];  Service: Urology;  Laterality: Bilateral;   CYSTOSCOPY/URETEROSCOPY/HOLMIUM LASER/STENT PLACEMENT Right 12/30/2019   Procedure: CYSTOSCOPY, RETROGRADE /URETEROSCOPY/HOLMIUM LASER/STENT EXCHANGE;  Surgeon: Nieves Cough, MD;  Location: [facility];  Service: Urology;  Laterality: Right;   CYSTOSCOPY/URETEROSCOPY/HOLMIUM LASER/STENT PLACEMENT Right 10/17/2022   Procedure: CYSTOSCOPY/URETEROSCOPY/HOLMIUM LASER/STENT PLACEMENT/RIGHT NEPHROSTOMY TUBE REMOVAL;  Surgeon: Nieves Cough, MD;  Location: [facility];  Service: Urology;  Laterality: Right;   CYSTOSCOPY/URETEROSCOPY/HOLMIUM LASER/STENT PLACEMENT Left 12/01/2022   Procedure: CYSTOSCOPY/URETEROSCOPY/HOLMIUM LASER/STENT PLACEMENT;  Surgeon: Shona Layman BROCKS, MD;  Location: [facility];  Service: Urology;  Laterality: Left;   EXTRACORPOREAL SHOCK WAVE LITHOTRIPSY Left 08/07/2019   Procedure: EXTRACORPOREAL SHOCK WAVE LITHOTRIPSY (ESWL);  Surgeon: Elisabeth Valli BIRCH, MD;  Location: [facility];  Service: Urology;  Laterality: Left;   EXTRACORPOREAL SHOCK WAVE LITHOTRIPSY Left 04/08/2020   Procedure: EXTRACORPOREAL SHOCK WAVE LITHOTRIPSY (ESWL);  Surgeon: Rosalind Zachary NOVAK, MD;  Location: [facility];  Service: Urology;  Laterality: Left;   EXTRACORPOREAL SHOCK WAVE LITHOTRIPSY Right 12/16/2020   Procedure: EXTRACORPOREAL SHOCK WAVE LITHOTRIPSY (ESWL);  Surgeon: Gaston Hamilton, MD;  Location: [facility];  Service: Urology;  Laterality: Right;   HOLMIUM LASER APPLICATION Left 07/15/2012   Procedure: HOLMIUM LASER APPLICATION;  Surgeon: Toribio Neysa Repine, MD;  Location: [facility];  Service: Urology;  Laterality: Left;   HOLMIUM LASER APPLICATION Right  08/11/2016   Procedure: HOLMIUM LASER APPLICATION;  Surgeon: Mark Ottelin, MD;  Location: [facility];  Service: Urology;  Laterality: Right;   HOLMIUM LASER APPLICATION Right 02/11/2019   Procedure: HOLMIUM LASER APPLICATION;  Surgeon: Nieves Cough, MD;  Location: [facility];  Service: Urology;  Laterality: Right;   IR NEPHROSTOMY PLACEMENT RIGHT  10/16/2022   TONSILLECTOMY  AS CHILD   URETEROSCOPY      Home Medications:  Medications Prior to Admission  Medication Sig Dispense Refill Last Dose/Taking   acetaminophen  (TYLENOL ) 500 MG tablet Take 2 tablets (1,000 mg total) by mouth every 8 (eight) hours as needed for mild pain. 30 tablet 0 11/20/2023 Morning   ARTIFICIAL TEARS 0.1-0.3 % SOLN Place 1 drop into both eyes 3 (three) times daily as needed (for dryness).   Unknown   cetirizine (ZYRTEC ALLERGY) 10 MG tablet Take 10 mg by mouth daily as needed for allergies or rhinitis.   Unknown   Ibuprofen  200 MG CAPS Take 400 mg by mouth every 6 (six) hours as needed (for pain or headaches).   11/19/2023 Evening   levonorgestrel (MIRENA) 20 MCG/24HR IUD 1 each by Intrauterine route once. Implanted November 2019   05/02/2019   meloxicam  (MOBIC ) 15 MG tablet Take 15 mg by mouth daily as needed for pain.   Unknown   ondansetron  (ZOFRAN ) 4 MG tablet Take 4 mg by mouth every 8 (eight) hours as needed for nausea or vomiting.   Unknown   tiZANidine (ZANAFLEX) 4 MG tablet Take 4 mg by mouth 2 (two) times daily as needed for muscle spasms.   Unknown   torsemide (DEMADEX) 5 MG tablet Take 5 mg by mouth daily.   Taking   zonisamide  (ZONEGRAN ) 100 MG capsule Take 100 mg by mouth at bedtime.   11/19/2023 Bedtime   ondansetron  (ZOFRAN -ODT) 4 MG disintegrating tablet Take 1 tablet (4 mg total) by mouth every 8 (eight) hours as needed for nausea or vomiting. (Patient not taking: Reported on 11/20/2023) 20 tablet 0 Not Taking   senna-docusate (SENOKOT-S) 8.6-50 MG  tablet Take 1 tablet by  mouth daily. (Patient not taking: Reported on 11/20/2023) 20 tablet 0 Not Taking   Allergies:  Allergies  Allergen Reactions   Adhesive [Tape] Rash and Other (See Comments)    Pt states paper tape causes rashes, but plastic tape is not problematic    Family History  Problem Relation Age of Onset   Thyroid disease Mother    Depression Mother    Cancer Father        leaukemia   Thyroid disease Brother    Cancer Maternal Grandmother        GI   Diabetes Maternal Grandfather    Heart disease Paternal Grandmother    Diabetes Maternal Aunt    Social History:  reports that she has never smoked. She has never used smokeless tobacco. She reports that she does not currently use alcohol. She reports that she does not use drugs.  ROS: A complete review of systems was performed.  All systems are negative except for pertinent findings as noted. ROS   Physical Exam:  Vital signs in last 24 hours: Temp:  [98.1 F (36.7 C)] 98.1 F (36.7 C) (07/22 1619) Pulse Rate:  [69] 69 (07/22 1619) Resp:  [17] 17 (07/22 1619) BP: (124)/(71) 124/71 (07/22 1619) SpO2:  [100 %] 100 % (07/22 1619) Weight:  [77.1 kg] 77.1 kg (07/22 1619) General:  Alert and oriented, No acute distress HEENT: Normocephalic, atraumatic Neck: No JVD or lymphadenopathy Cardiovascular: Regular rate and rhythm Lungs: Regular rate and effort Abdomen: Soft, nontender, nondistended, no abdominal masses Back: No CVA tenderness Extremities: No edema Neurologic: Grossly intact  Laboratory Data:  Results for orders placed or performed during the hospital encounter of 11/20/23 (from the past 24 hours)  POCT I-Stat EG7     Status: Abnormal   Collection Time: 11/20/23  4:47 PM  Result Value Ref Range   pH, Ven 7.300 7.25 - 7.43   pCO2, Ven 26.1 (L) 44 - 60 mmHg   pO2, Ven 35 32 - 45 mmHg   Bicarbonate 13.0 (L) 20.0 - 28.0 mmol/L   TCO2 14 (L) 22 - 32 mmol/L   O2 Saturation 66 %   Acid-base deficit 12.0 (H) 0.0 - 2.0  mmol/L   Sodium 142 135 - 145 mmol/L   Potassium 3.6 3.5 - 5.1 mmol/L   Calcium, Ion 1.31 1.15 - 1.40 mmol/L   HCT 33.0 (L) 36.0 - 46.0 %   Hemoglobin 11.2 (L) 12.0 - 15.0 g/dL   Patient temperature 63.9 C    Sample type VENOUS    Comment NOTIFIED PHYSICIAN    No results found for this or any previous visit (from the past 240 hours). Creatinine: No results for input(s): CREATININE in the last 168 hours.  CT impression: CT from 11/21/2023: Right right mid ureteral stone with significant right hydronephrosis.  2 left distal ureteral stones with significant upstream hydronephrosis.  IUD present.  No bladder distention or other abnormalities.   Impression/Assessment:  48yo F long-standing history of nephrolithiasis presenting from clinic with bilateral distal ureteral stones.  Due to concerns for worsening kidney function we will plan to do bilateral URS versus bilateral stent.  We discussed risk benefits alternatives to ureteroscopy.  This included bleeding infection and damage to surrounding structures surrounding structures including ureter as well as urethra.  We discussed the need for stent postoperatively as well as the potential symptoms of stent placement.  We discussed possible inability to complete procedure due to caliber of your  ureter or inability to pass stone possibly requiring long-term stent versus nephrostomy tube.  We discussed need for possible second surgery.  Patient voiced their understanding and consent was obtained.   Plan:  Keep NPO plan for [place] URS vs/ bilateral stent   Steffan JAYSON Pea 11/20/2023, 5:42 PM

## 2023-11-20 NOTE — Anesthesia Postprocedure Evaluation (Signed)
 Anesthesia Post Note  Patient: Phyllis House  Procedure(s) Performed: CYSTOSCOPY, WITH RETROGRADE PYELOGRAM AND URETERAL STENT INSERTION, BILATERAL,URETEROSCOPY, LASER LITHOTRIPSY (Bilateral: Ureter)     Patient location during evaluation: PACU Anesthesia Type: General Level of consciousness: awake and alert Pain management: pain level controlled Vital Signs Assessment: post-procedure vital signs reviewed and stable Respiratory status: spontaneous breathing, nonlabored ventilation, respiratory function stable and patient connected to nasal cannula oxygen Cardiovascular status: blood pressure returned to baseline and stable Postop Assessment: no apparent nausea or vomiting Anesthetic complications: no   No notable events documented.  Last Vitals:  Vitals:   11/20/23 2020 11/20/23 2037  BP:  130/78  Pulse: 68 68  Resp: 18 17  Temp:  36.7 C  SpO2: 97% 100%    Last Pain:  Vitals:   11/20/23 2037  TempSrc:   PainSc: 0-No pain                 Cordella P Laurren Lepkowski

## 2023-11-20 NOTE — Anesthesia Preprocedure Evaluation (Addendum)
 Anesthesia Evaluation  Patient identified by MRN, date of birth, ID band Patient awake    Reviewed: Allergy & Precautions, NPO status , Patient's Chart, lab work & pertinent test results  History of Anesthesia Complications (+) PONV and history of anesthetic complications  Airway Mallampati: II  TM Distance: >3 FB Neck ROM: Full   Comment: Previous grade II view with MAC 4 Dental no notable dental hx.    Pulmonary neg pulmonary ROS   Pulmonary exam normal        Cardiovascular negative cardio ROS  Rhythm:Regular Rate:Normal     Neuro/Psych  Headaches PSYCHIATRIC DISORDERS Anxiety Depression       GI/Hepatic negative GI ROS, Neg liver ROS,,,  Endo/Other  negative endocrine ROS    Renal/GU Renal disease (stones)  Female GU complaint     Musculoskeletal negative musculoskeletal ROS (+)    Abdominal Normal abdominal exam  (+)   Peds  Hematology Lab Results      Component                Value               Date                      WBC                      3.8 (L)             12/12/2022                HGB                      11.2 (L)            11/20/2023                HCT                      33.0 (L)            11/20/2023                MCV                      70.5 (L)            12/12/2022                PLT                      172                 12/12/2022              Anesthesia Other Findings   Reproductive/Obstetrics PCOS                              Anesthesia Physical Anesthesia Plan  ASA: 2  Anesthesia Plan: General   Post-op Pain Management: Tylenol  PO (pre-op)*   Induction: Intravenous  PONV Risk Score and Plan: 4 or greater and Ondansetron , Dexamethasone , Midazolam  and Treatment may vary due to age or medical condition  Airway Management Planned: LMA and Mask  Additional Equipment: None  Intra-op Plan:   Post-operative Plan: Extubation in OR  Informed  Consent: I have reviewed the patients History and Physical, chart, labs and discussed the procedure  including the risks, benefits and alternatives for the proposed anesthesia with the patient or authorized representative who has indicated his/her understanding and acceptance.     Dental advisory given  Plan Discussed with: CRNA and Anesthesiologist  Anesthesia Plan Comments: (Risks of general anesthesia discussed including, but not limited to, sore throat, hoarse voice, chipped/damaged teeth, injury to vocal cords, nausea and vomiting, allergic reactions, lung infection, heart attack, stroke, and death. All questions answered. )         Anesthesia Quick Evaluation

## 2023-11-20 NOTE — Anesthesia Procedure Notes (Signed)
 Procedure Name: LMA Insertion Date/Time: 11/20/2023 6:09 PM  Performed by: Cena Epps, CRNAPre-anesthesia Checklist: Patient identified, Emergency Drugs available, Suction available and Patient being monitored Patient Re-evaluated:Patient Re-evaluated prior to induction Oxygen Delivery Method: Circle System Utilized Preoxygenation: Pre-oxygenation with 100% oxygen Induction Type: IV induction Ventilation: Mask ventilation without difficulty LMA: LMA inserted LMA Size: 4.0 Number of attempts: 1 Airway Equipment and Method: Bite block Placement Confirmation: positive ETCO2 Tube secured with: Tape Dental Injury: Teeth and Oropharynx as per pre-operative assessment

## 2023-11-20 NOTE — Transfer of Care (Signed)
 Immediate Anesthesia Transfer of Care Note  Patient: Phyllis House  Procedure(s) Performed: CYSTOSCOPY, WITH RETROGRADE PYELOGRAM AND URETERAL STENT INSERTION, BILATERAL,URETEROSCOPY, LASER LITHOTRIPSY (Bilateral: Ureter)  Patient Location: PACU  Anesthesia Type:General  Level of Consciousness: awake, alert , and oriented  Airway & Oxygen Therapy: Patient Spontanous Breathing and Patient connected to face mask oxygen  Post-op Assessment: Report given to RN and Post -op Vital signs reviewed and stable  Post vital signs: Reviewed and stable  Last Vitals:  Vitals Value Taken Time  BP 129/77 11/20/23 19:17  Temp    Pulse 65 11/20/23 19:18  Resp 15 11/20/23 19:18  SpO2 98 % 11/20/23 19:18  Vitals shown include unfiled device data.  Last Pain:  Vitals:   11/20/23 1700  TempSrc:   PainSc: 0-No pain      Patients Stated Pain Goal: 4 (11/20/23 1700)  Complications: No notable events documented.

## 2023-11-20 NOTE — Op Note (Signed)
 Preoperative diagnosis: bilateral ureteral calculus  Postoperative diagnosis: bilateral ureteral calculus  Procedure:  Cystoscopy bilateral ureteroscopy and stone removal Ureteroscopic laser lithotripsy bilateral 71F x 24 ureteral stent placement no strings  bilateral retrograde pyelography with interpretation  Surgeon: Dr. Steffan Pea  Anesthesia: General  Complications: None  Operative findings: Bilateral stones removed without complication 6 x 24 double-J stent without strings and placed in bilateral ureters Left ureter with some inflammation and edema made decision to place stent without strings.  Bilateral retrograde pyelogram findings: bilateral retrograde pyelography demonstrated no ureteral defects, no contrast extravasation no significant narrowing of the ureter.  EBL: Minimal  Specimens:  ureteral calculus  Disposition of specimens: [facility] for stone analysis  Indication: Phyllis House is a 48 y.o.   patient presented to clinic today with flank pain CT scan demonstrated bilateral  ureteral stones and associated symptoms.  Due to bilateral stones she was sent for bilateral stent placement.. After reviewing the management options for treatment, the patient elected to proceed with the above surgical procedure(s). We have discussed the potential benefits and risks of the procedure, side effects of the proposed treatment, the likelihood of the patient achieving the goals of the procedure, and any potential problems that might occur during the procedure or recuperation. Informed consent has been obtained.   Description of procedure:  The patient was taken to the operating room and general anesthesia was induced.  The patient was placed in the dorsal lithotomy position, prepped and draped in the usual sterile fashion, and preoperative antibiotics were administered. A preoperative time-out was performed.   Cystourethroscopy was performed.  The  patient's urethra was examined and was normal.  Normal bladder.  There was no evidence for any bladder tumors, stones, or other mucosal pathology.    Attention then turned to the left ureteral orifice and a ureteral catheter was used to intubate the ureteral orifice.  Omnipaque  contrast was injected through the ureteral catheter and a retrograde pyelogram was performed with findings as dictated above.  A 0.38 sensor guidewire was then advanced up the left ureter into the renal pelvis under fluoroscopic guidance. The 4.5 Fr semirigid ureteroscope was then advanced into the ureter next to the guidewire and the calculus was identified.   The stone was then fragmented with the 200 micron holmium laser fiber on a setting of 0.8 and frequency of 8 Hz.   All stones were then removed from the ureter with an N-gage nitinol basket.  Reinspection of the ureter revealed no remaining visible stones or fragments.   The same exact procedure was done on the right ureter where the stone was removed.  This was done in the same exact fashion.  Each wire was then backloaded through the cystoscope and a ureteral stent was advance over the wire using Seldinger technique.  The stent was positioned appropriately under fluoroscopic and cystoscopic guidance.  The wire was then removed with an adequate stent curl noted in the renal pelvis as well as in the bladder.  This procedure was done on both sides.  The bladder was then emptied and the procedure ended.  The patient appeared to tolerate the procedure well and without complications.  The patient was able to be awakened and transferred to the recovery unit in satisfactory condition.   Disposition: The patient will be scheduled for stent removal in 5-7 days in our clinic.

## 2023-11-21 ENCOUNTER — Encounter (HOSPITAL_COMMUNITY): Payer: Self-pay | Admitting: Urology

## 2023-11-21 LAB — POCT PREGNANCY, URINE: Preg Test, Ur: NEGATIVE

## 2023-11-26 LAB — SURGICAL PATHOLOGY

## 2023-12-08 LAB — STONE ANALYSIS
Calcium Phosphate (Hydroxyl): 90 %
Weight Calculi: 14 mg
